# Patient Record
Sex: Female | Born: 1935
Health system: Southern US, Community
[De-identification: ages and names within clinical notes are randomized; demographics above are authoritative.]

## PROBLEM LIST (undated history)

## (undated) DIAGNOSIS — E7800 Pure hypercholesterolemia, unspecified: Secondary | ICD-10-CM

## (undated) DIAGNOSIS — J329 Chronic sinusitis, unspecified: Secondary | ICD-10-CM

## (undated) DIAGNOSIS — M549 Dorsalgia, unspecified: Secondary | ICD-10-CM

## (undated) DIAGNOSIS — Z87898 Personal history of other specified conditions: Secondary | ICD-10-CM

## (undated) DIAGNOSIS — G43909 Migraine, unspecified, not intractable, without status migrainosus: Secondary | ICD-10-CM

## (undated) DIAGNOSIS — M810 Age-related osteoporosis without current pathological fracture: Secondary | ICD-10-CM

## (undated) DIAGNOSIS — T7840XA Allergy, unspecified, initial encounter: Secondary | ICD-10-CM

## (undated) DIAGNOSIS — G8929 Other chronic pain: Secondary | ICD-10-CM

## (undated) DIAGNOSIS — F419 Anxiety disorder, unspecified: Secondary | ICD-10-CM

## (undated) DIAGNOSIS — I10 Essential (primary) hypertension: Secondary | ICD-10-CM

## (undated) DIAGNOSIS — J189 Pneumonia, unspecified organism: Secondary | ICD-10-CM

## (undated) HISTORY — PX: TONSILLECTOMY: SUR1361

## (undated) HISTORY — PX: HEMORRHOID SURGERY: SHX153

## (undated) HISTORY — PX: APPENDECTOMY: SHX54

## (undated) HISTORY — DX: Chronic sinusitis, unspecified: J32.9

## (undated) HISTORY — PX: ABDOMINAL HYSTERECTOMY: SHX81

## (undated) HISTORY — DX: Age-related osteoporosis without current pathological fracture: M81.0

## (undated) HISTORY — DX: Allergy, unspecified, initial encounter: T78.40XA

## (undated) HISTORY — PX: BACK SURGERY: SHX140

## (undated) HISTORY — DX: Migraine, unspecified, not intractable, without status migrainosus: G43.909

---

## 2000-10-11 HISTORY — PX: BREAST BIOPSY: SHX20

## 2004-11-06 ENCOUNTER — Ambulatory Visit: Payer: Self-pay | Admitting: Internal Medicine

## 2005-08-04 ENCOUNTER — Ambulatory Visit: Payer: Self-pay | Admitting: Internal Medicine

## 2005-11-15 ENCOUNTER — Emergency Department: Payer: Self-pay | Admitting: Emergency Medicine

## 2005-11-15 ENCOUNTER — Other Ambulatory Visit: Payer: Self-pay

## 2006-01-06 ENCOUNTER — Ambulatory Visit: Payer: Self-pay | Admitting: Pain Medicine

## 2006-01-25 ENCOUNTER — Emergency Department: Payer: Self-pay | Admitting: Emergency Medicine

## 2006-02-07 ENCOUNTER — Ambulatory Visit: Payer: Self-pay | Admitting: Pain Medicine

## 2006-03-17 ENCOUNTER — Ambulatory Visit: Payer: Self-pay | Admitting: Pain Medicine

## 2006-03-23 ENCOUNTER — Ambulatory Visit: Payer: Self-pay | Admitting: Pain Medicine

## 2006-05-03 ENCOUNTER — Ambulatory Visit: Payer: Self-pay | Admitting: Pain Medicine

## 2006-06-21 ENCOUNTER — Ambulatory Visit: Payer: Self-pay | Admitting: Unknown Physician Specialty

## 2006-10-11 HISTORY — PX: BACK SURGERY: SHX140

## 2006-10-20 ENCOUNTER — Ambulatory Visit: Payer: Self-pay | Admitting: Internal Medicine

## 2006-10-28 ENCOUNTER — Ambulatory Visit: Payer: Self-pay | Admitting: Internal Medicine

## 2006-10-31 ENCOUNTER — Other Ambulatory Visit: Payer: Self-pay

## 2006-12-06 ENCOUNTER — Ambulatory Visit: Payer: Self-pay | Admitting: Unknown Physician Specialty

## 2007-03-06 ENCOUNTER — Emergency Department: Payer: Self-pay | Admitting: Internal Medicine

## 2007-03-11 ENCOUNTER — Emergency Department: Payer: Self-pay | Admitting: Emergency Medicine

## 2007-03-13 ENCOUNTER — Inpatient Hospital Stay: Payer: Self-pay | Admitting: Internal Medicine

## 2007-03-19 ENCOUNTER — Emergency Department: Payer: Self-pay | Admitting: Emergency Medicine

## 2007-03-24 ENCOUNTER — Ambulatory Visit: Payer: Self-pay | Admitting: Neurosurgery

## 2007-03-29 ENCOUNTER — Inpatient Hospital Stay (HOSPITAL_COMMUNITY): Admission: RE | Admit: 2007-03-29 | Discharge: 2007-04-04 | Payer: Self-pay | Admitting: Neurosurgery

## 2007-04-04 ENCOUNTER — Encounter: Payer: Self-pay | Admitting: Internal Medicine

## 2007-04-11 ENCOUNTER — Encounter: Payer: Self-pay | Admitting: Internal Medicine

## 2007-08-02 ENCOUNTER — Ambulatory Visit: Payer: Self-pay | Admitting: Internal Medicine

## 2007-11-21 ENCOUNTER — Ambulatory Visit: Payer: Self-pay | Admitting: Internal Medicine

## 2007-12-07 ENCOUNTER — Ambulatory Visit: Payer: Self-pay | Admitting: Neurosurgery

## 2008-02-29 ENCOUNTER — Emergency Department: Payer: Self-pay | Admitting: Emergency Medicine

## 2008-02-29 ENCOUNTER — Other Ambulatory Visit: Payer: Self-pay

## 2008-03-02 ENCOUNTER — Emergency Department: Payer: Self-pay | Admitting: Emergency Medicine

## 2008-03-02 ENCOUNTER — Other Ambulatory Visit: Payer: Self-pay

## 2008-03-04 ENCOUNTER — Ambulatory Visit: Payer: Self-pay | Admitting: Emergency Medicine

## 2008-03-13 ENCOUNTER — Inpatient Hospital Stay (HOSPITAL_COMMUNITY): Admission: RE | Admit: 2008-03-13 | Discharge: 2008-03-16 | Payer: Self-pay | Admitting: Neurosurgery

## 2008-05-22 ENCOUNTER — Ambulatory Visit: Payer: Self-pay | Admitting: Internal Medicine

## 2008-12-12 ENCOUNTER — Ambulatory Visit: Payer: Self-pay | Admitting: Internal Medicine

## 2009-03-03 ENCOUNTER — Ambulatory Visit: Payer: Self-pay | Admitting: Internal Medicine

## 2009-03-06 ENCOUNTER — Ambulatory Visit: Payer: Self-pay | Admitting: Internal Medicine

## 2009-08-24 ENCOUNTER — Emergency Department: Payer: Self-pay | Admitting: Emergency Medicine

## 2009-09-19 ENCOUNTER — Emergency Department: Payer: Self-pay

## 2009-12-17 ENCOUNTER — Ambulatory Visit: Payer: Self-pay | Admitting: Internal Medicine

## 2010-09-24 ENCOUNTER — Ambulatory Visit: Payer: Self-pay | Admitting: Neurosurgery

## 2010-11-01 ENCOUNTER — Encounter: Payer: Self-pay | Admitting: Neurosurgery

## 2010-12-29 ENCOUNTER — Ambulatory Visit: Payer: Self-pay

## 2011-02-23 NOTE — Op Note (Signed)
NAME:  [name], [name]              ACCOUNT NO.:  [id number]   MEDICAL RECORD NO.:  [id number]          PATIENT TYPE:  INP   LOCATION:  3315                         FACILITY:  [facility]   PHYSICIAN:  Payton Doughty, M.D.      DATE OF BIRTH:  [birth year]   DATE OF PROCEDURE:  03/13/2008  DATE OF DISCHARGE:                               OPERATIVE REPORT   PREOPERATIVE DIAGNOSIS:  Loosened screws at L3, cutout screw at S1.   POSTOPERATIVE DIAGNOSIS:  Loosened screws at L3, cutout screw at S1.   PROCEDURE:  Revision of L3 and S1 fusion.   DICTATING DOCTOR:  Payton Doughty, MD, Neurosurgery.   ANESTHESIA:  General endotracheal.   PREPARATION:  Prepped and draped with alcohol wipe.   COMPLICATIONS:  None.   ASSISTANT:  Evon Slack.   BODY OF TEXT:  This is a 77 year old lady who was fused a year ago, did  well and has had loosening of screws, taken to the operating room,  smoothly anesthetized and intubated, placed prone on the operating  table.  Following shave, prep and draped in the usual sterile fashion,  the old skin incision was reopened and the hardware dissected free.  Caps were removed as were the rods and each screw inspected  individually, both L3 screws were loose.  They were four 75 x 40 screws.  The old screws were removed and the holes were retapped with a larger  screw and also a longer screw and replaced with six 75 x 45 screws.  On  the right-sided S1, the screw had broken into the disk space.  That  screw was removed, new trajectory was planned, and five 75 x 40 screw  was placed there.  They were reconnected to the rods.  The transverse  process of L3-L4 were relocated and first BMP on the extender matrix was  placed bilaterally after decortication.  Final x-ray showed good  placement of screws and rods.  Successive layers of 0 Vicryl, 2-0  Vicryl, and 3-0 nylon were used to close.  Betadine and Telfa dressing  was applied and made occlusive to the OpSite.  The patient  returned to  the recovery room in good condition.           ______________________________  Payton Doughty, M.D.     MWR/MEDQ  D:  03/13/2008  T:  03/14/2008  Job:  [id number]

## 2011-02-23 NOTE — Op Note (Signed)
NAMESEANNE, [name]              ACCOUNT NO.:  [id number]   MEDICAL RECORD NO.:  [id number]          PATIENT TYPE:  INP   LOCATION:  3172                         FACILITY:  [facility]   PHYSICIAN:  Payton Doughty, M.D.      DATE OF BIRTH:  01-02-1925   DATE OF PROCEDURE:  03/29/2007  DATE OF DISCHARGE:                               OPERATIVE REPORT   PREOPERATIVE DIAGNOSIS:  Spondylosis, right L4-5, L4-L5 radiculopathy.   POSTOPERATIVE DIAGNOSIS:  Spondylosis, right L4-5, L4-L5 radiculopathy.   OPERATIVE PROCEDURE:  L3-4, L4-5 laminectomy, facetectomy, L3-S1  segmental pedicle screw fixation and posterolateral arthrodesis.   SURGEON:  Payton Doughty, M.D.   ANESTHESIA:  General endotracheal.   PREP:  Sterile Betadine prep and scrub with alcohol wipe.   COMPLICATIONS:  None.   NURSE ASSISTANT:  [place].   DOCTOR ASSISTANT:  Hilda Lias, M.D.   BODY OF TEXT:  This is a 77 year old lady with severe right L4 and L5  radiculopathies related to spondylosis.  She is taken to operating room,  smoothly anesthetized, intubated, placed prone on the operating table.  Following shave, prep, drape in usual sterile fashion skin was incised  from mid L2-S1 and the lamina and transverse processes of L3, L4, L5 and  the sacral ala were exposed bilaterally in subperiosteal plane.  Intraoperative x-ray confirmed correctness level.  Having confirmed  correctness level, the pars interarticularis, lamina and inferior facet  of L3 and L4 were removed.  Because of the poor quality of the patient's  of bone and desire not to take down something that was not absolutely  necessary for decompression, the lamina of L5 was left largely intact  except on the right side where it was partly taken down in the superior  end to complete the decompression at L4-5 on the right side.  There was  significant nerve root compression at 3-4 and 4-5 much worse on the  right, the most severely affected root was a right L4  root.  Following  complete decompression of the nerve roots, it was not feasible to place  interbody devices because of the patient's anatomy.  Pedicle screws were  placed at L3, L4, L5 and S1.  This was done bilaterally.  Intraoperative  x-ray showed good placement of screws.  The rods contoured to place over  them.  They were contoured and placed on them with a good fit, locked  down with the locking caps.  The final tightener was applied.  The  transverse process of the 3, 4, 5 and the sacral ala were decorticated  with a high-speed drill packed with BMP  on the Actifuse extender matrix.  Final x-ray showed good placement of  screws and rods.  The successive layers of 0-0 Vicryl, 2-0 Vicryl, 3-0  nylon were used to close.  Betadine Telfa dressing was applied and made  occlusive with OpSite.  The patient returned recovery room in good  condition.           ______________________________  Payton Doughty, M.D.     MWR/MEDQ  D:  03/29/2007  T:  03/30/2007  Job:  [id number]

## 2011-02-23 NOTE — H&P (Signed)
[name], [name]              ACCOUNT NO.:  [id number]   MEDICAL RECORD NO.:  [id number]           PATIENT TYPE:   LOCATION:                                 FACILITY:   PHYSICIAN:  Payton Doughty, M.D.      DATE OF BIRTH:  01/12/1933   DATE OF ADMISSION:  03/13/2008  DATE OF DISCHARGE:                              HISTORY & PHYSICAL   CURRENT ADMITTING DIAGNOSIS:  Cut out screw in a fusion.   BODY TEXT:  This is a very nice 77 year old right-handed white lady who  a year ago underwent a fusion from L3-S1, done well, then developed  increasing pain in back and down her right leg.  A CT scanning shows cut  out of a screw at S1 into disk space and some loosening of L3 screws.  She is here for revision.   MEDICAL HISTORY:  Benign.   MEDICATIONS:  She is on:  1. Alprazolam.  2. Prevacid.  3. Evista.  4. Darvocet.  5. Caltrate.   ALLERGIES:  She is allergic to PENICILLIN, SULFA, MORPHINE and DILAUDID  which gives her headache.   SOCIAL HISTORY:  She does not smoke.  She does not drink.  Lives in her  own home.   SURGICAL HISTORY:  1. Hysterectomy in 1966.  2. Hemorrhoids in the remote past.   FAMILY HISTORY:  Not given.   REVIEW OF SYSTEMS:  Remarkable for wearing glasses, hearing aid, nasal  congestion, sinus headache, leg pain, indigestion, gastritis, UTIs,  incontinence, leg weakness, back pain, leg pain, arthritis, neck pain,  anxiety, and depression.   PHYSICAL EXAMINATION:  HEENT: Normal limits.  NECK: She has good range of motion.  CHEST: Clear.  CARDIAC: Regular rate and rhythm with 1/6 murmur.  ABDOMEN: Nontender with no hepatosplenomegaly.  EXTREMITIES: Without clubbing, or cyanosis.  Peripheral pulses are good.  GU: Deferred.  NEUROLOGIC:  She is awake, alert, and oriented.  Cranial nerves are  intact except for hearing loss.  Motor exam shows 5/5 strength  throughout the upper and lower extremities.  Pain described is in the  right S1 distribution.   Reflexes are absent at the knees and ankles.   CT results have been reviewed above.   CLINICAL IMPRESSION:  Displaced screw with the right-sided S1 and in the  left side at L2-L3 there is a pedicle fracture.   PLAN:  For revision and fusion with placement of larger screws at L3  bilaterally and a redirected screw at S1 on the right.  The risks and  benefits have been discussed with her and she wishes to proceed.           ______________________________  Payton Doughty, M.D.     MWR/MEDQ  D:  03/13/2008  T:  03/13/2008  Job:  [id number]

## 2011-02-23 NOTE — H&P (Signed)
NAMEKEYANNI, [name]              ACCOUNT NO.:  [id number]   MEDICAL RECORD NO.:  [id number]          PATIENT TYPE:  INP   LOCATION:  2852                         FACILITY:  [facility]   PHYSICIAN:  Payton Doughty, M.D.      DATE OF BIRTH:  [birth year]   DATE OF ADMISSION:  03/29/2007  DATE OF DISCHARGE:                              HISTORY & PHYSICAL   ADMITTING DIAGNOSIS:  Spondylosis with critical spinal stenosis at L3-4  and L4-5.   This is a very nice 77 year old right-handed white lady who has had back  pain for some time.  Over the past couple months, she had a marked  increase in pain down her right leg.  MR shows severe stenosis at 75/4.  She underwent injection.  It did not help much.  I saw her my office,  and she is now being admitted for laminectomy and diskectomy infusion.   MEDICAL HISTORY:  Benign.   MEDICATIONS:  1. Alprazolam 0.5 mg 2-3 times a day on a p.r.n. basis.  2. Prevacid 30 mg a day.  3. Evista 60 mg a day.  4. Darvocet on a p.r.n. basis.  5. She uses Caltrate and __________ .   ALLERGIES:  1. PENICILLIN.  2. SULFUR.  3. MORPHINE gives her a headache.  4. DILAUDID gives her a headache.   SOCIAL HISTORY:  She does not smoke, does not drink and lives in her own  home.   SURGICAL HISTORY:  1. Hysterectomy in 1966.  2. Hemorrhoids in the remote.   FAMILY HISTORY:  Not given.   REVIEW OF SYSTEMS:  Marked for wearing glasses, hearing aid, nasal  congestion, sinus headache, leg pain, indigestion, gastritis, UTIs,  incontinence, leg weakness, back pain, leg pain, arthritis, neck pain,  anxiety and depression.   PHYSICAL EXAMINATION:  HEENT:  Within normal limits.  NECK:  She has reasonable range of motion.  CHEST:  Clear.  CARDIAC:  Regular rate and rhythm with a 1/6 murmur.  ABDOMEN:  Nontender with no hepatosplenomegaly.  EXTREMITIES:  Without clubbing or cyanosis.  GI:  Deferred.  EXTREMITIES:  Peripheral pulses are good.  NEUROLOGICALLY:  She is  awake, alert and oriented.  Cranial nerves are  intact save for hearing loss.  Motor exam shows 5/5 strength throughout  in the left lower extremity.  In the right lower extremity she has pain-  related weakness in the knee extensors, knee flexors, dorsi and plantar  flexors on the right and straight leg raise very positive on the right.   MR shows spondylolisthesis of 3-4 with severe spinal stenosis; 4-5 is  also very tight; 5-1 is not too back.   CLINICAL IMPRESSION:  Right L4 and right L5 radiculopathy related to  critical spinal stenosis in 3-4.   PLAN:  The plan is for laminectomy, diskectomy, posterior lumbar  __________  fusion and pedicle screws at 3-4, 4-5 and 5-1.  The risks  and benefits have been discussed with her, and she wishes to proceed.   MR shows spondylosis of 304 severe spinal stenosis 04/05 is also retired  05/01 signs of  clinical impression is right L4-L5 radiculopathy related  to critical spinal stenosis 03/04 the plan is for a laminectomy  diskectomy posterior lumbar by fusion with pedicle screws at [id number]  risks and benefits have been discussed with her.  She wishes proceed.           ______________________________  Payton Doughty, M.D.     MWR/MEDQ  D:  03/29/2007  T:  03/29/2007  Job:  [id number]

## 2011-02-23 NOTE — Discharge Summary (Signed)
NAMEESMERELDA, [name]              ACCOUNT NO.:  [id number]   MEDICAL RECORD NO.:  [id number]          PATIENT TYPE:  INP   LOCATION:  3006                         FACILITY:  [facility]   PHYSICIAN:  [name], M.D.      DATE OF BIRTH:  Apr 14, 1925   DATE OF ADMISSION:  03/29/2007  DATE OF DISCHARGE:  04/04/2007                               DISCHARGE SUMMARY   ADMITTING DIAGNOSIS:  Spondylosis L3-4, L4-5.   DISCHARGE DIAGNOSIS:  Spondylosis L3-4, L4-5.   PROCEDURES:  L3-4, L4-5, L5-S1 laminectomy and segmental pedicle screw  fixation and posterolateral arthrodesis.   SURGEON:  Dr. Channing Mutters   COMPLICATIONS:  None.   DISCHARGE STATUS:  Alive and well.   This is a 77 year old right-handed white lady whose history and physical  is recounted in the chart.  She has had over the past couple of months  marked increased pain down her right leg.  MR showed stenosis at 3-4,  injection did not help, and she was admitted for laminectomy and fusion.   MEDICAL HISTORY:  Benign.   MEDICATIONS:  Alprazolam, Prevacid, Evista and Darvocet.   ALLERGIES:  PENICILLIN, SULFA, MORPHINE AND DILAUDID.   SOCIAL HISTORY:  She does not smoke or drink.   SURGICAL HISTORY:  Hysterectomy in 1966, hemorrhoids in the remote past.   FAMILY HISTORY:  Not given.   REVIEW OF SYSTEMS:  Remarkable for back pain and leg pain.   HEENT EXAM:  Within normal limits with reasonable range of motion.  CHEST:  Clear.  CARDIAC EXAM:  Regular rate and rhythm with 1/6 murmur.  ABDOMEN:  Nontender.  No hepatosplenomegaly.  EXTREMITIES:  Without clubbing or cyanosis.  NEUROLOGICALLY:  She is awake, alert and oriented.  Cranial nerves are  intact save for hearing loss.  Motor exam showed 2/5 strength in the  left lower extremity.  In the right lower extremity she had pain-related  weakness.  Knee extensors, knee flexors, dorsal and plantar flexors on  the right and straight leg is very positive on the right.   MR showed  spondylolisthesis 3-4, severe spinal stenosis 4-5 is tight.   CLINICAL IMPRESSION:  Right L4-L5 radiculopathy related to critical  spinal stenosis.   She was admitted after ascertaining normal laboratory values, underwent  a fusion at 3-4, 4-5 and 5-1.  Postoperatively, she has done well with  intact strength.  Says her leg feels better save for a little bit of  right hip pain.  Her incision is dry and well healing.  She has some  trouble getting up and down and getting her brace off and on by herself.  Her Foley is out and she is being discharged to extended-care facility  for further rehab.   On discharge, her medications are:  1. Milk of mag.  2. Colace.  3. Ativan  1 mg t.i.d.  4. Protonix 40 mg a day.  5. Vicodin on a p.r.n. basis.  6. Ciprofloxacin.   Her followup is to be via phone call to my office to arrange for suture  removal next week.  ______________________________  [name], M.D.     MWR/MEDQ  D:  04/03/2007  T:  04/03/2007  Job:  [id number]

## 2011-03-09 ENCOUNTER — Ambulatory Visit: Payer: Self-pay

## 2011-03-11 ENCOUNTER — Ambulatory Visit: Payer: Self-pay

## 2011-04-19 ENCOUNTER — Ambulatory Visit: Payer: Self-pay | Admitting: Surgery

## 2011-04-21 LAB — PATHOLOGY REPORT

## 2011-07-07 LAB — DIFFERENTIAL
Basophils Absolute: 0
Eosinophils Relative: 1
Lymphocytes Relative: 24
Lymphs Abs: 2
Monocytes Absolute: 0.7
Monocytes Relative: 8
Neutro Abs: 5.4

## 2011-07-07 LAB — URINE MICROSCOPIC-ADD ON

## 2011-07-07 LAB — COMPREHENSIVE METABOLIC PANEL
ALT: 22
BUN: 13
CO2: 27
Calcium: 10
Creatinine, Ser: 0.66
GFR calc non Af Amer: >60
Glucose, Bld: 88
Sodium: 133 — ABNORMAL LOW

## 2011-07-07 LAB — CBC
HCT: 33.7 — ABNORMAL LOW
Hemoglobin: 10.9 — ABNORMAL LOW
MCHC: 32.3
MCV: 74.7 — ABNORMAL LOW
RBC: 4.51

## 2011-07-07 LAB — URINALYSIS, ROUTINE W REFLEX MICROSCOPIC
Glucose, UA: NEGATIVE
Ketones, ur: NEGATIVE
Nitrite: NEGATIVE
pH: 7.5

## 2011-07-07 LAB — TYPE AND SCREEN

## 2011-07-07 LAB — APTT: aPTT: 25

## 2011-07-28 LAB — TYPE AND SCREEN
ABO/RH(D): A POS
Antibody Screen: NEGATIVE

## 2011-07-28 LAB — CBC
HCT: 34.1 — ABNORMAL LOW
Hemoglobin: 11 — ABNORMAL LOW
MCV: 72.8 — ABNORMAL LOW
MCV: 74 — ABNORMAL LOW
Platelets: 247
WBC: 11.7 — ABNORMAL HIGH
WBC: 5.8

## 2011-07-28 LAB — URINALYSIS, ROUTINE W REFLEX MICROSCOPIC
Bilirubin Urine: NEGATIVE
Hgb urine dipstick: NEGATIVE
Ketones, ur: NEGATIVE
Protein, ur: NEGATIVE
Urobilinogen, UA: 0.2

## 2011-07-28 LAB — COMPREHENSIVE METABOLIC PANEL
Alkaline Phosphatase: 66
BUN: 9
Chloride: 98
Creatinine, Ser: 0.79
GFR calc non Af Amer: >60
Glucose, Bld: 100 — ABNORMAL HIGH
Potassium: 3.5
Total Bilirubin: 0.5

## 2011-07-28 LAB — ABO/RH: ABO/RH(D): A POS

## 2011-07-28 LAB — APTT: aPTT: 27

## 2011-07-28 LAB — URINE MICROSCOPIC-ADD ON

## 2011-07-28 LAB — PROTIME-INR
INR: 1
Prothrombin Time: 13

## 2011-11-25 DIAGNOSIS — M545 Low back pain: Secondary | ICD-10-CM | POA: Diagnosis not present

## 2011-11-25 DIAGNOSIS — I10 Essential (primary) hypertension: Secondary | ICD-10-CM | POA: Diagnosis not present

## 2011-11-25 DIAGNOSIS — F411 Generalized anxiety disorder: Secondary | ICD-10-CM | POA: Diagnosis not present

## 2011-11-25 DIAGNOSIS — R079 Chest pain, unspecified: Secondary | ICD-10-CM | POA: Diagnosis not present

## 2011-12-09 DIAGNOSIS — M47817 Spondylosis without myelopathy or radiculopathy, lumbosacral region: Secondary | ICD-10-CM | POA: Diagnosis not present

## 2011-12-10 DIAGNOSIS — R079 Chest pain, unspecified: Secondary | ICD-10-CM | POA: Diagnosis not present

## 2011-12-27 DIAGNOSIS — I43 Cardiomyopathy in diseases classified elsewhere: Secondary | ICD-10-CM | POA: Diagnosis not present

## 2011-12-27 DIAGNOSIS — M25579 Pain in unspecified ankle and joints of unspecified foot: Secondary | ICD-10-CM | POA: Diagnosis not present

## 2011-12-27 DIAGNOSIS — R079 Chest pain, unspecified: Secondary | ICD-10-CM | POA: Diagnosis not present

## 2011-12-27 DIAGNOSIS — F411 Generalized anxiety disorder: Secondary | ICD-10-CM | POA: Diagnosis not present

## 2011-12-28 DIAGNOSIS — S52539A Colles' fracture of unspecified radius, initial encounter for closed fracture: Secondary | ICD-10-CM | POA: Diagnosis not present

## 2011-12-28 DIAGNOSIS — L600 Ingrowing nail: Secondary | ICD-10-CM | POA: Diagnosis not present

## 2012-01-18 DIAGNOSIS — I83893 Varicose veins of bilateral lower extremities with other complications: Secondary | ICD-10-CM | POA: Diagnosis not present

## 2012-01-18 DIAGNOSIS — R21 Rash and other nonspecific skin eruption: Secondary | ICD-10-CM | POA: Diagnosis not present

## 2012-01-18 DIAGNOSIS — K219 Gastro-esophageal reflux disease without esophagitis: Secondary | ICD-10-CM | POA: Diagnosis not present

## 2012-01-18 DIAGNOSIS — L03119 Cellulitis of unspecified part of limb: Secondary | ICD-10-CM | POA: Diagnosis not present

## 2012-01-18 DIAGNOSIS — I10 Essential (primary) hypertension: Secondary | ICD-10-CM | POA: Diagnosis not present

## 2012-01-18 DIAGNOSIS — E039 Hypothyroidism, unspecified: Secondary | ICD-10-CM | POA: Diagnosis not present

## 2012-01-18 DIAGNOSIS — F411 Generalized anxiety disorder: Secondary | ICD-10-CM | POA: Diagnosis not present

## 2012-01-18 DIAGNOSIS — L02619 Cutaneous abscess of unspecified foot: Secondary | ICD-10-CM | POA: Diagnosis not present

## 2012-03-16 DIAGNOSIS — M47817 Spondylosis without myelopathy or radiculopathy, lumbosacral region: Secondary | ICD-10-CM | POA: Diagnosis not present

## 2012-03-16 DIAGNOSIS — S22009A Unspecified fracture of unspecified thoracic vertebra, initial encounter for closed fracture: Secondary | ICD-10-CM | POA: Diagnosis not present

## 2012-04-04 DIAGNOSIS — J019 Acute sinusitis, unspecified: Secondary | ICD-10-CM | POA: Diagnosis not present

## 2012-04-04 DIAGNOSIS — R5383 Other fatigue: Secondary | ICD-10-CM | POA: Diagnosis not present

## 2012-04-04 DIAGNOSIS — I10 Essential (primary) hypertension: Secondary | ICD-10-CM | POA: Diagnosis not present

## 2012-04-04 DIAGNOSIS — J309 Allergic rhinitis, unspecified: Secondary | ICD-10-CM | POA: Diagnosis not present

## 2012-04-04 DIAGNOSIS — IMO0001 Reserved for inherently not codable concepts without codable children: Secondary | ICD-10-CM | POA: Diagnosis not present

## 2012-04-04 DIAGNOSIS — N3020 Other chronic cystitis without hematuria: Secondary | ICD-10-CM | POA: Diagnosis not present

## 2012-04-04 DIAGNOSIS — R5381 Other malaise: Secondary | ICD-10-CM | POA: Diagnosis not present

## 2012-04-07 DIAGNOSIS — IMO0001 Reserved for inherently not codable concepts without codable children: Secondary | ICD-10-CM | POA: Diagnosis not present

## 2012-04-07 DIAGNOSIS — R5381 Other malaise: Secondary | ICD-10-CM | POA: Diagnosis not present

## 2012-04-07 DIAGNOSIS — J069 Acute upper respiratory infection, unspecified: Secondary | ICD-10-CM | POA: Diagnosis not present

## 2012-04-07 DIAGNOSIS — R5383 Other fatigue: Secondary | ICD-10-CM | POA: Diagnosis not present

## 2012-04-07 DIAGNOSIS — E039 Hypothyroidism, unspecified: Secondary | ICD-10-CM | POA: Diagnosis not present

## 2012-04-07 DIAGNOSIS — M064 Inflammatory polyarthropathy: Secondary | ICD-10-CM | POA: Diagnosis not present

## 2012-04-07 DIAGNOSIS — D518 Other vitamin B12 deficiency anemias: Secondary | ICD-10-CM | POA: Diagnosis not present

## 2012-04-25 DIAGNOSIS — B009 Herpesviral infection, unspecified: Secondary | ICD-10-CM | POA: Diagnosis not present

## 2012-04-25 DIAGNOSIS — R5381 Other malaise: Secondary | ICD-10-CM | POA: Diagnosis not present

## 2012-04-25 DIAGNOSIS — F411 Generalized anxiety disorder: Secondary | ICD-10-CM | POA: Diagnosis not present

## 2012-04-25 DIAGNOSIS — IMO0001 Reserved for inherently not codable concepts without codable children: Secondary | ICD-10-CM | POA: Diagnosis not present

## 2012-04-25 DIAGNOSIS — I10 Essential (primary) hypertension: Secondary | ICD-10-CM | POA: Diagnosis not present

## 2012-04-27 DIAGNOSIS — R5381 Other malaise: Secondary | ICD-10-CM | POA: Diagnosis not present

## 2012-05-16 ENCOUNTER — Ambulatory Visit: Payer: Self-pay

## 2012-05-16 DIAGNOSIS — Z1231 Encounter for screening mammogram for malignant neoplasm of breast: Secondary | ICD-10-CM | POA: Diagnosis not present

## 2012-05-31 ENCOUNTER — Ambulatory Visit: Payer: Self-pay | Admitting: Internal Medicine

## 2012-05-31 DIAGNOSIS — R609 Edema, unspecified: Secondary | ICD-10-CM | POA: Diagnosis not present

## 2012-05-31 DIAGNOSIS — I739 Peripheral vascular disease, unspecified: Secondary | ICD-10-CM | POA: Diagnosis not present

## 2012-05-31 DIAGNOSIS — L089 Local infection of the skin and subcutaneous tissue, unspecified: Secondary | ICD-10-CM | POA: Diagnosis not present

## 2012-05-31 DIAGNOSIS — M7989 Other specified soft tissue disorders: Secondary | ICD-10-CM | POA: Diagnosis not present

## 2012-05-31 DIAGNOSIS — I83893 Varicose veins of bilateral lower extremities with other complications: Secondary | ICD-10-CM | POA: Diagnosis not present

## 2012-05-31 DIAGNOSIS — M7120 Synovial cyst of popliteal space [Baker], unspecified knee: Secondary | ICD-10-CM | POA: Diagnosis not present

## 2012-05-31 DIAGNOSIS — M79609 Pain in unspecified limb: Secondary | ICD-10-CM | POA: Diagnosis not present

## 2012-06-29 DIAGNOSIS — M47817 Spondylosis without myelopathy or radiculopathy, lumbosacral region: Secondary | ICD-10-CM | POA: Diagnosis not present

## 2012-06-29 DIAGNOSIS — S22009A Unspecified fracture of unspecified thoracic vertebra, initial encounter for closed fracture: Secondary | ICD-10-CM | POA: Diagnosis not present

## 2012-07-14 DIAGNOSIS — H2510 Age-related nuclear cataract, unspecified eye: Secondary | ICD-10-CM | POA: Diagnosis not present

## 2012-08-16 DIAGNOSIS — H2510 Age-related nuclear cataract, unspecified eye: Secondary | ICD-10-CM | POA: Diagnosis not present

## 2012-08-17 DIAGNOSIS — Z23 Encounter for immunization: Secondary | ICD-10-CM | POA: Diagnosis not present

## 2012-08-30 ENCOUNTER — Ambulatory Visit: Payer: Self-pay | Admitting: Ophthalmology

## 2012-08-30 DIAGNOSIS — Z8711 Personal history of peptic ulcer disease: Secondary | ICD-10-CM | POA: Diagnosis not present

## 2012-08-30 DIAGNOSIS — Z87891 Personal history of nicotine dependence: Secondary | ICD-10-CM | POA: Diagnosis not present

## 2012-08-30 DIAGNOSIS — I10 Essential (primary) hypertension: Secondary | ICD-10-CM | POA: Diagnosis not present

## 2012-08-30 DIAGNOSIS — H9190 Unspecified hearing loss, unspecified ear: Secondary | ICD-10-CM | POA: Diagnosis not present

## 2012-08-30 DIAGNOSIS — J00 Acute nasopharyngitis [common cold]: Secondary | ICD-10-CM | POA: Diagnosis not present

## 2012-08-30 DIAGNOSIS — Z885 Allergy status to narcotic agent status: Secondary | ICD-10-CM | POA: Diagnosis not present

## 2012-08-30 DIAGNOSIS — I517 Cardiomegaly: Secondary | ICD-10-CM | POA: Diagnosis not present

## 2012-08-30 DIAGNOSIS — M129 Arthropathy, unspecified: Secondary | ICD-10-CM | POA: Diagnosis not present

## 2012-08-30 DIAGNOSIS — Z8673 Personal history of transient ischemic attack (TIA), and cerebral infarction without residual deficits: Secondary | ICD-10-CM | POA: Diagnosis not present

## 2012-08-30 DIAGNOSIS — D649 Anemia, unspecified: Secondary | ICD-10-CM | POA: Diagnosis not present

## 2012-08-30 DIAGNOSIS — F329 Major depressive disorder, single episode, unspecified: Secondary | ICD-10-CM | POA: Diagnosis not present

## 2012-08-30 DIAGNOSIS — Z79899 Other long term (current) drug therapy: Secondary | ICD-10-CM | POA: Diagnosis not present

## 2012-08-30 DIAGNOSIS — L02419 Cutaneous abscess of limb, unspecified: Secondary | ICD-10-CM | POA: Diagnosis not present

## 2012-08-30 DIAGNOSIS — Z882 Allergy status to sulfonamides status: Secondary | ICD-10-CM | POA: Diagnosis not present

## 2012-08-30 DIAGNOSIS — H2510 Age-related nuclear cataract, unspecified eye: Secondary | ICD-10-CM | POA: Diagnosis not present

## 2012-08-30 DIAGNOSIS — M810 Age-related osteoporosis without current pathological fracture: Secondary | ICD-10-CM | POA: Diagnosis not present

## 2012-08-30 DIAGNOSIS — K219 Gastro-esophageal reflux disease without esophagitis: Secondary | ICD-10-CM | POA: Diagnosis not present

## 2012-08-30 DIAGNOSIS — R011 Cardiac murmur, unspecified: Secondary | ICD-10-CM | POA: Diagnosis not present

## 2012-08-30 DIAGNOSIS — M4800 Spinal stenosis, site unspecified: Secondary | ICD-10-CM | POA: Diagnosis not present

## 2012-10-09 DIAGNOSIS — IMO0001 Reserved for inherently not codable concepts without codable children: Secondary | ICD-10-CM | POA: Diagnosis not present

## 2012-10-09 DIAGNOSIS — R300 Dysuria: Secondary | ICD-10-CM | POA: Diagnosis not present

## 2012-10-09 DIAGNOSIS — E039 Hypothyroidism, unspecified: Secondary | ICD-10-CM | POA: Diagnosis not present

## 2012-10-09 DIAGNOSIS — L03119 Cellulitis of unspecified part of limb: Secondary | ICD-10-CM | POA: Diagnosis not present

## 2012-10-09 DIAGNOSIS — R5381 Other malaise: Secondary | ICD-10-CM | POA: Diagnosis not present

## 2012-10-09 DIAGNOSIS — N390 Urinary tract infection, site not specified: Secondary | ICD-10-CM | POA: Diagnosis not present

## 2012-10-09 DIAGNOSIS — L988 Other specified disorders of the skin and subcutaneous tissue: Secondary | ICD-10-CM | POA: Diagnosis not present

## 2012-10-16 DIAGNOSIS — M47817 Spondylosis without myelopathy or radiculopathy, lumbosacral region: Secondary | ICD-10-CM | POA: Diagnosis not present

## 2012-10-16 DIAGNOSIS — S22009A Unspecified fracture of unspecified thoracic vertebra, initial encounter for closed fracture: Secondary | ICD-10-CM | POA: Diagnosis not present

## 2012-10-23 DIAGNOSIS — I803 Phlebitis and thrombophlebitis of lower extremities, unspecified: Secondary | ICD-10-CM | POA: Diagnosis not present

## 2012-10-23 DIAGNOSIS — IMO0002 Reserved for concepts with insufficient information to code with codable children: Secondary | ICD-10-CM | POA: Diagnosis not present

## 2012-10-23 DIAGNOSIS — N390 Urinary tract infection, site not specified: Secondary | ICD-10-CM | POA: Diagnosis not present

## 2012-10-23 DIAGNOSIS — I83893 Varicose veins of bilateral lower extremities with other complications: Secondary | ICD-10-CM | POA: Diagnosis not present

## 2012-10-23 DIAGNOSIS — R5381 Other malaise: Secondary | ICD-10-CM | POA: Diagnosis not present

## 2012-10-23 DIAGNOSIS — F411 Generalized anxiety disorder: Secondary | ICD-10-CM | POA: Diagnosis not present

## 2012-10-23 DIAGNOSIS — R262 Difficulty in walking, not elsewhere classified: Secondary | ICD-10-CM | POA: Diagnosis not present

## 2012-11-27 DIAGNOSIS — R1084 Generalized abdominal pain: Secondary | ICD-10-CM | POA: Diagnosis not present

## 2012-11-27 DIAGNOSIS — R5383 Other fatigue: Secondary | ICD-10-CM | POA: Diagnosis not present

## 2012-11-27 DIAGNOSIS — R5381 Other malaise: Secondary | ICD-10-CM | POA: Diagnosis not present

## 2012-11-27 DIAGNOSIS — L988 Other specified disorders of the skin and subcutaneous tissue: Secondary | ICD-10-CM | POA: Diagnosis not present

## 2012-11-27 DIAGNOSIS — I803 Phlebitis and thrombophlebitis of lower extremities, unspecified: Secondary | ICD-10-CM | POA: Diagnosis not present

## 2012-11-27 DIAGNOSIS — R300 Dysuria: Secondary | ICD-10-CM | POA: Diagnosis not present

## 2012-11-27 DIAGNOSIS — K5909 Other constipation: Secondary | ICD-10-CM | POA: Diagnosis not present

## 2012-12-01 ENCOUNTER — Ambulatory Visit: Payer: Self-pay | Admitting: Internal Medicine

## 2012-12-01 DIAGNOSIS — R1012 Left upper quadrant pain: Secondary | ICD-10-CM | POA: Diagnosis not present

## 2012-12-01 DIAGNOSIS — R109 Unspecified abdominal pain: Secondary | ICD-10-CM | POA: Diagnosis not present

## 2012-12-08 DIAGNOSIS — I83893 Varicose veins of bilateral lower extremities with other complications: Secondary | ICD-10-CM | POA: Diagnosis not present

## 2012-12-08 DIAGNOSIS — Z006 Encounter for examination for normal comparison and control in clinical research program: Secondary | ICD-10-CM | POA: Diagnosis not present

## 2012-12-08 DIAGNOSIS — E039 Hypothyroidism, unspecified: Secondary | ICD-10-CM | POA: Diagnosis not present

## 2012-12-08 DIAGNOSIS — K5909 Other constipation: Secondary | ICD-10-CM | POA: Diagnosis not present

## 2012-12-08 DIAGNOSIS — I10 Essential (primary) hypertension: Secondary | ICD-10-CM | POA: Diagnosis not present

## 2012-12-08 DIAGNOSIS — R1084 Generalized abdominal pain: Secondary | ICD-10-CM | POA: Diagnosis not present

## 2012-12-08 DIAGNOSIS — I803 Phlebitis and thrombophlebitis of lower extremities, unspecified: Secondary | ICD-10-CM | POA: Diagnosis not present

## 2013-01-03 DIAGNOSIS — M25869 Other specified joint disorders, unspecified knee: Secondary | ICD-10-CM | POA: Diagnosis not present

## 2013-01-03 DIAGNOSIS — S22009A Unspecified fracture of unspecified thoracic vertebra, initial encounter for closed fracture: Secondary | ICD-10-CM | POA: Diagnosis not present

## 2013-01-03 DIAGNOSIS — M47817 Spondylosis without myelopathy or radiculopathy, lumbosacral region: Secondary | ICD-10-CM | POA: Diagnosis not present

## 2013-01-27 ENCOUNTER — Ambulatory Visit: Payer: Self-pay | Admitting: Neurosurgery

## 2013-01-27 DIAGNOSIS — M5137 Other intervertebral disc degeneration, lumbosacral region: Secondary | ICD-10-CM | POA: Diagnosis not present

## 2013-01-27 DIAGNOSIS — S83289A Other tear of lateral meniscus, current injury, unspecified knee, initial encounter: Secondary | ICD-10-CM | POA: Diagnosis not present

## 2013-01-27 DIAGNOSIS — M549 Dorsalgia, unspecified: Secondary | ICD-10-CM | POA: Diagnosis not present

## 2013-01-27 DIAGNOSIS — M79609 Pain in unspecified limb: Secondary | ICD-10-CM | POA: Diagnosis not present

## 2013-01-27 DIAGNOSIS — IMO0002 Reserved for concepts with insufficient information to code with codable children: Secondary | ICD-10-CM | POA: Diagnosis not present

## 2013-01-27 DIAGNOSIS — Z9889 Other specified postprocedural states: Secondary | ICD-10-CM | POA: Diagnosis not present

## 2013-02-07 DIAGNOSIS — M25869 Other specified joint disorders, unspecified knee: Secondary | ICD-10-CM | POA: Diagnosis not present

## 2013-02-07 DIAGNOSIS — M47817 Spondylosis without myelopathy or radiculopathy, lumbosacral region: Secondary | ICD-10-CM | POA: Diagnosis not present

## 2013-02-13 DIAGNOSIS — M1710 Unilateral primary osteoarthritis, unspecified knee: Secondary | ICD-10-CM | POA: Diagnosis not present

## 2013-02-22 DIAGNOSIS — H6690 Otitis media, unspecified, unspecified ear: Secondary | ICD-10-CM | POA: Diagnosis not present

## 2013-02-22 DIAGNOSIS — B370 Candidal stomatitis: Secondary | ICD-10-CM | POA: Diagnosis not present

## 2013-02-22 DIAGNOSIS — J309 Allergic rhinitis, unspecified: Secondary | ICD-10-CM | POA: Diagnosis not present

## 2013-02-22 DIAGNOSIS — I83893 Varicose veins of bilateral lower extremities with other complications: Secondary | ICD-10-CM | POA: Diagnosis not present

## 2013-02-22 DIAGNOSIS — I739 Peripheral vascular disease, unspecified: Secondary | ICD-10-CM | POA: Diagnosis not present

## 2013-03-07 DIAGNOSIS — H8309 Labyrinthitis, unspecified ear: Secondary | ICD-10-CM | POA: Diagnosis not present

## 2013-03-07 DIAGNOSIS — R51 Headache: Secondary | ICD-10-CM | POA: Diagnosis not present

## 2013-03-07 DIAGNOSIS — R42 Dizziness and giddiness: Secondary | ICD-10-CM | POA: Diagnosis not present

## 2013-03-13 DIAGNOSIS — R51 Headache: Secondary | ICD-10-CM | POA: Diagnosis not present

## 2013-03-13 DIAGNOSIS — R42 Dizziness and giddiness: Secondary | ICD-10-CM | POA: Diagnosis not present

## 2013-03-13 DIAGNOSIS — R209 Unspecified disturbances of skin sensation: Secondary | ICD-10-CM | POA: Diagnosis not present

## 2013-03-13 DIAGNOSIS — J342 Deviated nasal septum: Secondary | ICD-10-CM | POA: Diagnosis not present

## 2013-03-26 ENCOUNTER — Ambulatory Visit: Payer: Self-pay | Admitting: Otolaryngology

## 2013-03-26 DIAGNOSIS — R42 Dizziness and giddiness: Secondary | ICD-10-CM | POA: Diagnosis not present

## 2013-03-26 DIAGNOSIS — R51 Headache: Secondary | ICD-10-CM | POA: Diagnosis not present

## 2013-03-26 DIAGNOSIS — R55 Syncope and collapse: Secondary | ICD-10-CM | POA: Diagnosis not present

## 2013-03-26 LAB — CREATININE, SERUM: EGFR (African American): >60

## 2013-04-17 DIAGNOSIS — I83893 Varicose veins of bilateral lower extremities with other complications: Secondary | ICD-10-CM | POA: Diagnosis not present

## 2013-04-17 DIAGNOSIS — L03119 Cellulitis of unspecified part of limb: Secondary | ICD-10-CM | POA: Diagnosis not present

## 2013-04-17 DIAGNOSIS — J069 Acute upper respiratory infection, unspecified: Secondary | ICD-10-CM | POA: Diagnosis not present

## 2013-04-17 DIAGNOSIS — R5383 Other fatigue: Secondary | ICD-10-CM | POA: Diagnosis not present

## 2013-04-17 DIAGNOSIS — R5381 Other malaise: Secondary | ICD-10-CM | POA: Diagnosis not present

## 2013-04-17 DIAGNOSIS — IMO0002 Reserved for concepts with insufficient information to code with codable children: Secondary | ICD-10-CM | POA: Diagnosis not present

## 2013-04-17 DIAGNOSIS — M545 Low back pain: Secondary | ICD-10-CM | POA: Diagnosis not present

## 2013-05-24 DIAGNOSIS — S22009A Unspecified fracture of unspecified thoracic vertebra, initial encounter for closed fracture: Secondary | ICD-10-CM | POA: Diagnosis not present

## 2013-05-24 DIAGNOSIS — M25869 Other specified joint disorders, unspecified knee: Secondary | ICD-10-CM | POA: Diagnosis not present

## 2013-05-24 DIAGNOSIS — M47817 Spondylosis without myelopathy or radiculopathy, lumbosacral region: Secondary | ICD-10-CM | POA: Diagnosis not present

## 2013-06-19 DIAGNOSIS — N390 Urinary tract infection, site not specified: Secondary | ICD-10-CM | POA: Diagnosis not present

## 2013-06-27 ENCOUNTER — Ambulatory Visit: Payer: Self-pay

## 2013-06-27 DIAGNOSIS — Z1231 Encounter for screening mammogram for malignant neoplasm of breast: Secondary | ICD-10-CM | POA: Diagnosis not present

## 2013-07-02 ENCOUNTER — Emergency Department: Payer: Self-pay | Admitting: Emergency Medicine

## 2013-07-02 DIAGNOSIS — R339 Retention of urine, unspecified: Secondary | ICD-10-CM | POA: Diagnosis not present

## 2013-07-02 DIAGNOSIS — Z9889 Other specified postprocedural states: Secondary | ICD-10-CM | POA: Diagnosis not present

## 2013-07-02 DIAGNOSIS — Z9089 Acquired absence of other organs: Secondary | ICD-10-CM | POA: Diagnosis not present

## 2013-07-02 DIAGNOSIS — Z9079 Acquired absence of other genital organ(s): Secondary | ICD-10-CM | POA: Diagnosis not present

## 2013-07-02 LAB — CBC WITH DIFFERENTIAL/PLATELET
Basophil #: 0.1 10*3/uL (ref 0.0–0.1)
Eosinophil %: 2.6 %
HCT: 42.7 % (ref 35.0–47.0)
MCH: 29 pg (ref 26.0–34.0)
MCHC: 33.4 g/dL (ref 32.0–36.0)
Neutrophil #: 4.3 10*3/uL (ref 1.4–6.5)
Platelet: 187 10*3/uL (ref 150–440)
RBC: 4.92 10*6/uL (ref 3.80–5.20)
RDW: 13.9 % (ref 11.5–14.5)
WBC: 7.3 10*3/uL (ref 3.6–11.0)

## 2013-07-02 LAB — URINALYSIS, COMPLETE
Bacteria: NEGATIVE
Glucose,UR: NEGATIVE mg/dL (ref 0–75)
Ketone: NEGATIVE
Nitrite: NEGATIVE
Protein: NEGATIVE
WBC UR: NONE SEEN /HPF (ref 0–5)

## 2013-07-02 LAB — COMPREHENSIVE METABOLIC PANEL
BUN: 12 mg/dL (ref 7–18)
Calcium, Total: 9.9 mg/dL (ref 8.5–10.1)
Chloride: 105 mmol/L (ref 98–107)
Co2: 29 mmol/L (ref 21–32)
Creatinine: 0.79 mg/dL (ref 0.60–1.30)
EGFR (African American): >60
EGFR (Non-African Amer.): >60
Glucose: 92 mg/dL (ref 65–99)
Potassium: 4.2 mmol/L (ref 3.5–5.1)

## 2013-07-09 DIAGNOSIS — R339 Retention of urine, unspecified: Secondary | ICD-10-CM | POA: Diagnosis not present

## 2013-07-16 DIAGNOSIS — R339 Retention of urine, unspecified: Secondary | ICD-10-CM | POA: Diagnosis not present

## 2013-07-17 DIAGNOSIS — I10 Essential (primary) hypertension: Secondary | ICD-10-CM | POA: Diagnosis not present

## 2013-07-17 DIAGNOSIS — D518 Other vitamin B12 deficiency anemias: Secondary | ICD-10-CM | POA: Diagnosis not present

## 2013-07-17 DIAGNOSIS — E782 Mixed hyperlipidemia: Secondary | ICD-10-CM | POA: Diagnosis not present

## 2013-07-17 DIAGNOSIS — E559 Vitamin D deficiency, unspecified: Secondary | ICD-10-CM | POA: Diagnosis not present

## 2013-07-17 DIAGNOSIS — N320 Bladder-neck obstruction: Secondary | ICD-10-CM | POA: Diagnosis not present

## 2013-07-17 DIAGNOSIS — R339 Retention of urine, unspecified: Secondary | ICD-10-CM | POA: Diagnosis not present

## 2013-08-15 DIAGNOSIS — M47817 Spondylosis without myelopathy or radiculopathy, lumbosacral region: Secondary | ICD-10-CM | POA: Diagnosis not present

## 2013-08-15 DIAGNOSIS — M25869 Other specified joint disorders, unspecified knee: Secondary | ICD-10-CM | POA: Diagnosis not present

## 2013-08-15 DIAGNOSIS — S22009A Unspecified fracture of unspecified thoracic vertebra, initial encounter for closed fracture: Secondary | ICD-10-CM | POA: Diagnosis not present

## 2013-08-15 DIAGNOSIS — S93409A Sprain of unspecified ligament of unspecified ankle, initial encounter: Secondary | ICD-10-CM | POA: Diagnosis not present

## 2013-08-21 DIAGNOSIS — Z23 Encounter for immunization: Secondary | ICD-10-CM | POA: Diagnosis not present

## 2013-08-31 DIAGNOSIS — R5381 Other malaise: Secondary | ICD-10-CM | POA: Diagnosis not present

## 2013-08-31 DIAGNOSIS — M25569 Pain in unspecified knee: Secondary | ICD-10-CM | POA: Diagnosis not present

## 2013-08-31 DIAGNOSIS — M25579 Pain in unspecified ankle and joints of unspecified foot: Secondary | ICD-10-CM | POA: Diagnosis not present

## 2013-08-31 DIAGNOSIS — Z79899 Other long term (current) drug therapy: Secondary | ICD-10-CM | POA: Diagnosis not present

## 2013-09-17 DIAGNOSIS — I059 Rheumatic mitral valve disease, unspecified: Secondary | ICD-10-CM | POA: Diagnosis not present

## 2013-09-17 DIAGNOSIS — I739 Peripheral vascular disease, unspecified: Secondary | ICD-10-CM | POA: Diagnosis not present

## 2013-09-17 DIAGNOSIS — N3946 Mixed incontinence: Secondary | ICD-10-CM | POA: Diagnosis not present

## 2013-09-17 DIAGNOSIS — R262 Difficulty in walking, not elsewhere classified: Secondary | ICD-10-CM | POA: Diagnosis not present

## 2013-09-17 DIAGNOSIS — I83893 Varicose veins of bilateral lower extremities with other complications: Secondary | ICD-10-CM | POA: Diagnosis not present

## 2013-09-17 DIAGNOSIS — M159 Polyosteoarthritis, unspecified: Secondary | ICD-10-CM | POA: Diagnosis not present

## 2013-09-17 DIAGNOSIS — M810 Age-related osteoporosis without current pathological fracture: Secondary | ICD-10-CM | POA: Diagnosis not present

## 2013-09-17 DIAGNOSIS — R5381 Other malaise: Secondary | ICD-10-CM | POA: Diagnosis not present

## 2013-09-18 DIAGNOSIS — H3530 Unspecified macular degeneration: Secondary | ICD-10-CM | POA: Diagnosis not present

## 2013-09-18 DIAGNOSIS — H524 Presbyopia: Secondary | ICD-10-CM | POA: Diagnosis not present

## 2013-09-18 DIAGNOSIS — H40009 Preglaucoma, unspecified, unspecified eye: Secondary | ICD-10-CM | POA: Diagnosis not present

## 2013-09-27 DIAGNOSIS — H3530 Unspecified macular degeneration: Secondary | ICD-10-CM | POA: Diagnosis not present

## 2013-09-27 DIAGNOSIS — H40009 Preglaucoma, unspecified, unspecified eye: Secondary | ICD-10-CM | POA: Diagnosis not present

## 2013-10-15 DIAGNOSIS — I359 Nonrheumatic aortic valve disorder, unspecified: Secondary | ICD-10-CM | POA: Diagnosis not present

## 2013-10-15 DIAGNOSIS — R011 Cardiac murmur, unspecified: Secondary | ICD-10-CM | POA: Diagnosis not present

## 2013-10-23 ENCOUNTER — Ambulatory Visit: Payer: Self-pay | Admitting: Orthopaedic Surgery

## 2013-10-23 DIAGNOSIS — M7120 Synovial cyst of popliteal space [Baker], unspecified knee: Secondary | ICD-10-CM | POA: Diagnosis not present

## 2013-10-23 DIAGNOSIS — S99919A Unspecified injury of unspecified ankle, initial encounter: Secondary | ICD-10-CM | POA: Diagnosis not present

## 2013-10-23 DIAGNOSIS — M79609 Pain in unspecified limb: Secondary | ICD-10-CM | POA: Diagnosis not present

## 2013-10-23 DIAGNOSIS — S8990XA Unspecified injury of unspecified lower leg, initial encounter: Secondary | ICD-10-CM | POA: Diagnosis not present

## 2013-10-23 DIAGNOSIS — R609 Edema, unspecified: Secondary | ICD-10-CM | POA: Diagnosis not present

## 2013-10-25 DIAGNOSIS — IMO0001 Reserved for inherently not codable concepts without codable children: Secondary | ICD-10-CM | POA: Diagnosis not present

## 2013-10-25 DIAGNOSIS — M545 Low back pain, unspecified: Secondary | ICD-10-CM | POA: Diagnosis not present

## 2013-10-25 DIAGNOSIS — I10 Essential (primary) hypertension: Secondary | ICD-10-CM | POA: Diagnosis not present

## 2013-10-25 DIAGNOSIS — I803 Phlebitis and thrombophlebitis of lower extremities, unspecified: Secondary | ICD-10-CM | POA: Diagnosis not present

## 2013-10-25 DIAGNOSIS — M79609 Pain in unspecified limb: Secondary | ICD-10-CM | POA: Diagnosis not present

## 2013-10-25 DIAGNOSIS — E039 Hypothyroidism, unspecified: Secondary | ICD-10-CM | POA: Diagnosis not present

## 2013-10-25 DIAGNOSIS — I83893 Varicose veins of bilateral lower extremities with other complications: Secondary | ICD-10-CM | POA: Diagnosis not present

## 2013-10-25 DIAGNOSIS — R5381 Other malaise: Secondary | ICD-10-CM | POA: Diagnosis not present

## 2013-12-05 DIAGNOSIS — R51 Headache: Secondary | ICD-10-CM | POA: Diagnosis not present

## 2013-12-05 DIAGNOSIS — J018 Other acute sinusitis: Secondary | ICD-10-CM | POA: Diagnosis not present

## 2013-12-10 DIAGNOSIS — M25569 Pain in unspecified knee: Secondary | ICD-10-CM | POA: Diagnosis not present

## 2013-12-17 ENCOUNTER — Emergency Department: Payer: Self-pay | Admitting: Emergency Medicine

## 2013-12-17 DIAGNOSIS — Z880 Allergy status to penicillin: Secondary | ICD-10-CM | POA: Diagnosis not present

## 2013-12-17 DIAGNOSIS — T783XXA Angioneurotic edema, initial encounter: Secondary | ICD-10-CM | POA: Diagnosis not present

## 2013-12-17 DIAGNOSIS — K130 Diseases of lips: Secondary | ICD-10-CM | POA: Diagnosis not present

## 2013-12-17 DIAGNOSIS — Z79899 Other long term (current) drug therapy: Secondary | ICD-10-CM | POA: Diagnosis not present

## 2013-12-17 DIAGNOSIS — Z888 Allergy status to other drugs, medicaments and biological substances status: Secondary | ICD-10-CM | POA: Diagnosis not present

## 2013-12-18 DIAGNOSIS — R0602 Shortness of breath: Secondary | ICD-10-CM | POA: Diagnosis not present

## 2013-12-19 ENCOUNTER — Emergency Department: Payer: Self-pay | Admitting: Emergency Medicine

## 2013-12-19 DIAGNOSIS — Z79899 Other long term (current) drug therapy: Secondary | ICD-10-CM | POA: Diagnosis not present

## 2013-12-19 DIAGNOSIS — R220 Localized swelling, mass and lump, head: Secondary | ICD-10-CM | POA: Diagnosis not present

## 2013-12-19 DIAGNOSIS — R0602 Shortness of breath: Secondary | ICD-10-CM | POA: Diagnosis not present

## 2013-12-19 DIAGNOSIS — I10 Essential (primary) hypertension: Secondary | ICD-10-CM | POA: Diagnosis not present

## 2013-12-19 DIAGNOSIS — L509 Urticaria, unspecified: Secondary | ICD-10-CM | POA: Diagnosis not present

## 2013-12-19 DIAGNOSIS — G8929 Other chronic pain: Secondary | ICD-10-CM | POA: Diagnosis not present

## 2013-12-19 DIAGNOSIS — R221 Localized swelling, mass and lump, neck: Secondary | ICD-10-CM | POA: Diagnosis not present

## 2013-12-19 LAB — URINALYSIS, COMPLETE
Bacteria: NONE SEEN
Bilirubin,UR: NEGATIVE
Blood: NEGATIVE
GLUCOSE, UR: NEGATIVE mg/dL (ref 0–75)
KETONE: NEGATIVE
LEUKOCYTE ESTERASE: NEGATIVE
Nitrite: NEGATIVE
PH: 7 (ref 4.5–8.0)
Protein: NEGATIVE
RBC,UR: NONE SEEN /HPF (ref 0–5)
Specific Gravity: 1.003 (ref 1.003–1.030)

## 2013-12-19 LAB — CBC
HCT: 41.3 % (ref 35.0–47.0)
HGB: 13.4 g/dL (ref 12.0–16.0)
MCH: 27.8 pg (ref 26.0–34.0)
MCHC: 32.6 g/dL (ref 32.0–36.0)
MCV: 85 fL (ref 80–100)
PLATELETS: 228 10*3/uL (ref 150–440)
RBC: 4.83 10*6/uL (ref 3.80–5.20)
RDW: 13.5 % (ref 11.5–14.5)
WBC: 16.4 10*3/uL — AB (ref 3.6–11.0)

## 2013-12-19 LAB — BASIC METABOLIC PANEL
ANION GAP: 6 — AB (ref 7–16)
BUN: 15 mg/dL (ref 7–18)
CALCIUM: 9.6 mg/dL (ref 8.5–10.1)
CO2: 27 mmol/L (ref 21–32)
Chloride: 100 mmol/L (ref 98–107)
Creatinine: 0.69 mg/dL (ref 0.60–1.30)
GLUCOSE: 84 mg/dL (ref 65–99)
Osmolality: 266 (ref 275–301)
POTASSIUM: 4.1 mmol/L (ref 3.5–5.1)
SODIUM: 133 mmol/L — AB (ref 136–145)

## 2013-12-19 LAB — TROPONIN I

## 2013-12-25 DIAGNOSIS — F411 Generalized anxiety disorder: Secondary | ICD-10-CM | POA: Diagnosis not present

## 2013-12-25 DIAGNOSIS — R5383 Other fatigue: Secondary | ICD-10-CM | POA: Diagnosis not present

## 2013-12-25 DIAGNOSIS — I10 Essential (primary) hypertension: Secondary | ICD-10-CM | POA: Diagnosis not present

## 2013-12-25 DIAGNOSIS — R079 Chest pain, unspecified: Secondary | ICD-10-CM | POA: Diagnosis not present

## 2013-12-25 DIAGNOSIS — R0602 Shortness of breath: Secondary | ICD-10-CM | POA: Diagnosis not present

## 2013-12-25 DIAGNOSIS — R5381 Other malaise: Secondary | ICD-10-CM | POA: Diagnosis not present

## 2013-12-25 DIAGNOSIS — E039 Hypothyroidism, unspecified: Secondary | ICD-10-CM | POA: Diagnosis not present

## 2013-12-28 DIAGNOSIS — J342 Deviated nasal septum: Secondary | ICD-10-CM | POA: Diagnosis not present

## 2013-12-28 DIAGNOSIS — R490 Dysphonia: Secondary | ICD-10-CM | POA: Diagnosis not present

## 2013-12-28 DIAGNOSIS — J3489 Other specified disorders of nose and nasal sinuses: Secondary | ICD-10-CM | POA: Diagnosis not present

## 2014-01-07 DIAGNOSIS — M47817 Spondylosis without myelopathy or radiculopathy, lumbosacral region: Secondary | ICD-10-CM | POA: Diagnosis not present

## 2014-01-07 DIAGNOSIS — S22009A Unspecified fracture of unspecified thoracic vertebra, initial encounter for closed fracture: Secondary | ICD-10-CM | POA: Diagnosis not present

## 2014-01-07 DIAGNOSIS — M25869 Other specified joint disorders, unspecified knee: Secondary | ICD-10-CM | POA: Diagnosis not present

## 2014-01-08 DIAGNOSIS — L03211 Cellulitis of face: Secondary | ICD-10-CM | POA: Diagnosis not present

## 2014-01-08 DIAGNOSIS — L0201 Cutaneous abscess of face: Secondary | ICD-10-CM | POA: Diagnosis not present

## 2014-01-21 DIAGNOSIS — M25569 Pain in unspecified knee: Secondary | ICD-10-CM | POA: Diagnosis not present

## 2014-01-31 DIAGNOSIS — H3530 Unspecified macular degeneration: Secondary | ICD-10-CM | POA: Diagnosis not present

## 2014-01-31 DIAGNOSIS — H401290 Low-tension glaucoma, unspecified eye, stage unspecified: Secondary | ICD-10-CM | POA: Diagnosis not present

## 2014-02-13 ENCOUNTER — Emergency Department: Payer: Self-pay | Admitting: Emergency Medicine

## 2014-02-13 DIAGNOSIS — Z888 Allergy status to other drugs, medicaments and biological substances status: Secondary | ICD-10-CM | POA: Diagnosis not present

## 2014-02-13 DIAGNOSIS — Z9089 Acquired absence of other organs: Secondary | ICD-10-CM | POA: Diagnosis not present

## 2014-02-13 DIAGNOSIS — Z79899 Other long term (current) drug therapy: Secondary | ICD-10-CM | POA: Diagnosis not present

## 2014-02-13 DIAGNOSIS — R141 Gas pain: Secondary | ICD-10-CM | POA: Diagnosis not present

## 2014-02-13 DIAGNOSIS — K5900 Constipation, unspecified: Secondary | ICD-10-CM | POA: Diagnosis not present

## 2014-02-13 DIAGNOSIS — R110 Nausea: Secondary | ICD-10-CM | POA: Diagnosis not present

## 2014-02-13 DIAGNOSIS — R109 Unspecified abdominal pain: Secondary | ICD-10-CM | POA: Diagnosis not present

## 2014-02-13 DIAGNOSIS — Z9889 Other specified postprocedural states: Secondary | ICD-10-CM | POA: Diagnosis not present

## 2014-02-13 DIAGNOSIS — R197 Diarrhea, unspecified: Secondary | ICD-10-CM | POA: Diagnosis not present

## 2014-02-13 DIAGNOSIS — Z9079 Acquired absence of other genital organ(s): Secondary | ICD-10-CM | POA: Diagnosis not present

## 2014-02-13 LAB — COMPREHENSIVE METABOLIC PANEL
ANION GAP: 6 — AB (ref 7–16)
AST: 20 U/L (ref 15–37)
Albumin: 3.9 g/dL (ref 3.4–5.0)
Alkaline Phosphatase: 66 U/L
BILIRUBIN TOTAL: 0.7 mg/dL (ref 0.2–1.0)
BUN: 9 mg/dL (ref 7–18)
CHLORIDE: 100 mmol/L (ref 98–107)
CO2: 28 mmol/L (ref 21–32)
Calcium, Total: 9.4 mg/dL (ref 8.5–10.1)
Creatinine: 0.56 mg/dL — ABNORMAL LOW (ref 0.60–1.30)
EGFR (African American): >60
EGFR (Non-African Amer.): >60
Glucose: 97 mg/dL (ref 65–99)
OSMOLALITY: 267 (ref 275–301)
POTASSIUM: 4.3 mmol/L (ref 3.5–5.1)
SGPT (ALT): 22 U/L (ref 12–78)
SODIUM: 134 mmol/L — AB (ref 136–145)
Total Protein: 7.4 g/dL (ref 6.4–8.2)

## 2014-02-13 LAB — CBC WITH DIFFERENTIAL/PLATELET
Basophil #: 0.1 10*3/uL (ref 0.0–0.1)
Basophil %: 0.8 %
EOS ABS: 0.1 10*3/uL (ref 0.0–0.7)
EOS PCT: 1.5 %
HCT: 41.6 % (ref 35.0–47.0)
HGB: 13.8 g/dL (ref 12.0–16.0)
LYMPHS ABS: 2.4 10*3/uL (ref 1.0–3.6)
LYMPHS PCT: 27.8 %
MCH: 28.7 pg (ref 26.0–34.0)
MCHC: 33.2 g/dL (ref 32.0–36.0)
MCV: 86 fL (ref 80–100)
Monocyte #: 1 x10 3/mm — ABNORMAL HIGH (ref 0.2–0.9)
Monocyte %: 11.3 %
NEUTROS ABS: 5.1 10*3/uL (ref 1.4–6.5)
Neutrophil %: 58.6 %
PLATELETS: 193 10*3/uL (ref 150–440)
RBC: 4.82 10*6/uL (ref 3.80–5.20)
RDW: 14.3 % (ref 11.5–14.5)
WBC: 8.7 10*3/uL (ref 3.6–11.0)

## 2014-02-13 LAB — URINALYSIS, COMPLETE
BLOOD: NEGATIVE
Bilirubin,UR: NEGATIVE
GLUCOSE, UR: NEGATIVE mg/dL (ref 0–75)
KETONE: NEGATIVE
Leukocyte Esterase: NEGATIVE
Nitrite: NEGATIVE
PROTEIN: NEGATIVE
Ph: 8 (ref 4.5–8.0)
Specific Gravity: 1.003 (ref 1.003–1.030)
WBC UR: NONE SEEN /HPF (ref 0–5)

## 2014-02-13 LAB — LIPASE, BLOOD: LIPASE: 159 U/L (ref 73–393)

## 2014-02-14 DIAGNOSIS — R141 Gas pain: Secondary | ICD-10-CM | POA: Diagnosis not present

## 2014-02-14 DIAGNOSIS — R109 Unspecified abdominal pain: Secondary | ICD-10-CM | POA: Diagnosis not present

## 2014-02-14 LAB — CLOSTRIDIUM DIFFICILE(ARMC)

## 2014-03-12 DIAGNOSIS — L0201 Cutaneous abscess of face: Secondary | ICD-10-CM | POA: Diagnosis not present

## 2014-03-12 DIAGNOSIS — L03211 Cellulitis of face: Secondary | ICD-10-CM | POA: Diagnosis not present

## 2014-03-25 DIAGNOSIS — N360 Urethral fistula: Secondary | ICD-10-CM | POA: Diagnosis not present

## 2014-03-25 DIAGNOSIS — N23 Unspecified renal colic: Secondary | ICD-10-CM | POA: Diagnosis not present

## 2014-04-02 DIAGNOSIS — R5383 Other fatigue: Secondary | ICD-10-CM | POA: Diagnosis not present

## 2014-04-02 DIAGNOSIS — J069 Acute upper respiratory infection, unspecified: Secondary | ICD-10-CM | POA: Diagnosis not present

## 2014-04-02 DIAGNOSIS — R0789 Other chest pain: Secondary | ICD-10-CM | POA: Diagnosis not present

## 2014-04-02 DIAGNOSIS — R5381 Other malaise: Secondary | ICD-10-CM | POA: Diagnosis not present

## 2014-04-05 DIAGNOSIS — T7840XA Allergy, unspecified, initial encounter: Secondary | ICD-10-CM | POA: Diagnosis not present

## 2014-04-23 DIAGNOSIS — N390 Urinary tract infection, site not specified: Secondary | ICD-10-CM | POA: Diagnosis not present

## 2014-04-23 DIAGNOSIS — J309 Allergic rhinitis, unspecified: Secondary | ICD-10-CM | POA: Diagnosis not present

## 2014-04-23 DIAGNOSIS — R300 Dysuria: Secondary | ICD-10-CM | POA: Diagnosis not present

## 2014-04-23 DIAGNOSIS — J019 Acute sinusitis, unspecified: Secondary | ICD-10-CM | POA: Diagnosis not present

## 2014-05-02 DIAGNOSIS — R42 Dizziness and giddiness: Secondary | ICD-10-CM | POA: Diagnosis not present

## 2014-05-02 DIAGNOSIS — R51 Headache: Secondary | ICD-10-CM | POA: Diagnosis not present

## 2014-05-03 ENCOUNTER — Emergency Department: Payer: Self-pay

## 2014-05-03 DIAGNOSIS — R141 Gas pain: Secondary | ICD-10-CM | POA: Diagnosis not present

## 2014-05-03 DIAGNOSIS — R143 Flatulence: Secondary | ICD-10-CM | POA: Diagnosis not present

## 2014-05-03 DIAGNOSIS — R142 Eructation: Secondary | ICD-10-CM | POA: Diagnosis not present

## 2014-05-03 DIAGNOSIS — R42 Dizziness and giddiness: Secondary | ICD-10-CM | POA: Diagnosis not present

## 2014-05-03 DIAGNOSIS — Z79899 Other long term (current) drug therapy: Secondary | ICD-10-CM | POA: Diagnosis not present

## 2014-05-03 LAB — COMPREHENSIVE METABOLIC PANEL
ALBUMIN: 3.7 g/dL (ref 3.4–5.0)
ANION GAP: 7 (ref 7–16)
AST: 17 U/L (ref 15–37)
Alkaline Phosphatase: 68 U/L
BILIRUBIN TOTAL: 0.6 mg/dL (ref 0.2–1.0)
BUN: 9 mg/dL (ref 7–18)
CALCIUM: 9.5 mg/dL (ref 8.5–10.1)
CHLORIDE: 103 mmol/L (ref 98–107)
CREATININE: 0.81 mg/dL (ref 0.60–1.30)
Co2: 27 mmol/L (ref 21–32)
GLUCOSE: 92 mg/dL (ref 65–99)
OSMOLALITY: 272 (ref 275–301)
Potassium: 4 mmol/L (ref 3.5–5.1)
SGPT (ALT): 21 U/L
Sodium: 137 mmol/L (ref 136–145)
TOTAL PROTEIN: 7.2 g/dL (ref 6.4–8.2)

## 2014-05-03 LAB — URINALYSIS, COMPLETE
BLOOD: NEGATIVE
Bacteria: NONE SEEN
Bilirubin,UR: NEGATIVE
GLUCOSE, UR: NEGATIVE mg/dL (ref 0–75)
Ketone: NEGATIVE
LEUKOCYTE ESTERASE: NEGATIVE
NITRITE: NEGATIVE
PROTEIN: NEGATIVE
Ph: 8 (ref 4.5–8.0)
RBC,UR: <1 /HPF (ref 0–5)
Specific Gravity: 1.003 (ref 1.003–1.030)
Squamous Epithelial: <1

## 2014-05-03 LAB — CBC
HCT: 43.7 % (ref 35.0–47.0)
HGB: 14 g/dL (ref 12.0–16.0)
MCH: 28.2 pg (ref 26.0–34.0)
MCHC: 32 g/dL (ref 32.0–36.0)
MCV: 88 fL (ref 80–100)
Platelet: 192 10*3/uL (ref 150–440)
RBC: 4.96 10*6/uL (ref 3.80–5.20)
RDW: 14.2 % (ref 11.5–14.5)
WBC: 7.2 10*3/uL (ref 3.6–11.0)

## 2014-05-03 LAB — TROPONIN I

## 2014-05-15 DIAGNOSIS — R5383 Other fatigue: Secondary | ICD-10-CM | POA: Diagnosis not present

## 2014-05-15 DIAGNOSIS — R5381 Other malaise: Secondary | ICD-10-CM | POA: Diagnosis not present

## 2014-05-15 DIAGNOSIS — K5909 Other constipation: Secondary | ICD-10-CM | POA: Diagnosis not present

## 2014-05-15 DIAGNOSIS — J309 Allergic rhinitis, unspecified: Secondary | ICD-10-CM | POA: Diagnosis not present

## 2014-05-15 DIAGNOSIS — K649 Unspecified hemorrhoids: Secondary | ICD-10-CM | POA: Diagnosis not present

## 2014-05-15 DIAGNOSIS — R42 Dizziness and giddiness: Secondary | ICD-10-CM | POA: Diagnosis not present

## 2014-05-15 DIAGNOSIS — H9319 Tinnitus, unspecified ear: Secondary | ICD-10-CM | POA: Diagnosis not present

## 2014-05-15 DIAGNOSIS — M159 Polyosteoarthritis, unspecified: Secondary | ICD-10-CM | POA: Diagnosis not present

## 2014-05-15 DIAGNOSIS — I10 Essential (primary) hypertension: Secondary | ICD-10-CM | POA: Diagnosis not present

## 2014-06-11 DIAGNOSIS — R1031 Right lower quadrant pain: Secondary | ICD-10-CM | POA: Diagnosis not present

## 2014-06-11 DIAGNOSIS — K5900 Constipation, unspecified: Secondary | ICD-10-CM | POA: Diagnosis not present

## 2014-06-25 DIAGNOSIS — N390 Urinary tract infection, site not specified: Secondary | ICD-10-CM | POA: Diagnosis not present

## 2014-06-30 DIAGNOSIS — L259 Unspecified contact dermatitis, unspecified cause: Secondary | ICD-10-CM | POA: Diagnosis not present

## 2014-07-04 DIAGNOSIS — M1710 Unilateral primary osteoarthritis, unspecified knee: Secondary | ICD-10-CM | POA: Diagnosis not present

## 2014-07-04 DIAGNOSIS — M7120 Synovial cyst of popliteal space [Baker], unspecified knee: Secondary | ICD-10-CM | POA: Diagnosis not present

## 2014-08-01 DIAGNOSIS — I10 Essential (primary) hypertension: Secondary | ICD-10-CM | POA: Diagnosis not present

## 2014-08-01 DIAGNOSIS — I83813 Varicose veins of bilateral lower extremities with pain: Secondary | ICD-10-CM | POA: Diagnosis not present

## 2014-08-01 DIAGNOSIS — K921 Melena: Secondary | ICD-10-CM | POA: Diagnosis not present

## 2014-08-01 DIAGNOSIS — K219 Gastro-esophageal reflux disease without esophagitis: Secondary | ICD-10-CM | POA: Diagnosis not present

## 2014-08-01 DIAGNOSIS — M5441 Lumbago with sciatica, right side: Secondary | ICD-10-CM | POA: Diagnosis not present

## 2014-08-01 DIAGNOSIS — K5900 Constipation, unspecified: Secondary | ICD-10-CM | POA: Diagnosis not present

## 2014-08-01 DIAGNOSIS — M25561 Pain in right knee: Secondary | ICD-10-CM | POA: Diagnosis not present

## 2014-08-01 DIAGNOSIS — K648 Other hemorrhoids: Secondary | ICD-10-CM | POA: Diagnosis not present

## 2014-08-04 ENCOUNTER — Emergency Department: Payer: Self-pay | Admitting: Emergency Medicine

## 2014-08-04 DIAGNOSIS — G8929 Other chronic pain: Secondary | ICD-10-CM | POA: Diagnosis not present

## 2014-08-04 DIAGNOSIS — W19XXXA Unspecified fall, initial encounter: Secondary | ICD-10-CM | POA: Diagnosis not present

## 2014-08-04 DIAGNOSIS — R55 Syncope and collapse: Secondary | ICD-10-CM | POA: Diagnosis not present

## 2014-08-04 DIAGNOSIS — K219 Gastro-esophageal reflux disease without esophagitis: Secondary | ICD-10-CM | POA: Diagnosis not present

## 2014-08-04 DIAGNOSIS — M79604 Pain in right leg: Secondary | ICD-10-CM | POA: Diagnosis not present

## 2014-08-04 DIAGNOSIS — Z23 Encounter for immunization: Secondary | ICD-10-CM | POA: Diagnosis not present

## 2014-08-04 DIAGNOSIS — S31809A Unspecified open wound of unspecified buttock, initial encounter: Secondary | ICD-10-CM | POA: Diagnosis not present

## 2014-08-04 DIAGNOSIS — S3993XA Unspecified injury of pelvis, initial encounter: Secondary | ICD-10-CM | POA: Diagnosis not present

## 2014-08-04 DIAGNOSIS — F411 Generalized anxiety disorder: Secondary | ICD-10-CM | POA: Diagnosis not present

## 2014-08-04 DIAGNOSIS — Z90710 Acquired absence of both cervix and uterus: Secondary | ICD-10-CM | POA: Diagnosis not present

## 2014-08-04 DIAGNOSIS — R262 Difficulty in walking, not elsewhere classified: Secondary | ICD-10-CM | POA: Diagnosis not present

## 2014-08-04 DIAGNOSIS — I10 Essential (primary) hypertension: Secondary | ICD-10-CM | POA: Diagnosis not present

## 2014-08-04 DIAGNOSIS — S92111A Displaced fracture of neck of right talus, initial encounter for closed fracture: Secondary | ICD-10-CM | POA: Diagnosis not present

## 2014-08-04 DIAGNOSIS — Z9049 Acquired absence of other specified parts of digestive tract: Secondary | ICD-10-CM | POA: Diagnosis not present

## 2014-08-04 DIAGNOSIS — M810 Age-related osteoporosis without current pathological fracture: Secondary | ICD-10-CM | POA: Diagnosis not present

## 2014-08-04 DIAGNOSIS — M25571 Pain in right ankle and joints of right foot: Secondary | ICD-10-CM | POA: Diagnosis not present

## 2014-08-04 DIAGNOSIS — S93601A Unspecified sprain of right foot, initial encounter: Secondary | ICD-10-CM | POA: Diagnosis not present

## 2014-08-04 DIAGNOSIS — M7989 Other specified soft tissue disorders: Secondary | ICD-10-CM | POA: Diagnosis not present

## 2014-08-04 DIAGNOSIS — R61 Generalized hyperhidrosis: Secondary | ICD-10-CM | POA: Diagnosis not present

## 2014-08-04 DIAGNOSIS — R4182 Altered mental status, unspecified: Secondary | ICD-10-CM | POA: Diagnosis not present

## 2014-08-04 DIAGNOSIS — S92151D Displaced avulsion fracture (chip fracture) of right talus, subsequent encounter for fracture with routine healing: Secondary | ICD-10-CM | POA: Diagnosis not present

## 2014-08-04 DIAGNOSIS — S99921A Unspecified injury of right foot, initial encounter: Secondary | ICD-10-CM | POA: Diagnosis not present

## 2014-08-04 DIAGNOSIS — M4800 Spinal stenosis, site unspecified: Secondary | ICD-10-CM | POA: Diagnosis not present

## 2014-08-04 DIAGNOSIS — S99911A Unspecified injury of right ankle, initial encounter: Secondary | ICD-10-CM | POA: Diagnosis not present

## 2014-08-04 DIAGNOSIS — I351 Nonrheumatic aortic (valve) insufficiency: Secondary | ICD-10-CM | POA: Diagnosis not present

## 2014-08-04 DIAGNOSIS — H409 Unspecified glaucoma: Secondary | ICD-10-CM | POA: Diagnosis not present

## 2014-08-04 DIAGNOSIS — I440 Atrioventricular block, first degree: Secondary | ICD-10-CM | POA: Diagnosis not present

## 2014-08-04 DIAGNOSIS — R102 Pelvic and perineal pain: Secondary | ICD-10-CM | POA: Diagnosis not present

## 2014-08-04 DIAGNOSIS — I959 Hypotension, unspecified: Secondary | ICD-10-CM | POA: Diagnosis not present

## 2014-08-04 LAB — BASIC METABOLIC PANEL
Anion Gap: 8 (ref 7–16)
BUN: 18 mg/dL (ref 7–18)
CREATININE: 0.98 mg/dL (ref 0.60–1.30)
Calcium, Total: 9.2 mg/dL (ref 8.5–10.1)
Chloride: 104 mmol/L (ref 98–107)
Co2: 28 mmol/L (ref 21–32)
EGFR (African American): >60
EGFR (Non-African Amer.): 57 — ABNORMAL LOW
GLUCOSE: 120 mg/dL — AB (ref 65–99)
Osmolality: 282 (ref 275–301)
Potassium: 3.6 mmol/L (ref 3.5–5.1)
SODIUM: 140 mmol/L (ref 136–145)

## 2014-08-04 LAB — CBC WITH DIFFERENTIAL/PLATELET
BASOS PCT: 0.6 %
Basophil #: 0.1 10*3/uL (ref 0.0–0.1)
EOS ABS: 0.3 10*3/uL (ref 0.0–0.7)
EOS PCT: 2.6 %
HCT: 41 % (ref 35.0–47.0)
HGB: 13.4 g/dL (ref 12.0–16.0)
Lymphocyte #: 2.4 10*3/uL (ref 1.0–3.6)
Lymphocyte %: 18.2 %
MCH: 28.8 pg (ref 26.0–34.0)
MCHC: 32.8 g/dL (ref 32.0–36.0)
MCV: 88 fL (ref 80–100)
Monocyte #: 1.4 x10 3/mm — ABNORMAL HIGH (ref 0.2–0.9)
Monocyte %: 10.6 %
NEUTROS PCT: 68 %
Neutrophil #: 8.9 10*3/uL — ABNORMAL HIGH (ref 1.4–6.5)
Platelet: 197 10*3/uL (ref 150–440)
RBC: 4.66 10*6/uL (ref 3.80–5.20)
RDW: 14.1 % (ref 11.5–14.5)
WBC: 13.1 10*3/uL — AB (ref 3.6–11.0)

## 2014-08-04 LAB — TROPONIN I: Troponin-I: <0.02 ng/mL

## 2014-08-05 ENCOUNTER — Observation Stay: Payer: Self-pay | Admitting: Specialist

## 2014-08-05 DIAGNOSIS — M4800 Spinal stenosis, site unspecified: Secondary | ICD-10-CM | POA: Diagnosis not present

## 2014-08-05 DIAGNOSIS — R55 Syncope and collapse: Secondary | ICD-10-CM | POA: Diagnosis not present

## 2014-08-05 DIAGNOSIS — I10 Essential (primary) hypertension: Secondary | ICD-10-CM | POA: Diagnosis not present

## 2014-08-05 DIAGNOSIS — I6523 Occlusion and stenosis of bilateral carotid arteries: Secondary | ICD-10-CM | POA: Diagnosis not present

## 2014-08-05 DIAGNOSIS — I351 Nonrheumatic aortic (valve) insufficiency: Secondary | ICD-10-CM | POA: Diagnosis not present

## 2014-08-05 DIAGNOSIS — S92901A Unspecified fracture of right foot, initial encounter for closed fracture: Secondary | ICD-10-CM | POA: Diagnosis not present

## 2014-08-05 DIAGNOSIS — S92201A Fracture of unspecified tarsal bone(s) of right foot, initial encounter for closed fracture: Secondary | ICD-10-CM | POA: Diagnosis not present

## 2014-08-05 LAB — URINALYSIS, COMPLETE
BACTERIA: NONE SEEN
BILIRUBIN, UR: NEGATIVE
BLOOD: NEGATIVE
GLUCOSE, UR: NEGATIVE mg/dL (ref 0–75)
Ketone: NEGATIVE
Leukocyte Esterase: NEGATIVE
Nitrite: NEGATIVE
PH: 7 (ref 4.5–8.0)
Protein: NEGATIVE
RBC, UR: NONE SEEN /HPF (ref 0–5)
SPECIFIC GRAVITY: 1.004 (ref 1.003–1.030)
SQUAMOUS EPITHELIAL: NONE SEEN

## 2014-08-05 LAB — CK TOTAL AND CKMB (NOT AT ARMC)
CK, TOTAL: 138 U/L
CK, TOTAL: 126 U/L
CK, TOTAL: 112 U/L
CK-MB: 2.5 ng/mL (ref 0.5–3.6)
CK-MB: 2.2 ng/mL (ref 0.5–3.6)
CK-MB: 1.9 ng/mL (ref 0.5–3.6)

## 2014-08-05 LAB — CBC WITH DIFFERENTIAL/PLATELET
BASOS ABS: 0 10*3/uL (ref 0.0–0.1)
BASOS PCT: 0.4 %
EOS PCT: 1 %
Eosinophil #: 0.1 10*3/uL (ref 0.0–0.7)
HCT: 40 % (ref 35.0–47.0)
HGB: 12.6 g/dL (ref 12.0–16.0)
Lymphocyte #: 1.8 10*3/uL (ref 1.0–3.6)
Lymphocyte %: 15.9 %
MCH: 27.7 pg (ref 26.0–34.0)
MCHC: 31.4 g/dL — ABNORMAL LOW (ref 32.0–36.0)
MCV: 88 fL (ref 80–100)
MONO ABS: 0.9 x10 3/mm (ref 0.2–0.9)
Monocyte %: 7.8 %
NEUTROS PCT: 74.9 %
Neutrophil #: 8.6 10*3/uL — ABNORMAL HIGH (ref 1.4–6.5)
PLATELETS: 174 10*3/uL (ref 150–440)
RBC: 4.54 10*6/uL (ref 3.80–5.20)
RDW: 14 % (ref 11.5–14.5)
WBC: 11.5 10*3/uL — AB (ref 3.6–11.0)

## 2014-08-05 LAB — TROPONIN I: Troponin-I: <0.02 ng/mL

## 2014-08-06 DIAGNOSIS — I10 Essential (primary) hypertension: Secondary | ICD-10-CM | POA: Diagnosis not present

## 2014-08-06 DIAGNOSIS — R55 Syncope and collapse: Secondary | ICD-10-CM | POA: Diagnosis not present

## 2014-08-06 DIAGNOSIS — M4800 Spinal stenosis, site unspecified: Secondary | ICD-10-CM | POA: Diagnosis not present

## 2014-08-06 DIAGNOSIS — S92901A Unspecified fracture of right foot, initial encounter for closed fracture: Secondary | ICD-10-CM | POA: Diagnosis not present

## 2014-08-08 DIAGNOSIS — F419 Anxiety disorder, unspecified: Secondary | ICD-10-CM | POA: Diagnosis not present

## 2014-08-08 DIAGNOSIS — R262 Difficulty in walking, not elsewhere classified: Secondary | ICD-10-CM | POA: Diagnosis not present

## 2014-08-08 DIAGNOSIS — I10 Essential (primary) hypertension: Secondary | ICD-10-CM | POA: Diagnosis not present

## 2014-08-08 DIAGNOSIS — S92151D Displaced avulsion fracture (chip fracture) of right talus, subsequent encounter for fracture with routine healing: Secondary | ICD-10-CM | POA: Diagnosis not present

## 2014-08-08 DIAGNOSIS — M6281 Muscle weakness (generalized): Secondary | ICD-10-CM | POA: Diagnosis not present

## 2014-09-01 ENCOUNTER — Emergency Department: Payer: Self-pay | Admitting: Emergency Medicine

## 2014-09-01 DIAGNOSIS — T7840XA Allergy, unspecified, initial encounter: Secondary | ICD-10-CM | POA: Diagnosis not present

## 2014-09-01 DIAGNOSIS — I10 Essential (primary) hypertension: Secondary | ICD-10-CM | POA: Diagnosis not present

## 2014-09-01 DIAGNOSIS — Z880 Allergy status to penicillin: Secondary | ICD-10-CM | POA: Diagnosis not present

## 2014-09-01 DIAGNOSIS — L500 Allergic urticaria: Secondary | ICD-10-CM | POA: Diagnosis not present

## 2014-09-03 DIAGNOSIS — T7840XA Allergy, unspecified, initial encounter: Secondary | ICD-10-CM | POA: Diagnosis not present

## 2014-09-12 DIAGNOSIS — M79604 Pain in right leg: Secondary | ICD-10-CM | POA: Diagnosis not present

## 2014-09-12 DIAGNOSIS — T7840XA Allergy, unspecified, initial encounter: Secondary | ICD-10-CM | POA: Diagnosis not present

## 2014-09-12 DIAGNOSIS — M25531 Pain in right wrist: Secondary | ICD-10-CM | POA: Diagnosis not present

## 2014-09-12 DIAGNOSIS — M5441 Lumbago with sciatica, right side: Secondary | ICD-10-CM | POA: Diagnosis not present

## 2014-09-12 DIAGNOSIS — E039 Hypothyroidism, unspecified: Secondary | ICD-10-CM | POA: Diagnosis not present

## 2014-09-17 DIAGNOSIS — S93621A Sprain of tarsometatarsal ligament of right foot, initial encounter: Secondary | ICD-10-CM | POA: Diagnosis not present

## 2014-09-19 ENCOUNTER — Ambulatory Visit: Payer: Self-pay

## 2014-09-19 DIAGNOSIS — Z1231 Encounter for screening mammogram for malignant neoplasm of breast: Secondary | ICD-10-CM | POA: Diagnosis not present

## 2014-09-25 DIAGNOSIS — T7840XA Allergy, unspecified, initial encounter: Secondary | ICD-10-CM | POA: Diagnosis not present

## 2014-09-25 DIAGNOSIS — M364 Arthropathy in hypersensitivity reactions classified elsewhere: Secondary | ICD-10-CM | POA: Diagnosis not present

## 2014-10-07 ENCOUNTER — Ambulatory Visit: Payer: Self-pay

## 2014-10-07 DIAGNOSIS — M5441 Lumbago with sciatica, right side: Secondary | ICD-10-CM | POA: Diagnosis not present

## 2014-10-07 DIAGNOSIS — I10 Essential (primary) hypertension: Secondary | ICD-10-CM | POA: Diagnosis not present

## 2014-10-07 DIAGNOSIS — S8991XA Unspecified injury of right lower leg, initial encounter: Secondary | ICD-10-CM | POA: Diagnosis not present

## 2014-10-07 DIAGNOSIS — M25561 Pain in right knee: Secondary | ICD-10-CM | POA: Diagnosis not present

## 2014-10-07 DIAGNOSIS — M1711 Unilateral primary osteoarthritis, right knee: Secondary | ICD-10-CM | POA: Diagnosis not present

## 2014-11-08 DIAGNOSIS — H532 Diplopia: Secondary | ICD-10-CM | POA: Diagnosis not present

## 2014-11-19 DIAGNOSIS — I10 Essential (primary) hypertension: Secondary | ICD-10-CM | POA: Diagnosis not present

## 2014-11-19 DIAGNOSIS — K219 Gastro-esophageal reflux disease without esophagitis: Secondary | ICD-10-CM | POA: Diagnosis not present

## 2014-11-19 DIAGNOSIS — J0111 Acute recurrent frontal sinusitis: Secondary | ICD-10-CM | POA: Diagnosis not present

## 2014-11-19 DIAGNOSIS — M5441 Lumbago with sciatica, right side: Secondary | ICD-10-CM | POA: Diagnosis not present

## 2014-11-23 DIAGNOSIS — J0100 Acute maxillary sinusitis, unspecified: Secondary | ICD-10-CM | POA: Diagnosis not present

## 2014-11-23 DIAGNOSIS — H6502 Acute serous otitis media, left ear: Secondary | ICD-10-CM | POA: Diagnosis not present

## 2014-11-28 DIAGNOSIS — R51 Headache: Secondary | ICD-10-CM | POA: Diagnosis not present

## 2014-11-28 DIAGNOSIS — H903 Sensorineural hearing loss, bilateral: Secondary | ICD-10-CM | POA: Diagnosis not present

## 2014-11-28 DIAGNOSIS — R42 Dizziness and giddiness: Secondary | ICD-10-CM | POA: Diagnosis not present

## 2014-12-02 DIAGNOSIS — K219 Gastro-esophageal reflux disease without esophagitis: Secondary | ICD-10-CM | POA: Diagnosis not present

## 2014-12-02 DIAGNOSIS — F411 Generalized anxiety disorder: Secondary | ICD-10-CM | POA: Diagnosis not present

## 2014-12-02 DIAGNOSIS — I10 Essential (primary) hypertension: Secondary | ICD-10-CM | POA: Diagnosis not present

## 2014-12-02 DIAGNOSIS — M5441 Lumbago with sciatica, right side: Secondary | ICD-10-CM | POA: Diagnosis not present

## 2014-12-02 DIAGNOSIS — N3091 Cystitis, unspecified with hematuria: Secondary | ICD-10-CM | POA: Diagnosis not present

## 2014-12-02 DIAGNOSIS — J0111 Acute recurrent frontal sinusitis: Secondary | ICD-10-CM | POA: Diagnosis not present

## 2014-12-10 ENCOUNTER — Ambulatory Visit: Payer: Self-pay | Admitting: Nurse Practitioner

## 2014-12-10 DIAGNOSIS — J329 Chronic sinusitis, unspecified: Secondary | ICD-10-CM | POA: Diagnosis not present

## 2014-12-10 DIAGNOSIS — J3489 Other specified disorders of nose and nasal sinuses: Secondary | ICD-10-CM | POA: Diagnosis not present

## 2014-12-10 DIAGNOSIS — R51 Headache: Secondary | ICD-10-CM | POA: Diagnosis not present

## 2014-12-10 DIAGNOSIS — R42 Dizziness and giddiness: Secondary | ICD-10-CM | POA: Diagnosis not present

## 2014-12-17 DIAGNOSIS — J329 Chronic sinusitis, unspecified: Secondary | ICD-10-CM | POA: Diagnosis not present

## 2014-12-17 DIAGNOSIS — J340 Abscess, furuncle and carbuncle of nose: Secondary | ICD-10-CM | POA: Diagnosis not present

## 2015-02-01 NOTE — H&P (Signed)
PATIENT NAME:  [name], [name] MR#:  [id number] DATE OF BIRTH:  08/07/1925  DATE OF ADMISSION:  08/05/2014  EMERGENCY ROOM DOCTOR: Elta Guadeloupe R. Jacqualine Code, MD  PRIMARY CARE DOCTOR: Lavera Guise, MD  ADMITTING DOCTOR: Juluis Mire, MD   CHIEF COMPLAINT: Syncopal episode.   HISTORY OF PRESENT ILLNESS: An 81 year old Caucasian female, a pleasant lady, with a past medical history significant for hypertension, spinal stenosis, generalized anxiety disorder, history of leaky valve, presents to the Emergency Room via EMS with an episode of syncopal episode while she was using bathroom. According to the patient and the patient's daughter, who is with her at this time, states that she had a fall earlier today around 4:30 p.m. following going down the stairs. She tripped and fell down and injured her right foot, for which she came to the Emergency Room and was evaluated by the ER physician and diagnosed to have probable small avulsion fracture of the right foot neck of the distal talus. The patient was given supportive footwear and pain medications and was sent home. She did not have any chest pain, shortness of breath, diaphoresis, loss of consciousness, dizziness at that time. The patient went home on pain medications and was having significant pain, and around 7:30 p.m. she was trying to use the bathroom, so while doing that she felt increased right foot pain, following which she passed out. She passed out for a few minutes. EMS was called by patient's daughter, and the patient was found to have low blood pressure, was given IV fluids by EMS, and patient was having some altered sensorium for a few minutes following the episode, but after the IV fluids the patient regained her consciousness full. The patient denies having any chest pain, shortness of breath, dizziness prior to the episode of the syncope. She did have some diaphoresis. No nausea. No vomiting. No diarrhea. No urinary symptoms. The patient states she  was doing absolutely fine before the fall she had earlier today. So, patient was evaluated by the ED physician again because of the syncopal episode, and found to be with stable vitals, and currently she is alert, awake, and oriented x 3. Complains of right foot pain; otherwise denies any other complaints such as chest pain, shortness of breath, dizziness, nausea, vomiting, diaphoresis. In view of her syncopal episode and advanced age, hospitalist service was consulted by the ED physician for further management. The patient is admitted for observation because of the syncopal episode.   PAST MEDICAL HISTORY: 1.  Hypertension.  2.  A leaky valve per patient, but patient not able to elaborate anything on that one.  3.  Spinal stenosis status post back surgery x 2.  4.  History of glaucoma.  5.  Osteoporosis.  6.  Generalized anxiety disorder.  PAST SURGICAL HISTORY: 1.  Status post  appendectomy.  2.  Status post tonsillectomy and adenoidectomy.  3.  Status post hysterectomy.  4.  Status post back surgery for spinal stenosis x 2.  5.  Status post hemorrhoidectomy.  HOME MEDICATIONS: 1.  Amlodipine 5 mg 1 tablet orally daily.  2.  Carvedilol 6.25 mg 1 tablet twice daily.  3.  Lorazepam 0.5 mg 1 tablet 3 times a day as needed for anxiety.  4.  Mirtazapine 30 mg tablet 1 tablet orally at bedtime.  5.  Norco 325/10 mg tablet 1 tablet 3 times a day.  6.  Timolol ophthalmic 0.5% ophthalmic solution, 1 drop to each affected eye 2 times a day.  ALLERGIES: 1.  VALIUM.  2.  CODEINE.  3.  SULFA.  4.  MORPHINE.  5.  PENICILLIN.  6.  LEVAQUIN.  SOCIAL HISTORY: She lives with her daughter. No history of smoking, alcohol, or substance abuse.  FAMILY HISTORY: Nil significant.  REVIEW OF SYSTEMS:  CONSTITUTIONAL: Negative for fever, fatigue, weakness, excessive weight gain or weight loss. EYES: Negative for blurred vision or double vision. No pain. No redness. No inflammation. History of  glaucoma. Uses timolol eye drops.  EARS, NOSE, THROAT: Negative for tinnitus, hearing pain, ear pain. No epistaxis. No nasal discharge. No difficulty swallowing.  RESPIRATORY: Negative for cough, wheezing, dyspnea, shortness of breath, hemoptysis, painful respirations.  CARDIOVASCULAR: Negative for chest pain, orthopnea, paroxysmal nocturnal dyspnea, dyspnea on exertion. No edema, no palpitations. She did pass out secondary due to right foot pain while she going to the restroom. GASTROINTESTINAL: Negative for nausea, vomiting, diarrhea, abdominal pain, hematemesis, melena, GERD symptoms, or rectal bleeding. GENITOURINARY: Negative for dysuria, hematuria, frequency, or urgency.  ENDOCRINE: Negative for polyuria, polydipsia. No heat or cold intolerance. HEMATOLOGIC: Negative for anemia, easy bruising or bleeding.  INTEGUMENTARY: Negative for acne, skin rash, or lesions.  MUSCULOSKELETAL: She does have chronic back pain secondary due to her spinal stenosis, and also she has chronic right leg pain for which she sees orthopedician, Dr. Tamala Julian of [facility], and she uses chronic pain medications.  NEUROLOGICAL: Negative for focal weakness, numbness. No history of CVA, TIA, seizure disorder. PSYCHIATRIC: History of anxiety disorder for which she takes medications and is under control. Denies any depression or insomnia.   PHYSICAL EXAMINATION:  VITAL SIGNS: Temperature 97.5 degrees Fahrenheit; pulse rate 86 per minute; respirations 18; blood pressure on presentation in the Emergency Room 96/70, right now is 110/78; oxygen saturations are 99% to 100% on room air. GENERAL: Elderly female; alert, awake, and oriented x 3; pleasant and cooperative; comfortably resting in the bed in no acute distress.  HEAD: Atraumatic, normocephalic.  EYES: Pupils equal, reactive to light and accommodation. No conjunctival pallor. No scleral icterus. Extraocular movements intact.  NOSE: No nasal lesions. No drainage.   EARS: No drainage. No external lesions.  ORAL CAVITY: No mucosal lesions. No exudates. No masses.  NECK: Supple. No JVD. No thyromegaly. No carotid bruit. Range of motion normal.  RESPIRATORY: Good respiratory effort. Not using accessory muscles of respiration. Bilateral vesicular breath sounds present. No rales or rhonchi bilaterally.  CARDIOVASCULAR: S1, S2 regular. No murmurs or gallops or clicks appreciated. No pedal edema. Pulses equal at carotid, femoral, and pedal pulses.  GASTROINTESTINAL: Abdomen soft, nontender. No hepatosplenomegaly. Bowel sounds present and equal in all 4 quadrants. No tenderness. No guarding. No rigidity.  GENITOURINARY: Deferred.  MUSCULOSKELETAL: Decreased range of motion of right foot and right foot is in a supportive shoe secondary due to her recent injury secondary due to fall. She does have chronic right foot pain. Range of motion of left lower extremity normal. SKIN: Inspection within normal limits.  LYMPHATIC: No cervical lymphadenopathy.  VASCULAR: Good dorsalis pedis and posterior tibial pulses.  NEUROLOGICAL: Alert, awake, and oriented x 3. Cranial nerves II through XII grossly intact. DTRs symmetrical, 2+ bilateral upper and lower extremities. Motor strength is 5/5 bilateral upper and lower extremities.  PSYCHIATRIC: Judgment and insight are adequate. Alert and oriented x 3. Memory and mood within normal limits.   LABORATORY DATA: Serum glucose 120, BUN 18, creatinine 0.98. serum sodium 140, potassium 3.6, chloride 104, bicarbonate 28. Troponin less than 0.02. WBC 13.1, hemoglobin  13.4, hematocrit 41.0, platelet count 197,000.   IMAGING STUDIES: 1.  Right foot x-ray: Possible small avulsion fracture, dorsal neck of the distal talus.  2.  Right ankle x-ray: No acute osseous abnormalities. No fracture or dislocation or bony destruction.  3.  X-ray, pelvis: No evidence of acute pelvic fracture or diastasis. 4.  EKG: Normal sinus rhythm with ventricular  rate of 86 beats per minute. First-degree AV block. Poor R-wave progression. No acute ST-T changes.  ASSESSMENT AND PLAN: An 81 year old Caucasian female with a past medical history significant for hypertension, spinal stenosis, history of glaucoma and osteoporosis, who presents with a syncopal episode secondary due to right foot pain while using bathroom.  1.  Syncopal episode secondary due to right foot pain while using bathroom, likely vasovagal. Rule out arrhythmias. Rule out acute coronary event. Rule out neurological causes. Plan: Admit to telemetry. Cycle cardiac enzymes. Monitor blood pressures, orthostatic changes. Obtain carotid Doppler, echo. Pain control measures.  2.  Fracture, right foot distal talus, secondary due to fall following tripping, which is mechanical. Plan: Continue pain control measures. Foot support. Orthopedic consult.  3.  History of hypertension, well controlled on home medications. Continue home medications. 4.  History of spinal stenosis status post back surgery x 2 with chronic right lower extremity pain on chronic pain medications. Continue pain medications as needed.  5.  History of anxiety disorder, stable on home medications. Continue same.  6.  Deep vein thrombosis prophylaxis, subcutaneous Lovenox.  7.  Gastrointestinal prophylaxis, pantoprazole.  CODE STATUS: Full code.   TIME SPENT: 55 minutes.    ____________________________ Juluis Mire, MD enr:ST D: 08/05/2014 00:16:01 ET T: 08/05/2014 01:07:03 ET JOB#: [id number]  cc: Juluis Mire, MD, <Dictator> Lavera Guise, MD Juluis Mire MD ELECTRONICALLY SIGNED 08/08/2014 19:47

## 2015-02-01 NOTE — Discharge Summary (Signed)
PATIENT NAME:  [name], [name] MR#:  [id number] DATE OF BIRTH:  [birth year]  DATE OF ADMISSION:  08/05/2014 DATE OF DISCHARGE:  08/06/2014  For a detailed note, please look at the history and physical done on admission by Dr. Reece Levy.   DIAGNOSES AT DISCHARGE AS FOLLOWS:  Syncope likely vasovagal in nature, status post recent fall with right foot avulsion talus fracture, chronic pain, hypertension, gastroesophageal reflux disease.   DIET:  The patient is being discharged on a low-sodium diet.   ACTIVITY:  As tolerated. The patient is being discharged with home health physical therapy and nursing services.   FOLLOWUP:  Follow up with Dr. Clayborn Bigness in the next 1 to 2 weeks. Also follow up with Dr. Mack Guise from orthopedics in the next 4 to 6 weeks.   DISCHARGE MEDICATIONS:  Coreg 6.25 mg b.i.d., amlodipine 5 mg daily, Remeron 30 mg at bedtime, Norco 10/325 one tab t.i.d., nitrofurantoin 50 mg daily, lorazepam 0.5 mg t.i.d. as needed, and timolol one drop to affected eye b.i.d.   [place] COURSE:  Dr. Thornton Park from orthopedics.   PERTINENT STUDIES DONE DURING HOSPITAL COURSE ARE AS FOLLOWS:  A 2-dimensional echocardiogram done showing ejection fraction of 55 to 60%, normal global left ventricular systolic function, mildly dilated left atrium, mild aortic regurgitation, mild aortic valve sclerosis without stenosis. An ultrasound of the carotids showing no evidence of any hemodynamically significant carotid artery stenosis.   HOSPITAL COURSE:  This is an 81 year old female who presented to the hospital with a syncopal episode.   1.  Syncope. The most likely cause of the patient's syncope was vasovagal in nature. The patient was observed on telemetry and had no evidence of acute arrhythmias. Her echocardiogram showed normal ejection fraction with no wall motion abnormalities, and her ultrasound of the carotids was also essentially normal. Since the patient has had no  further syncopal episode, she is being discharged home.  2.  Right distal talus avulsion fracture secondary to a recent fall. The patient was placed in supportive boot. She was seen by orthopedics; they think that the patient's fracture will take 4 to 6 weeks to heal. She is being discharged on a supportive boot with activities as tolerated and home health physical therapy and will have followup with orthopedics as an outpatient. She will continue Norco for pain, which seemed to have helped her with her symptoms.   3.  Hypertension. The patient remained hemodynamically stable. She will continue her Coreg and Norvasc.  4.  History of spinal stenosis status post back surgery on chronic pain medications. She will continue on Norco as stated.   CODE STATUS:  The patient is a full code.   DISPOSITION:  She is being discharged with home health nursing and physical therapy services.   TIME SPENT:  40 minutes.   ____________________________ Belia Heman. Verdell Carmine, MD vjs:nb D: 08/06/2014 15:46:46 ET T: 08/07/2014 05:11:45 ET JOB#: [id number]  cc: Belia Heman. Verdell Carmine, MD, <Dictator> Lavera Guise, MD Timoteo Gaul, MD  Henreitta Leber MD ELECTRONICALLY SIGNED 08/19/2014 11:06

## 2015-02-01 NOTE — Consult Note (Signed)
Brief Consult Note: Diagnosis: Right foot minimmaly displaced talar avulsion fracture.   Patient was seen by consultant.   Consult note dictated.   Comments: Patient has a tiny talar avulsion fracture that will not need surgery.  She can be treated in hard soled shoe and weightbear as tolerated.  Fracture may take 4-6 weeks to heal.  She may also have a concomitant ankle sprain.  She has chronic right lower extremity pain after spine surgery for which she is treated in a pain center.  She may follow up in my clinic in 4-6 weeks.  Needs a smaller sized hard soled shoe.  Electronic Signatures: Thornton Park (MD)  (Signed 26-Oct-15 15:33)  Authored: Brief Consult Note   Last Updated: 26-Oct-15 15:33 by Thornton Park (MD)

## 2015-02-03 DIAGNOSIS — R6889 Other general symptoms and signs: Secondary | ICD-10-CM | POA: Diagnosis not present

## 2015-02-04 DIAGNOSIS — L821 Other seborrheic keratosis: Secondary | ICD-10-CM | POA: Diagnosis not present

## 2015-02-04 DIAGNOSIS — L820 Inflamed seborrheic keratosis: Secondary | ICD-10-CM | POA: Diagnosis not present

## 2015-02-04 DIAGNOSIS — Z85828 Personal history of other malignant neoplasm of skin: Secondary | ICD-10-CM | POA: Diagnosis not present

## 2015-02-17 DIAGNOSIS — I10 Essential (primary) hypertension: Secondary | ICD-10-CM | POA: Diagnosis not present

## 2015-02-17 DIAGNOSIS — J323 Chronic sphenoidal sinusitis: Secondary | ICD-10-CM | POA: Diagnosis not present

## 2015-02-17 DIAGNOSIS — N390 Urinary tract infection, site not specified: Secondary | ICD-10-CM | POA: Diagnosis not present

## 2015-02-17 DIAGNOSIS — K219 Gastro-esophageal reflux disease without esophagitis: Secondary | ICD-10-CM | POA: Diagnosis not present

## 2015-02-17 DIAGNOSIS — J309 Allergic rhinitis, unspecified: Secondary | ICD-10-CM | POA: Diagnosis not present

## 2015-02-17 DIAGNOSIS — E039 Hypothyroidism, unspecified: Secondary | ICD-10-CM | POA: Diagnosis not present

## 2015-02-26 DIAGNOSIS — M47816 Spondylosis without myelopathy or radiculopathy, lumbar region: Secondary | ICD-10-CM | POA: Diagnosis not present

## 2015-02-26 DIAGNOSIS — M25861 Other specified joint disorders, right knee: Secondary | ICD-10-CM | POA: Diagnosis not present

## 2015-03-14 DIAGNOSIS — N390 Urinary tract infection, site not specified: Secondary | ICD-10-CM | POA: Diagnosis not present

## 2015-03-25 DIAGNOSIS — H3531 Nonexudative age-related macular degeneration: Secondary | ICD-10-CM | POA: Diagnosis not present

## 2015-03-25 DIAGNOSIS — H4922 Sixth [abducent] nerve palsy, left eye: Secondary | ICD-10-CM | POA: Diagnosis not present

## 2015-03-27 DIAGNOSIS — Z0001 Encounter for general adult medical examination with abnormal findings: Secondary | ICD-10-CM | POA: Diagnosis not present

## 2015-03-27 DIAGNOSIS — I10 Essential (primary) hypertension: Secondary | ICD-10-CM | POA: Diagnosis not present

## 2015-03-27 DIAGNOSIS — E039 Hypothyroidism, unspecified: Secondary | ICD-10-CM | POA: Diagnosis not present

## 2015-03-27 DIAGNOSIS — E559 Vitamin D deficiency, unspecified: Secondary | ICD-10-CM | POA: Diagnosis not present

## 2015-06-04 DIAGNOSIS — M25861 Other specified joint disorders, right knee: Secondary | ICD-10-CM | POA: Diagnosis not present

## 2015-06-04 DIAGNOSIS — S93409A Sprain of unspecified ligament of unspecified ankle, initial encounter: Secondary | ICD-10-CM | POA: Diagnosis not present

## 2015-06-04 DIAGNOSIS — M47816 Spondylosis without myelopathy or radiculopathy, lumbar region: Secondary | ICD-10-CM | POA: Diagnosis not present

## 2015-06-04 DIAGNOSIS — S22000A Wedge compression fracture of unspecified thoracic vertebra, initial encounter for closed fracture: Secondary | ICD-10-CM | POA: Diagnosis not present

## 2015-06-20 DIAGNOSIS — M9983 Other biomechanical lesions of lumbar region: Secondary | ICD-10-CM | POA: Diagnosis not present

## 2015-06-20 DIAGNOSIS — M5136 Other intervertebral disc degeneration, lumbar region: Secondary | ICD-10-CM | POA: Diagnosis not present

## 2015-06-20 DIAGNOSIS — M4726 Other spondylosis with radiculopathy, lumbar region: Secondary | ICD-10-CM | POA: Diagnosis not present

## 2015-06-20 DIAGNOSIS — M25561 Pain in right knee: Secondary | ICD-10-CM | POA: Diagnosis not present

## 2015-07-21 ENCOUNTER — Other Ambulatory Visit: Payer: Self-pay | Admitting: Internal Medicine

## 2015-07-21 DIAGNOSIS — Z1231 Encounter for screening mammogram for malignant neoplasm of breast: Secondary | ICD-10-CM

## 2015-07-21 DIAGNOSIS — N390 Urinary tract infection, site not specified: Secondary | ICD-10-CM | POA: Diagnosis not present

## 2015-07-31 ENCOUNTER — Ambulatory Visit: Payer: Medicare Other | Attending: Internal Medicine

## 2015-07-31 ENCOUNTER — Ambulatory Visit: Payer: Self-pay | Admitting: Obstetrics and Gynecology

## 2015-08-12 ENCOUNTER — Emergency Department
Admission: EM | Admit: 2015-08-12 | Discharge: 2015-08-12 | Disposition: A | Discharge status: Home / Self Care | Payer: Medicare Other | Attending: Emergency Medicine | Admitting: Emergency Medicine

## 2015-08-12 ENCOUNTER — Encounter: Payer: Self-pay | Admitting: Emergency Medicine

## 2015-08-12 DIAGNOSIS — I10 Essential (primary) hypertension: Secondary | ICD-10-CM | POA: Diagnosis not present

## 2015-08-12 DIAGNOSIS — R109 Unspecified abdominal pain: Secondary | ICD-10-CM | POA: Diagnosis not present

## 2015-08-12 DIAGNOSIS — R197 Diarrhea, unspecified: Secondary | ICD-10-CM | POA: Insufficient documentation

## 2015-08-12 DIAGNOSIS — M549 Dorsalgia, unspecified: Secondary | ICD-10-CM | POA: Diagnosis not present

## 2015-08-12 DIAGNOSIS — E785 Hyperlipidemia, unspecified: Secondary | ICD-10-CM | POA: Diagnosis not present

## 2015-08-12 DIAGNOSIS — R51 Headache: Secondary | ICD-10-CM | POA: Insufficient documentation

## 2015-08-12 DIAGNOSIS — R519 Headache, unspecified: Secondary | ICD-10-CM

## 2015-08-12 DIAGNOSIS — F419 Anxiety disorder, unspecified: Secondary | ICD-10-CM | POA: Insufficient documentation

## 2015-08-12 HISTORY — DX: Dorsalgia, unspecified: M54.9

## 2015-08-12 HISTORY — DX: Other chronic pain: G89.29

## 2015-08-12 HISTORY — DX: Essential (primary) hypertension: I10

## 2015-08-12 HISTORY — DX: Pure hypercholesterolemia, unspecified: E78.00

## 2015-08-12 HISTORY — DX: Anxiety disorder, unspecified: F41.9

## 2015-08-12 LAB — URINALYSIS COMPLETE WITH MICROSCOPIC (ARMC ONLY)
BILIRUBIN URINE: NEGATIVE
Bacteria, UA: NONE SEEN
Glucose, UA: NEGATIVE mg/dL
HGB URINE DIPSTICK: NEGATIVE
KETONES UR: NEGATIVE mg/dL
Leukocytes, UA: NEGATIVE
NITRITE: NEGATIVE
PROTEIN: NEGATIVE mg/dL
Specific Gravity, Urine: 1.004 — ABNORMAL LOW (ref 1.005–1.030)
pH: 7 (ref 5.0–8.0)

## 2015-08-12 MED ORDER — ONDANSETRON 4 MG PO TBDP
4.0000 mg | ORAL_TABLET | Freq: Once | ORAL | Status: AC
  Administered 2015-08-12: 4 mg via ORAL
  Filled 2015-08-12: qty 1

## 2015-08-12 MED ORDER — OXYCODONE-ACETAMINOPHEN 5-325 MG PO TABS
ORAL_TABLET | ORAL | Status: AC
  Administered 2015-08-12: 1 via ORAL
  Filled 2015-08-12: qty 1

## 2015-08-12 MED ORDER — OXYCODONE-ACETAMINOPHEN 5-325 MG PO TABS
1.0000 | ORAL_TABLET | Freq: Once | ORAL | Status: AC
  Administered 2015-08-12: 1 via ORAL

## 2015-08-12 MED ORDER — TRAMADOL-ACETAMINOPHEN 37.5-325 MG PO TABS: 1.0000 | ORAL_TABLET | Freq: Three times a day (TID) | ORAL | Status: DC | PRN

## 2015-08-12 MED ORDER — TRAMADOL-ACETAMINOPHEN 37.5-325 MG PO TABS: 1.0000 | ORAL_TABLET | Freq: Once | ORAL | Status: DC

## 2015-08-12 NOTE — ED Provider Notes (Signed)
[facility] Emergency Department Provider Note  ____________________________________________  Time seen: Approximately 6:04 PM  I have reviewed the triage vital signs and the nursing notes.   HISTORY  Chief Complaint Back Pain    HPI [name] is a 81 y.o. female patient states right flank pain onset about 2 hours ago. Patient states she's been having intermittent back pain over the weekend but did not resolve. Patient stated this pain is in a different location from her previous back pain over the weekend. Patient also states she's been having diarrhea for the last 3 days. Patient denies any fever or chills. She denies any nausea or vomiting. Patient is rating her pain as a 7/10. Patient described a pain as crampy and intermittently sharp. Patient states the pain radiates to her right lower abdomen.Patient denies any dysuria. Patient also complained of frontal and sinus headache. Patient states she called her family doctor today and he prescribed Z-Pak. He is taking the first doses today and state that she feels nauseated after taking the medication.   Past Medical History  Diagnosis Date  . High cholesterol   . Hypertension   . Anxiety   . Chronic back pain     There are no active problems to display for this patient.   Past Surgical History  Procedure Laterality Date  . Back surgery      Current Outpatient Rx  Name  Route  Sig  Dispense  Refill  . traMADol-acetaminophen (ULTRACET) 37.5-325 MG tablet   Oral   Take 1 tablet by mouth every 8 (eight) hours as needed for moderate pain.   30 tablet   0     Allergies Cortisone; Morphine and related; Prednisone; and Sulfa antibiotics  History reviewed. No pertinent family history.  Social History Social History  Substance Use Topics  . Smoking status: Never Smoker   . Smokeless tobacco: None  . Alcohol Use: No    Review of Systems Constitutional: No fever/chills Eyes: No visual  changes. ENT: No sore throat. Cardiovascular: Denies chest pain. Respiratory: Denies shortness of breath. Gastrointestinal: No abdominal pain.  No nausea, no vomiting.  No diarrhea.  No constipation. Genitourinary: Negative for dysuria. Musculoskeletal: Negative for back pain. Skin: Negative for rash. Neurological: Negative for headaches, focal weakness or numbness. Psychiatric:Anxiety Endocrine:Hypertension and hyperlipidemia Allergic/Immunilogical:see medication list 10-point ROS otherwise negative.  ____________________________________________   PHYSICAL EXAM:  VITAL SIGNS: ED Triage Vitals  Enc Vitals Group     BP 08/12/15 1735 114/93 mmHg     Pulse Rate 08/12/15 1735 93     Resp 08/12/15 1735 20     Temp 08/12/15 1735 98.1 F (36.7 C)     Temp Source 08/12/15 1735 Oral     SpO2 08/12/15 1735 95 %     Weight 08/12/15 1735 130 lb (58.968 kg)     Height 08/12/15 1735 5' (1.524 m)     Head Cir --      Peak Flow --      Pain Score 08/12/15 1735 7     Pain Loc --      Pain Edu? --      Excl. in [place]? --     Constitutional: Alert and oriented. Well appearing and in no acute distress. Eyes: Conjunctivae are normal. PERRL. EOMI. Head: Atraumatic. Nose: No congestion/rhinnorhea. Mouth/Throat: Mucous membranes are moist.  Oropharynx non-erythematous. Neck: No stridor.  No cervical spine tenderness to palpation. Hematological/Lymphatic/Immunilogical: No cervical lymphadenopathy. Cardiovascular: Normal rate, regular rhythm. Grossly  normal heart sounds.  Good peripheral circulation. Respiratory: Normal respiratory effort.  No retractions. Lungs CTAB. Gastrointestinal: Soft and nontender. No distention. No abdominal bruits. Right CVA tenderness. Genitourinary:  Musculoskeletal: Surgical scars consistent with patient history a fusion with internal fixation. Spinal processes nontender palpation. Neurologic:  Normal speech and language. No gross focal neurologic deficits are  appreciated. No gait instability. Skin:  Skin is warm, dry and intact. No rash noted. Psychiatric: Mood and affect are normal. Speech and behavior are normal.  ____________________________________________   LABS (all labs ordered are listed, but only abnormal results are displayed)  Labs Reviewed  URINALYSIS COMPLETEWITH MICROSCOPIC ([place]) - Abnormal; Notable for the following:    Color, Urine STRAW (*)    APPearance CLEAR (*)    Specific Gravity, Urine 1.004 (*)    Squamous Epithelial / LPF 0-5 (*)    All other components within normal limits   ____________________________________________  EKG   ____________________________________________  RADIOLOGY   ____________________________________________   PROCEDURES  Procedure(s) performed: None  Critical Care performed: No  ____________________________________________   INITIAL IMPRESSION / ASSESSMENT AND PLAN / ED COURSE  Pertinent labs & imaging results that were available during my care of the patient were reviewed by me and considered in my medical decision making (see chart for details).  Right flank pain. Sinus headache. Patient advised to continue taking Zithromax as directed. Patient given a prescription for Ultracet and advised to follow-up family doctor. Return to ER if condition worsens. ____________________________________________   FINAL CLINICAL IMPRESSION(S) / ED DIAGNOSES  Final diagnoses:  Right flank pain  Sinus headache      Sable Feil, PA-C 08/12/15 1844  Carrie Mew, MD 08/12/15 2202

## 2015-08-12 NOTE — Discharge Instructions (Signed)
Continue Zithromax as directed. Take Ultracet as needed for headache and flank pain.

## 2015-08-12 NOTE — ED Notes (Signed)
Pt to ed with c/o back pain that started on Friday intermittently, again on Sunday intermittently and then again about 1 hour pta.  Pt denies pain or difficulty with urination.

## 2015-08-12 NOTE — ED Notes (Signed)
Patient states she takes hydrocodone at home.

## 2015-08-14 DIAGNOSIS — R1084 Generalized abdominal pain: Secondary | ICD-10-CM | POA: Diagnosis not present

## 2015-08-14 DIAGNOSIS — K5900 Constipation, unspecified: Secondary | ICD-10-CM | POA: Diagnosis not present

## 2015-08-14 DIAGNOSIS — I10 Essential (primary) hypertension: Secondary | ICD-10-CM | POA: Diagnosis not present

## 2015-08-14 DIAGNOSIS — M5441 Lumbago with sciatica, right side: Secondary | ICD-10-CM | POA: Diagnosis not present

## 2015-08-14 DIAGNOSIS — K648 Other hemorrhoids: Secondary | ICD-10-CM | POA: Diagnosis not present

## 2015-08-28 DIAGNOSIS — Z23 Encounter for immunization: Secondary | ICD-10-CM | POA: Diagnosis not present

## 2015-09-18 DIAGNOSIS — L508 Other urticaria: Secondary | ICD-10-CM | POA: Diagnosis not present

## 2015-09-24 ENCOUNTER — Other Ambulatory Visit: Payer: Self-pay | Admitting: Neurosurgery

## 2015-09-24 DIAGNOSIS — M47816 Spondylosis without myelopathy or radiculopathy, lumbar region: Secondary | ICD-10-CM

## 2015-09-29 DIAGNOSIS — J0111 Acute recurrent frontal sinusitis: Secondary | ICD-10-CM | POA: Diagnosis not present

## 2015-09-29 DIAGNOSIS — I10 Essential (primary) hypertension: Secondary | ICD-10-CM | POA: Diagnosis not present

## 2015-09-29 DIAGNOSIS — J3481 Nasal mucositis (ulcerative): Secondary | ICD-10-CM | POA: Diagnosis not present

## 2015-09-29 DIAGNOSIS — K5900 Constipation, unspecified: Secondary | ICD-10-CM | POA: Diagnosis not present

## 2015-09-29 DIAGNOSIS — N390 Urinary tract infection, site not specified: Secondary | ICD-10-CM | POA: Diagnosis not present

## 2015-09-30 ENCOUNTER — Ambulatory Visit
Admission: RE | Admit: 2015-09-30 | Discharge: 2015-09-30 | Disposition: A | Discharge status: Home / Self Care | Payer: Medicare Other | Source: Ambulatory Visit | Attending: Neurosurgery | Admitting: Neurosurgery

## 2015-09-30 DIAGNOSIS — M5136 Other intervertebral disc degeneration, lumbar region: Secondary | ICD-10-CM | POA: Diagnosis not present

## 2015-09-30 DIAGNOSIS — M47816 Spondylosis without myelopathy or radiculopathy, lumbar region: Secondary | ICD-10-CM | POA: Insufficient documentation

## 2015-10-21 ENCOUNTER — Ambulatory Visit: Payer: Medicare Other

## 2015-10-28 ENCOUNTER — Other Ambulatory Visit: Payer: Self-pay | Admitting: Internal Medicine

## 2015-10-28 ENCOUNTER — Ambulatory Visit
Admission: RE | Admit: 2015-10-28 | Discharge: 2015-10-28 | Disposition: A | Discharge status: Home / Self Care | Payer: Medicare Other | Source: Ambulatory Visit | Attending: Internal Medicine | Admitting: Internal Medicine

## 2015-10-28 DIAGNOSIS — Z1231 Encounter for screening mammogram for malignant neoplasm of breast: Secondary | ICD-10-CM | POA: Diagnosis present

## 2015-10-30 DIAGNOSIS — R040 Epistaxis: Secondary | ICD-10-CM | POA: Diagnosis not present

## 2015-10-30 DIAGNOSIS — H9202 Otalgia, left ear: Secondary | ICD-10-CM | POA: Diagnosis not present

## 2015-10-30 DIAGNOSIS — M26623 Arthralgia of bilateral temporomandibular joint: Secondary | ICD-10-CM | POA: Diagnosis not present

## 2015-10-30 DIAGNOSIS — H6122 Impacted cerumen, left ear: Secondary | ICD-10-CM | POA: Diagnosis not present

## 2015-10-30 DIAGNOSIS — R0981 Nasal congestion: Secondary | ICD-10-CM | POA: Diagnosis not present

## 2015-11-14 DIAGNOSIS — R399 Unspecified symptoms and signs involving the genitourinary system: Secondary | ICD-10-CM | POA: Diagnosis not present

## 2015-11-25 DIAGNOSIS — E039 Hypothyroidism, unspecified: Secondary | ICD-10-CM | POA: Diagnosis not present

## 2015-11-25 DIAGNOSIS — I10 Essential (primary) hypertension: Secondary | ICD-10-CM | POA: Diagnosis not present

## 2015-11-25 DIAGNOSIS — K5900 Constipation, unspecified: Secondary | ICD-10-CM | POA: Diagnosis not present

## 2015-11-25 DIAGNOSIS — T8069XS Other serum reaction due to other serum, sequela: Secondary | ICD-10-CM | POA: Diagnosis not present

## 2015-11-25 DIAGNOSIS — D509 Iron deficiency anemia, unspecified: Secondary | ICD-10-CM | POA: Diagnosis not present

## 2015-11-25 DIAGNOSIS — J0111 Acute recurrent frontal sinusitis: Secondary | ICD-10-CM | POA: Diagnosis not present

## 2015-11-25 DIAGNOSIS — N390 Urinary tract infection, site not specified: Secondary | ICD-10-CM | POA: Diagnosis not present

## 2015-11-25 DIAGNOSIS — B370 Candidal stomatitis: Secondary | ICD-10-CM | POA: Diagnosis not present

## 2015-11-25 DIAGNOSIS — E538 Deficiency of other specified B group vitamins: Secondary | ICD-10-CM | POA: Diagnosis not present

## 2015-11-25 DIAGNOSIS — R5381 Other malaise: Secondary | ICD-10-CM | POA: Diagnosis not present

## 2015-12-01 ENCOUNTER — Emergency Department
Admission: EM | Admit: 2015-12-01 | Discharge: 2015-12-01 | Disposition: A | Discharge status: Home / Self Care | Payer: Medicare Other | Attending: Emergency Medicine | Admitting: Emergency Medicine

## 2015-12-01 ENCOUNTER — Emergency Department: Payer: Medicare Other

## 2015-12-01 DIAGNOSIS — H509 Unspecified strabismus: Secondary | ICD-10-CM | POA: Insufficient documentation

## 2015-12-01 DIAGNOSIS — R51 Headache: Secondary | ICD-10-CM | POA: Diagnosis not present

## 2015-12-01 DIAGNOSIS — M545 Low back pain: Secondary | ICD-10-CM | POA: Diagnosis not present

## 2015-12-01 DIAGNOSIS — I10 Essential (primary) hypertension: Secondary | ICD-10-CM | POA: Diagnosis not present

## 2015-12-01 DIAGNOSIS — G8929 Other chronic pain: Secondary | ICD-10-CM | POA: Insufficient documentation

## 2015-12-01 DIAGNOSIS — J329 Chronic sinusitis, unspecified: Secondary | ICD-10-CM | POA: Diagnosis not present

## 2015-12-01 DIAGNOSIS — H9203 Otalgia, bilateral: Secondary | ICD-10-CM | POA: Insufficient documentation

## 2015-12-01 DIAGNOSIS — R079 Chest pain, unspecified: Secondary | ICD-10-CM | POA: Diagnosis not present

## 2015-12-01 DIAGNOSIS — R0981 Nasal congestion: Secondary | ICD-10-CM | POA: Diagnosis present

## 2015-12-01 DIAGNOSIS — R0989 Other specified symptoms and signs involving the circulatory and respiratory systems: Secondary | ICD-10-CM | POA: Diagnosis not present

## 2015-12-01 LAB — CBC
HCT: 40.9 % (ref 35.0–47.0)
Hemoglobin: 13.5 g/dL (ref 12.0–16.0)
MCH: 28.6 pg (ref 26.0–34.0)
MCHC: 33.1 g/dL (ref 32.0–36.0)
MCV: 86.2 fL (ref 80.0–100.0)
PLATELETS: 227 10*3/uL (ref 150–440)
RBC: 4.74 MIL/uL (ref 3.80–5.20)
RDW: 14.2 % (ref 11.5–14.5)
WBC: 9.1 10*3/uL (ref 3.6–11.0)

## 2015-12-01 LAB — BASIC METABOLIC PANEL
Anion gap: 6 (ref 5–15)
BUN: 12 mg/dL (ref 6–20)
CALCIUM: 10 mg/dL (ref 8.9–10.3)
CO2: 26 mmol/L (ref 22–32)
CREATININE: 0.54 mg/dL (ref 0.44–1.00)
Chloride: 100 mmol/L — ABNORMAL LOW (ref 101–111)
GFR calc non Af Amer: >60 mL/min (ref >60)
GLUCOSE: 82 mg/dL (ref 65–99)
Potassium: 4.2 mmol/L (ref 3.5–5.1)
Sodium: 132 mmol/L — ABNORMAL LOW (ref 135–145)

## 2015-12-01 LAB — TROPONIN I

## 2015-12-01 NOTE — ED Notes (Addendum)
Pt reports LEFT ear pain w/o drainage; states dried blood from RIGHT ear yesterday. Pt also reports chest pain yesterday that has since subsided, pt (+) chills.

## 2015-12-01 NOTE — ED Notes (Signed)
Pt c/o sinus and chest congestion with BL ear pain/drainage.. Pt also c/o intermittent substernal chest pain for the past week with sweats/chills.[name]

## 2015-12-01 NOTE — Discharge Instructions (Signed)
As we discussed, your lab work today is reassuring as is your physical exam.  Because your symptoms have been going on for 2 months, we feel your best option is to follow-up with the ENT doctor at the next available opportunity to discuss your symptoms and additional management options.  Changing her medications during this one visit in the emergency department without a clear indication to do so may cause more harm than good.  If you develop new or worsening problems that concern you, please return to the emergency department.   Sinus Headache A sinus headache occurs when the paranasal sinuses become clogged or swollen. Paranasal sinuses are air pockets within the bones of the face. Sinus headaches can range from mild to severe. CAUSES A sinus headache can result from various conditions that affect the sinuses, such as:  Colds.  Sinus infections.  Allergies. SYMPTOMS The main symptom of this condition is a headache that may feel like pain or pressure in the face, forehead, ears, or upper teeth. People who have a sinus headache often have other symptoms, such as:  Congested or runny nose.  Fever.  Inability to smell. Weather changes can make symptoms worse. DIAGNOSIS This condition may be diagnosed based on:  A physical exam and medical history.  Imaging tests, such as a CT scan and MRI, to check for problems with the sinuses.  A specialist may look into the sinuses with a tool that has a camera (endoscopy). TREATMENT Treatment for this condition depends on the cause.  Sinus pain that is caused by a sinus infection may be treated with antibiotic medicine.  Sinus pain that is caused by allergies may be helped by allergy medicines (antihistamines) and medicated nasal sprays.  Sinus pain that is caused by congestion may be helped by flushing the nose and sinuses with saline solution. HOME CARE INSTRUCTIONS  Take medicines only as directed by your health care provider.  If you  were prescribed an antibiotic medicine, finish all of it even if you start to feel better.  If you have congestion, use a nasal spray to help reduce pressure.  If directed, apply a warm, moist washcloth to your face to help relieve pain. SEEK MEDICAL CARE IF:  You have headaches more than one time each week.  You have sensitivity to light or sound.  You have a fever.  You feel sick to your stomach (nauseous) or you throw up (vomit).  Your headaches do not get better with treatment. Many people think that they have a sinus headache when they actually have migraines or tension headaches. SEEK IMMEDIATE MEDICAL CARE IF:  You have vision problems.  You have sudden, severe pain in your face or head.  You have a seizure.  You are confused.  You have a stiff neck.   This information is not intended to replace advice given to you by your health care provider. Make sure you discuss any questions you have with your health care provider.   Document Released: 11/04/2004 Document Revised: 02/11/2015 Document Reviewed: 09/23/2014 Elsevier Interactive Patient Education 2016 Elsevier [employer]  Sinusitis, Adult Sinusitis is redness, soreness, and puffiness (inflammation) of the air pockets in the bones of your face (sinuses). The redness, soreness, and puffiness can cause air and mucus to get trapped in your sinuses. This can allow germs to grow and cause an infection.  HOME CARE   Drink enough fluids to keep your pee (urine) clear or pale yellow.  Use a humidifier in your home.  Run a hot shower to create steam in the bathroom. Sit in the bathroom with the door closed. Breathe in the steam 3-4 times a day.  Put a warm, moist washcloth on your face 3-4 times a day, or as told by your doctor.  Use salt water sprays (saline sprays) to wet the thick fluid in your nose. This can help the sinuses drain.  Only take medicine as told by your doctor. GET HELP RIGHT AWAY IF:   Your pain gets  worse.  You have very bad headaches.  You are sick to your stomach (nauseous).  You throw up (vomit).  You are very sleepy (drowsy) all the time.  Your face is puffy (swollen).  Your vision changes.  You have a stiff neck.  You have trouble breathing. MAKE SURE YOU:   Understand these instructions.  Will watch your condition.  Will get help right away if you are not doing well or get worse.   This information is not intended to replace advice given to you by your health care provider. Make sure you discuss any questions you have with your health care provider.   Document Released: 03/15/2008 Document Revised: 10/18/2014 Document Reviewed: 05/02/2012 Elsevier Interactive Patient Education [employer].

## 2015-12-01 NOTE — ED Provider Notes (Signed)
[facility] Emergency Department Provider Note  ____________________________________________  Time seen: Approximately 11:04 PM  I have reviewed the triage vital signs and the nursing notes.   HISTORY  Chief Complaint Nasal Congestion; Otalgia; and Chest Pain    HPI [name] is a 82 y.o. female with history of chronic sinusitis and chronic low back pain managed at the pain management clinic who presents with 2 months of nasal congestion, sinus pressure, and discomfort in her ears left worse than right.  She states she has seen her primary care doctor multiple times and is finishing her second course of cefuroxime.  She has seen Dr. Pryor Ochoa at least once who did a procedure on her nose.  She states that her symptoms are no worse tonight than they have been over the last 2 months, she is simply frustrated with the constant pressure and the headache that goes with it.  She thought she would come in for evaluation to see if we can fix the problems that have been going on for long term.  She states that she had some drainage in her right ear that looked bloody yesterday but that has resolved.  She describes her symptoms as severe and nothing is making them better or worse.  She occasionally has intermittent chest pain but that also has been constant for 2 months.  She denies shortness of breath, abdominal pain, nausea/vomiting, dysuria.  She has been having subjective chills for 2 months as well.Symptoms were gradual in onset and persistent over an extended period of time.   Past Medical History  Diagnosis Date  . High cholesterol   . Hypertension   . Anxiety   . Chronic back pain     There are no active problems to display for this patient.   Past Surgical History  Procedure Laterality Date  . Back surgery    . Breast biopsy Right 2002    neg    Current Outpatient Rx  Name  Route  Sig  Dispense  Refill  . traMADol-acetaminophen (ULTRACET) 37.5-325 MG  tablet   Oral   Take 1 tablet by mouth every 8 (eight) hours as needed for moderate pain.   30 tablet   0     Allergies Cortisone; Morphine and related; Prednisone; and Sulfa antibiotics  Family History  Problem Relation Age of Onset  . Breast cancer Sister 31  . Breast cancer Maternal Grandmother 40  . Breast cancer Sister 39    Social History Social History  Substance Use Topics  . Smoking status: Never Smoker   . Smokeless tobacco: None  . Alcohol Use: No    Review of Systems Constitutional: Subjective fever/chills 2 months Eyes: No visual changes. ENT: No sore throat.  Chronic bilateral ear pain and sensation of fullness.  Chronic pain in sinuses and nose. Cardiovascular: Chronic intermittent brief mild chest pain. Respiratory: Denies shortness of breath. Gastrointestinal: No abdominal pain.  No nausea, no vomiting.  No diarrhea.  No constipation. Genitourinary: Negative for dysuria. Musculoskeletal: Negative for back pain. Skin: Negative for rash. Neurological: Negative for headaches, focal weakness or numbness.  10-point ROS otherwise negative.  ____________________________________________   PHYSICAL EXAM:  VITAL SIGNS: ED Triage Vitals  Enc Vitals Group     BP 12/01/15 1823 126/84 mmHg     Pulse Rate 12/01/15 1823 104     Resp 12/01/15 1823 18     Temp 12/01/15 1823 98 F (36.7 C)     Temp Source 12/01/15 1823  Oral     SpO2 12/01/15 1823 96 %     Weight 12/01/15 1828 134 lb (60.782 kg)     Height 12/01/15 1828 5' (1.524 m)     Head Cir --      Peak Flow --      Pain Score 12/01/15 1828 8     Pain Loc --      Pain Edu? --      Excl. in [place]? --     Constitutional: Alert and oriented. Well appearing for age and in no acute distress.  Eyes: Conjunctivae are normal. PERRL. EOMI.  Mild strabismus at baseline Head: Atraumatic. Nose/Ears: No congestion/rhinnorhea.  No epistaxis, no lesions noted upon visualization with otoscope.  Her ears are clear  bilaterally with well-appearing TMs.  There is no drainage or evidence of blood/hemotympanum. Mouth/Throat: Mucous membranes are moist.  Oropharynx non-erythematous. Neck: No stridor.  No meningeal signs. Cardiovascular: Normal rate, regular rhythm. Grossly normal heart sounds.  Good peripheral circulation. Respiratory: Normal respiratory effort.  No retractions. Lungs CTAB. Gastrointestinal: Soft and nontender. No distention. No abdominal bruits. No CVA tenderness. Musculoskeletal: No lower extremity tenderness nor edema.  No joint effusions. Neurologic:  Normal speech and language. No gross focal neurologic deficits are appreciated.  Skin:  Skin is warm, dry and intact. No rash noted. Psychiatric: Mood and affect are normal. Speech and behavior are normal.  ____________________________________________   LABS (all labs ordered are listed, but only abnormal results are displayed)  Labs Reviewed  BASIC METABOLIC PANEL - Abnormal; Notable for the following:    Sodium 132 (*)    Chloride 100 (*)    All other components within normal limits  CBC  TROPONIN I   ____________________________________________  EKG  ED ECG REPORT I, Rolan Wrightsman, the attending physician, personally viewed and interpreted this ECG.   Date: 12/01/2015  EKG Time: 18:37  Rate: 95  Rhythm: normal sinus rhythm with first-degree AV block  Axis: Normal  Intervals:first-degree A-V block   ST&T Change: No evidence of acute ischemia  ____________________________________________  RADIOLOGY   Dg Chest 2 View  12/01/2015  CLINICAL DATA:  Chest pain for 1 week.  Chest congestion. EXAM: CHEST  2 VIEW COMPARISON:  Most recent chest radiograph 05/03/2014 FINDINGS: Cardiomegaly and tortuous thoracic aorta, unchanged from prior exam. Mild lingular scarring also unchanged. No consolidation, pleural effusion, pulmonary edema or pneumothorax. Minimal anterior wedging of mid thoracic vertebral body, unchanged. IMPRESSION:  1.  No acute pulmonary process. 2. Stable cardiomegaly and tortuous thoracic aorta. Electronically Signed   By: Jeb Levering M.D.   On: 12/01/2015 19:09    ____________________________________________   PROCEDURES  Procedure(s) performed: None  Critical Care performed: No ____________________________________________   INITIAL IMPRESSION / ASSESSMENT AND PLAN / ED COURSE  Pertinent labs & imaging results that were available during my care of the patient were reviewed by me and considered in my medical decision making (see chart for details).  The patient has had chronic symptoms for 2 months with no acute changes.  She is actively being treated by her primary care doctor and has seen ENT in the recent past.  I discussed the fact that there is no emergent medical condition that I can treat at this time with her and her daughters, and expressed my concern that changing her regimen or adding new medications without a clear indication to do so would be detrimental for her, particularly at her age.  She is starting out of pain medication regimen  at the pain management clinic and I am not comfortable changing that for a chronic issue as well.  I explained that given all of her symptoms are in her doses, ears, nose, I recommend close follow-up with Dr. Pryor Ochoa.  The patient is frustrated but her daughters understand and agree with the plan for follow-up.  I gave my usual and customary return precautions.     ____________________________________________  FINAL CLINICAL IMPRESSION(S) / ED DIAGNOSES  Final diagnoses:  Chronic sinusitis, unspecified location  Chronic nonintractable headache, unspecified headache type      NEW MEDICATIONS STARTED DURING THIS VISIT:  New Prescriptions   No medications on file     Hinda Kehr, MD 12/01/15 2330

## 2015-12-15 ENCOUNTER — Emergency Department: Payer: Medicare Other

## 2015-12-15 ENCOUNTER — Inpatient Hospital Stay
Admission: EM | Admit: 2015-12-15 | Discharge: 2015-12-19 | DRG: 644 | Disposition: A | Discharge status: Home / Self Care | Payer: Medicare Other | Attending: Internal Medicine | Admitting: Internal Medicine

## 2015-12-15 DIAGNOSIS — G8929 Other chronic pain: Secondary | ICD-10-CM | POA: Diagnosis present

## 2015-12-15 DIAGNOSIS — F419 Anxiety disorder, unspecified: Secondary | ICD-10-CM | POA: Diagnosis present

## 2015-12-15 DIAGNOSIS — M545 Low back pain: Secondary | ICD-10-CM | POA: Diagnosis present

## 2015-12-15 DIAGNOSIS — E871 Hypo-osmolality and hyponatremia: Secondary | ICD-10-CM | POA: Diagnosis present

## 2015-12-15 DIAGNOSIS — E785 Hyperlipidemia, unspecified: Secondary | ICD-10-CM | POA: Diagnosis not present

## 2015-12-15 DIAGNOSIS — N390 Urinary tract infection, site not specified: Secondary | ICD-10-CM | POA: Diagnosis present

## 2015-12-15 DIAGNOSIS — R0602 Shortness of breath: Secondary | ICD-10-CM | POA: Diagnosis not present

## 2015-12-15 DIAGNOSIS — R531 Weakness: Secondary | ICD-10-CM

## 2015-12-15 DIAGNOSIS — E7800 Pure hypercholesterolemia, unspecified: Secondary | ICD-10-CM | POA: Diagnosis present

## 2015-12-15 DIAGNOSIS — Z882 Allergy status to sulfonamides status: Secondary | ICD-10-CM

## 2015-12-15 DIAGNOSIS — E222 Syndrome of inappropriate secretion of antidiuretic hormone: Secondary | ICD-10-CM | POA: Diagnosis not present

## 2015-12-15 DIAGNOSIS — Z885 Allergy status to narcotic agent status: Secondary | ICD-10-CM

## 2015-12-15 DIAGNOSIS — I10 Essential (primary) hypertension: Secondary | ICD-10-CM | POA: Diagnosis not present

## 2015-12-15 DIAGNOSIS — Z888 Allergy status to other drugs, medicaments and biological substances status: Secondary | ICD-10-CM

## 2015-12-15 DIAGNOSIS — E876 Hypokalemia: Secondary | ICD-10-CM | POA: Diagnosis not present

## 2015-12-15 DIAGNOSIS — R339 Retention of urine, unspecified: Secondary | ICD-10-CM | POA: Diagnosis present

## 2015-12-15 DIAGNOSIS — Z79891 Long term (current) use of opiate analgesic: Secondary | ICD-10-CM

## 2015-12-15 DIAGNOSIS — E860 Dehydration: Secondary | ICD-10-CM | POA: Diagnosis present

## 2015-12-15 HISTORY — DX: Pneumonia, unspecified organism: J18.9

## 2015-12-15 HISTORY — DX: Personal history of other specified conditions: Z87.898

## 2015-12-15 LAB — URINALYSIS COMPLETE WITH MICROSCOPIC (ARMC ONLY)
BACTERIA UA: NONE SEEN
Bilirubin Urine: NEGATIVE
Glucose, UA: NEGATIVE mg/dL
Hgb urine dipstick: NEGATIVE
Ketones, ur: NEGATIVE mg/dL
LEUKOCYTES UA: NEGATIVE
Nitrite: NEGATIVE
PH: 7 (ref 5.0–8.0)
PROTEIN: NEGATIVE mg/dL
SQUAMOUS EPITHELIAL / LPF: NONE SEEN
Specific Gravity, Urine: 1.006 (ref 1.005–1.030)

## 2015-12-15 LAB — COMPREHENSIVE METABOLIC PANEL
ALK PHOS: 63 U/L (ref 38–126)
ALT: 17 U/L (ref 14–54)
ANION GAP: 7 (ref 5–15)
AST: 27 U/L (ref 15–41)
Albumin: 4.4 g/dL (ref 3.5–5.0)
BUN: 12 mg/dL (ref 6–20)
CALCIUM: 9 mg/dL (ref 8.9–10.3)
CHLORIDE: 91 mmol/L — AB (ref 101–111)
CO2: 25 mmol/L (ref 22–32)
CREATININE: 0.54 mg/dL (ref 0.44–1.00)
Glucose, Bld: 103 mg/dL — ABNORMAL HIGH (ref 65–99)
Potassium: 3.6 mmol/L (ref 3.5–5.1)
SODIUM: 123 mmol/L — AB (ref 135–145)
Total Bilirubin: 0.8 mg/dL (ref 0.3–1.2)
Total Protein: 7.1 g/dL (ref 6.5–8.1)

## 2015-12-15 LAB — CBC WITH DIFFERENTIAL/PLATELET
Basophils Absolute: 0 10*3/uL (ref 0–0.1)
Basophils Relative: 1 %
EOS ABS: 0.3 10*3/uL (ref 0–0.7)
EOS PCT: 4 %
HCT: 38.1 % (ref 35.0–47.0)
Hemoglobin: 12.9 g/dL (ref 12.0–16.0)
LYMPHS ABS: 1.4 10*3/uL (ref 1.0–3.6)
LYMPHS PCT: 21 %
MCH: 28.5 pg (ref 26.0–34.0)
MCHC: 33.7 g/dL (ref 32.0–36.0)
MCV: 84.4 fL (ref 80.0–100.0)
MONO ABS: 0.9 10*3/uL (ref 0.2–0.9)
Monocytes Relative: 13 %
Neutro Abs: 4.1 10*3/uL (ref 1.4–6.5)
Neutrophils Relative %: 61 %
PLATELETS: 167 10*3/uL (ref 150–440)
RBC: 4.52 MIL/uL (ref 3.80–5.20)
RDW: 13.8 % (ref 11.5–14.5)
WBC: 6.7 10*3/uL (ref 3.6–11.0)

## 2015-12-15 LAB — OSMOLALITY, URINE: Osmolality, Ur: 289 mOsm/kg — ABNORMAL LOW (ref 300–900)

## 2015-12-15 LAB — BRAIN NATRIURETIC PEPTIDE: B NATRIURETIC PEPTIDE 5: 113 pg/mL — AB (ref 0.0–100.0)

## 2015-12-15 LAB — OSMOLALITY: Osmolality: 257 mOsm/kg — ABNORMAL LOW (ref 275–295)

## 2015-12-15 LAB — TROPONIN I

## 2015-12-15 MED ORDER — TAMSULOSIN HCL 0.4 MG PO CAPS
0.4000 mg | ORAL_CAPSULE | Freq: Every day | ORAL | Status: DC
  Administered 2015-12-15 – 2015-12-19 (×5): 0.4 mg via ORAL
  Filled 2015-12-15 (×5): qty 1

## 2015-12-15 MED ORDER — ONDANSETRON HCL 4 MG/2ML IJ SOLN: 4.0000 mg | Freq: Four times a day (QID) | INTRAMUSCULAR | Status: DC | PRN

## 2015-12-15 MED ORDER — ONDANSETRON HCL 4 MG PO TABS: 4.0000 mg | ORAL_TABLET | Freq: Four times a day (QID) | ORAL | Status: DC | PRN

## 2015-12-15 MED ORDER — HYDROCODONE-ACETAMINOPHEN 10-325 MG PO TABS
1.0000 | ORAL_TABLET | Freq: Four times a day (QID) | ORAL | Status: DC | PRN
  Administered 2015-12-15 – 2015-12-17 (×4): 1 via ORAL
  Filled 2015-12-15 (×4): qty 1

## 2015-12-15 MED ORDER — TIZANIDINE HCL 2 MG PO TABS
1.0000 mg | ORAL_TABLET | Freq: Two times a day (BID) | ORAL | Status: DC | PRN
  Filled 2015-12-15: qty 1

## 2015-12-15 MED ORDER — DOCUSATE SODIUM 100 MG PO CAPS
100.0000 mg | ORAL_CAPSULE | Freq: Two times a day (BID) | ORAL | Status: DC
  Administered 2015-12-15 – 2015-12-19 (×9): 100 mg via ORAL
  Filled 2015-12-15 (×9): qty 1

## 2015-12-15 MED ORDER — CARVEDILOL 6.25 MG PO TABS
6.2500 mg | ORAL_TABLET | Freq: Two times a day (BID) | ORAL | Status: DC
  Administered 2015-12-16 – 2015-12-19 (×6): 6.25 mg via ORAL
  Filled 2015-12-15 (×7): qty 1

## 2015-12-15 MED ORDER — AMLODIPINE BESYLATE 5 MG PO TABS
5.0000 mg | ORAL_TABLET | Freq: Every day | ORAL | Status: DC
  Administered 2015-12-16 – 2015-12-19 (×3): 5 mg via ORAL
  Filled 2015-12-15 (×5): qty 1

## 2015-12-15 MED ORDER — DIAZEPAM 5 MG/ML IJ SOLN
1.0000 mg | Freq: Once | INTRAMUSCULAR | Status: AC
  Administered 2015-12-15: 1 mg via INTRAVENOUS
  Filled 2015-12-15: qty 2

## 2015-12-15 MED ORDER — ACETAMINOPHEN 325 MG PO TABS
650.0000 mg | ORAL_TABLET | Freq: Four times a day (QID) | ORAL | Status: DC | PRN
  Administered 2015-12-15: 650 mg via ORAL
  Filled 2015-12-15: qty 2

## 2015-12-15 MED ORDER — ACETAMINOPHEN 650 MG RE SUPP: 650.0000 mg | Freq: Four times a day (QID) | RECTAL | Status: DC | PRN

## 2015-12-15 MED ORDER — ALBUTEROL SULFATE (2.5 MG/3ML) 0.083% IN NEBU
2.5000 mg | INHALATION_SOLUTION | Freq: Once | RESPIRATORY_TRACT | Status: AC
  Administered 2015-12-15: 2.5 mg via RESPIRATORY_TRACT
  Filled 2015-12-15: qty 3

## 2015-12-15 MED ORDER — SODIUM CHLORIDE 0.9 % IV BOLUS (SEPSIS)
500.0000 mL | Freq: Once | INTRAVENOUS | Status: AC
  Administered 2015-12-15: 500 mL via INTRAVENOUS

## 2015-12-15 MED ORDER — MIRTAZAPINE 15 MG PO TABS
30.0000 mg | ORAL_TABLET | Freq: Every day | ORAL | Status: DC
  Administered 2015-12-15 – 2015-12-18 (×4): 30 mg via ORAL
  Filled 2015-12-15 (×4): qty 2

## 2015-12-15 MED ORDER — BENZONATATE 100 MG PO CAPS
100.0000 mg | ORAL_CAPSULE | Freq: Two times a day (BID) | ORAL | Status: DC | PRN
  Administered 2015-12-15 – 2015-12-18 (×4): 100 mg via ORAL
  Filled 2015-12-15 (×6): qty 1

## 2015-12-15 MED ORDER — LORAZEPAM 0.5 MG PO TABS
0.5000 mg | ORAL_TABLET | Freq: Three times a day (TID) | ORAL | Status: DC | PRN
  Administered 2015-12-15 – 2015-12-17 (×4): 0.5 mg via ORAL
  Filled 2015-12-15 (×4): qty 1

## 2015-12-15 MED ORDER — LORAZEPAM 0.5 MG PO TABS
0.5000 mg | ORAL_TABLET | Freq: Once | ORAL | Status: AC
  Administered 2015-12-15: 0.5 mg via ORAL
  Filled 2015-12-15: qty 1

## 2015-12-15 MED ORDER — KETOROLAC TROMETHAMINE 30 MG/ML IJ SOLN
10.0000 mg | Freq: Once | INTRAMUSCULAR | Status: AC
  Administered 2015-12-15: 9.9 mg via INTRAVENOUS
  Filled 2015-12-15: qty 1

## 2015-12-15 MED ORDER — ENOXAPARIN SODIUM 40 MG/0.4ML ~~LOC~~ SOLN
40.0000 mg | SUBCUTANEOUS | Status: DC
  Administered 2015-12-15 – 2015-12-18 (×4): 40 mg via SUBCUTANEOUS
  Filled 2015-12-15 (×4): qty 0.4

## 2015-12-15 MED ORDER — SODIUM CHLORIDE 0.9 % IV SOLN
INTRAVENOUS | Status: DC
  Administered 2015-12-15 – 2015-12-16 (×3): via INTRAVENOUS

## 2015-12-15 MED ORDER — FLUCONAZOLE 100 MG PO TABS
150.0000 mg | ORAL_TABLET | Freq: Once | ORAL | Status: AC
  Administered 2015-12-15: 150 mg via ORAL
  Filled 2015-12-15: qty 1

## 2015-12-15 MED ORDER — TRAMADOL-ACETAMINOPHEN 37.5-325 MG PO TABS
1.0000 | ORAL_TABLET | Freq: Three times a day (TID) | ORAL | Status: DC | PRN
  Administered 2015-12-16 – 2015-12-19 (×6): 1 via ORAL
  Filled 2015-12-15 (×6): qty 1

## 2015-12-15 NOTE — Progress Notes (Signed)
Pt does get anxious at times.  Ativan does help.

## 2015-12-15 NOTE — ED Provider Notes (Signed)
[facility] Emergency Department Provider Note  ____________________________________________  Time seen: Approximately 3:49 AM  I have reviewed the triage vital signs and the nursing notes.   HISTORY  Chief Complaint Influenza    HPI [name] is a 82 y.o. female who presents to the ED from home with a chief complaint of flulike symptoms. Patient reports flulike symptoms 1 week. Complains of chills, cough productive of yellow sputum, shortness of breath, headache and neck pain. States the headache and neck pain has been ongoing for quite some time as she has finished several rounds of antibiotics for ongoing sinus issues. Recently finished Z-Pak 2 days ago. Denies associated fever, chest pain, abdominal pain, nausea, vomiting, diarrhea. She took one half hydrocodone and Ativan without relief of symptoms prior to arrival. Denies recent travel or trauma.   Past Medical History  Diagnosis Date  . High cholesterol   . Hypertension   . Anxiety   . Chronic back pain     There are no active problems to display for this patient.   Past Surgical History  Procedure Laterality Date  . Back surgery    . Breast biopsy Right 2002    neg    Current Outpatient Rx  Name  Route  Sig  Dispense  Refill  . traMADol-acetaminophen (ULTRACET) 37.5-325 MG tablet   Oral   Take 1 tablet by mouth every 8 (eight) hours as needed for moderate pain.   30 tablet   0     Allergies Cortisone; Morphine and related; Prednisone; and Sulfa antibiotics  Family History  Problem Relation Age of Onset  . Breast cancer Sister 76  . Breast cancer Maternal Grandmother 27  . Breast cancer Sister 70    Social History Social History  Substance Use Topics  . Smoking status: Never Smoker   . Smokeless tobacco: None  . Alcohol Use: No    Review of Systems  Constitutional: Positive for chills. No fever. Eyes: No visual changes. ENT: No sore throat. Cardiovascular:  Denies chest pain. Respiratory: Positive for productive cough and shortness of breath. Gastrointestinal: No abdominal pain.  No nausea, no vomiting.  No diarrhea.  No constipation. Genitourinary: Negative for dysuria. Musculoskeletal: Negative for back pain. Skin: Negative for rash. Neurological: Positive for headaches. Negative for focal weakness or numbness.  10-point ROS otherwise negative.  ____________________________________________   PHYSICAL EXAM:  VITAL SIGNS: ED Triage Vitals  Enc Vitals Group     BP 12/15/15 0241 140/86 mmHg     Pulse Rate 12/15/15 0241 77     Resp 12/15/15 0241 14     Temp 12/15/15 0241 97.8 F (36.6 C)     Temp Source 12/15/15 0241 Oral     SpO2 12/15/15 0239 95 %     Weight 12/15/15 0241 130 lb (58.968 kg)     Height 12/15/15 0241 5' (1.524 m)     Head Cir --      Peak Flow --      Pain Score 12/15/15 0242 6     Pain Loc --      Pain Edu? --      Excl. in [place]? --     Constitutional: Alert and oriented. Well appearing and in no acute distress. Eyes: Conjunctivae are normal. PERRL. EOMI. Head: Atraumatic. Nose: Congestion/rhinnorhea. Mouth/Throat: Mucous membranes are moist.  Oropharynx non-erythematous. Neck: No stridor.  No carotid bruits. Supple neck without meningismus. Hematological/Lymphatic/Immunilogical: No cervical lymphadenopathy. Cardiovascular: Normal rate, regular rhythm. Grossly normal heart  sounds.  Good peripheral circulation. Respiratory: Normal respiratory effort.  No retractions. Lungs with scattered rhonchi. Gastrointestinal: Soft and nontender. No distention. No abdominal bruits. No CVA tenderness. Musculoskeletal: No lower extremity tenderness nor edema.  No joint effusions. Neurologic:  Normal speech and language. No gross focal neurologic deficits are appreciated.  Skin:  Skin is warm, dry and intact. No rash noted. No petechiae. Psychiatric: Mood and affect are normal. Speech and behavior are  normal.  ____________________________________________   LABS (all labs ordered are listed, but only abnormal results are displayed)  Labs Reviewed  COMPREHENSIVE METABOLIC PANEL - Abnormal; Notable for the following:    Sodium 123 (*)    Chloride 91 (*)    Glucose, Bld 103 (*)    All other components within normal limits  URINALYSIS COMPLETEWITH MICROSCOPIC ([facility] ONLY) - Abnormal; Notable for the following:    Color, Urine STRAW (*)    APPearance CLEAR (*)    All other components within normal limits  CBC WITH DIFFERENTIAL/PLATELET  TROPONIN I  BRAIN NATRIURETIC PEPTIDE   ____________________________________________  EKG  ED ECG REPORT I, Teneka Malmberg J, the attending physician, personally viewed and interpreted this ECG.   Date: 12/15/2015  EKG Time: 0239  Rate: 78  Rhythm: normal EKG, normal sinus rhythm  Axis: Normal  Intervals:none  ST&T Change: Nonspecific  ____________________________________________  RADIOLOGY  Chest 2 view (viewed by me, interpreted per Dr. Jeannine Boga): 1. No active cardiopulmonary disease. 2. Stable cardiomegaly with tortuosity the intrathoracic aorta. No pulmonary edema. ____________________________________________   PROCEDURES  Procedure(s) performed: None  Critical Care performed: No  ____________________________________________   INITIAL IMPRESSION / ASSESSMENT AND PLAN / ED COURSE  Pertinent labs & imaging results that were available during my care of the patient were reviewed by me and considered in my medical decision making (see chart for details).  82 year old female who presents with flulike symptoms 1 week with productive cough, headache and shortness of breath. Will obtain screening lab work including troponin, EKG, chest x-ray. Will initiate nebulizer treatment for mild scattered rhonchi heard on exam.  ----------------------------------------- 6:01 AM on  12/15/2015 -----------------------------------------  Updated patient and daughters of laboratory and imaging results. Discussed with hospitalist who will evaluate patient in the emergency department for admission. ____________________________________________   FINAL CLINICAL IMPRESSION(S) / ED DIAGNOSES  Final diagnoses:  Weakness  Hyponatremia      Paulette Blanch, MD 12/15/15 [phone]

## 2015-12-15 NOTE — ED Notes (Signed)
MD at bedside. 

## 2015-12-15 NOTE — ED Notes (Signed)
Pt reports headache and neck hurting for the last week - Pt reports that she had flu like symptoms and "pneumonia" - She reports that she is short of breath but appears in no distress respirations are even and unlabored - Pt reports she took hydrocodone and ativan without relief of symptoms -

## 2015-12-15 NOTE — ED Notes (Signed)
Patient transported to X-ray 

## 2015-12-15 NOTE — Progress Notes (Signed)
[place] at [place] NAME: [name]    MR#:  [id number]  DATE OF BIRTH:  10/11/1925  SUBJECTIVE:   Patient here due to a flulike illness not improved as an outpatient and treated with a Z-Pak recently. Noted to be hyponatremic. Patient denies any nausea, vomiting but does complain of some malaise. No fevers, chills.  REVIEW OF SYSTEMS:    Review of Systems  Constitutional: Positive for malaise/fatigue. Negative for fever and chills.  HENT: Negative for congestion and tinnitus.   Eyes: Negative for blurred vision and double vision.  Respiratory: Negative for cough, shortness of breath and wheezing.   Cardiovascular: Negative for chest pain, orthopnea and PND.  Gastrointestinal: Negative for nausea, vomiting, abdominal pain and diarrhea.  Genitourinary: Negative for dysuria and hematuria.  Neurological: Positive for weakness. Negative for dizziness, sensory change and focal weakness.  All other systems reviewed and are negative.   Nutrition: 2 gram sodium Tolerating Diet: yes Tolerating PT: Ambulatory   DRUG ALLERGIES:   Allergies  Allergen Reactions  . Cortisone Other (See Comments)    Headache   . Morphine And Related Other (See Comments)    headache  . Prednisone Other (See Comments)    Headache   . Sulfa Antibiotics Nausea Only    VITALS:  Blood pressure 144/83, pulse 81, temperature 98 F (36.7 C), temperature source Oral, resp. rate 20, height 5' (1.524 m), weight 58.968 kg (130 lb), SpO2 95 %.  PHYSICAL EXAMINATION:   Physical Exam  GENERAL:  82 y.o.-year-old patient lying in the bed in no acute distress.  EYES: Pupils equal, round, reactive to light and accommodation. No scleral icterus. Extraocular muscles intact.  HEENT: Head atraumatic, normocephalic. Oropharynx and nasopharynx clear.  NECK:  Supple, no jugular venous distention. No thyroid enlargement, no tenderness.  LUNGS: Normal breath  sounds bilaterally, no wheezing, rales, rhonchi. No use of accessory muscles of respiration.  CARDIOVASCULAR: S1, S2 normal. No murmurs, rubs, or gallops.  ABDOMEN: Soft, nontender, nondistended. Bowel sounds present. No organomegaly or mass.  EXTREMITIES: No cyanosis, clubbing or edema b/l.    NEUROLOGIC: Cranial nerves II through XII are intact. No focal Motor or sensory deficits b/l.   PSYCHIATRIC: The patient is alert and oriented x 3. Anxious SKIN: No obvious rash, lesion, or ulcer.    LABORATORY PANEL:   CBC  Recent Labs Lab 12/15/15 0252  WBC 6.7  HGB 12.9  HCT 38.1  PLT 167   ------------------------------------------------------------------------------------------------------------------  Chemistries   Recent Labs Lab 12/15/15 0252  NA 123*  K 3.6  CL 91*  CO2 25  GLUCOSE 103*  BUN 12  CREATININE 0.54  CALCIUM 9.0  AST 27  ALT 17  ALKPHOS 63  BILITOT 0.8   ------------------------------------------------------------------------------------------------------------------  Cardiac Enzymes  Recent Labs Lab 12/15/15 0252  TROPONINI <0.03   ------------------------------------------------------------------------------------------------------------------  RADIOLOGY:  Dg Chest 2 View  12/15/2015  CLINICAL DATA:  Initial evaluation for acute shortness of breath. EXAM: CHEST  2 VIEW COMPARISON:  Prior study from 12/01/2015. FINDINGS: Cardiomegaly is stable. Tortuosity of the intrathoracic aorta again noted, unchanged. Mediastinal silhouette within normal limits. Lungs normally inflated. No focal infiltrate, pulmonary edema, or pleural effusion. No pneumothorax. Mild irregular biapical pleural thickening noted. No acute osseus abnormality. Multilevel degenerative changes within the visualized spine. IMPRESSION: 1. No active cardiopulmonary disease. 2. Stable cardiomegaly with tortuosity the intrathoracic aorta. No pulmonary edema. Electronically Signed   By:  Pincus Badder.D.  On: 12/15/2015 04:27     ASSESSMENT AND PLAN:   82 year old female with past medical history of anxiety, hypertension, hyperlipidemia, chronic back pain, history of urinary retention who presents to the hospital due to generalized weakness and malaise after a flulike illness and noted to be hyponatremic.  #1 hyponatremia - etiology unclear. Euvolemic in nature.  - cont. Gentle IV fluids for now.  Will check Urine, Serum Osm and monitor.  - asymptomatic as mental status stable.  Will follow sodium.   #2 Essential HTN - cont. Coreg, Norvasc.  - BP stable.   #3 Anxiety - cont. Ativan PRN.   #4 hx of Urinary Retention - cont. Flomax.  - pt. Does in & out cath at home.   #5. Chronic Back Pain - cont. Tramadol, Zanaflex.     All the records are reviewed and case discussed with Care Management/Social Workerr. Management plans discussed with the patient, family and they are in agreement.  CODE STATUS: Full   DVT Prophylaxis: Lovenox  TOTAL TIME TAKING CARE OF THIS PATIENT: 25 minutes.   POSSIBLE D/C IN 1-2 DAYS, DEPENDING ON CLINICAL CONDITION.   Henreitta Leber M.D on 12/15/2015 at 11:29 AM  Between 7am to 6pm - Pager - [phone]  After 6pm go to [email or web address] - password [email or web address] [place]  Office  [phone]  CC: Primary care physician; Lavera Guise, MD

## 2015-12-15 NOTE — Progress Notes (Signed)
Pt highly anxious. Takes ativan at home. rn contacted dr Verdell Carmine and ativan .5mg   Ordered. meds reconcilled at [facility]

## 2015-12-15 NOTE — H&P (Addendum)
[place] at [place] NAME: [name]    MR#:  [id number]  DATE OF BIRTH:  14-Dec-1924  DATE OF ADMISSION:  12/15/2015  PRIMARY CARE PHYSICIAN: Lavera Guise, MD   REQUESTING/REFERRING PHYSICIAN:   CHIEF COMPLAINT:   Chief Complaint  Patient presents with  . Influenza    HISTORY OF PRESENT ILLNESS: [name]  is a 82 y.o. female with a known history of hypertension, hyperlipidemia, chronic back pain and anxiety disorder presents to the emergency room with generalized weakness. Patient lives with her daughter at home, she is independent in activities of daily living. Patient complained of flulike symptoms 1 week ago such as myalgias headache and cold and cough .Patient recently completed a course of Z-Pak antibiotic 2 days ago prescribed by her primary care physician. She has generalized weakness and also had an episode of shortness of breath. In the emergency room she was breathing comfortable. No complaints of any chest pain or any orthopnea. No history of fever or chills today. Patient was worked up now is found to have low sodium level. No history of nausea, vomiting, abdominal pain and diarrhea. Hospitalist service was further consulted for further care of the patient. No history of any seizure. History of syncope or loss of consciousness. Patient is hard of hearing and uses hearing aids.  PAST MEDICAL HISTORY:   Past Medical History  Diagnosis Date  . High cholesterol   . Hypertension   . Anxiety   . Chronic back pain     PAST SURGICAL HISTORY: Past Surgical History  Procedure Laterality Date  . Back surgery    . Breast biopsy Right 2002    neg    SOCIAL HISTORY:  Social History  Substance Use Topics  . Smoking status: Never Smoker   . Smokeless tobacco: Not on file  . Alcohol Use: No    FAMILY HISTORY:  Family History  Problem Relation Age of Onset  . Breast cancer Sister 52  . Breast cancer Maternal  Grandmother 82  . Breast cancer Sister 27    DRUG ALLERGIES:  Allergies  Allergen Reactions  . Cortisone Other (See Comments)    Headache   . Morphine And Related Other (See Comments)    headache  . Prednisone Other (See Comments)    Headache   . Sulfa Antibiotics Nausea Only    REVIEW OF SYSTEMS:   CONSTITUTIONAL: No fever, has weakness.  EYES: No blurred or double vision.  EARS, NOSE, AND THROAT: No tinnitus or ear pain.  RESPIRATORY: No cough, an episode of shortness of breath noted,no wheezing or hemoptysis.  CARDIOVASCULAR: No chest pain, orthopnea, edema.  GASTROINTESTINAL: No nausea, vomiting, diarrhea or abdominal pain.  GENITOURINARY: No dysuria, hematuria.  ENDOCRINE: No polyuria, nocturia,  HEMATOLOGY: No anemia, easy bruising or bleeding SKIN: No rash or lesion. MUSCULOSKELETAL: No joint pain or arthritis.   NEUROLOGIC: No tingling, numbness, weakness.  PSYCHIATRY: No anxiety or depression.   MEDICATIONS AT HOME:  Prior to Admission medications   Medication Sig Start Date End Date Taking? Authorizing Provider  traMADol-acetaminophen (ULTRACET) 37.5-325 MG tablet Take 1 tablet by mouth every 8 (eight) hours as needed for moderate pain. 08/12/15   Sable Feil, PA-C      PHYSICAL EXAMINATION:   VITAL SIGNS: Blood pressure 117/78, pulse 75, temperature 97.8 F (36.6 C), temperature source Oral, resp. rate 13, height 5' (1.524 m), weight 58.968 kg (130 lb), SpO2 92 %.  GENERAL:  82 y.o.-year-old patient lying in the bed with no acute distress.  EYES: Pupils equal, round, reactive to light and accommodation. No scleral icterus. Extraocular muscles intact.  HEENT: Head atraumatic, normocephalic. Oropharynx dry and nasopharynx clear.  NECK:  Supple, no jugular venous distention. No thyroid enlargement, no tenderness.  LUNGS: Normal breath sounds bilaterally, no wheezing, rales,rhonchi or crepitation. No use of accessory muscles of respiration.  CARDIOVASCULAR:  S1, S2 normal. No murmurs, rubs, or gallops.  ABDOMEN: Soft, nontender, nondistended. Bowel sounds present. No organomegaly or mass.  EXTREMITIES: No pedal edema, cyanosis, or clubbing.  NEUROLOGIC: Cranial nerves II through XII are intact. Muscle strength 5/5 in all extremities. Sensation intact. Gait normal. Hard of hearing. PSYCHIATRIC: The patient is alert and oriented x 3.  SKIN: No obvious rash, lesion, or ulcer.   LABORATORY PANEL:   CBC  Recent Labs Lab 12/15/15 0252  WBC 6.7  HGB 12.9  HCT 38.1  PLT 167  MCV 84.4  MCH 28.5  MCHC 33.7  RDW 13.8  LYMPHSABS 1.4  MONOABS 0.9  EOSABS 0.3  BASOSABS 0.0   ------------------------------------------------------------------------------------------------------------------  Chemistries   Recent Labs Lab 12/15/15 0252  NA 123*  K 3.6  CL 91*  CO2 25  GLUCOSE 103*  BUN 12  CREATININE 0.54  CALCIUM 9.0  AST 27  ALT 17  ALKPHOS 63  BILITOT 0.8   ------------------------------------------------------------------------------------------------------------------ estimated creatinine clearance is 39.1 mL/min (by C-G formula based on Cr of 0.54). ------------------------------------------------------------------------------------------------------------------ No results for input(s): TSH, T4TOTAL, T3FREE, THYROIDAB in the last 72 hours.  Invalid input(s): FREET3   Coagulation profile No results for input(s): INR, PROTIME in the last 168 hours. ------------------------------------------------------------------------------------------------------------------- No results for input(s): DDIMER in the last 72 hours. -------------------------------------------------------------------------------------------------------------------  Cardiac Enzymes  Recent Labs Lab 12/15/15 0252  TROPONINI <0.03   ------------------------------------------------------------------------------------------------------------------ Invalid  input(s): POCBNP  ---------------------------------------------------------------------------------------------------------------  Urinalysis    Component Value Date/Time   COLORURINE STRAW* 12/15/2015 0252   COLORURINE Yellow 08/05/2014 0115   APPEARANCEUR CLEAR* 12/15/2015 0252   APPEARANCEUR Clear 08/05/2014 0115   LABSPEC 1.006 12/15/2015 0252   LABSPEC 1.004 08/05/2014 0115   PHURINE 7.0 12/15/2015 0252   PHURINE 7.0 08/05/2014 0115   GLUCOSEU NEGATIVE 12/15/2015 0252   GLUCOSEU Negative 08/05/2014 0115   HGBUR NEGATIVE 12/15/2015 0252   HGBUR Negative 08/05/2014 0115   BILIRUBINUR NEGATIVE 12/15/2015 0252   BILIRUBINUR Negative 08/05/2014 0115   KETONESUR NEGATIVE 12/15/2015 0252   KETONESUR Negative 08/05/2014 0115   PROTEINUR NEGATIVE 12/15/2015 0252   PROTEINUR Negative 08/05/2014 0115   UROBILINOGEN 0.2 03/07/2008 1048   NITRITE NEGATIVE 12/15/2015 0252   NITRITE Negative 08/05/2014 0115   LEUKOCYTESUR NEGATIVE 12/15/2015 0252   LEUKOCYTESUR Negative 08/05/2014 0115     RADIOLOGY: Dg Chest 2 View  12/15/2015  CLINICAL DATA:  Initial evaluation for acute shortness of breath. EXAM: CHEST  2 VIEW COMPARISON:  Prior study from 12/01/2015. FINDINGS: Cardiomegaly is stable. Tortuosity of the intrathoracic aorta again noted, unchanged. Mediastinal silhouette within normal limits. Lungs normally inflated. No focal infiltrate, pulmonary edema, or pleural effusion. No pneumothorax. Mild irregular biapical pleural thickening noted. No acute osseus abnormality. Multilevel degenerative changes within the visualized spine. IMPRESSION: 1. No active cardiopulmonary disease. 2. Stable cardiomegaly with tortuosity the intrathoracic aorta. No pulmonary edema. Electronically Signed   By: Jeannine Boga M.D.   On: 12/15/2015 04:27    EKG: Orders placed or performed during the hospital encounter of 12/15/15  . ED EKG  . ED EKG  . ED  EKG  . ED EKG  . EKG 12-Lead  . EKG 12-Lead     IMPRESSION AND PLAN: 82 year old female patient with history of hypertension, hyperlipidemia, anxiety disorder presented to the emergency room with generalized weakness. Admitting diagnosis 1. Hyponatremia 2. Dehydration 3. Hypertension 4. Hyperlipidemia Treatment plan Admit patient to medical floor observation bed IV fluid hydration Follow up sodium level DVT prophylaxis with subcutaneous Lovenox Supportive care. All the records are reviewed and case discussed with ED provider. Management plans discussed with the patient, family and they are in agreement.  CODE STATUS:FULL Code Status History    This patient does not have a recorded code status. Please follow your organizational policy for patients in this situation.       TOTAL TIME TAKING CARE OF THIS PATIENT: 51 minutes.    Saundra Shelling M.D on 12/15/2015 at 6:39 AM  Between 7am to 6pm - Pager - [phone]  After 6pm go to [email or web address] - password [email or web address] [facility]  Office  [phone]  CC: Primary care physician; Lavera Guise, MD

## 2015-12-15 NOTE — ED Notes (Signed)
Per EMS pt states flu like symptoms and "pneumonia" yet lungs clear bilat to auscultation

## 2015-12-15 NOTE — Progress Notes (Addendum)
B/p 98/50 after ativan. Pt is alert and talkative.np cough noted . Dr Verdell Carmine notified of above. md orderes tessalon perrles 100mg  po bid prn .Coreg and norvasc to be held

## 2015-12-16 DIAGNOSIS — E782 Mixed hyperlipidemia: Secondary | ICD-10-CM | POA: Diagnosis not present

## 2015-12-16 DIAGNOSIS — E871 Hypo-osmolality and hyponatremia: Secondary | ICD-10-CM | POA: Diagnosis not present

## 2015-12-16 DIAGNOSIS — I10 Essential (primary) hypertension: Secondary | ICD-10-CM | POA: Diagnosis not present

## 2015-12-16 DIAGNOSIS — E860 Dehydration: Secondary | ICD-10-CM | POA: Diagnosis not present

## 2015-12-16 LAB — URINALYSIS COMPLETE WITH MICROSCOPIC (ARMC ONLY)
Bilirubin Urine: NEGATIVE
GLUCOSE, UA: NEGATIVE mg/dL
Ketones, ur: NEGATIVE mg/dL
Nitrite: NEGATIVE
PROTEIN: NEGATIVE mg/dL
Specific Gravity, Urine: 1.004 — ABNORMAL LOW (ref 1.005–1.030)
Squamous Epithelial / LPF: NONE SEEN
pH: 7 (ref 5.0–8.0)

## 2015-12-16 LAB — CBC
HCT: 36.7 % (ref 35.0–47.0)
Hemoglobin: 12.4 g/dL (ref 12.0–16.0)
MCH: 28.4 pg (ref 26.0–34.0)
MCHC: 33.7 g/dL (ref 32.0–36.0)
MCV: 84.5 fL (ref 80.0–100.0)
PLATELETS: 156 10*3/uL (ref 150–440)
RBC: 4.35 MIL/uL (ref 3.80–5.20)
RDW: 13.6 % (ref 11.5–14.5)
WBC: 5.1 10*3/uL (ref 3.6–11.0)

## 2015-12-16 LAB — BASIC METABOLIC PANEL
Anion gap: 5 (ref 5–15)
BUN: 8 mg/dL (ref 6–20)
CALCIUM: 8.1 mg/dL — AB (ref 8.9–10.3)
CO2: 24 mmol/L (ref 22–32)
CREATININE: 0.35 mg/dL — AB (ref 0.44–1.00)
Chloride: 95 mmol/L — ABNORMAL LOW (ref 101–111)
GFR calc Af Amer: >60 mL/min (ref >60)
GLUCOSE: 101 mg/dL — AB (ref 65–99)
Potassium: 3 mmol/L — ABNORMAL LOW (ref 3.5–5.1)
Sodium: 124 mmol/L — ABNORMAL LOW (ref 135–145)

## 2015-12-16 LAB — MAGNESIUM: Magnesium: 1.6 mg/dL — ABNORMAL LOW (ref 1.7–2.4)

## 2015-12-16 LAB — SODIUM: Sodium: 124 mmol/L — ABNORMAL LOW (ref 135–145)

## 2015-12-16 MED ORDER — POTASSIUM CHLORIDE CRYS ER 20 MEQ PO TBCR
40.0000 meq | EXTENDED_RELEASE_TABLET | Freq: Once | ORAL | Status: AC
  Administered 2015-12-16: 40 meq via ORAL
  Filled 2015-12-16: qty 2

## 2015-12-16 MED ORDER — MAGNESIUM SULFATE 2 GM/50ML IV SOLN
2.0000 g | Freq: Once | INTRAVENOUS | Status: AC
  Administered 2015-12-16: 2 g via INTRAVENOUS
  Filled 2015-12-16: qty 50

## 2015-12-16 MED ORDER — SALINE SPRAY 0.65 % NA SOLN
1.0000 | NASAL | Status: DC | PRN
  Filled 2015-12-16: qty 44

## 2015-12-16 NOTE — Progress Notes (Signed)
Post void residual 224 today

## 2015-12-16 NOTE — Care Management Obs Status (Signed)
[place] NOTIFICATION   Patient Details  Name: [name] MRN: [id number] Date of Birth: 1925/06/01   Medicare Observation Status Notification Given:  Yes    Kendyl Festa A, RN 12/16/2015, 8:04 AM

## 2015-12-16 NOTE — Progress Notes (Signed)
[place] at [place] NAME: Marquelle Amon    MR#:  [id number]  DATE OF BIRTH:  [birth year]  SUBJECTIVE:   Patient here due to a flulike illness not improved as an outpatient and treated with a Z-Pak recently.  Still hyponatremic but clinically feels better. No other acute complaints or events overnight.  REVIEW OF SYSTEMS:    Review of Systems  Constitutional: Positive for malaise/fatigue. Negative for fever and chills.  HENT: Negative for congestion and tinnitus.   Eyes: Negative for blurred vision and double vision.  Respiratory: Negative for cough, shortness of breath and wheezing.   Cardiovascular: Negative for chest pain, orthopnea and PND.  Gastrointestinal: Negative for nausea, vomiting, abdominal pain and diarrhea.  Genitourinary: Negative for dysuria and hematuria.  Neurological: Positive for weakness. Negative for dizziness, sensory change and focal weakness.  All other systems reviewed and are negative.   Nutrition: 2 gram sodium Tolerating Diet: yes Tolerating PT: Ambulatory   DRUG ALLERGIES:   Allergies  Allergen Reactions  . Cortisone Other (See Comments)    Headache   . Morphine And Related Other (See Comments)    headache  . Prednisone Other (See Comments)    Headache   . Sulfa Antibiotics Nausea Only    VITALS:  Blood pressure 138/72, pulse 82, temperature 98.2 F (36.8 C), temperature source Oral, resp. rate 18, height 5' (1.524 m), weight 58.968 kg (130 lb), SpO2 96 %.  PHYSICAL EXAMINATION:   Physical Exam  GENERAL:  82 y.o.-year-old patient lying in the bed in no acute distress.  EYES: Pupils equal, round, reactive to light and accommodation. No scleral icterus. Extraocular muscles intact.  HEENT: Head atraumatic, normocephalic. Oropharynx and nasopharynx clear.  NECK:  Supple, no jugular venous distention. No thyroid enlargement, no tenderness.  LUNGS: Normal breath sounds bilaterally, no  wheezing, rales, rhonchi. No use of accessory muscles of respiration.  CARDIOVASCULAR: S1, S2 normal. No murmurs, rubs, or gallops.  ABDOMEN: Soft, nontender, nondistended. Bowel sounds present. No organomegaly or mass.  EXTREMITIES: No cyanosis, clubbing or edema b/l.    NEUROLOGIC: Cranial nerves II through XII are intact. No focal Motor or sensory deficits b/l.   PSYCHIATRIC: The patient is alert and oriented x 3.  SKIN: No obvious rash, lesion, or ulcer.    LABORATORY PANEL:   CBC  Recent Labs Lab 12/16/15 0528  WBC 5.1  HGB 12.4  HCT 36.7  PLT 156   ------------------------------------------------------------------------------------------------------------------  Chemistries   Recent Labs Lab 12/15/15 0252 12/16/15 0528  NA 123* 124*  K 3.6 3.0*  CL 91* 95*  CO2 25 24  GLUCOSE 103* 101*  BUN 12 8  CREATININE 0.54 0.35*  CALCIUM 9.0 8.1*  MG  --  1.6*  AST 27  --   ALT 17  --   ALKPHOS 63  --   BILITOT 0.8  --    ------------------------------------------------------------------------------------------------------------------  Cardiac Enzymes  Recent Labs Lab 12/15/15 0252  TROPONINI <0.03   ------------------------------------------------------------------------------------------------------------------  RADIOLOGY:  Dg Chest 2 View  12/15/2015  CLINICAL DATA:  Initial evaluation for acute shortness of breath. EXAM: CHEST  2 VIEW COMPARISON:  Prior study from 12/01/2015. FINDINGS: Cardiomegaly is stable. Tortuosity of the intrathoracic aorta again noted, unchanged. Mediastinal silhouette within normal limits. Lungs normally inflated. No focal infiltrate, pulmonary edema, or pleural effusion. No pneumothorax. Mild irregular biapical pleural thickening noted. No acute osseus abnormality. Multilevel degenerative changes within the visualized spine. IMPRESSION: 1. No active cardiopulmonary  disease. 2. Stable cardiomegaly with tortuosity the intrathoracic aorta.  No pulmonary edema. Electronically Signed   By: Jeannine Boga M.D.   On: 12/15/2015 04:27     ASSESSMENT AND PLAN:   82 year old female with past medical history of anxiety, hypertension, hyperlipidemia, chronic back pain, history of urinary retention who presents to the hospital due to generalized weakness and malaise after a flulike illness and noted to be hyponatremic.  #1 hyponatremia - etiology unclear. Euvolemic in nature.  - sodium is still low but pt is asymptomatic.  Likely SIADH given urine & serum Osm.  - will d/c IV fluids and monitor.   #2 Essential HTN - cont. Coreg, Norvasc.  - BP stable.   #3 Anxiety - cont. Ativan PRN.   #4 hx of Urinary Retention - cont. Flomax.  - cont. In and out cath.    #5. Chronic Back Pain - cont. Tramadol, Zanaflex.    #6 Hypokalemia - will place on oral supplements and follow.  - Mg was low and will replace.   Will recheck sodium this afternoon and if improving then possible d/c home later today otherwise will wait till tomorrow once sodium is better.    All the records are reviewed and case discussed with Care Management/Social Workerr. Management plans discussed with the patient, family and they are in agreement.  CODE STATUS: Full   DVT Prophylaxis: Lovenox  TOTAL TIME TAKING CARE OF THIS PATIENT: 25 minutes.   POSSIBLE D/C IN 1-2 DAYS, DEPENDING ON CLINICAL CONDITION.   Henreitta Leber M.D on 12/16/2015 at 1:37 PM  Between 7am to 6pm - Pager - [phone]  After 6pm go to [email or web address] - password [email or web address] [facility]  Office  [phone]  CC: Primary care physician; Lavera Guise, MD

## 2015-12-16 NOTE — Progress Notes (Signed)
Pt with burning with urination and pelvic pain. Bladder scan 24cc. rn spoke with dr Anselm Jungling notified. U/a ordered

## 2015-12-16 NOTE — Progress Notes (Addendum)
Serum odium remains  124 . rn spoke with dr Verdell Carmine who ordered 1041ml fluid restriction. Pt i/s on fluid restriction. Understanding voiced

## 2015-12-16 NOTE — Progress Notes (Addendum)
Pt with nasal congestion.spoke with dr Verdell Carmine macular degeneration ordered 1 sprays   prn

## 2015-12-16 NOTE — Progress Notes (Signed)
Pt experiencing urinary frequency. Bladder scan reveals 724 post void. rn spoke with dr Verdell Carmine who ordered in and out cathx1 and will start flomax. Pt with hx of urinary retention

## 2015-12-17 DIAGNOSIS — E222 Syndrome of inappropriate secretion of antidiuretic hormone: Secondary | ICD-10-CM | POA: Diagnosis present

## 2015-12-17 DIAGNOSIS — E871 Hypo-osmolality and hyponatremia: Secondary | ICD-10-CM | POA: Diagnosis not present

## 2015-12-17 DIAGNOSIS — E860 Dehydration: Secondary | ICD-10-CM | POA: Diagnosis present

## 2015-12-17 DIAGNOSIS — R339 Retention of urine, unspecified: Secondary | ICD-10-CM | POA: Diagnosis present

## 2015-12-17 DIAGNOSIS — M545 Low back pain: Secondary | ICD-10-CM | POA: Diagnosis present

## 2015-12-17 DIAGNOSIS — F419 Anxiety disorder, unspecified: Secondary | ICD-10-CM | POA: Diagnosis present

## 2015-12-17 DIAGNOSIS — Z79891 Long term (current) use of opiate analgesic: Secondary | ICD-10-CM | POA: Diagnosis not present

## 2015-12-17 DIAGNOSIS — E785 Hyperlipidemia, unspecified: Secondary | ICD-10-CM | POA: Diagnosis present

## 2015-12-17 DIAGNOSIS — E7800 Pure hypercholesterolemia, unspecified: Secondary | ICD-10-CM | POA: Diagnosis present

## 2015-12-17 DIAGNOSIS — E782 Mixed hyperlipidemia: Secondary | ICD-10-CM | POA: Diagnosis not present

## 2015-12-17 DIAGNOSIS — Z882 Allergy status to sulfonamides status: Secondary | ICD-10-CM | POA: Diagnosis not present

## 2015-12-17 DIAGNOSIS — Z885 Allergy status to narcotic agent status: Secondary | ICD-10-CM | POA: Diagnosis not present

## 2015-12-17 DIAGNOSIS — G8929 Other chronic pain: Secondary | ICD-10-CM | POA: Diagnosis present

## 2015-12-17 DIAGNOSIS — Z888 Allergy status to other drugs, medicaments and biological substances status: Secondary | ICD-10-CM | POA: Diagnosis not present

## 2015-12-17 DIAGNOSIS — I10 Essential (primary) hypertension: Secondary | ICD-10-CM | POA: Diagnosis present

## 2015-12-17 DIAGNOSIS — E876 Hypokalemia: Secondary | ICD-10-CM | POA: Diagnosis not present

## 2015-12-17 DIAGNOSIS — N390 Urinary tract infection, site not specified: Secondary | ICD-10-CM | POA: Diagnosis present

## 2015-12-17 LAB — BASIC METABOLIC PANEL
Anion gap: 7 (ref 5–15)
BUN: 8 mg/dL (ref 6–20)
CALCIUM: 8.6 mg/dL — AB (ref 8.9–10.3)
CO2: 25 mmol/L (ref 22–32)
CREATININE: 0.42 mg/dL — AB (ref 0.44–1.00)
Chloride: 94 mmol/L — ABNORMAL LOW (ref 101–111)
GFR calc Af Amer: >60 mL/min (ref >60)
GLUCOSE: 142 mg/dL — AB (ref 65–99)
Potassium: 3.4 mmol/L — ABNORMAL LOW (ref 3.5–5.1)
Sodium: 126 mmol/L — ABNORMAL LOW (ref 135–145)

## 2015-12-17 LAB — SODIUM
SODIUM: 126 mmol/L — AB (ref 135–145)
Sodium: 125 mmol/L — ABNORMAL LOW (ref 135–145)

## 2015-12-17 LAB — TSH: TSH: 1.565 u[IU]/mL (ref 0.350–4.500)

## 2015-12-17 MED ORDER — CEFUROXIME AXETIL 500 MG PO TABS
500.0000 mg | ORAL_TABLET | Freq: Two times a day (BID) | ORAL | Status: DC
  Administered 2015-12-17 – 2015-12-19 (×5): 500 mg via ORAL
  Filled 2015-12-17 (×5): qty 1

## 2015-12-17 MED ORDER — POTASSIUM CHLORIDE 20 MEQ PO PACK
20.0000 meq | PACK | Freq: Once | ORAL | Status: AC
  Administered 2015-12-17: 20 meq via ORAL
  Filled 2015-12-17: qty 1

## 2015-12-17 MED ORDER — SODIUM CHLORIDE 1 G PO TABS
1.0000 g | ORAL_TABLET | Freq: Two times a day (BID) | ORAL | Status: DC
  Administered 2015-12-17 – 2015-12-19 (×4): 1 g via ORAL
  Filled 2015-12-17 (×4): qty 1

## 2015-12-17 MED ORDER — POLYETHYLENE GLYCOL 3350 17 G PO PACK: 17.0000 g | PACK | Freq: Every day | ORAL | Status: DC | PRN

## 2015-12-17 NOTE — Progress Notes (Signed)
Pts BP 102/67, MD Mody notified,  Norvasc held, received order for NA lab draw @ 1500. Order placed.

## 2015-12-17 NOTE — Progress Notes (Signed)
[place] at [place] NAME: [name]    MR#:  [id number]  DATE OF BIRTH:  Sep 27, 1925  SUBJECTIVE:   Doing well. No acute issues. Denies weakness or nausea.[name]  REVIEW OF SYSTEMS:    Review of Systems  Constitutional: Negative for fever, chills and malaise/fatigue.  HENT: Negative for congestion and tinnitus.   Eyes: Negative for blurred vision and double vision.  Respiratory: Negative for cough, shortness of breath and wheezing.   Cardiovascular: Negative for chest pain, orthopnea and PND.  Gastrointestinal: Negative for nausea, vomiting, abdominal pain and diarrhea.  Genitourinary: Positive for dysuria and frequency. Negative for urgency and hematuria.  Musculoskeletal: Negative for myalgias.  Neurological: Negative for dizziness, sensory change, focal weakness and weakness.  Psychiatric/Behavioral: Positive for depression. Negative for suicidal ideas and hallucinations. The patient is not nervous/anxious.   All other systems reviewed and are negative.  Tolerating Diet: yes    DRUG ALLERGIES:   Allergies  Allergen Reactions  . Cortisone Other (See Comments)    Headache   . Morphine And Related Other (See Comments)    headache  . Prednisone Other (See Comments)    Headache   . Sulfa Antibiotics Nausea Only    VITALS:  Blood pressure 111/76, pulse 89, temperature 98 F (36.7 C), temperature source Oral, resp. rate 18, height 5' (1.524 m), weight 58.968 kg (130 lb), SpO2 95 %.  PHYSICAL EXAMINATION:   Physical Exam  GENERAL:  82 y.o.-year-old patient lying in the bed in no acute distress.  EYES: Pupils equal, round, reactive to light and accommodation. No scleral icterus. Extraocular muscles intact.  HEENT: Head atraumatic, normocephalic. Oropharynx and nasopharynx clear.  NECK:  Supple, no jugular venous distention. No thyroid enlargement, no tenderness.  LUNGS: Normal breath sounds bilaterally, no wheezing,  rales, rhonchi. No use of accessory muscles of respiration.  CARDIOVASCULAR: S1, S2 normal. No murmurs, rubs, or gallops.  ABDOMEN: Soft, nontender, nondistended. Bowel sounds present. No organomegaly or mass.  EXTREMITIES: No cyanosis, clubbing or edema b/l.    NEUROLOGIC: Cranial nerves II through XII are intact. No focal Motor or sensory deficits b/l.   PSYCHIATRIC: The patient is alert and oriented x 3.  SKIN: No obvious rash, lesion, or ulcer.    LABORATORY PANEL:   CBC  Recent Labs Lab 12/16/15 0528  WBC 5.1  HGB 12.4  HCT 36.7  PLT 156   ------------------------------------------------------------------------------------------------------------------  Chemistries   Recent Labs Lab 12/15/15 0252 12/16/15 0528  12/17/15 0624 12/17/15 0920  NA 123* 124*  < > 126* 125*  K 3.6 3.0*  --  3.4*  --   CL 91* 95*  --  94*  --   CO2 25 24  --  25  --   GLUCOSE 103* 101*  --  142*  --   BUN 12 8  --  8  --   CREATININE 0.54 0.35*  --  0.42*  --   CALCIUM 9.0 8.1*  --  8.6*  --   MG  --  1.6*  --   --   --   AST 27  --   --   --   --   ALT 17  --   --   --   --   ALKPHOS 63  --   --   --   --   BILITOT 0.8  --   --   --   --   < > =  values in this interval not displayed. ------------------------------------------------------------------------------------------------------------------  Cardiac Enzymes  Recent Labs Lab 12/15/15 0252  TROPONINI <0.03   ------------------------------------------------------------------------------------------------------------------  RADIOLOGY:  No results found.   ASSESSMENT AND PLAN:   82 year old female with past medical history of anxiety, hypertension, hyperlipidemia, chronic back pain, history of urinary retention who presents to the hospital due to generalized weakness and malaise after a flulike illness and noted to be hyponatremic.  1 hyponatremia : Patient is asymptomatic  Likely SIADH given urine & serum Osm.  Sodium  level down to 125. Consult nephrology. Follow up on TSH.    2 Essential HTN: Continue Coreg and Norvasc.   BP stable.   3 Anxiety: Continue Ativan PRN.   4 history  of Urinary Retention:  continue Flomax.  - cont. In and out cath.    5. Chronic Back Pain: Continue. Tramadol  and Zanaflex.     6 hypo-magnesium and hypokalemia: This was repleted.   7. Urinary tract infection: Continue Ceftin.   All the records are reviewed and case discussed with Care Management.  Management plans discussed with the patient and she is in agreement.  CODE STATUS: Full   DVT Prophylaxis: Lovenox  TOTAL TIME TAKING CARE OF THIS PATIENT: 25 minutes.   POSSIBLE D/C IN 1-2 DAYS, DEPENDING ON CLINICAL CONDITION.   Tallia Moehring M.D on 12/17/2015 at 11:05 AM  Between 7am to 6pm - Pager - [phone]  After 6pm go to [email or web address] - password [email or web address] [facility]  Office  [phone]  CC: Primary care physician; Lavera Guise, MD

## 2015-12-17 NOTE — Evaluation (Signed)
Physical Therapy Evaluation Patient Details Name: [name] MRN: [id number] DOB: January 11, 1925 Today's Date: 12/17/2015   History of Present Illness  82 yo F presented to ED with flulike symptoms for the past week. She was found to have hypokalemia and generalized weakness.   Clinical Impression  Pt demonstrates generalized weakness and difficulty walking with decreased functional independence. She performed bed mobility with supervision. Transfers and ambulation of up to 200 ft with SPC and min guard. She fatigues quickly and becomes unsteady when she does so and requires therapeutic rest breaks for energy conservation. Pt navigated 4 steps with 1 rail and SPC with min guard with general steadiness until one posterior LOB coming down. She will benefit from HHPT after hospital discharge to improve functional independence and assess home safety. Pt will benefit from skilled PT services to increase functional I and mobility for returning to PLOF.     Follow Up Recommendations Home health PT    Equipment Recommendations  None recommended by PT    Recommendations for Other Services       Precautions / Restrictions Precautions Precautions: Fall Restrictions Weight Bearing Restrictions: No      Mobility  Bed Mobility Overal bed mobility: Needs Assistance Bed Mobility: Supine to Sit;Sit to Supine     Supine to sit: Supervision;HOB elevated Sit to supine: Supervision;HOB elevated   General bed mobility comments: uses rail  Transfers Overall transfer level: Needs assistance Equipment used: Straight cane Transfers: Sit to/from [employer] Transfers Sit to Stand: Min guard Stand pivot transfers: Min guard       General transfer comment: cues for hand placement and safety  Ambulation/Gait Ambulation/Gait assistance: Min guard Ambulation Distance (Feet): 150 Feet Assistive device: Straight cane Gait Pattern/deviations: Decreased stride length;Drifts right/left;Narrow  base of support     General Gait Details: slightly unsteady as she fatigues  Stairs Stairs: Yes Stairs assistance: Min guard Stair Management: One rail Right;With cane Number of Stairs: 4 General stair comments: Pt steady until one posterior LOB coming down the stairs.  Wheelchair Mobility    Modified Rankin (Stroke Patients Only)       Balance Overall balance assessment: Needs assistance Sitting-balance support: No upper extremity supported Sitting balance-Leahy Scale: Good Sitting balance - Comments: maintains independently   Standing balance support: Single extremity supported Standing balance-Leahy Scale: Fair Standing balance comment: dynamic standing balance becomes slightly unsteady as pt fatigues                             Pertinent Vitals/Pain Pain Assessment: No/denies pain    Home Living Family/patient expects to be discharged to:: Private residence Living Arrangements: Children Available Help at Discharge: Family Type of Home: House Home Access: Stairs to enter Entrance Stairs-Rails: Right Entrance Stairs-Number of Steps: 3 Home Layout: One level Home Equipment: Environmental consultant - 2 wheels;Cane - single point Additional Comments: Pt lives with her daughter.    Prior Function Level of Independence: Independent with assistive device(s)         Comments: Pt ambulates household distances with [facility] and is I with basic ADLs.     Hand Dominance        Extremity/Trunk Assessment   Upper Extremity Assessment: Generalized weakness           Lower Extremity Assessment: Generalized weakness         Communication   Communication: HOH  Cognition Arousal/Alertness: Awake/alert Behavior During Therapy: WFL for tasks assessed/performed Overall Cognitive  Status: Within Functional Limits for tasks assessed                      General Comments      Exercises Other Exercises Other Exercises: Pt ambulated 150 ftx1 and 200 ftx2 with  SPC and min guard with seated therapeutic rest breaks between for energy conservation.       Assessment/Plan    PT Assessment Patient needs continued PT services  PT Diagnosis Difficulty walking;Generalized weakness   PT Problem List Decreased strength;Decreased activity tolerance;Decreased balance;Decreased mobility  PT Treatment Interventions Gait training;Stair training;Therapeutic activities;Therapeutic exercise;Balance training;Neuromuscular re-education;Patient/family education   PT Goals (Current goals can be found in the Care Plan section) Acute Rehab PT Goals Patient Stated Goal: To go home PT Goal Formulation: With patient Time For Goal Achievement: 12/31/15 Potential to Achieve Goals: Good    Frequency Min 2X/week   Barriers to discharge Inaccessible home environment steps to enter    Co-evaluation               End of Session Equipment Utilized During Treatment: Gait belt Activity Tolerance: Patient limited by fatigue Patient left: in bed;with call bell/phone within reach;with bed alarm set Nurse Communication: Mobility status         Time: 0320-0344 PT Time Calculation (min) (ACUTE ONLY): 24 min   Charges:   PT Evaluation $PT Eval Low Complexity: 1 Procedure PT Treatments $Gait Training: 8-22 mins   PT G Codes:        Neoma Laming, PT, DPT  12/17/2015, 4:19 PM [phone]

## 2015-12-18 LAB — COMPREHENSIVE METABOLIC PANEL
ALK PHOS: 64 U/L (ref 38–126)
ALT: 24 U/L (ref 14–54)
ANION GAP: 9 (ref 5–15)
AST: 45 U/L — AB (ref 15–41)
Albumin: 3.9 g/dL (ref 3.5–5.0)
BILIRUBIN TOTAL: 0.5 mg/dL (ref 0.3–1.2)
BUN: 12 mg/dL (ref 6–20)
CALCIUM: 8.9 mg/dL (ref 8.9–10.3)
CO2: 22 mmol/L (ref 22–32)
Chloride: 95 mmol/L — ABNORMAL LOW (ref 101–111)
Creatinine, Ser: 0.56 mg/dL (ref 0.44–1.00)
GFR calc Af Amer: >60 mL/min (ref >60)
GFR calc non Af Amer: >60 mL/min (ref >60)
GLUCOSE: 107 mg/dL — AB (ref 65–99)
Potassium: 3.8 mmol/L (ref 3.5–5.1)
SODIUM: 126 mmol/L — AB (ref 135–145)
TOTAL PROTEIN: 6.9 g/dL (ref 6.5–8.1)

## 2015-12-18 LAB — BASIC METABOLIC PANEL
ANION GAP: 6 (ref 5–15)
BUN: 13 mg/dL (ref 6–20)
CALCIUM: 8.8 mg/dL — AB (ref 8.9–10.3)
CO2: 23 mmol/L (ref 22–32)
Chloride: 97 mmol/L — ABNORMAL LOW (ref 101–111)
Creatinine, Ser: 0.56 mg/dL (ref 0.44–1.00)
GFR calc Af Amer: >60 mL/min (ref >60)
Glucose, Bld: 99 mg/dL (ref 65–99)
POTASSIUM: 3.8 mmol/L (ref 3.5–5.1)
SODIUM: 126 mmol/L — AB (ref 135–145)

## 2015-12-18 LAB — SODIUM: Sodium: 130 mmol/L — ABNORMAL LOW (ref 135–145)

## 2015-12-18 LAB — OSMOLALITY: Osmolality: 265 mOsm/kg — ABNORMAL LOW (ref 275–295)

## 2015-12-18 LAB — MAGNESIUM: MAGNESIUM: 2 mg/dL (ref 1.7–2.4)

## 2015-12-18 LAB — OSMOLALITY, URINE: OSMOLALITY UR: 317 mosm/kg (ref 300–900)

## 2015-12-18 LAB — SODIUM, URINE, RANDOM: Sodium, Ur: 42 mmol/L

## 2015-12-18 MED ORDER — ALUM & MAG HYDROXIDE-SIMETH 200-200-20 MG/5ML PO SUSP
30.0000 mL | Freq: Four times a day (QID) | ORAL | Status: DC | PRN
Start: 2015-12-18 — End: 2015-12-19
  Administered 2015-12-18: 30 mL via ORAL
  Filled 2015-12-18: qty 30

## 2015-12-18 NOTE — Care Management (Signed)
Patient from home. Daughter lives with her. Refusing Home health PT currently- wants to go home. Wants her hearing aide.

## 2015-12-18 NOTE — Progress Notes (Signed)
Physical Therapy Treatment Patient Details Name: [name] MRN: [id number] DOB: Nov 03, 1924 Today's Date: 12/18/2015    History of Present Illness 82 yo F presented to ED with flulike symptoms for the past week. She was found to have hypokalemia and generalized weakness.     PT Comments    Pt is making good progress towards goals with good endurance with there-ex. Pt refuses further mobility this date as she is anxious to go home and complains of back pain from ambulation on previous date. Pt reports she has sat in recliner all day. Agreeable to limited there-ex and toilet transfer. Safe technique with mobility. Continue efforts as able. Pt keeps reporting she wants to go home.  Follow Up Recommendations  Home health PT     Equipment Recommendations  None recommended by PT    Recommendations for Other Services       Precautions / Restrictions Precautions Precautions: Fall Restrictions Weight Bearing Restrictions: No    Mobility  Bed Mobility Overal bed mobility: Needs Assistance Bed Mobility: Supine to Sit;Sit to Supine     Supine to sit: Supervision;HOB elevated Sit to supine: Supervision;HOB elevated   General bed mobility comments: uses rail  Transfers Overall transfer level: Needs assistance Equipment used: None Transfers: Sit to/from Stand Sit to Stand: Supervision         General transfer comment: safe technique pushing from seated surface. Once standing, no LOB noted  Ambulation/Gait Ambulation/Gait assistance: Min guard Ambulation Distance (Feet): 3 Feet Assistive device: None Gait Pattern/deviations: Step-to pattern     General Gait Details: ambulated to [facility], refused further ambulation. Safe technique performed. Pt reaching out for handles on [facility]   Stairs            Wheelchair Mobility    Modified Rankin (Stroke Patients Only)       Balance                                    Cognition Arousal/Alertness:  Awake/alert Behavior During Therapy: Anxious Overall Cognitive Status: Within Functional Limits for tasks assessed                      Exercises Other Exercises Other Exercises: supine ther-ex performed including B hip add squeezes, LAQ, SLRs, ankle pumps and B UE shoulder flexion. All ther-ex performed x 12 reps with cues and supervision. Other Exercises: Pt ambulated to [facility]; needs cga for set up and hands on assist for cleaning. Safe technique performed with no LOB noted.    General Comments        Pertinent Vitals/Pain Pain Assessment: Faces Faces Pain Scale: Hurts little more Pain Location: LBP Pain Descriptors / Indicators: Constant Pain Intervention(s): Limited activity within patient's tolerance    Home Living                      Prior Function            PT Goals (current goals can now be found in the care plan section) Acute Rehab PT Goals Patient Stated Goal: To go home PT Goal Formulation: With patient Time For Goal Achievement: 12/31/15 Potential to Achieve Goals: Good Progress towards PT goals: Progressing toward goals    Frequency  Min 2X/week    PT Plan Current plan remains appropriate    Co-evaluation  End of Session   Activity Tolerance: Patient limited by pain Patient left: in bed;with call bell/phone within reach;with bed alarm set     Time: [id number] PT Time Calculation (min) (ACUTE ONLY): 26 min  Charges:  $Therapeutic Exercise: 8-22 mins $Therapeutic Activity: 8-22 mins                    G Codes:      Benett Swoyer 01-11-2016, 5:21 PM  Greggory Stallion, PT, DPT [phone]

## 2015-12-18 NOTE — Progress Notes (Signed)
[place] [facility] [place] NAME: [name]    MR#:  [id number]  DATE OF BIRTH:  [birth year]  SUBJECTIVE:  I want to go home  REVIEW OF SYSTEMS:    Review of Systems  Constitutional: Negative for fever, chills and malaise/fatigue.  HENT: Negative for congestion and tinnitus.   Eyes: Negative for blurred vision and double vision.  Respiratory: Negative for cough, shortness of breath and wheezing.   Cardiovascular: Negative for chest pain, orthopnea and PND.  Gastrointestinal: Negative for nausea, vomiting, abdominal pain and diarrhea.  Genitourinary: Negative for dysuria, urgency, frequency and hematuria.  Musculoskeletal: Negative for myalgias.  Neurological: Negative for dizziness, sensory change, focal weakness and weakness.  Psychiatric/Behavioral: Negative for depression, suicidal ideas and hallucinations. The patient is not nervous/anxious.   All other systems reviewed and are negative.  Tolerating Diet: yes    DRUG ALLERGIES:   Allergies  Allergen Reactions  . Cortisone Other (See Comments)    Headache   . Morphine And Related Other (See Comments)    headache  . Prednisone Other (See Comments)    Headache   . Sulfa Antibiotics Nausea Only    VITALS:  Blood pressure 148/92, pulse 101, temperature 98.3 F (36.8 C), temperature source Oral, resp. rate 18, height 5' (1.524 m), weight 58.968 kg (130 lb), SpO2 96 %.  PHYSICAL EXAMINATION:   Physical Exam  Constitutional: She is oriented to person, place, and time and well-developed, well-nourished, and in no distress. No distress.  HENT:  Head: Normocephalic.  Eyes: No scleral icterus.  Neck: Normal range of motion. Neck supple. No JVD present. No tracheal deviation present.  Cardiovascular: Normal rate, regular rhythm and normal heart sounds.  Exam reveals no gallop and no friction rub.   No murmur heard. Pulmonary/Chest: Effort normal and breath sounds normal.  No respiratory distress. She has no wheezes. She has no rales. She exhibits no tenderness.  Abdominal: Soft. Bowel sounds are normal. She exhibits no distension and no mass. There is no tenderness. There is no rebound and no guarding.  Musculoskeletal: Normal range of motion. She exhibits no edema.  Neurological: She is alert and oriented to person, place, and time.  Skin: Skin is warm. No rash noted. No erythema.  Psychiatric: Affect and judgment normal.    GENERAL:  82 y.o.-year-old patient lying in the bed in no acute distress.  EYES: Pupils equal, round, reactive to light and accommodation. No scleral icterus. Extraocular muscles intact.  HEENT: Head atraumatic, normocephalic. Oropharynx and nasopharynx clear.  NECK:  Supple, no jugular venous distention. No thyroid enlargement, no tenderness.  LUNGS: Normal breath sounds bilaterally, no wheezing, rales, rhonchi. No use of accessory muscles of respiration.  CARDIOVASCULAR: S1, S2 normal. No murmurs, rubs, or gallops.  ABDOMEN: Soft, nontender, nondistended. Bowel sounds present. No organomegaly or mass.  EXTREMITIES: No cyanosis, clubbing or edema b/l.    NEUROLOGIC: Cranial nerves II through XII are intact. No focal Motor or sensory deficits b/l.   PSYCHIATRIC: The patient is alert and oriented x 3.  SKIN: No obvious rash, lesion, or ulcer.    LABORATORY PANEL:   CBC  Recent Labs Lab 12/16/15 0528  WBC 5.1  HGB 12.4  HCT 36.7  PLT 156   ------------------------------------------------------------------------------------------------------------------  Chemistries   Recent Labs Lab 12/18/15 0646 12/18/15 0819  NA 126* 126*  K 3.8 3.8  CL 97* 95*  CO2 23 22  GLUCOSE 99 107*  BUN 13 12  CREATININE 0.56 0.56  CALCIUM 8.8* 8.9  MG 2.0  --   AST  --  45*  ALT  --  24  ALKPHOS  --  64  BILITOT  --  0.5    ------------------------------------------------------------------------------------------------------------------  Cardiac Enzymes  Recent Labs Lab 12/15/15 0252  TROPONINI <0.03   ------------------------------------------------------------------------------------------------------------------  RADIOLOGY:  No results found.   ASSESSMENT AND PLAN:   82 year old female with past medical history of anxiety, hypertension, hyperlipidemia, chronic back pain, history of urinary retention who presents to the hospital due to generalized weakness and malaise after a flulike illness and noted to be hyponatremic.  1 hyponatremia : Patient is asymptomatic  Urine and serum studies are consistent with SIADH  Continue fluid restriction and sodium chloride tablets.    2 Essential HTN: Continue Coreg and Norvasc.   BP stable.   3 Anxiety: Continue Ativan PRN.   4 history  of Urinary Retention:  continue Flomax.  Continue In and out cath.    5. Chronic Back Pain: Continue. Tramadol  and Zanaflex.     6 hypo-magnesium and hypokalemia: This was repleted.   7. Urinary tract infection: Continue Ceftin D2/5.     Management plans discussed with the patient and she is in agreement.  CODE STATUS: Full   DVT Prophylaxis: Lovenox  TOTAL TIME TAKING CARE OF THIS PATIENT: 25 minutes.   POSSIBLE D/C IN 1-2 DAYS, DEPENDING ON CLINICAL CONDITION.   [name] M.D on 12/18/2015 at 11:04 AM  Between 7am to 6pm - Pager - [phone]  After 6pm go to [email or web address] - password [email or web address] [facility]  Office  [phone]  CC: Primary care physician; Lavera Guise, MD

## 2015-12-18 NOTE — Consult Note (Signed)
[facility]  CONSULT NOTE    Date: 12/18/2015                  Patient Name:  [name]  MRN: [id number]  DOB: Feb 14, 1925  Age / Sex: 82 y.o., female         PCP: Lavera Guise, MD                 Service Requesting Consult: Dr. Benjie Karvonen                 Reason for Consult: Hyponatremia            History of Present Illness: [name] is a 82 y.o. white female with hypertension, hyperlipidemia, anxiety, chronic lower back pain and urinary retention, who was admitted to [facility] on 12/15/2015 for Hyponatremia [E87.1] Weakness [R53.1]  Patient is not on a diuretic at home. Patient with sodium level of 132 in 12/01/15. However on admission, sodium of 123. Given NS at 75 from 3/6-3/7 with minimal improvement in sodium level. Now placed on sodium chloride without much improvement. Nephrology called.   Patient states that she has been told to limit her water intake in the past.   Hard of hearing and difficult to get much history Medications: Outpatient medications: Prescriptions prior to admission  Medication Sig Dispense Refill Last Dose  . amLODipine (NORVASC) 5 MG tablet Take 5 mg by mouth daily.   12/14/2015 at Unknown time  . carvedilol (COREG) 6.25 MG tablet Take 6.25 mg by mouth 2 (two) times daily with a meal.   12/14/2015 at Unknown time  . HYDROcodone-acetaminophen (NORCO) 10-325 MG tablet Take 1 tablet by mouth 4 (four) times daily as needed.    12/14/2015 at Unknown time  . LORazepam (ATIVAN) 0.5 MG tablet Take 0.5 mg by mouth 3 (three) times daily as needed.    12/14/2015 at Unknown time  . mirtazapine (REMERON) 30 MG tablet Take 30 mg by mouth at bedtime.   12/14/2015 at Unknown time  . tiZANidine (ZANAFLEX) 2 MG tablet Take 1-2 mg by mouth 2 (two) times daily as needed for muscle spasms.     . traMADol-acetaminophen (ULTRACET) 37.5-325 MG tablet Take 1 tablet by mouth every 8 (eight) hours as needed for moderate pain. 30 tablet 0     Current  medications: Current Facility-Administered Medications  Medication Dose Route Frequency Provider Last Rate Last Dose  . acetaminophen (TYLENOL) tablet 650 mg  650 mg Oral Q6H PRN Saundra Shelling, MD   650 mg at 12/15/15 [id number]   Or  . acetaminophen (TYLENOL) suppository 650 mg  650 mg Rectal Q6H PRN Pavan Pyreddy, MD      . amLODipine (NORVASC) tablet 5 mg  5 mg Oral Daily Henreitta Leber, MD   5 mg at 12/18/15 0751  . benzonatate (TESSALON) capsule 100 mg  100 mg Oral BID PRN Henreitta Leber, MD   100 mg at 12/16/15 1740  . carvedilol (COREG) tablet 6.25 mg  6.25 mg Oral BID WC Henreitta Leber, MD   6.25 mg at 12/18/15 0751  . cefUROXime (CEFTIN) tablet 500 mg  500 mg Oral BID WC Bettey Costa, MD   500 mg at 12/18/15 0751  . docusate sodium (COLACE) capsule 100 mg  100 mg Oral BID Saundra Shelling, MD   100 mg at 12/18/15 0751  . enoxaparin (LOVENOX) injection 40 mg  40 mg Subcutaneous Q24H Saundra Shelling, MD   40  mg at 12/17/15 2032  . HYDROcodone-acetaminophen (NORCO) 10-325 MG per tablet 1 tablet  1 tablet Oral QID PRN Bettey Costa, MD   1 tablet at 12/17/15 0851  . LORazepam (ATIVAN) tablet 0.5 mg  0.5 mg Oral TID PRN Henreitta Leber, MD   0.5 mg at 12/17/15 1009  . mirtazapine (REMERON) tablet 30 mg  30 mg Oral QHS Henreitta Leber, MD   30 mg at 12/17/15 2032  . ondansetron (ZOFRAN) tablet 4 mg  4 mg Oral Q6H PRN Saundra Shelling, MD       Or  . ondansetron (ZOFRAN) injection 4 mg  4 mg Intravenous Q6H PRN Pavan Pyreddy, MD      . polyethylene glycol (MIRALAX / GLYCOLAX) packet 17 g  17 g Oral Daily PRN Sital Mody, MD      . sodium chloride (OCEAN) 0.65 % nasal spray 1 spray  1 spray Each Nare PRN Henreitta Leber, MD      . sodium chloride tablet 1 g  1 g Oral BID WC Lavonia Dana, MD   1 g at 12/18/15 0751  . tamsulosin (FLOMAX) capsule 0.4 mg  0.4 mg Oral Daily Henreitta Leber, MD   0.4 mg at 12/18/15 0750  . tiZANidine (ZANAFLEX) tablet 1-2 mg  1-2 mg Oral BID PRN Henreitta Leber, MD      .  traMADol-acetaminophen (ULTRACET) 37.5-325 MG per tablet 1 tablet  1 tablet Oral Q8H PRN Bettey Costa, MD   1 tablet at 12/18/15 0448      Allergies: Allergies  Allergen Reactions  . Cortisone Other (See Comments)    Headache   . Morphine And Related Other (See Comments)    headache  . Prednisone Other (See Comments)    Headache   . Sulfa Antibiotics Nausea Only      Past Medical History: Past Medical History  Diagnosis Date  . High cholesterol   . Hypertension   . Anxiety   . Chronic back pain   . Pneumonia   . History of urinary retention      Past Surgical History: Past Surgical History  Procedure Laterality Date  . Back surgery    . Breast biopsy Right 2002    neg  . Abdominal hysterectomy       Family History: Family History  Problem Relation Age of Onset  . Breast cancer Sister 4  . Breast cancer Maternal Grandmother 65  . Breast cancer Sister 40     Social History: Social History   Social History  . Marital Status: Widowed    Spouse Name: N/A  . Number of Children: N/A  . Years of Education: N/A   Occupational History  . retired    Social History Main Topics  . Smoking status: Never Smoker   . Smokeless tobacco: Not on file  . Alcohol Use: No  . Drug Use: No  . Sexual Activity: Not on file   Other Topics Concern  . Not on file   Social History Narrative     Review of Systems: Review of Systems  Unable to perform ROS Constitutional: Negative.  Negative for fever, chills, weight loss, malaise/fatigue and diaphoresis.  HENT: Positive for hearing loss. Negative for congestion, ear discharge, ear pain, nosebleeds, sore throat and tinnitus.   Eyes: Negative.  Negative for blurred vision, double vision, photophobia, pain, discharge and redness.  Respiratory: Positive for cough and shortness of breath. Negative for hemoptysis, sputum production, wheezing and stridor.  Cardiovascular: Negative.  Negative for chest pain, palpitations,  orthopnea, claudication, leg swelling and PND.  Gastrointestinal: Negative.  Negative for heartburn, nausea, vomiting, abdominal pain, diarrhea, constipation, blood in stool and melena.  Genitourinary: Negative.  Negative for dysuria, urgency, frequency, hematuria and flank pain.  Musculoskeletal: Negative.  Negative for myalgias, back pain, joint pain, falls and neck pain.  Skin: Negative.  Negative for itching and rash.  Neurological: Negative.  Negative for dizziness, tingling, tremors, sensory change, speech change, focal weakness, seizures, loss of consciousness, weakness and headaches.  Endo/Heme/Allergies: Negative.  Negative for environmental allergies and polydipsia. Does not bruise/bleed easily.  Psychiatric/Behavioral: Negative for depression, suicidal ideas, hallucinations, memory loss and substance abuse. The patient is nervous/anxious. The patient does not have insomnia.     Vital Signs: Blood pressure 148/92, pulse 101, temperature 98.3 F (36.8 C), temperature source Oral, resp. rate 18, height 5' (1.524 m), weight 58.968 kg (130 lb), SpO2 96 %.  Weight trends: Filed Weights   12/15/15 0241  Weight: 58.968 kg (130 lb)    Physical Exam: General: NAD, elderly frail   Head: Normocephalic, atraumatic. Moist oral mucosal membranes  Eyes: Anicteric, PERRL  Neck: Supple, trachea midline  Lungs:  Clear to auscultation  Heart: Regular rate and rhythm  Abdomen:  Soft, nontender,   Extremities: no peripheral edema.  Neurologic: Nonfocal, moving all four extremities  Skin: No lesions        Lab results: Basic Metabolic Panel:  Recent Labs Lab 12/16/15 0528  12/17/15 0624 12/17/15 0920 12/17/15 1635 12/18/15 0646  NA 124*  < > 126* 125* 126* 126*  K 3.0*  --  3.4*  --   --  3.8  CL 95*  --  94*  --   --  97*  CO2 24  --  25  --   --  23  GLUCOSE 101*  --  142*  --   --  99  BUN 8  --  8  --   --  13  CREATININE 0.35*  --  0.42*  --   --  0.56  CALCIUM 8.1*  --   8.6*  --   --  8.8*  MG 1.6*  --   --   --   --  2.0  < > = values in this interval not displayed.  Liver Function Tests:  Recent Labs Lab 12/15/15 0252  AST 27  ALT 17  ALKPHOS 63  BILITOT 0.8  PROT 7.1  ALBUMIN 4.4   No results for input(s): LIPASE, AMYLASE in the last 168 hours. No results for input(s): AMMONIA in the last 168 hours.  CBC:  Recent Labs Lab 12/15/15 0252 12/16/15 0528  WBC 6.7 5.1  NEUTROABS 4.1  --   HGB 12.9 12.4  HCT 38.1 36.7  MCV 84.4 84.5  PLT 167 156    Cardiac Enzymes:  Recent Labs Lab 12/15/15 0252  TROPONINI <0.03    BNP: Invalid input(s): POCBNP  CBG: No results for input(s): GLUCAP in the last 168 hours.  Microbiology: Results for orders placed or performed in visit on 02/13/14  Clostridium Difficile [facility])     Status: None   Collection Time: 02/14/14 12:26 AM  Result Value Ref Range Status   Micro Text Report   Final       C.DIFFICILE ANTIGEN       C.DIFFICILE GDH ANTIGEN : NEGATIVE   C.DIFFICILE TOXIN A/B     C.DIFFICILE TOXINS A AND B : NEGATIVE  INTERPRETATION            Negative for C. difficile.    ANTIBIOTIC                                                        Coagulation Studies: No results for input(s): LABPROT, INR in the last 72 hours.  Urinalysis:  Recent Labs  12/16/15 2058  COLORURINE YELLOW*  LABSPEC 1.004*  PHURINE 7.0  GLUCOSEU NEGATIVE  HGBUR 2+*  BILIRUBINUR NEGATIVE  KETONESUR NEGATIVE  PROTEINUR NEGATIVE  NITRITE NEGATIVE  LEUKOCYTESUR 3+*      Imaging:  No results found.   Assessment & Plan: [name] is a 82 y.o. white female with hypertension, hyperlipidemia, anxiety, chronic lower back pain and urinary retention, who was admitted to [facility] on 12/15/2015.   1. Hyponatremia:  hypo-osmolar euvolemic with urine osm >100. Most consistent with SIADH. However will need further work up - Check urine and serum osm, check urine sodium - Continue sodium chloride  tablets - Fluid restriction 1231mL/day  2. Hypertension: not on a thiazide diuretic. Blood pressure at goal.  - amlodipine, carvedilol, tamsulosin  3. Urinary Tract Infection: ceftin.      LOS: [place], [place] 3/9/20178:57 AM

## 2015-12-19 LAB — BASIC METABOLIC PANEL
Anion gap: 6 (ref 5–15)
BUN: 11 mg/dL (ref 6–20)
CHLORIDE: 100 mmol/L — AB (ref 101–111)
CO2: 25 mmol/L (ref 22–32)
Calcium: 9.2 mg/dL (ref 8.9–10.3)
Creatinine, Ser: 0.6 mg/dL (ref 0.44–1.00)
GFR calc Af Amer: >60 mL/min (ref >60)
GFR calc non Af Amer: >60 mL/min (ref >60)
GLUCOSE: 97 mg/dL (ref 65–99)
POTASSIUM: 4 mmol/L (ref 3.5–5.1)
Sodium: 131 mmol/L — ABNORMAL LOW (ref 135–145)

## 2015-12-19 LAB — MAGNESIUM: Magnesium: 2.2 mg/dL (ref 1.7–2.4)

## 2015-12-19 MED ORDER — SODIUM CHLORIDE 1 G PO TABS: 1.0000 g | ORAL_TABLET | Freq: Two times a day (BID) | ORAL | Status: DC

## 2015-12-19 MED ORDER — CEFUROXIME AXETIL 500 MG PO TABS: 500.0000 mg | ORAL_TABLET | Freq: Two times a day (BID) | ORAL | Status: DC

## 2015-12-19 MED ORDER — TAMSULOSIN HCL 0.4 MG PO CAPS: 0.4000 mg | ORAL_CAPSULE | Freq: Every day | ORAL | Status: DC

## 2015-12-19 NOTE — Discharge Summary (Signed)
[place] [facility] [place] NAME: [name]    MR#:  [id number]  DATE OF BIRTH:  1925/05/27  DATE OF ADMISSION:  12/15/2015 ADMITTING PHYSICIAN: Saundra Shelling, MD  DATE OF DISCHARGE: *12/19/2015  PRIMARY CARE PHYSICIAN: Lavera Guise, MD    ADMISSION DIAGNOSIS:  Hyponatremia [E87.1] Weakness [R53.1]  DISCHARGE DIAGNOSIS:  Principal Problem:   Hyponatremia   SECONDARY DIAGNOSIS:   Past Medical History  Diagnosis Date  . High cholesterol   . Hypertension   . Anxiety   . Chronic back pain   . Pneumonia   . History of urinary retention     HOSPITAL COURSE:    82 year old female with past medical history of anxiety, hypertension, hyperlipidemia, chronic back pain, history of urinary retention who presents to the hospital due to generalized weakness and malaise after a flulike illness and noted to be hyponatremic.  1 Hyponatremia : Patient is asymptomatic  Urine and serum studies are consistent with SIADH  Continue 1200 cc fluid restriction and sodium chloride tablets.  She will follow up with Dr Abigail Butts on Tuesday for repeat NA level.  2 Essential HTN: Continue Coreg and Norvasc.  Blood pressure acceptable.   3 Anxiety: Continue Ativan PRN.   4 History of Urinary Retention: She can continue Flomax.     5. Chronic Back Pain: Continue. Tramadol and Zanaflex.   6 hHpo-magnesium and hypokalemia: This was repleted.   7. Urinary tract infection: Continue Ceftin for 3 more days  DISCHARGE CONDITIONS AND DIET:   Stable for discharge 1200 cc fluid restriction and low sodium diet  CONSULTS OBTAINED:  Treatment Team:  Lavonia Dana, MD  DRUG ALLERGIES:   Allergies  Allergen Reactions  . Cortisone Other (See Comments)    Headache   . Morphine And Related Other (See Comments)    headache  . Prednisone Other (See Comments)    Headache   . Sulfa Antibiotics Nausea Only    DISCHARGE MEDICATIONS:    Current Discharge Medication List    START taking these medications   Details  cefUROXime (CEFTIN) 500 MG tablet Take 1 tablet (500 mg total) by mouth 2 (two) times daily with a meal. Qty: 8 tablet, Refills: 0    sodium chloride 1 g tablet Take 1 tablet (1 g total) by mouth 2 (two) times daily with a meal. Qty: 20 tablet, Refills: 0    tamsulosin (FLOMAX) 0.4 MG CAPS capsule Take 1 capsule (0.4 mg total) by mouth daily. Qty: 30 capsule, Refills: 0      CONTINUE these medications which have NOT CHANGED   Details  amLODipine (NORVASC) 5 MG tablet Take 5 mg by mouth daily.    carvedilol (COREG) 6.25 MG tablet Take 6.25 mg by mouth 2 (two) times daily with a meal.    HYDROcodone-acetaminophen (NORCO) 10-325 MG tablet Take 1 tablet by mouth 4 (four) times daily as needed.     LORazepam (ATIVAN) 0.5 MG tablet Take 0.5 mg by mouth 3 (three) times daily as needed.     mirtazapine (REMERON) 30 MG tablet Take 30 mg by mouth at bedtime.    traMADol-acetaminophen (ULTRACET) 37.5-325 MG tablet Take 1 tablet by mouth every 8 (eight) hours as needed for moderate pain. Qty: 30 tablet, Refills: 0      STOP taking these medications     tiZANidine (ZANAFLEX) 2 MG tablet               Today  CHIEF COMPLAINT:  Patient is doing well this morning. She is ready to be discharged home.   VITAL SIGNS:  Blood pressure 120/94, pulse 103, temperature 98.2 F (36.8 C), temperature source Oral, resp. rate 16, height 5' (1.524 m), weight 58.968 kg (130 lb), SpO2 95 %.   REVIEW OF SYSTEMS:  Review of Systems  Constitutional: Negative for fever, chills and malaise/fatigue.  HENT: Negative for sore throat.   Eyes: Negative for blurred vision.  Respiratory: Negative for cough, hemoptysis, shortness of breath and wheezing.   Cardiovascular: Negative for chest pain, palpitations and leg swelling.  Gastrointestinal: Negative for nausea, vomiting, abdominal pain, diarrhea and blood in  stool.  Genitourinary: Negative for dysuria.  Musculoskeletal: Negative for back pain.  Neurological: Negative for dizziness, tremors and headaches.  Endo/Heme/Allergies: Does not bruise/bleed easily.     PHYSICAL EXAMINATION:  GENERAL:  82 y.o.-year-old patient lying in the bed with no acute distress.  NECK:  Supple, no jugular venous distention. No thyroid enlargement, no tenderness.  LUNGS: Normal breath sounds bilaterally, no wheezing, rales,rhonchi  No use of accessory muscles of respiration.  CARDIOVASCULAR: S1, S2 normal. No murmurs, rubs, or gallops.  ABDOMEN: Soft, non-tender, non-distended. Bowel sounds present. No organomegaly or mass.  EXTREMITIES: No pedal edema, cyanosis, or clubbing.  PSYCHIATRIC: The patient is alert and oriented x 3.  SKIN: No obvious rash, lesion, or ulcer.   DATA REVIEW:   CBC  Recent Labs Lab 12/16/15 0528  WBC 5.1  HGB 12.4  HCT 36.7  PLT 156    Chemistries   Recent Labs Lab 12/18/15 0819  12/19/15 0535  NA 126*  < > 131*  K 3.8  --  4.0  CL 95*  --  100*  CO2 22  --  25  GLUCOSE 107*  --  97  BUN 12  --  11  CREATININE 0.56  --  0.60  CALCIUM 8.9  --  9.2  MG  --   --  2.2  AST 45*  --   --   ALT 24  --   --   ALKPHOS 64  --   --   BILITOT 0.5  --   --   < > = values in this interval not displayed.  Cardiac Enzymes  Recent Labs Lab 12/15/15 0252  TROPONINI <0.03    Microbiology Results  @[email or web address] @  RADIOLOGY:  No results found.    Management plans discussed with the patient and she is in agreement. Stable for discharge home with home health  Patient should follow up with PCP and Dr. Abigail Butts on Tuesday  CODE STATUS:     Code Status Orders        Start     Ordered   12/15/15 0815  Full code   Continuous     12/15/15 0814    Code Status History    Date Active Date Inactive Code Status Order ID Comments User Context   This patient has a current code status but no historical code status.       TOTAL TIME TAKING CARE OF THIS PATIENT: 35 minutes.    Note: This dictation was prepared with Dragon dictation along with smaller phrase technology. Any transcriptional errors that result from this process are unintentional.  Terrian Ridlon M.D on 12/19/2015 at 10:29 AM  Between 7am to 6pm - Pager - [phone] After 6pm go to [email or web address] - password [email or web address] [facility]  [facility]  Office  [phone]  CC: Primary care physician;  Lavera Guise, MD

## 2015-12-19 NOTE — Care Management (Signed)
Patient declined home health services. Case closed.

## 2015-12-19 NOTE — Care Management Important Message (Signed)
Important Message  Patient Details  Name: GWENIVERE BOAK MRN: [id number] Date of Birth: [birth year]   Medicare Important Message Given:  Yes    Juliann Pulse A Jani Ploeger 12/19/2015, 11:17 AM

## 2015-12-19 NOTE — Progress Notes (Signed)
[facility]  ROUNDING NOTE   Subjective:   Laying in bed.  Na 131 (130)  NaCl 1 g PO bid  Objective:  Vital signs in last 24 hours:  Temp:  [98 F (36.7 C)-98.4 F (36.9 C)] 98.2 F (36.8 C) (03/10 0810) Pulse Rate:  [81-103] 103 (03/10 0810) Resp:  [16-18] 16 (03/10 0810) BP: (99-122)/(62-94) 120/94 mmHg (03/10 0810) SpO2:  [93 %-95 %] 95 % (03/10 0810)  Weight change:  Filed Weights   12/15/15 0241  Weight: 58.968 kg (130 lb)    Intake/Output: I/O last 3 completed shifts: In: 1410 [P.O.:1410] Out: 800 [Urine:800]   Intake/Output this shift:  Total I/O In: 360 [P.O.:360] Out: -   Physical Exam: General: NAD, frail  Head: Normocephalic, atraumatic. Moist oral mucosal membranes  Eyes: Anicteric, PERRL  Neck: Supple, trachea midline  Lungs:  Clear to auscultation  Heart: Regular rate and rhythm  Abdomen:  Soft, nontender,   Extremities: no peripheral edema.  Neurologic: Nonfocal, moving all four extremities  Skin: No lesions       Basic Metabolic Panel:  Recent Labs Lab 12/16/15 0528  12/17/15 0624  12/17/15 1635 12/18/15 0646 12/18/15 0819 12/18/15 1748 12/19/15 0535  NA 124*  < > 126*  < > 126* 126* 126* 130* 131*  K 3.0*  --  3.4*  --   --  3.8 3.8  --  4.0  CL 95*  --  94*  --   --  97* 95*  --  100*  CO2 24  --  25  --   --  23 22  --  25  GLUCOSE 101*  --  142*  --   --  99 107*  --  97  BUN 8  --  8  --   --  13 12  --  11  CREATININE 0.35*  --  0.42*  --   --  0.56 0.56  --  0.60  CALCIUM 8.1*  --  8.6*  --   --  8.8* 8.9  --  9.2  MG 1.6*  --   --   --   --  2.0  --   --  2.2  < > = values in this interval not displayed.  Liver Function Tests:  Recent Labs Lab 12/15/15 0252 12/18/15 0819  AST 27 45*  ALT 17 24  ALKPHOS 63 64  BILITOT 0.8 0.5  PROT 7.1 6.9  ALBUMIN 4.4 3.9   No results for input(s): LIPASE, AMYLASE in the last 168 hours. No results for input(s): AMMONIA in the last 168 hours.  CBC:  Recent  Labs Lab 12/15/15 0252 12/16/15 0528  WBC 6.7 5.1  NEUTROABS 4.1  --   HGB 12.9 12.4  HCT 38.1 36.7  MCV 84.4 84.5  PLT 167 156    Cardiac Enzymes:  Recent Labs Lab 12/15/15 0252  TROPONINI <0.03    BNP: Invalid input(s): POCBNP  CBG: No results for input(s): GLUCAP in the last 168 hours.  Microbiology: Results for orders placed or performed in visit on 02/13/14  Clostridium Difficile [facility])     Status: None   Collection Time: 02/14/14 12:26 AM  Result Value Ref Range Status   Micro Text Report   Final       C.DIFFICILE ANTIGEN       C.DIFFICILE GDH ANTIGEN : NEGATIVE   C.DIFFICILE TOXIN A/B     C.DIFFICILE TOXINS A AND B : NEGATIVE   INTERPRETATION  Negative for C. difficile.    ANTIBIOTIC                                                        Coagulation Studies: No results for input(s): LABPROT, INR in the last 72 hours.  Urinalysis:  Recent Labs  12/16/15 2058  COLORURINE YELLOW*  LABSPEC 1.004*  PHURINE 7.0  GLUCOSEU NEGATIVE  HGBUR 2+*  BILIRUBINUR NEGATIVE  KETONESUR NEGATIVE  PROTEINUR NEGATIVE  NITRITE NEGATIVE  LEUKOCYTESUR 3+*      Imaging: No results found.   Medications:     . amLODipine  5 mg Oral Daily  . carvedilol  6.25 mg Oral BID WC  . cefUROXime  500 mg Oral BID WC  . docusate sodium  100 mg Oral BID  . enoxaparin (LOVENOX) injection  40 mg Subcutaneous Q24H  . mirtazapine  30 mg Oral QHS  . sodium chloride  1 g Oral BID WC  . tamsulosin  0.4 mg Oral Daily   acetaminophen **OR** acetaminophen, alum & mag hydroxide-simeth, benzonatate, HYDROcodone-acetaminophen, LORazepam, ondansetron **OR** ondansetron (ZOFRAN) IV, polyethylene glycol, sodium chloride, tiZANidine, traMADol-acetaminophen  Assessment/ Plan:  Ms. [name] is a 82 y.o. white female with hypertension, hyperlipidemia, anxiety, chronic lower back pain and urinary retention, who was admitted to [facility] on 12/15/2015.   1. Hyponatremia:  hypo-osmolar euvolemic. Most consistent with SIADH. However will need further work up  - Continue sodium chloride tablets - Fluid restriction 1267mL/day  2. Hypertension: not on a thiazide diuretic. Blood pressure at goal.  - amlodipine, carvedilol, tamsulosin  3. Urinary Tract Infection: ceftin.    LOS: [place], [place] 3/10/201711:34 AM

## 2016-01-01 DIAGNOSIS — M150 Primary generalized (osteo)arthritis: Secondary | ICD-10-CM | POA: Diagnosis not present

## 2016-01-01 DIAGNOSIS — I10 Essential (primary) hypertension: Secondary | ICD-10-CM | POA: Diagnosis not present

## 2016-01-01 DIAGNOSIS — R1084 Generalized abdominal pain: Secondary | ICD-10-CM | POA: Diagnosis not present

## 2016-01-01 DIAGNOSIS — N390 Urinary tract infection, site not specified: Secondary | ICD-10-CM | POA: Diagnosis not present

## 2016-01-08 DIAGNOSIS — H532 Diplopia: Secondary | ICD-10-CM | POA: Diagnosis not present

## 2016-01-13 DIAGNOSIS — M25861 Other specified joint disorders, right knee: Secondary | ICD-10-CM | POA: Diagnosis not present

## 2016-01-13 DIAGNOSIS — S22000A Wedge compression fracture of unspecified thoracic vertebra, initial encounter for closed fracture: Secondary | ICD-10-CM | POA: Diagnosis not present

## 2016-01-13 DIAGNOSIS — M47816 Spondylosis without myelopathy or radiculopathy, lumbar region: Secondary | ICD-10-CM | POA: Diagnosis not present

## 2016-01-21 DIAGNOSIS — J340 Abscess, furuncle and carbuncle of nose: Secondary | ICD-10-CM | POA: Diagnosis not present

## 2016-01-29 DIAGNOSIS — J329 Chronic sinusitis, unspecified: Secondary | ICD-10-CM | POA: Diagnosis not present

## 2016-01-29 DIAGNOSIS — I10 Essential (primary) hypertension: Secondary | ICD-10-CM | POA: Diagnosis not present

## 2016-01-29 DIAGNOSIS — M5441 Lumbago with sciatica, right side: Secondary | ICD-10-CM | POA: Diagnosis not present

## 2016-01-29 DIAGNOSIS — F411 Generalized anxiety disorder: Secondary | ICD-10-CM | POA: Diagnosis not present

## 2016-02-19 DIAGNOSIS — J0111 Acute recurrent frontal sinusitis: Secondary | ICD-10-CM | POA: Diagnosis not present

## 2016-02-24 ENCOUNTER — Other Ambulatory Visit: Payer: Self-pay | Admitting: Otolaryngology

## 2016-02-24 ENCOUNTER — Ambulatory Visit
Admission: RE | Admit: 2016-02-24 | Discharge: 2016-02-24 | Disposition: A | Discharge status: Home / Self Care | Payer: Medicare Other | Source: Ambulatory Visit | Attending: Otolaryngology | Admitting: Otolaryngology

## 2016-02-24 DIAGNOSIS — Q2546 Tortuous aortic arch: Secondary | ICD-10-CM | POA: Insufficient documentation

## 2016-02-24 DIAGNOSIS — R05 Cough: Secondary | ICD-10-CM | POA: Insufficient documentation

## 2016-02-24 DIAGNOSIS — R059 Cough, unspecified: Secondary | ICD-10-CM

## 2016-02-24 DIAGNOSIS — H9202 Otalgia, left ear: Secondary | ICD-10-CM | POA: Diagnosis not present

## 2016-02-24 DIAGNOSIS — R51 Headache: Secondary | ICD-10-CM | POA: Diagnosis not present

## 2016-03-17 DIAGNOSIS — R0602 Shortness of breath: Secondary | ICD-10-CM | POA: Diagnosis not present

## 2016-03-17 DIAGNOSIS — E871 Hypo-osmolality and hyponatremia: Secondary | ICD-10-CM | POA: Diagnosis not present

## 2016-03-17 DIAGNOSIS — J3481 Nasal mucositis (ulcerative): Secondary | ICD-10-CM | POA: Diagnosis not present

## 2016-03-17 DIAGNOSIS — N329 Bladder disorder, unspecified: Secondary | ICD-10-CM | POA: Diagnosis not present

## 2016-03-17 DIAGNOSIS — J329 Chronic sinusitis, unspecified: Secondary | ICD-10-CM | POA: Diagnosis not present

## 2016-03-17 DIAGNOSIS — I83813 Varicose veins of bilateral lower extremities with pain: Secondary | ICD-10-CM | POA: Diagnosis not present

## 2016-03-18 ENCOUNTER — Emergency Department
Admission: EM | Admit: 2016-03-18 | Discharge: 2016-03-18 | Disposition: A | Discharge status: Home / Self Care | Payer: Medicare Other | Attending: Emergency Medicine | Admitting: Emergency Medicine

## 2016-03-18 ENCOUNTER — Encounter: Payer: Self-pay | Admitting: Emergency Medicine

## 2016-03-18 ENCOUNTER — Emergency Department: Payer: Medicare Other

## 2016-03-18 DIAGNOSIS — M7122 Synovial cyst of popliteal space [Baker], left knee: Secondary | ICD-10-CM

## 2016-03-18 DIAGNOSIS — Z79899 Other long term (current) drug therapy: Secondary | ICD-10-CM | POA: Diagnosis not present

## 2016-03-18 DIAGNOSIS — I10 Essential (primary) hypertension: Secondary | ICD-10-CM | POA: Diagnosis not present

## 2016-03-18 DIAGNOSIS — M79605 Pain in left leg: Secondary | ICD-10-CM

## 2016-03-18 DIAGNOSIS — M7989 Other specified soft tissue disorders: Secondary | ICD-10-CM

## 2016-03-18 DIAGNOSIS — R2242 Localized swelling, mass and lump, left lower limb: Secondary | ICD-10-CM | POA: Diagnosis present

## 2016-03-18 LAB — CBC
HCT: 41 % (ref 35.0–47.0)
Hemoglobin: 13.7 g/dL (ref 12.0–16.0)
MCH: 28.4 pg (ref 26.0–34.0)
MCHC: 33.3 g/dL (ref 32.0–36.0)
MCV: 85.3 fL (ref 80.0–100.0)
PLATELETS: 219 10*3/uL (ref 150–440)
RBC: 4.81 MIL/uL (ref 3.80–5.20)
RDW: 13.9 % (ref 11.5–14.5)
WBC: 7.6 10*3/uL (ref 3.6–11.0)

## 2016-03-18 LAB — BASIC METABOLIC PANEL
ANION GAP: 7 (ref 5–15)
BUN: 15 mg/dL (ref 6–20)
CALCIUM: 10.1 mg/dL (ref 8.9–10.3)
CO2: 24 mmol/L (ref 22–32)
Chloride: 102 mmol/L (ref 101–111)
Creatinine, Ser: 0.59 mg/dL (ref 0.44–1.00)
GLUCOSE: 98 mg/dL (ref 65–99)
POTASSIUM: 4.2 mmol/L (ref 3.5–5.1)
SODIUM: 133 mmol/L — AB (ref 135–145)

## 2016-03-18 NOTE — ED Notes (Signed)
Pt taken to car by via RN. Pt in no acute distress at this time and verbalized understanding of d/c follow up care.

## 2016-03-18 NOTE — Discharge Instructions (Signed)
You were workup was reassuring today and your ultrasound demonstrated that you have a large popliteal (Baker's) stopped behind her left knee.  Please read through the information below and follow the recommendations.  Follow up with your orthopedic surgeon at the next available opportunity.  Return to the emergency Department if you develop any new or worsening symptoms that concern you.

## 2016-03-18 NOTE — ED Notes (Signed)
Pain and swelling behind left knee for a week now. Pt appears in no distress.

## 2016-03-18 NOTE — ED Provider Notes (Signed)
[facility] Emergency Department Provider Note  ____________________________________________  Time seen: Approximately 2:59 PM  I have reviewed the triage vital signs and the nursing notes.   HISTORY  Chief Complaint Leg Swelling    HPI [name] is a 82 y.o. female onset of swelling and mild pain behind her left knee for about one week.  She says that it is worse with ambulation although it is not affecting her ambulation significantly.  She uses a cane when she is out and about.  She has similar symptoms in her right leg and has been diagnosed previously with a popliteal cyst.  She has an orthopedic surgeon who follows her for this.  She is concerned she may have a blood clot.  She saw her primary care doctor yesterday for an outpatient ultrasound 2 weeks from now, but she did not feel she could wait so she came to the ED today.  She denies fever/chills, chest pain, shortness of breath, nausea, vomiting, diarrhea, dysuria. Being upright and ambulatory makes the mild pain in her left leg a little bit worse and lying down makes it better.  She also reports intermittent peripheral edema for several weeks and that it was worse yesterday when she saw her PCP but it is much better today.   Past Medical History  Diagnosis Date  . High cholesterol   . Hypertension   . Anxiety   . Chronic back pain   . Pneumonia   . History of urinary retention     Patient Active Problem List   Diagnosis Date Noted  . Hyponatremia 12/15/2015    Past Surgical History  Procedure Laterality Date  . Back surgery    . Breast biopsy Right 2002    neg  . Abdominal hysterectomy      Current Outpatient Rx  Name  Route  Sig  Dispense  Refill  . amLODipine (NORVASC) 5 MG tablet   Oral   Take 5 mg by mouth daily.         . carvedilol (COREG) 6.25 MG tablet   Oral   Take 6.25 mg by mouth 2 (two) times daily with a meal.         . cefUROXime (CEFTIN) 500 MG  tablet   Oral   Take 1 tablet (500 mg total) by mouth 2 (two) times daily with a meal.   8 tablet   0   . HYDROcodone-acetaminophen (NORCO) 10-325 MG tablet   Oral   Take 1 tablet by mouth 4 (four) times daily as needed.          [name] LORazepam (ATIVAN) 0.5 MG tablet   Oral   Take 0.5 mg by mouth 3 (three) times daily as needed.          . mirtazapine (REMERON) 30 MG tablet   Oral   Take 30 mg by mouth at bedtime.         . sodium chloride 1 g tablet   Oral   Take 1 tablet (1 g total) by mouth 2 (two) times daily with a meal.   20 tablet   0   . tamsulosin (FLOMAX) 0.4 MG CAPS capsule   Oral   Take 1 capsule (0.4 mg total) by mouth daily.   30 capsule   0   . traMADol-acetaminophen (ULTRACET) 37.5-325 MG tablet   Oral   Take 1 tablet by mouth every 8 (eight) hours as needed for moderate pain.   30 tablet  0     Allergies Cortisone; Morphine and related; Prednisone; and Sulfa antibiotics  Family History  Problem Relation Age of Onset  . Breast cancer Sister 43  . Breast cancer Maternal Grandmother 62  . Breast cancer Sister 59    Social History Social History  Substance Use Topics  . Smoking status: Never Smoker   . Smokeless tobacco: None  . Alcohol Use: No    Review of Systems Constitutional: No fever/chills Eyes: No visual changes. ENT: No sore throat. Cardiovascular: Denies chest pain. Respiratory: Denies shortness of breath. Gastrointestinal: No abdominal pain.  No nausea, no vomiting.  No diarrhea.  No constipation. Genitourinary: Negative for dysuria. Musculoskeletal: Pain and swelling behind the left knee. Skin: Negative for rash. Neurological: Negative for headaches, focal weakness or numbness.  10-point ROS otherwise negative.  ____________________________________________   PHYSICAL EXAM:  VITAL SIGNS: ED Triage Vitals  Enc Vitals Group     BP 03/18/16 1231 128/85 mmHg     Pulse Rate 03/18/16 1231 85     Resp 03/18/16 1231  18     Temp 03/18/16 1231 98.4 F (36.9 C)     Temp Source 03/18/16 1231 Oral     SpO2 03/18/16 1231 96 %     Weight 03/18/16 1231 133 lb (60.328 kg)     Height 03/18/16 1231 5' (1.524 m)     Head Cir --      Peak Flow --      Pain Score 03/18/16 1232 8     Pain Loc --      Pain Edu? --      Excl. in [place]? --     Constitutional: Alert and oriented. Well appearing and in no acute distress. Eyes: Conjunctivae are normal. PERRL. EOMI.  Strabismus (chronic) Head: Atraumatic. Nose: No congestion/rhinnorhea. Mouth/Throat: Mucous membranes are moist.  Oropharynx non-erythematous. Neck: No stridor.  No meningeal signs.   Cardiovascular: Normal rate, regular rhythm. Good peripheral circulation. Grossly normal heart sounds.   Respiratory: Normal respiratory effort.  No retractions. Lungs CTAB. Gastrointestinal: Soft and nontender. No distention.  Musculoskeletal: Palpable mass in the left popliteal fossa.  Highly tender to palpation.  No firm or distended veins.  Neurovascularly intact throughout the left lower extremity.  Extremities otherwise unremarkable.  Patient ambulatory with her cane, not requiring assistance. Neurologic:  Normal speech and language. No gross focal neurologic deficits are appreciated.  Skin:  Skin is warm, dry and intact. No rash noted. Psychiatric: Mood and affect are normal. Speech and behavior are normal.  ____________________________________________   LABS (all labs ordered are listed, but only abnormal results are displayed)  Labs Reviewed  BASIC METABOLIC PANEL - Abnormal; Notable for the following:    Sodium 133 (*)    All other components within normal limits  CBC   ____________________________________________  EKG  None ____________________________________________  RADIOLOGY   US Venous Img Lower Unilateral Left  03/18/2016  CLINICAL DATA:  Left leg pain and swelling posterior to the knee for the past week. EXAM: LEFT LOWER EXTREMITY VENOUS  DOPPLER ULTRASOUND TECHNIQUE: Gray-scale sonography with graded compression, as well as color Doppler and duplex ultrasound were performed to evaluate the lower extremity deep venous systems from the level of the common femoral vein and including the common femoral, femoral, profunda femoral, popliteal and calf veins including the posterior tibial, peroneal and gastrocnemius veins when visible. The superficial great saphenous vein was also interrogated. Spectral Doppler was utilized to evaluate flow at rest and with distal augmentation  maneuvers in the common femoral, femoral and popliteal veins. COMPARISON:  None. FINDINGS: Contralateral Common Femoral Vein: Respiratory phasicity is normal and symmetric with the symptomatic side. No evidence of thrombus. Normal compressibility. Common Femoral Vein: No evidence of thrombus. Normal compressibility, respiratory phasicity and response to augmentation. Saphenofemoral Junction: No evidence of thrombus. Normal compressibility and flow on color Doppler imaging. Profunda Femoral Vein: No evidence of thrombus. Normal compressibility and flow on color Doppler imaging. Femoral Vein: No evidence of thrombus. Normal compressibility, respiratory phasicity and response to augmentation. Popliteal Vein: No evidence of thrombus. Normal compressibility, respiratory phasicity and response to augmentation. Calf Veins: No evidence of thrombus. Normal compressibility and flow on color Doppler imaging. Superficial Great Saphenous Vein: No evidence of thrombus. Normal compressibility and flow on color Doppler imaging. Venous Reflux:  None. Other Findings: A large popliteal cyst is demonstrated. This measures 7.0 x 4.0 x 2.2 cm. IMPRESSION: 1. No evidence of deep venous thrombosis. 2. Large popliteal cyst. Electronically Signed   By: Claudie Revering M.D.   On: 03/18/2016 13:38    ____________________________________________   PROCEDURES  Procedure(s) performed: None  Critical Care  performed: No ____________________________________________   INITIAL IMPRESSION / ASSESSMENT AND PLAN / ED COURSE  Pertinent labs & imaging results that were available during my care of the patient were reviewed by me and considered in my medical decision making (see chart for details).  Labs are unremarkable.  Ultrasound notable for a large left popliteal cyst.  I gave the patient the diagnosis and she felt reassured she does not have blood clot.  I encouraged close follow-up with her orthopedic surgeon.  I gave my usual and customary return precautions.      ____________________________________________  FINAL CLINICAL IMPRESSION(S) / ED DIAGNOSES  Final diagnoses:  Popliteal cyst, unruptured, left     MEDICATIONS GIVEN DURING THIS VISIT:  Medications - No data to display   NEW OUTPATIENT MEDICATIONS STARTED DURING THIS VISIT:  New Prescriptions   No medications on file      Note:  This document was prepared using Dragon voice recognition software and may include unintentional dictation errors.   Hinda Kehr, MD 03/18/16 1524

## 2016-04-01 DIAGNOSIS — M545 Low back pain: Secondary | ICD-10-CM | POA: Diagnosis not present

## 2016-04-01 DIAGNOSIS — M47817 Spondylosis without myelopathy or radiculopathy, lumbosacral region: Secondary | ICD-10-CM | POA: Diagnosis not present

## 2016-04-07 ENCOUNTER — Other Ambulatory Visit: Payer: Self-pay | Admitting: Neurosurgery

## 2016-04-07 DIAGNOSIS — M47816 Spondylosis without myelopathy or radiculopathy, lumbar region: Secondary | ICD-10-CM

## 2016-04-23 ENCOUNTER — Ambulatory Visit
Admission: RE | Admit: 2016-04-23 | Discharge: 2016-04-23 | Disposition: A | Discharge status: Home / Self Care | Payer: Medicare Other | Source: Ambulatory Visit | Attending: Neurosurgery | Admitting: Neurosurgery

## 2016-04-23 DIAGNOSIS — M47816 Spondylosis without myelopathy or radiculopathy, lumbar region: Secondary | ICD-10-CM | POA: Diagnosis not present

## 2016-04-23 DIAGNOSIS — M4327 Fusion of spine, lumbosacral region: Secondary | ICD-10-CM | POA: Diagnosis not present

## 2016-04-23 MED ORDER — IOPAMIDOL (ISOVUE-M 200) INJECTION 41%
1.0000 mL | Freq: Once | INTRAMUSCULAR | Status: AC
  Administered 2016-04-23: 1 mL via EPIDURAL

## 2016-04-23 MED ORDER — METHYLPREDNISOLONE ACETATE 40 MG/ML INJ SUSP (RADIOLOG
120.0000 mg | Freq: Once | INTRAMUSCULAR | Status: AC
  Administered 2016-04-23: 120 mg via EPIDURAL

## 2016-04-23 NOTE — Discharge Instructions (Signed)

## 2016-05-04 DIAGNOSIS — M25861 Other specified joint disorders, right knee: Secondary | ICD-10-CM | POA: Diagnosis not present

## 2016-05-04 DIAGNOSIS — S93409A Sprain of unspecified ligament of unspecified ankle, initial encounter: Secondary | ICD-10-CM | POA: Diagnosis not present

## 2016-05-11 DIAGNOSIS — M7120 Synovial cyst of popliteal space [Baker], unspecified knee: Secondary | ICD-10-CM | POA: Diagnosis not present

## 2016-05-11 DIAGNOSIS — M79604 Pain in right leg: Secondary | ICD-10-CM | POA: Diagnosis not present

## 2016-05-11 DIAGNOSIS — F411 Generalized anxiety disorder: Secondary | ICD-10-CM | POA: Diagnosis not present

## 2016-05-11 DIAGNOSIS — I10 Essential (primary) hypertension: Secondary | ICD-10-CM | POA: Diagnosis not present

## 2016-06-17 DIAGNOSIS — N390 Urinary tract infection, site not specified: Secondary | ICD-10-CM | POA: Diagnosis not present

## 2016-06-17 DIAGNOSIS — B372 Candidiasis of skin and nail: Secondary | ICD-10-CM | POA: Diagnosis not present

## 2016-06-17 DIAGNOSIS — K648 Other hemorrhoids: Secondary | ICD-10-CM | POA: Diagnosis not present

## 2016-06-17 DIAGNOSIS — I10 Essential (primary) hypertension: Secondary | ICD-10-CM | POA: Diagnosis not present

## 2016-06-17 DIAGNOSIS — K5900 Constipation, unspecified: Secondary | ICD-10-CM | POA: Diagnosis not present

## 2016-07-20 ENCOUNTER — Other Ambulatory Visit: Payer: Self-pay | Admitting: Otolaryngology

## 2016-07-20 DIAGNOSIS — H9209 Otalgia, unspecified ear: Secondary | ICD-10-CM | POA: Diagnosis not present

## 2016-07-20 DIAGNOSIS — J019 Acute sinusitis, unspecified: Secondary | ICD-10-CM | POA: Diagnosis not present

## 2016-07-20 DIAGNOSIS — R51 Headache: Secondary | ICD-10-CM | POA: Diagnosis not present

## 2016-07-20 DIAGNOSIS — M542 Cervicalgia: Secondary | ICD-10-CM

## 2016-07-30 DIAGNOSIS — J309 Allergic rhinitis, unspecified: Secondary | ICD-10-CM | POA: Diagnosis not present

## 2016-07-30 DIAGNOSIS — I10 Essential (primary) hypertension: Secondary | ICD-10-CM | POA: Diagnosis not present

## 2016-07-30 DIAGNOSIS — J329 Chronic sinusitis, unspecified: Secondary | ICD-10-CM | POA: Diagnosis not present

## 2016-07-30 DIAGNOSIS — B370 Candidal stomatitis: Secondary | ICD-10-CM | POA: Diagnosis not present

## 2016-08-03 ENCOUNTER — Ambulatory Visit: Payer: Medicare Other

## 2016-08-05 DIAGNOSIS — H353134 Nonexudative age-related macular degeneration, bilateral, advanced atrophic with subfoveal involvement: Secondary | ICD-10-CM | POA: Diagnosis not present

## 2016-08-12 DIAGNOSIS — K5900 Constipation, unspecified: Secondary | ICD-10-CM | POA: Diagnosis not present

## 2016-08-12 DIAGNOSIS — I10 Essential (primary) hypertension: Secondary | ICD-10-CM | POA: Diagnosis not present

## 2016-08-12 DIAGNOSIS — J309 Allergic rhinitis, unspecified: Secondary | ICD-10-CM | POA: Diagnosis not present

## 2016-08-12 DIAGNOSIS — M5441 Lumbago with sciatica, right side: Secondary | ICD-10-CM | POA: Diagnosis not present

## 2016-08-12 DIAGNOSIS — F411 Generalized anxiety disorder: Secondary | ICD-10-CM | POA: Diagnosis not present

## 2016-08-12 DIAGNOSIS — I83813 Varicose veins of bilateral lower extremities with pain: Secondary | ICD-10-CM | POA: Diagnosis not present

## 2016-08-12 DIAGNOSIS — Z0001 Encounter for general adult medical examination with abnormal findings: Secondary | ICD-10-CM | POA: Diagnosis not present

## 2016-09-06 ENCOUNTER — Other Ambulatory Visit: Payer: Self-pay | Admitting: Internal Medicine

## 2016-09-06 DIAGNOSIS — Z1231 Encounter for screening mammogram for malignant neoplasm of breast: Secondary | ICD-10-CM

## 2016-09-30 ENCOUNTER — Other Ambulatory Visit: Payer: Self-pay | Admitting: Nurse Practitioner

## 2016-09-30 DIAGNOSIS — R52 Pain, unspecified: Secondary | ICD-10-CM

## 2016-10-01 ENCOUNTER — Other Ambulatory Visit: Payer: Self-pay | Admitting: Nurse Practitioner

## 2016-10-01 ENCOUNTER — Ambulatory Visit
Admission: RE | Admit: 2016-10-01 | Discharge: 2016-10-01 | Disposition: A | Discharge status: Home / Self Care | Payer: Medicare Other | Source: Ambulatory Visit | Attending: Nurse Practitioner | Admitting: Nurse Practitioner

## 2016-10-01 DIAGNOSIS — R52 Pain, unspecified: Secondary | ICD-10-CM

## 2016-10-01 DIAGNOSIS — M1711 Unilateral primary osteoarthritis, right knee: Secondary | ICD-10-CM | POA: Insufficient documentation

## 2016-10-01 DIAGNOSIS — M545 Low back pain: Secondary | ICD-10-CM

## 2016-10-01 DIAGNOSIS — M4316 Spondylolisthesis, lumbar region: Secondary | ICD-10-CM | POA: Insufficient documentation

## 2016-10-01 DIAGNOSIS — M25562 Pain in left knee: Secondary | ICD-10-CM | POA: Insufficient documentation

## 2016-10-01 DIAGNOSIS — S3992XA Unspecified injury of lower back, initial encounter: Secondary | ICD-10-CM | POA: Diagnosis not present

## 2016-10-01 DIAGNOSIS — M7989 Other specified soft tissue disorders: Secondary | ICD-10-CM | POA: Insufficient documentation

## 2016-10-01 DIAGNOSIS — M25561 Pain in right knee: Secondary | ICD-10-CM | POA: Insufficient documentation

## 2016-10-01 DIAGNOSIS — S8991XA Unspecified injury of right lower leg, initial encounter: Secondary | ICD-10-CM | POA: Diagnosis not present

## 2016-10-13 ENCOUNTER — Ambulatory Visit: Payer: Medicare Other

## 2016-10-15 DIAGNOSIS — S22000A Wedge compression fracture of unspecified thoracic vertebra, initial encounter for closed fracture: Secondary | ICD-10-CM | POA: Diagnosis not present

## 2016-10-15 DIAGNOSIS — M47816 Spondylosis without myelopathy or radiculopathy, lumbar region: Secondary | ICD-10-CM | POA: Diagnosis not present

## 2016-10-26 ENCOUNTER — Ambulatory Visit: Payer: Medicare Other | Attending: Internal Medicine

## 2016-12-10 ENCOUNTER — Emergency Department: Payer: Medicare Other

## 2016-12-10 ENCOUNTER — Encounter: Payer: Self-pay | Admitting: Emergency Medicine

## 2016-12-10 ENCOUNTER — Emergency Department
Admission: EM | Admit: 2016-12-10 | Discharge: 2016-12-10 | Disposition: A | Discharge status: Home / Self Care | Payer: Medicare Other | Attending: Emergency Medicine | Admitting: Emergency Medicine

## 2016-12-10 DIAGNOSIS — M62830 Muscle spasm of back: Secondary | ICD-10-CM | POA: Diagnosis not present

## 2016-12-10 DIAGNOSIS — I10 Essential (primary) hypertension: Secondary | ICD-10-CM | POA: Insufficient documentation

## 2016-12-10 DIAGNOSIS — R0789 Other chest pain: Secondary | ICD-10-CM | POA: Insufficient documentation

## 2016-12-10 DIAGNOSIS — Z79899 Other long term (current) drug therapy: Secondary | ICD-10-CM | POA: Diagnosis not present

## 2016-12-10 DIAGNOSIS — R079 Chest pain, unspecified: Secondary | ICD-10-CM | POA: Diagnosis present

## 2016-12-10 LAB — CBC WITH DIFFERENTIAL/PLATELET
BASOS ABS: 0.1 10*3/uL (ref 0–0.1)
BASOS PCT: 2 %
Eosinophils Absolute: 0.3 10*3/uL (ref 0–0.7)
Eosinophils Relative: 4 %
HEMATOCRIT: 36.8 % (ref 35.0–47.0)
HEMOGLOBIN: 12.7 g/dL (ref 12.0–16.0)
Lymphocytes Relative: 34 %
Lymphs Abs: 2.2 10*3/uL (ref 1.0–3.6)
MCH: 29.8 pg (ref 26.0–34.0)
MCHC: 34.5 g/dL (ref 32.0–36.0)
MCV: 86.4 fL (ref 80.0–100.0)
MONOS PCT: 10 %
Monocytes Absolute: 0.7 10*3/uL (ref 0.2–0.9)
NEUTROS ABS: 3.4 10*3/uL (ref 1.4–6.5)
NEUTROS PCT: 50 %
Platelets: 180 10*3/uL (ref 150–440)
RBC: 4.26 MIL/uL (ref 3.80–5.20)
RDW: 13.4 % (ref 11.5–14.5)
WBC: 6.7 10*3/uL (ref 3.6–11.0)

## 2016-12-10 LAB — BASIC METABOLIC PANEL
ANION GAP: 7 (ref 5–15)
BUN: 14 mg/dL (ref 6–20)
CALCIUM: 9.4 mg/dL (ref 8.9–10.3)
CO2: 23 mmol/L (ref 22–32)
Chloride: 102 mmol/L (ref 101–111)
Creatinine, Ser: 0.67 mg/dL (ref 0.44–1.00)
GFR calc Af Amer: >60 mL/min (ref >60)
Glucose, Bld: 109 mg/dL — ABNORMAL HIGH (ref 65–99)
Potassium: 5.1 mmol/L (ref 3.5–5.1)
Sodium: 132 mmol/L — ABNORMAL LOW (ref 135–145)

## 2016-12-10 LAB — TROPONIN I
Troponin I: <0.03 ng/mL (ref <0.03)
Troponin I: <0.03 ng/mL (ref <0.03)

## 2016-12-10 MED ORDER — CYCLOBENZAPRINE HCL 10 MG PO TABS: 10.0000 mg | ORAL_TABLET | Freq: Three times a day (TID) | ORAL | 0 refills | Status: DC | PRN

## 2016-12-10 MED ORDER — FENTANYL CITRATE (PF) 100 MCG/2ML IJ SOLN
75.0000 ug | Freq: Once | INTRAMUSCULAR | Status: AC
  Administered 2016-12-10: 75 ug via INTRAVENOUS

## 2016-12-10 MED ORDER — FENTANYL CITRATE (PF) 100 MCG/2ML IJ SOLN
INTRAMUSCULAR | Status: AC
  Filled 2016-12-10: qty 2

## 2016-12-10 MED ORDER — DILTIAZEM HCL 100 MG IV SOLR
5.0000 mg/h | INTRAVENOUS | Status: DC
  Filled 2016-12-10: qty 100

## 2016-12-10 MED ORDER — ONDANSETRON HCL 4 MG/2ML IJ SOLN
INTRAMUSCULAR | Status: AC
  Filled 2016-12-10: qty 2

## 2016-12-10 MED ORDER — ONDANSETRON HCL 4 MG/2ML IJ SOLN: 4.0000 mg | Freq: Once | INTRAMUSCULAR | Status: DC

## 2016-12-10 MED ORDER — CYCLOBENZAPRINE HCL 10 MG PO TABS
10.0000 mg | ORAL_TABLET | Freq: Once | ORAL | Status: AC
  Administered 2016-12-10: 10 mg via ORAL
  Filled 2016-12-10: qty 1

## 2016-12-10 MED ORDER — IOPAMIDOL (ISOVUE-370) INJECTION 76%
75.0000 mL | Freq: Once | INTRAVENOUS | Status: AC | PRN
  Administered 2016-12-10: 75 mL via INTRAVENOUS

## 2016-12-10 MED ORDER — FENTANYL CITRATE (PF) 100 MCG/2ML IJ SOLN: 75.0000 ug | Freq: Once | INTRAMUSCULAR | Status: DC

## 2016-12-10 MED ORDER — HYDROCODONE-ACETAMINOPHEN 5-325 MG PO TABS
2.0000 | ORAL_TABLET | Freq: Once | ORAL | Status: AC
  Administered 2016-12-10: 2 via ORAL
  Filled 2016-12-10: qty 2

## 2016-12-10 MED ORDER — ONDANSETRON HCL 4 MG/2ML IJ SOLN
4.0000 mg | Freq: Once | INTRAMUSCULAR | Status: AC
  Administered 2016-12-10: 4 mg via INTRAVENOUS

## 2016-12-10 NOTE — ED Triage Notes (Signed)
Pt started acutely this morning with left chest pain radiating to back. Chest pain now resolved but c/o left shoulder blade pain. ASA 324mg  by EMS and 2 NTG spray by EMS without relief.

## 2016-12-10 NOTE — ED Provider Notes (Signed)
[facility] Emergency Department Provider Note ____________________________________________   I have reviewed the triage vital signs and the triage nursing note.  HISTORY  Chief Complaint Chest Pain   Historian Patient  HPI [name] is a 83 y.o. female with history of chronic back pain and anxiety, and no history of coronary artery disease, presents to the emergency department with acute onset left central chest pain going through to the back. Currently she is complaining of 10 out of 10 back pain. Denies any new traumatic injury. States she's never had pain like this before. No recent cough congestion or fevers.   No trouble breathing, palpitations.    Past Medical History:  Diagnosis Date  . Anxiety   . Chronic back pain   . High cholesterol   . History of urinary retention   . Hypertension   . Pneumonia     Patient Active Problem List   Diagnosis Date Noted  . Hyponatremia 12/15/2015    Past Surgical History:  Procedure Laterality Date  . ABDOMINAL HYSTERECTOMY    . BACK SURGERY    . BREAST BIOPSY Right 2002   neg    Prior to Admission medications   Medication Sig Start Date End Date Taking? Authorizing Provider  amLODipine (NORVASC) 5 MG tablet Take 5 mg by mouth daily.   Yes Historical Provider, MD  Calcium 600-200 MG-UNIT tablet Take 1 tablet by mouth daily.   Yes Historical Provider, MD  carvedilol (COREG) 6.25 MG tablet Take 6.25 mg by mouth 2 (two) times daily with a meal.   Yes Historical Provider, MD  HYDROcodone-acetaminophen (NORCO) 10-325 MG tablet Take 1 tablet by mouth 4 (four) times daily as needed.    Yes Historical Provider, MD  LORazepam (ATIVAN) 0.5 MG tablet Take 0.5 mg by mouth 2 (two) times daily.    Yes Historical Provider, MD  meloxicam (MOBIC) 7.5 MG tablet Take 7.5 mg by mouth daily.   Yes Historical Provider, MD  mirtazapine (REMERON) 30 MG tablet Take 30 mg by mouth at bedtime.   Yes Historical Provider,  MD  Multiple Vitamin (MULTIVITAMIN) tablet Take 1 tablet by mouth daily.   Yes Historical Provider, MD  Multiple Vitamins-Minerals (PRESERVISION AREDS 2 PO) Take 1 tablet by mouth 2 (two) times daily.   Yes Historical Provider, MD  nitrofurantoin (MACRODANTIN) 50 MG capsule Take 50 mg by mouth 2 (two) times daily.   Yes Historical Provider, MD  cefUROXime (CEFTIN) 500 MG tablet Take 1 tablet (500 mg total) by mouth 2 (two) times daily with a meal. Patient not taking: Reported on 12/10/2016 12/19/15   Bettey Costa, MD  cyclobenzaprine (FLEXERIL) 10 MG tablet Take 1 tablet (10 mg total) by mouth 3 (three) times daily as needed for muscle spasms. 12/10/16   Lisa Roca, MD  sodium chloride 1 g tablet Take 1 tablet (1 g total) by mouth 2 (two) times daily with a meal. Patient not taking: Reported on 12/10/2016 12/19/15   Bettey Costa, MD  tamsulosin (FLOMAX) 0.4 MG CAPS capsule Take 1 capsule (0.4 mg total) by mouth daily. Patient not taking: Reported on 12/10/2016 12/19/15   Bettey Costa, MD  traMADol-acetaminophen (ULTRACET) 37.5-325 MG tablet Take 1 tablet by mouth every 8 (eight) hours as needed for moderate pain. Patient not taking: Reported on 12/10/2016 08/12/15   Sable Feil, PA-C    Allergies  Allergen Reactions  . Cortisone Other (See Comments)    Headache   . Morphine And Related Other (See  Comments)    headache  . Prednisone Other (See Comments)    Headache   . Sulfa Antibiotics Nausea Only    Family History  Problem Relation Age of Onset  . Breast cancer Sister 76  . Breast cancer Maternal Grandmother 47  . Breast cancer Sister 64    Social History Social History  Substance Use Topics  . Smoking status: Never Smoker  . Smokeless tobacco: Never Used  . Alcohol use No    Review of Systems  Constitutional: Negative for fever. Eyes: Negative for visual changes. ENT: Negative for sore throat. Cardiovascular: Positive for chest pain. Respiratory: Negative for shortness of  breath. Gastrointestinal: Negative for abdominal pain, vomiting and diarrhea. Genitourinary: Negative for dysuria. Musculoskeletal: Positive for mid upper back pain. Skin: Negative for rash. Neurological: Negative for headache. 10 point Review of Systems otherwise negative ____________________________________________   PHYSICAL EXAM:  VITAL SIGNS: ED Triage Vitals  Enc Vitals Group     BP 12/10/16 0914 113/84     Pulse Rate 12/10/16 0914 84     Resp 12/10/16 0914 13     Temp 12/10/16 0914 97.9 F (36.6 C)     Temp Source 12/10/16 0914 Oral     SpO2 12/10/16 0914 95 %     Weight --      Height --      Head Circumference --      Peak Flow --      Pain Score 12/10/16 0912 10     Pain Loc --      Pain Edu? --      Excl. in [place]? --      Constitutional: Alert and oriented. Well appearing and in no distress. HEENT   Head: Normocephalic and atraumatic.      Eyes: Conjunctivae are normal. PERRL. Normal extraocular movements.      Ears:         Nose: No congestion/rhinnorhea.   Mouth/Throat: Mucous membranes are moist.   Neck: No stridor. Cardiovascular/Chest: Normal rate, regular rhythm.  No murmurs, rubs, or gallops. Respiratory: Normal respiratory effort without tachypnea nor retractions. Breath sounds are clear and equal bilaterally. No wheezes/rales/rhonchi. Gastrointestinal: Soft. No distention, no guarding, no rebound. Nontender.    Genitourinary/rectal:Deferred Musculoskeletal: Nontender with normal range of motion in all extremities. No joint effusions.  No lower extremity tenderness.  No edema. Neurologic:  Normal speech and language. No gross or focal neurologic deficits are appreciated. Skin:  Skin is warm, dry and intact. No rash noted. Psychiatric: Mood and affect are normal. Speech and behavior are normal. Patient exhibits appropriate insight and judgment.   ____________________________________________  LABS (pertinent positives/negatives)  Labs  Reviewed  BASIC METABOLIC PANEL - Abnormal; Notable for the following:       Result Value   Sodium 132 (*)    Glucose, Bld 109 (*)    All other components within normal limits  CBC WITH DIFFERENTIAL/PLATELET  TROPONIN I  TROPONIN I    ____________________________________________    EKG I, Lisa Roca, MD, the attending physician have personally viewed and interpreted all ECGs.  88 bpm. Low voltage. Nonspecific intraventricular conduction delay. Left axis deviation. Nonspecific ST and T-wave ____________________________________________  RADIOLOGY All Xrays were viewed by me. Imaging interpreted by Radiologist.  Chest x-ray portable:  No acute abnormality noted.  CT angio chest:  IMPRESSION: No evidence of thoracic aortic aneurysm or dissection. No acute cardiopulmonary abnormality seen. __________________________________________  PROCEDURES  Procedure(s) performed: None  Critical Care performed: None  ____________________________________________  ED COURSE / ASSESSMENT AND PLAN  Pertinent labs & imaging results that were available during my care of the patient were reviewed by me and considered in my medical decision making (see chart for details).    Ms. Dannielle Karvonen is here with severe back pain although it sounds like she is having some mild chest discomfort, although most it was back pain.  She is rating it 9 out of 10. Her initial EKG was reassuring. Her laboratory studies are reassuring. I discussed with her and her daughter obtaining CT chest to rule out dissection.  Chest CT is reassuring for no acute finding to explain patient's pain.  I was able to go ahead and reexamine her and she does have very tender palpable muscle spasm on her left back. She takes 10 mg hydrocodone 3 times daily for chronic back pain. She was due for a dose and I gave her that as well as a dose of Flexeril. We also discussed heat and massage.  Repeat troponin was performed and also  reassuring and negative. Next 5 patient is feeling better after hydrocodone and Flexeril. I will discharge her with Flexeril although we did discuss side effects in terms of sedation, etc.    CONSULTATIONS:   None   Patient / Family / Caregiver informed of clinical course, medical decision-making process, and agree with plan.   I discussed return precautions, follow-up instructions, and discharge instructions with patient and/or family.   ___________________________________________   FINAL CLINICAL IMPRESSION(S) / ED DIAGNOSES   Final diagnoses:  Atypical chest pain  Muscle spasm of back              Note: This dictation was prepared with Dragon dictation. Any transcriptional errors that result from this process are unintentional    Lisa Roca, MD 12/10/16 1334

## 2016-12-10 NOTE — ED Notes (Signed)
Patient transported to CT 

## 2016-12-10 NOTE — ED Notes (Signed)
Assisted to bathroom. NAD. Family at bedside.

## 2016-12-10 NOTE — Discharge Instructions (Signed)
You were evaluated for back pain, and some mild chest discomfort, and your exam and evaluation are reassuring in the emergency department today. As we discussed, I suspect her back pain is coming from muscle spasm. In addition to your chronic pain medication, I am adding muscle relaxer called Flexeril. Be aware that this could cause additional sleepiness or sedation or off balance.  Return to the emergency department immediately for any worsening pain, weakness, numbness, trouble breathing, shortness of breath, chest pain, nausea, sweats, or any other symptoms concerning to you.  I have recommended follow up with your primary care doctor, as well as a cardiologist for the slight chest discomfort although your heart evaluation appears reassuring today in emergency department, I would recommend following up with a cardiologist.

## 2017-01-04 DIAGNOSIS — R0982 Postnasal drip: Secondary | ICD-10-CM | POA: Diagnosis not present

## 2017-01-04 DIAGNOSIS — J3481 Nasal mucositis (ulcerative): Secondary | ICD-10-CM | POA: Diagnosis not present

## 2017-01-04 DIAGNOSIS — J329 Chronic sinusitis, unspecified: Secondary | ICD-10-CM | POA: Diagnosis not present

## 2017-01-04 DIAGNOSIS — I10 Essential (primary) hypertension: Secondary | ICD-10-CM | POA: Diagnosis not present

## 2017-02-17 DIAGNOSIS — M7121 Synovial cyst of popliteal space [Baker], right knee: Secondary | ICD-10-CM | POA: Diagnosis not present

## 2017-02-17 DIAGNOSIS — M25562 Pain in left knee: Secondary | ICD-10-CM | POA: Diagnosis not present

## 2017-02-17 DIAGNOSIS — M7122 Synovial cyst of popliteal space [Baker], left knee: Secondary | ICD-10-CM | POA: Diagnosis not present

## 2017-02-17 DIAGNOSIS — M5416 Radiculopathy, lumbar region: Secondary | ICD-10-CM | POA: Diagnosis not present

## 2017-03-15 DIAGNOSIS — H353134 Nonexudative age-related macular degeneration, bilateral, advanced atrophic with subfoveal involvement: Secondary | ICD-10-CM | POA: Diagnosis not present

## 2017-03-22 DIAGNOSIS — M47816 Spondylosis without myelopathy or radiculopathy, lumbar region: Secondary | ICD-10-CM | POA: Diagnosis not present

## 2017-03-22 DIAGNOSIS — S22000A Wedge compression fracture of unspecified thoracic vertebra, initial encounter for closed fracture: Secondary | ICD-10-CM | POA: Diagnosis not present

## 2017-03-22 DIAGNOSIS — M25861 Other specified joint disorders, right knee: Secondary | ICD-10-CM | POA: Diagnosis not present

## 2017-03-23 ENCOUNTER — Other Ambulatory Visit: Payer: Self-pay | Admitting: Neurosurgery

## 2017-03-23 DIAGNOSIS — S3210XA Unspecified fracture of sacrum, initial encounter for closed fracture: Secondary | ICD-10-CM

## 2017-03-23 DIAGNOSIS — S329XXA Fracture of unspecified parts of lumbosacral spine and pelvis, initial encounter for closed fracture: Secondary | ICD-10-CM

## 2017-03-23 DIAGNOSIS — M47816 Spondylosis without myelopathy or radiculopathy, lumbar region: Secondary | ICD-10-CM

## 2017-03-29 ENCOUNTER — Ambulatory Visit
Admission: RE | Admit: 2017-03-29 | Discharge: 2017-03-29 | Disposition: A | Discharge status: Home / Self Care | Payer: Medicare Other | Source: Ambulatory Visit | Attending: Neurosurgery | Admitting: Neurosurgery

## 2017-03-29 DIAGNOSIS — M2578 Osteophyte, vertebrae: Secondary | ICD-10-CM | POA: Diagnosis not present

## 2017-03-29 DIAGNOSIS — M48061 Spinal stenosis, lumbar region without neurogenic claudication: Secondary | ICD-10-CM | POA: Insufficient documentation

## 2017-03-29 DIAGNOSIS — M47816 Spondylosis without myelopathy or radiculopathy, lumbar region: Secondary | ICD-10-CM | POA: Diagnosis not present

## 2017-03-29 DIAGNOSIS — M4856XA Collapsed vertebra, not elsewhere classified, lumbar region, initial encounter for fracture: Secondary | ICD-10-CM | POA: Insufficient documentation

## 2017-03-29 DIAGNOSIS — Z981 Arthrodesis status: Secondary | ICD-10-CM | POA: Diagnosis not present

## 2017-03-29 DIAGNOSIS — S329XXA Fracture of unspecified parts of lumbosacral spine and pelvis, initial encounter for closed fracture: Secondary | ICD-10-CM

## 2017-03-29 DIAGNOSIS — M4698 Unspecified inflammatory spondylopathy, sacral and sacrococcygeal region: Secondary | ICD-10-CM | POA: Diagnosis not present

## 2017-03-29 DIAGNOSIS — M5126 Other intervertebral disc displacement, lumbar region: Secondary | ICD-10-CM | POA: Diagnosis not present

## 2017-04-06 ENCOUNTER — Ambulatory Visit
Admission: RE | Admit: 2017-04-06 | Discharge: 2017-04-06 | Disposition: A | Discharge status: Home / Self Care | Payer: Medicare Other | Source: Ambulatory Visit | Attending: Nurse Practitioner | Admitting: Nurse Practitioner

## 2017-04-06 ENCOUNTER — Other Ambulatory Visit: Payer: Self-pay | Admitting: Nurse Practitioner

## 2017-04-06 DIAGNOSIS — L03115 Cellulitis of right lower limb: Secondary | ICD-10-CM | POA: Diagnosis not present

## 2017-04-06 DIAGNOSIS — M79661 Pain in right lower leg: Secondary | ICD-10-CM

## 2017-04-06 DIAGNOSIS — M79604 Pain in right leg: Secondary | ICD-10-CM | POA: Insufficient documentation

## 2017-04-06 DIAGNOSIS — M7121 Synovial cyst of popliteal space [Baker], right knee: Secondary | ICD-10-CM | POA: Diagnosis not present

## 2017-04-06 DIAGNOSIS — F411 Generalized anxiety disorder: Secondary | ICD-10-CM | POA: Diagnosis not present

## 2017-04-06 DIAGNOSIS — M7989 Other specified soft tissue disorders: Principal | ICD-10-CM

## 2017-04-06 DIAGNOSIS — I10 Essential (primary) hypertension: Secondary | ICD-10-CM | POA: Diagnosis not present

## 2017-04-06 DIAGNOSIS — I8003 Phlebitis and thrombophlebitis of superficial vessels of lower extremities, bilateral: Secondary | ICD-10-CM | POA: Diagnosis not present

## 2017-04-15 ENCOUNTER — Emergency Department: Payer: Medicare Other

## 2017-04-15 ENCOUNTER — Emergency Department
Admission: EM | Admit: 2017-04-15 | Discharge: 2017-04-15 | Disposition: A | Discharge status: Home / Self Care | Payer: Medicare Other | Attending: Emergency Medicine | Admitting: Emergency Medicine

## 2017-04-15 ENCOUNTER — Encounter: Payer: Self-pay | Admitting: Medical Oncology

## 2017-04-15 DIAGNOSIS — W19XXXA Unspecified fall, initial encounter: Secondary | ICD-10-CM

## 2017-04-15 DIAGNOSIS — S3993XA Unspecified injury of pelvis, initial encounter: Secondary | ICD-10-CM | POA: Diagnosis not present

## 2017-04-15 DIAGNOSIS — K644 Residual hemorrhoidal skin tags: Secondary | ICD-10-CM

## 2017-04-15 DIAGNOSIS — R52 Pain, unspecified: Secondary | ICD-10-CM | POA: Diagnosis present

## 2017-04-15 DIAGNOSIS — K5900 Constipation, unspecified: Secondary | ICD-10-CM | POA: Diagnosis not present

## 2017-04-15 DIAGNOSIS — Z79899 Other long term (current) drug therapy: Secondary | ICD-10-CM | POA: Insufficient documentation

## 2017-04-15 DIAGNOSIS — N390 Urinary tract infection, site not specified: Secondary | ICD-10-CM

## 2017-04-15 DIAGNOSIS — R102 Pelvic and perineal pain: Secondary | ICD-10-CM | POA: Diagnosis not present

## 2017-04-15 LAB — URINALYSIS, COMPLETE (UACMP) WITH MICROSCOPIC
BACTERIA UA: NONE SEEN
Bilirubin Urine: NEGATIVE
Glucose, UA: NEGATIVE mg/dL
Hgb urine dipstick: NEGATIVE
KETONES UR: NEGATIVE mg/dL
Leukocytes, UA: NEGATIVE
Nitrite: POSITIVE — AB
PROTEIN: NEGATIVE mg/dL
Specific Gravity, Urine: 1.004 — ABNORMAL LOW (ref 1.005–1.030)
pH: 7 (ref 5.0–8.0)

## 2017-04-15 LAB — CBC WITH DIFFERENTIAL/PLATELET
Basophils Absolute: 0.1 10*3/uL (ref 0–0.1)
Basophils Relative: 1 %
EOS ABS: 0.1 10*3/uL (ref 0–0.7)
EOS PCT: 1 %
HCT: 40.8 % (ref 35.0–47.0)
HEMOGLOBIN: 13.8 g/dL (ref 12.0–16.0)
LYMPHS ABS: 2 10*3/uL (ref 1.0–3.6)
LYMPHS PCT: 26 %
MCH: 28.9 pg (ref 26.0–34.0)
MCHC: 34 g/dL (ref 32.0–36.0)
MCV: 85.1 fL (ref 80.0–100.0)
MONOS PCT: 9 %
Monocytes Absolute: 0.7 10*3/uL (ref 0.2–0.9)
Neutro Abs: 4.8 10*3/uL (ref 1.4–6.5)
Neutrophils Relative %: 63 %
Platelets: 212 10*3/uL (ref 150–440)
RBC: 4.79 MIL/uL (ref 3.80–5.20)
RDW: 13.4 % (ref 11.5–14.5)
WBC: 7.7 10*3/uL (ref 3.6–11.0)

## 2017-04-15 LAB — COMPREHENSIVE METABOLIC PANEL
ALBUMIN: 4.5 g/dL (ref 3.5–5.0)
ALT: 17 U/L (ref 14–54)
AST: 26 U/L (ref 15–41)
Alkaline Phosphatase: 66 U/L (ref 38–126)
Anion gap: 9 (ref 5–15)
BUN: 12 mg/dL (ref 6–20)
CHLORIDE: 99 mmol/L — AB (ref 101–111)
CO2: 24 mmol/L (ref 22–32)
Calcium: 10.4 mg/dL — ABNORMAL HIGH (ref 8.9–10.3)
Creatinine, Ser: 0.59 mg/dL (ref 0.44–1.00)
GFR calc Af Amer: >60 mL/min (ref >60)
GFR calc non Af Amer: >60 mL/min (ref >60)
Glucose, Bld: 110 mg/dL — ABNORMAL HIGH (ref 65–99)
POTASSIUM: 4.1 mmol/L (ref 3.5–5.1)
SODIUM: 132 mmol/L — AB (ref 135–145)
Total Bilirubin: 1.1 mg/dL (ref 0.3–1.2)
Total Protein: 7.4 g/dL (ref 6.5–8.1)

## 2017-04-15 MED ORDER — SODIUM CHLORIDE 0.9 % IV BOLUS (SEPSIS)
1000.0000 mL | Freq: Once | INTRAVENOUS | Status: AC
  Administered 2017-04-15: 1000 mL via INTRAVENOUS

## 2017-04-15 MED ORDER — CIPROFLOXACIN HCL 500 MG PO TABS: 500.0000 mg | ORAL_TABLET | Freq: Two times a day (BID) | ORAL | 0 refills | Status: DC

## 2017-04-15 NOTE — ED Provider Notes (Signed)
[facility] Emergency Department Provider Note  ____________________________________________  Time seen: Approximately 1:11 PM  I have reviewed the triage vital signs and the nursing notes.   HISTORY  Chief Complaint Fall    HPI [name] is a 83 y.o. female who is brought to the walk-in clinic by her family due to a mechanical fall over a week ago after losing her balance and falling on her buttocks. She is able to get up on her own and was seated in a chair by the time her daughters had arrived to check on her. She's been ambulatory since then. She did not have any acute change in her mental status or function and denies any head injury. She eats poorly at baseline, primarily just ice cream sandwiches and cookies. The last 2 days she's been complaining of rectal pain as well which feels like her hemorrhoids, but she is not sure if it could be related to her fall. Also complains of suprapubic pain and difficulty urinating.   Past Medical History:  Diagnosis Date  . Anxiety   . Chronic back pain   . High cholesterol   . History of urinary retention   . Hypertension   . Pneumonia      Patient Active Problem List   Diagnosis Date Noted  . Hyponatremia 12/15/2015     Past Surgical History:  Procedure Laterality Date  . ABDOMINAL HYSTERECTOMY    . BACK SURGERY    . BREAST BIOPSY Right 2002   neg     Prior to Admission medications   Medication Sig Start Date End Date Taking? Authorizing Provider  amLODipine (NORVASC) 5 MG tablet Take 5 mg by mouth daily.   Yes [provider]  Calcium 600-200 MG-UNIT tablet Take 1 tablet by mouth daily.   Yes [provider]  carvedilol (COREG) 6.25 MG tablet Take 6.25 mg by mouth 2 (two) times daily with a meal.   Yes [provider]  HYDROcodone-acetaminophen (NORCO) 10-325 MG tablet Take 1 tablet by mouth 4 (four) times daily as needed.    Yes [provider]  LORazepam  (ATIVAN) 0.5 MG tablet Take 0.5 mg by mouth 2 (two) times daily.    Yes [provider]  mirtazapine (REMERON) 30 MG tablet Take 30 mg by mouth at bedtime.   Yes [provider]  Multiple Vitamin (MULTIVITAMIN) tablet Take 1 tablet by mouth daily.   Yes [provider]  cefUROXime (CEFTIN) 500 MG tablet Take 1 tablet (500 mg total) by mouth 2 (two) times daily with a meal. Patient not taking: Reported on 12/10/2016 12/19/15   Bettey Costa, MD  ciprofloxacin (CIPRO) 500 MG tablet Take 1 tablet (500 mg total) by mouth 2 (two) times daily. 04/15/17   Carrie Mew, MD  cyclobenzaprine (FLEXERIL) 10 MG tablet Take 1 tablet (10 mg total) by mouth 3 (three) times daily as needed for muscle spasms. Patient not taking: Reported on 04/15/2017 12/10/16   Lisa Roca, MD  meloxicam (MOBIC) 7.5 MG tablet Take 7.5 mg by mouth daily.    [provider]  sodium chloride 1 g tablet Take 1 tablet (1 g total) by mouth 2 (two) times daily with a meal. Patient not taking: Reported on 12/10/2016 12/19/15   Bettey Costa, MD  tamsulosin (FLOMAX) 0.4 MG CAPS capsule Take 1 capsule (0.4 mg total) by mouth daily. Patient not taking: Reported on 12/10/2016 12/19/15   Bettey Costa, MD  traMADol-acetaminophen (ULTRACET) 37.5-325 MG tablet Take  1 tablet by mouth every 8 (eight) hours as needed for moderate pain. Patient not taking: Reported on 12/10/2016 08/12/15   Sable Feil, PA-C     Allergies Cortisone; Morphine and related; Prednisone; and Sulfa antibiotics   Family History  Problem Relation Age of Onset  . Breast cancer Sister 94  . Breast cancer Maternal Grandmother 45  . Breast cancer Sister 38    Social History Social History  Substance Use Topics  . Smoking status: Never Smoker  . Smokeless tobacco: Never Used  . Alcohol use No    Review of Systems  Constitutional:   No fever or chills.  ENT:   No sore throat. No rhinorrhea. Cardiovascular:   No chest pain or  syncope. Respiratory:   No dyspnea or cough. Gastrointestinal:   Positive suprapubic pain without vomiting or diarrhea. Positive rectal pain Musculoskeletal:   Negative for focal pain or swelling All other systems reviewed and are negative except as documented above in ROS and HPI.  ____________________________________________   PHYSICAL EXAM:  VITAL SIGNS: ED Triage Vitals  Enc Vitals Group     BP 04/15/17 1009 117/83     Pulse Rate 04/15/17 1009 96     Resp 04/15/17 1009 20     Temp 04/15/17 1009 98.4 F (36.9 C)     Temp Source 04/15/17 1009 Oral     SpO2 04/15/17 1009 100 %     Weight 04/15/17 1009 132 lb (59.9 kg)     Height 04/15/17 1009 5' (1.524 m)     Head Circumference --      Peak Flow --      Pain Score 04/15/17 1008 8     Pain Loc --      Pain Edu? --      Excl. in [place]? --     Vital signs reviewed, nursing assessments reviewed.   Constitutional:   Alert and oriented. Well appearing and in no distress. Eyes:   No scleral icterus.  EOMI. No nystagmus. No conjunctival pallor. PERRL. ENT   Head:   Normocephalic and atraumatic.   Nose:   No congestion/rhinnorhea.    Mouth/Throat:   MMM, no pharyngeal erythema. No peritonsillar mass.    Neck:   No meningismus. Full ROM Hematological/Lymphatic/Immunilogical:   No cervical lymphadenopathy. Cardiovascular:   RRR. Symmetric bilateral radial and DP pulses.  No murmurs.  Respiratory:   Normal respiratory effort without tachypnea/retractions. Breath sounds are clear and equal bilaterally. No wheezes/rales/rhonchi. Gastrointestinal:   Soft with suprapubic tenderness. Non distended. There is no CVA tenderness.  No rebound, rigidity, or guarding. Rectal exam performed with nurse at bedside, shows external hemorrhoids which are tender to the touch reproducing her symptoms, but not grossly thrombosed inflamed or bleeding. Genitourinary:   deferred Musculoskeletal:   Normal range of motion in all extremities. No  joint effusions.  No lower extremity tenderness.  No edema. No midline spinal tenderness Neurologic:   Normal speech and language.  Motor grossly intact. No gross focal neurologic deficits are appreciated.  Skin:    Skin is warm, dry and intact. No rash noted.  No petechiae, purpura, or bullae.  ____________________________________________    LABS (pertinent positives/negatives) (all labs ordered are listed, but only abnormal results are displayed) Labs Reviewed  URINALYSIS, COMPLETE (UACMP) WITH MICROSCOPIC - Abnormal; Notable for the following:       Result Value   Color, Urine AMBER (*)    APPearance CLEAR (*)    Specific Gravity, Urine 1.004 (*)  Nitrite POSITIVE (*)    Squamous Epithelial / LPF 0-5 (*)    All other components within normal limits  COMPREHENSIVE METABOLIC PANEL - Abnormal; Notable for the following:    Sodium 132 (*)    Chloride 99 (*)    Glucose, Bld 110 (*)    Calcium 10.4 (*)    All other components within normal limits  URINE CULTURE  CBC WITH DIFFERENTIAL/PLATELET   ____________________________________________   EKG    ____________________________________________    RADIOLOGY  Dg Pelvis 1-2 Views  Result Date: 04/15/2017 CLINICAL DATA:  [place] backwards one week ago onto a brick hearth, LEFT hip pain, vaginal pain, constipation, sacral pain post fall EXAM: PELVIS - 1-2 VIEW COMPARISON:  CT pelvis 03/29/2017 FINDINGS: Prior L4-S1 fusion. Diffuse osseous demineralization. Hip and SI joint spaces preserved. Sacral foramina grossly symmetric. No acute fracture, dislocation, or bone destruction. IMPRESSION: No acute osseous abnormalities. Osseous demineralization with prior lumbosacral fusion. Electronically Signed   By: Lavonia Dana M.D.   On: 04/15/2017 11:20   Dg Abdomen Acute W/chest  Result Date: 04/15/2017 CLINICAL DATA:  Constipation and difficulty urinating since Wednesday, fell backwards onto a brick hearth 1 week ago, sacral pain EXAM: DG  ABDOMEN ACUTE W/ 1V CHEST COMPARISON:  Abdominal radiographs 10/01/2016, chest radiograph 12/10/2016 FINDINGS: Enlargement of cardiac silhouette. Tortuous thoracic aorta. Mediastinal contours and pulmonary vascularity otherwise normal. Emphysematous changes with minimal RIGHT basilar atelectasis. No acute infiltrate, pleural effusion or pneumothorax. Nonobstructive bowel gas pattern. No bowel dilatation, bowel wall thickening, or free air. Numerous pelvic phleboliths without definite urinary tract calcification. Osseous demineralization with prior L3-S1 fusion. IMPRESSION: No acute abdominal abnormalities. Enlargement of cardiac silhouette. Emphysematous changes with minimal RIGHT basilar atelectasis. Emphysema (ICD10-J43.9). Electronically Signed   By: Lavonia Dana M.D.   On: 04/15/2017 11:22    ____________________________________________   PROCEDURES Procedures  ____________________________________________   INITIAL IMPRESSION / ASSESSMENT AND PLAN / ED COURSE  Pertinent labs & imaging results that were available during my care of the patient were reviewed by me and considered in my medical decision making (see chart for details).  Patient well appearing no acute distress, presents after a fall, with delayed symptoms afterward. No significant traumatic injury, and x-rays are negative. She is nitrite positive indicative of a UTI. She reports previously not being a tolerate Macrobid and Keflex, and with her sulfa allergy I will start her on Cipro. Urine culture sent. She does have a normal GFR. Labs otherwise reassuring. Patient has been functioning as usual, eating at her baseline, suitable for outpatient follow-up with supervision by her daughters. No evidence of significant injury from the fall.      ____________________________________________   FINAL CLINICAL IMPRESSION(S) / ED DIAGNOSES  Final diagnoses:  Fall, initial encounter  Lower urinary tract infectious disease  External  hemorrhoid      New Prescriptions   CIPROFLOXACIN (CIPRO) 500 MG TABLET    Take 1 tablet (500 mg total) by mouth 2 (two) times daily.     Portions of this note were generated with dragon dictation software. Dictation errors may occur despite best attempts at proofreading.    Carrie Mew, MD 04/15/17 1316

## 2017-04-15 NOTE — ED Triage Notes (Signed)
Pt reports that she fell backwards last Wednesday by losing her balance. Pt states that she has been having pain to rectum but that started 2 days ago. Pt denies other complaints. Pt A/O x 4 did not hit her head, only her sacrum.

## 2017-04-15 NOTE — ED Notes (Signed)
Patient transported to X-ray 

## 2017-04-15 NOTE — ED Notes (Signed)
Pt returned from xray, family at bedside

## 2017-04-15 NOTE — ED Notes (Signed)
Pt had mechanical fall last Wednesday.  Neurologist took fentanyl patch off earlier that day because of right leg swelling.  They did do [place] and r/o DVT and treat possible cellulitis.  No leg complaints at this time.  Has been walking as usual at home.  Pain on exam is at hemorrhoid site.

## 2017-04-15 NOTE — ED Notes (Signed)
Pt assisted to use bathroom, helped back to bed and covered with warm blankets, family at bedside

## 2017-04-16 LAB — URINE CULTURE: Culture: NO GROWTH

## 2017-05-02 ENCOUNTER — Other Ambulatory Visit: Payer: Self-pay

## 2017-05-19 DIAGNOSIS — R300 Dysuria: Secondary | ICD-10-CM | POA: Diagnosis not present

## 2017-05-30 DIAGNOSIS — T8069XS Other serum reaction due to other serum, sequela: Secondary | ICD-10-CM | POA: Diagnosis not present

## 2017-05-30 DIAGNOSIS — F411 Generalized anxiety disorder: Secondary | ICD-10-CM | POA: Diagnosis not present

## 2017-05-30 DIAGNOSIS — I10 Essential (primary) hypertension: Secondary | ICD-10-CM | POA: Diagnosis not present

## 2017-05-30 DIAGNOSIS — D509 Iron deficiency anemia, unspecified: Secondary | ICD-10-CM | POA: Diagnosis not present

## 2017-05-30 DIAGNOSIS — N390 Urinary tract infection, site not specified: Secondary | ICD-10-CM | POA: Diagnosis not present

## 2017-05-30 DIAGNOSIS — E039 Hypothyroidism, unspecified: Secondary | ICD-10-CM | POA: Diagnosis not present

## 2017-05-30 DIAGNOSIS — R5381 Other malaise: Secondary | ICD-10-CM | POA: Diagnosis not present

## 2017-05-30 DIAGNOSIS — E538 Deficiency of other specified B group vitamins: Secondary | ICD-10-CM | POA: Diagnosis not present

## 2017-05-30 DIAGNOSIS — D6489 Other specified anemias: Secondary | ICD-10-CM | POA: Diagnosis not present

## 2017-05-30 DIAGNOSIS — K648 Other hemorrhoids: Secondary | ICD-10-CM | POA: Diagnosis not present

## 2017-05-31 ENCOUNTER — Ambulatory Visit: Payer: Self-pay | Admitting: Urology

## 2017-06-08 DIAGNOSIS — K5900 Constipation, unspecified: Secondary | ICD-10-CM | POA: Diagnosis not present

## 2017-06-08 DIAGNOSIS — K648 Other hemorrhoids: Secondary | ICD-10-CM | POA: Diagnosis not present

## 2017-06-08 DIAGNOSIS — F411 Generalized anxiety disorder: Secondary | ICD-10-CM | POA: Diagnosis not present

## 2017-06-08 DIAGNOSIS — I10 Essential (primary) hypertension: Secondary | ICD-10-CM | POA: Diagnosis not present

## 2017-06-08 DIAGNOSIS — D696 Thrombocytopenia, unspecified: Secondary | ICD-10-CM | POA: Diagnosis not present

## 2017-06-10 DIAGNOSIS — D509 Iron deficiency anemia, unspecified: Secondary | ICD-10-CM | POA: Diagnosis not present

## 2017-06-15 ENCOUNTER — Emergency Department: Payer: Medicare Other

## 2017-06-15 ENCOUNTER — Encounter: Payer: Self-pay | Admitting: *Deleted

## 2017-06-15 ENCOUNTER — Emergency Department
Admission: EM | Admit: 2017-06-15 | Discharge: 2017-06-15 | Disposition: A | Discharge status: Home / Self Care | Payer: Medicare Other | Attending: Emergency Medicine | Admitting: Emergency Medicine

## 2017-06-15 DIAGNOSIS — R339 Retention of urine, unspecified: Secondary | ICD-10-CM | POA: Diagnosis not present

## 2017-06-15 DIAGNOSIS — R109 Unspecified abdominal pain: Secondary | ICD-10-CM | POA: Diagnosis not present

## 2017-06-15 DIAGNOSIS — Z79899 Other long term (current) drug therapy: Secondary | ICD-10-CM | POA: Insufficient documentation

## 2017-06-15 DIAGNOSIS — I10 Essential (primary) hypertension: Secondary | ICD-10-CM | POA: Diagnosis not present

## 2017-06-15 DIAGNOSIS — N3090 Cystitis, unspecified without hematuria: Secondary | ICD-10-CM | POA: Diagnosis not present

## 2017-06-15 DIAGNOSIS — Z79891 Long term (current) use of opiate analgesic: Secondary | ICD-10-CM | POA: Insufficient documentation

## 2017-06-15 DIAGNOSIS — K6289 Other specified diseases of anus and rectum: Secondary | ICD-10-CM | POA: Diagnosis present

## 2017-06-15 DIAGNOSIS — N3000 Acute cystitis without hematuria: Secondary | ICD-10-CM | POA: Diagnosis not present

## 2017-06-15 DIAGNOSIS — N2889 Other specified disorders of kidney and ureter: Secondary | ICD-10-CM | POA: Insufficient documentation

## 2017-06-15 LAB — COMPREHENSIVE METABOLIC PANEL
ALK PHOS: 75 U/L (ref 38–126)
ALT: 14 U/L (ref 14–54)
AST: 23 U/L (ref 15–41)
Albumin: 4.3 g/dL (ref 3.5–5.0)
Anion gap: 8 (ref 5–15)
BILIRUBIN TOTAL: 0.8 mg/dL (ref 0.3–1.2)
BUN: 15 mg/dL (ref 6–20)
CALCIUM: 10 mg/dL (ref 8.9–10.3)
CO2: 25 mmol/L (ref 22–32)
CREATININE: 0.63 mg/dL (ref 0.44–1.00)
Chloride: 97 mmol/L — ABNORMAL LOW (ref 101–111)
Glucose, Bld: 104 mg/dL — ABNORMAL HIGH (ref 65–99)
Potassium: 4.2 mmol/L (ref 3.5–5.1)
Sodium: 130 mmol/L — ABNORMAL LOW (ref 135–145)
TOTAL PROTEIN: 7.3 g/dL (ref 6.5–8.1)

## 2017-06-15 LAB — CBC WITH DIFFERENTIAL/PLATELET
Basophils Absolute: 0.1 10*3/uL (ref 0–0.1)
Basophils Relative: 1 %
Eosinophils Absolute: 0.3 10*3/uL (ref 0–0.7)
Eosinophils Relative: 3 %
HEMATOCRIT: 39.6 % (ref 35.0–47.0)
HEMOGLOBIN: 13.4 g/dL (ref 12.0–16.0)
LYMPHS ABS: 2 10*3/uL (ref 1.0–3.6)
LYMPHS PCT: 25 %
MCH: 28.9 pg (ref 26.0–34.0)
MCHC: 34 g/dL (ref 32.0–36.0)
MCV: 85 fL (ref 80.0–100.0)
Monocytes Absolute: 0.8 10*3/uL (ref 0.2–0.9)
Monocytes Relative: 10 %
NEUTROS PCT: 61 %
Neutro Abs: 4.9 10*3/uL (ref 1.4–6.5)
Platelets: 205 10*3/uL (ref 150–440)
RBC: 4.66 MIL/uL (ref 3.80–5.20)
RDW: 13.7 % (ref 11.5–14.5)
WBC: 8.1 10*3/uL (ref 3.6–11.0)

## 2017-06-15 LAB — URINALYSIS, COMPLETE (UACMP) WITH MICROSCOPIC
BILIRUBIN URINE: NEGATIVE
Glucose, UA: NEGATIVE mg/dL
HGB URINE DIPSTICK: NEGATIVE
Ketones, ur: NEGATIVE mg/dL
Leukocytes, UA: NEGATIVE
NITRITE: NEGATIVE
PROTEIN: NEGATIVE mg/dL
SPECIFIC GRAVITY, URINE: 1.004 — AB (ref 1.005–1.030)
pH: 7 (ref 5.0–8.0)

## 2017-06-15 MED ORDER — CEPHALEXIN 500 MG PO CAPS
500.0000 mg | ORAL_CAPSULE | Freq: Once | ORAL | Status: AC
  Administered 2017-06-15: 500 mg via ORAL
  Filled 2017-06-15: qty 1

## 2017-06-15 MED ORDER — IOPAMIDOL (ISOVUE-300) INJECTION 61%
85.0000 mL | Freq: Once | INTRAVENOUS | Status: AC | PRN
  Administered 2017-06-15: 85 mL via INTRAVENOUS

## 2017-06-15 MED ORDER — CEPHALEXIN 500 MG PO CAPS: 500.0000 mg | ORAL_CAPSULE | Freq: Three times a day (TID) | ORAL | 0 refills | Status: DC

## 2017-06-15 MED ORDER — HYDROCODONE-ACETAMINOPHEN 10-325 MG PO TABS
1.0000 | ORAL_TABLET | Freq: Once | ORAL | Status: AC
  Administered 2017-06-15: 1 via ORAL
  Filled 2017-06-15: qty 1

## 2017-06-15 NOTE — Discharge Instructions (Signed)
please follow up with your urologist for further evaluation of your urinary retention and the mass seen in your left kidney. Take antibiotics as prescribed. Follow the below instructions for the Foley catheter. Return to the emergency room for fever, abdominal pain, flank pain, nausea vomiting, or any new symptoms concerning to you.  Catheter care  Always wash your hands before and after touching your catheter.  Check the area around the urethra for inflammation or signs of infection. Signs of infection include irritated, swollen, red, or tender skin, or pus around the catheter.  Clean the area around the catheter with soap and water two times a day. Dry with a clean towel afterward.  Do not apply powder or lotion to the skin around the catheter.  To empty the urine collection bag  Wash your hands with soap and water.  Without touching the drain spout, remove the spout from its sleeve at the bottom of the collection bag. Open the valve on the spout.  Let the urine flow out of the bag and into the toilet or a container. Do not let the tubing or drain spout touch anything.  After you empty the bag, clean the end of the drain spout with tissue and water. Close the valve and put the drain spout back into its sleeve at the bottom of the collection bag.  Wash your hands with soap and water.

## 2017-06-15 NOTE — ED Notes (Signed)
Pt and daughter give leg bag and both instructed on how to empty the bag

## 2017-06-15 NOTE — ED Provider Notes (Signed)
[facility] Emergency Department Provider Note  ____________________________________________  Time seen: Approximately 8:33 AM  I have reviewed the triage vital signs and the nursing notes.   HISTORY  Chief Complaint Abdominal Pain   HPI [name] is a 83 y.o. female with a history of chronic back pain on chronic opiates, urinary retention, hypertension, hyperlipidemia who presents for evaluation of rectal pain and pressure. Patient reports for the last several weeks she wakes up in the morningwith cold sweat and has rectal pressure like she needs to go have a bowel movement. She'll sit on the toilet and nothing will come out. She denies abdominal pain, nausea or vomiting, dysuria hematuria. Patient does have a history of chronic constipation because of her chronic opiates. She reports that on Monday she took MiraLAX and Colace for constipation and yesterday had several large episodes of diarrhea. This morning she reports that she woke up again with cold sweats and her pain was severe located in the rectal area constant and non radiating. She reports that she had a very thick bowel movement. No blood or melena. no personal or family history of colon cancer. No h/o smoking   Past Medical History:  Diagnosis Date  . Anxiety   . Chronic back pain   . High cholesterol   . History of urinary retention   . Hypertension   . Pneumonia     Patient Active Problem List   Diagnosis Date Noted  . Hyponatremia 12/15/2015    Past Surgical History:  Procedure Laterality Date  . ABDOMINAL HYSTERECTOMY    . BACK SURGERY    . BREAST BIOPSY Right 2002   neg    Prior to Admission medications   Medication Sig Start Date End Date Taking? Authorizing Provider  amLODipine (NORVASC) 5 MG tablet Take 5 mg by mouth daily.   Yes [provider]  Calcium 600-200 MG-UNIT tablet Take 1 tablet by mouth daily.   Yes [provider]  carvedilol (COREG)  6.25 MG tablet Take 6.25 mg by mouth 2 (two) times daily with a meal.   Yes [provider]  escitalopram (LEXAPRO) 5 MG tablet Take 5 mg by mouth daily. 06/08/17  Yes [provider]  HYDROcodone-acetaminophen (NORCO) 10-325 MG tablet Take 1 tablet by mouth 3 (three) times daily.    Yes [provider]  LORazepam (ATIVAN) 0.5 MG tablet Take 0.5 mg by mouth 2 (two) times daily.    Yes [provider]  meloxicam (MOBIC) 7.5 MG tablet Take 7.5 mg by mouth daily as needed for pain.    Yes [provider]  mirtazapine (REMERON) 30 MG tablet Take 30 mg by mouth at bedtime.   Yes [provider]  Multiple Vitamin (MULTIVITAMIN) tablet Take 1 tablet by mouth daily.   Yes [provider]  Multiple Vitamins-Minerals (PRESERVISION AREDS 2) CAPS Take 1 capsule by mouth 2 (two) times daily.   Yes [provider]  cephALEXin (KEFLEX) 500 MG capsule Take 1 capsule (500 mg total) by mouth 3 (three) times daily. 06/15/17 06/22/17  Rudene Re, MD    Allergies Cortisone; Morphine and related; Prednisone; and Sulfa antibiotics  Family History  Problem Relation Age of Onset  . Breast cancer Sister 96  . Breast cancer Maternal Grandmother 74  . Breast cancer Sister 53    Social History Social History  Substance Use Topics  . Smoking status: Never Smoker  . Smokeless tobacco: Never Used  . Alcohol use No  Review of Systems  Constitutional: Negative for fever. Eyes: Negative for visual changes. ENT: Negative for sore throat. Neck: No neck pain  Cardiovascular: Negative for chest pain. Respiratory: Negative for shortness of breath. Gastrointestinal: Negative for abdominal pain, vomiting or diarrhea. Genitourinary: Negative for dysuria. + rectal pressure Musculoskeletal: Negative for back pain. Skin: Negative for rash. Neurological: Negative for headaches, weakness or numbness. Psych: No SI or  HI  ____________________________________________   PHYSICAL EXAM:  VITAL SIGNS: ED Triage Vitals  Enc Vitals Group     BP 06/15/17 0754 140/87     Pulse Rate 06/15/17 0754 83     Resp 06/15/17 0754 16     Temp 06/15/17 0754 97.9 F (36.6 C)     Temp Source 06/15/17 0754 Oral     SpO2 06/15/17 0754 96 %     Weight 06/15/17 0753 124 lb (56.2 kg)     Height 06/15/17 0753 5' (1.524 m)     Head Circumference --      Peak Flow --      Pain Score 06/15/17 0752 10     Pain Loc --      Pain Edu? --      Excl. in [place]? --     Constitutional: Alert and oriented. Well appearing and in no apparent distress. HEENT:      Head: Normocephalic and atraumatic.         Eyes: Conjunctivae are normal. Sclera is non-icteric.       Mouth/Throat: Mucous membranes are moist.       Neck: Supple with no signs of meningismus. Cardiovascular: Regular rate and rhythm. No murmurs, gallops, or rubs. 2+ symmetrical distal pulses are present in all extremities. No JVD. Respiratory: Normal respiratory effort. Lungs are clear to auscultation bilaterally. No wheezes, crackles, or rhonchi.  Gastrointestinal: Soft, non tender, and non distended with positive bowel sounds. No rebound or guarding. Genitourinary: No CVA tenderness. rectal exam showing no masses, no hemorrhoids, no fissure Musculoskeletal: Nontender with normal range of motion in all extremities. No edema, cyanosis, or erythema of extremities. Neurologic: Normal speech and language. Face is symmetric. Moving all extremities. No gross focal neurologic deficits are appreciated. Skin: Skin is warm, dry and intact. No rash noted. Psychiatric: Mood and affect are normal. Speech and behavior are normal.  ____________________________________________   LABS (all labs ordered are listed, but only abnormal results are displayed)  Labs Reviewed  COMPREHENSIVE METABOLIC PANEL - Abnormal; Notable for the following:       Result Value   Sodium 130 (*)     Chloride 97 (*)    Glucose, Bld 104 (*)    All other components within normal limits  URINALYSIS, COMPLETE (UACMP) WITH MICROSCOPIC - Abnormal; Notable for the following:    Color, Urine STRAW (*)    APPearance CLEAR (*)    Specific Gravity, Urine 1.004 (*)    Bacteria, UA RARE (*)    Squamous Epithelial / LPF 0-5 (*)    All other components within normal limits  URINE CULTURE  CBC WITH DIFFERENTIAL/PLATELET   ____________________________________________  EKG  none  ____________________________________________  RADIOLOGY  CT a/p:  1. Distended urinary bladder extending to the tops of the iliac crests. This may explain the rectal pressure that the patient is feeling. 2. Interval 1.1 cm mid left renal mass. This is concerning for a primary renal neoplasm. An elective off of pre and postcontrast magnetic resonance imaging examination of the kidneys is recommended. ____________________________________________   PROCEDURES  Procedure(s) performed: None Procedures Critical Care performed:  None ____________________________________________   INITIAL IMPRESSION / ASSESSMENT AND PLAN / ED COURSE   83 y.o. female with a history of chronic back pain on chronic opiates, urinary retention, hypertension, hyperlipidemia who presents for evaluation of rectal pain and pressure for a few weeks in the morning. rectal exam is normal with no palpable masses, no hemorrhoids, no fissure. Abdomen is soft with no tenderness throughout. I explained to the patient that she needs to be on a daily bowel regimen due to her chronic opiates to prevent these episodes of constipation and diarrhea. I do believe the patient needs a colonoscopy to rule out a mass. I will send patient for CT abdomen and pelvis to evaluate for any abnormalities. Blood work is pending.    _________________________ 12:14 PM on 06/15/2017 -----------------------------------------  CT was concerning for distended bladder. Post  void residual volume of greater than 500 cc's. Foley catheter was placed. I discussed with patient and her daughter the finding of a left renal mass concerning for cancer. Patient has established care with urology. She will follow-up with them for Foley catheter removal, trial of void, and further evaluation of this renal mass. Urinalysis showing bacteria therefore patient was started on Keflex.  Pertinent labs & imaging results that were available during my care of the patient were reviewed by me and considered in my medical decision making (see chart for details).    ____________________________________________   FINAL CLINICAL IMPRESSION(S) / ED DIAGNOSES  Final diagnoses:  Urinary retention  Acute cystitis without hematuria  Renal mass      NEW MEDICATIONS STARTED DURING THIS VISIT:  New Prescriptions   CEPHALEXIN (KEFLEX) 500 MG CAPSULE    Take 1 capsule (500 mg total) by mouth 3 (three) times daily.     Note:  This document was prepared using Dragon voice recognition software and may include unintentional dictation errors.    Rudene Re, MD 06/15/17 [phone]

## 2017-06-15 NOTE — ED Triage Notes (Signed)
States for 2 months she has been waking up in a cold sweat, states rectal pressure and feeling as if she has to have a BM but nothing happens, states lower abd pain, states she was seen at PCP and was told she had low platelets, awake and alert

## 2017-06-15 NOTE — ED Notes (Signed)
Patient assisted to toilet with stand-by assistance. Repositioned in bed and given warm blanket.

## 2017-06-15 NOTE — ED Notes (Signed)
Paper copy of discharge signed by daughter

## 2017-06-16 LAB — URINE CULTURE: Culture: <10000 — AB

## 2017-06-17 ENCOUNTER — Emergency Department
Admission: EM | Admit: 2017-06-17 | Discharge: 2017-06-17 | Disposition: A | Discharge status: Home / Self Care | Payer: Medicare Other | Attending: Emergency Medicine | Admitting: Emergency Medicine

## 2017-06-17 ENCOUNTER — Emergency Department: Payer: Medicare Other

## 2017-06-17 DIAGNOSIS — R339 Retention of urine, unspecified: Secondary | ICD-10-CM | POA: Diagnosis not present

## 2017-06-17 DIAGNOSIS — Z79899 Other long term (current) drug therapy: Secondary | ICD-10-CM | POA: Insufficient documentation

## 2017-06-17 DIAGNOSIS — I10 Essential (primary) hypertension: Secondary | ICD-10-CM | POA: Diagnosis not present

## 2017-06-17 DIAGNOSIS — S3993XA Unspecified injury of pelvis, initial encounter: Secondary | ICD-10-CM | POA: Diagnosis not present

## 2017-06-17 NOTE — ED Triage Notes (Signed)
Pt had catheter placed wed for urinary retention, daughter states "it fell out" wed night. Here to have it placed back in for urinary retention.

## 2017-06-17 NOTE — ED Notes (Signed)

## 2017-06-17 NOTE — ED Provider Notes (Signed)
[facility] Emergency Department Provider Note   First MD Initiated Contact with Patient 06/17/17 [phone]     (approximate)  I have reviewed the triage vital signs and the nursing notes.   HISTORY  Chief Complaint Urinary Retention    HPI [name] is a 83 y.o. female with below list of chronic medical conditions including urinary retention recent catheter placement on 06/15/2017 returns to the emergency department stating that a catheter that was placed on Wednesday fell out. Patient admits to urge to urinate but inability to do so just very small "dribbling". Patient denies any fever no back pain.   Past Medical History:  Diagnosis Date  . Anxiety   . Chronic back pain   . High cholesterol   . History of urinary retention   . Hypertension   . Pneumonia     Patient Active Problem List   Diagnosis Date Noted  . Hyponatremia 12/15/2015    Past Surgical History:  Procedure Laterality Date  . ABDOMINAL HYSTERECTOMY    . BACK SURGERY    . BREAST BIOPSY Right 2002   neg    Prior to Admission medications   Medication Sig Start Date End Date Taking? Authorizing Provider  amLODipine (NORVASC) 5 MG tablet Take 5 mg by mouth daily.   Yes [provider]  Calcium 600-200 MG-UNIT tablet Take 1 tablet by mouth daily.   Yes [provider]  carvedilol (COREG) 6.25 MG tablet Take 6.25 mg by mouth 2 (two) times daily with a meal.   Yes [provider]  cephALEXin (KEFLEX) 500 MG capsule Take 1 capsule (500 mg total) by mouth 3 (three) times daily. 06/15/17 06/22/17 Yes [place], [place], MD  escitalopram (LEXAPRO) 5 MG tablet Take 5 mg by mouth daily. 06/08/17  Yes [provider]  HYDROcodone-acetaminophen (NORCO) 10-325 MG tablet Take 1 tablet by mouth 3 (three) times daily.    Yes [provider]  LORazepam (ATIVAN) 0.5 MG tablet Take 0.5 mg by mouth 2 (two) times daily.    Yes [provider]    meloxicam (MOBIC) 7.5 MG tablet Take 7.5 mg by mouth daily as needed for pain.    Yes [provider]  mirtazapine (REMERON) 30 MG tablet Take 30 mg by mouth at bedtime.   Yes [provider]  Multiple Vitamin (MULTIVITAMIN) tablet Take 1 tablet by mouth daily.   Yes [provider]  Multiple Vitamins-Minerals (PRESERVISION AREDS 2) CAPS Take 1 capsule by mouth 2 (two) times daily.   Yes [provider]    Allergies Cortisone; Morphine and related; Prednisone; and Sulfa antibiotics  Family History  Problem Relation Age of Onset  . Breast cancer Sister 97  . Breast cancer Maternal Grandmother 3  . Breast cancer Sister 59    Social History Social History  Substance Use Topics  . Smoking status: Never Smoker  . Smokeless tobacco: Never Used  . Alcohol use No    Review of Systems Constitutional: No fever/chills Eyes: No visual changes. ENT: No sore throat. Cardiovascular: Denies chest pain. Respiratory: Denies shortness of breath. Gastrointestinal: No abdominal pain.  No nausea, no vomiting.  No diarrhea.  No constipation. Genitourinary: Negative for dysuria.Positive for urinary retention Musculoskeletal: Negative for neck pain.  Negative for back pain. Integumentary: Negative for rash. Neurological: Negative for headaches, focal weakness or numbness.   ____________________________________________   PHYSICAL EXAM:  VITAL SIGNS: ED Triage Vitals  Enc Vitals Group     BP  06/17/17 0532 (!) 121/93     Pulse Rate 06/17/17 0532 93     Resp 06/17/17 0532 20     Temp 06/17/17 0532 98.1 F (36.7 C)     Temp Source 06/17/17 0532 Oral     SpO2 06/17/17 0532 96 %     Weight 06/17/17 0530 58.1 kg (128 lb)     Height --      Head Circumference --      Peak Flow --      Pain Score 06/17/17 0530 5     Pain Loc --      Pain Edu? --      Excl. in [place]? --     Constitutional: Alert and oriented. Well appearing and in no acute  distress. Eyes: Conjunctivae are normal.  Head: Atraumatic. Mouth/Throat: Mucous membranes are moist.  Oropharynx non-erythematous. Neck: No stridor.   Cardiovascular: Normal rate, regular rhythm. Good peripheral circulation. Grossly normal heart sounds. Respiratory: Normal respiratory effort.  No retractions. Lungs CTAB. Gastrointestinal: Soft and nontender. No distention.  Genitourinary: Suprapubic discomfort with palpation. Musculoskeletal: No lower extremity tenderness nor edema. No gross deformities of extremities. Neurologic:  Normal speech and language. No gross focal neurologic deficits are appreciated.  Skin:  Skin is warm, dry and intact. No rash noted.     Procedures   ____________________________________________   INITIAL IMPRESSION / ASSESSMENT AND PLAN / ED COURSE  Pertinent labs & imaging results that were available during my care of the patient were reviewed by me and considered in my medical decision making (see chart for details).  83 year old female presented with above stated history of physical exam of urinary retention. Foley catheter placed without difficulty with 700 mL's of urine noted in full catheter bag at this time. Patient has an upcoming appointment with urology on Tuesday.      ____________________________________________  FINAL CLINICAL IMPRESSION(S) / ED DIAGNOSES  Final diagnoses:  Urinary retention     MEDICATIONS GIVEN DURING THIS VISIT:  Medications - No data to display   NEW OUTPATIENT MEDICATIONS STARTED DURING THIS VISIT:  New Prescriptions   No medications on file    Modified Medications   No medications on file    Discontinued Medications   No medications on file     Note:  This document was prepared using Dragon voice recognition software and may include unintentional dictation errors.    Gregor Hams, MD 06/18/17 2240

## 2017-06-21 ENCOUNTER — Ambulatory Visit (INDEPENDENT_AMBULATORY_CARE_PROVIDER_SITE_OTHER): Payer: Medicare Other | Admitting: Urology

## 2017-06-21 ENCOUNTER — Encounter: Payer: Self-pay | Admitting: Urology

## 2017-06-21 ENCOUNTER — Telehealth: Payer: Self-pay | Admitting: Urology

## 2017-06-21 VITALS — BP 122/83 | HR 81 | Temp 97.6°F | Ht 60.0 in

## 2017-06-21 DIAGNOSIS — N2889 Other specified disorders of kidney and ureter: Secondary | ICD-10-CM

## 2017-06-21 DIAGNOSIS — R339 Retention of urine, unspecified: Secondary | ICD-10-CM

## 2017-06-21 NOTE — Progress Notes (Signed)
06/21/2017 8:42 AM   [name] [birth year] [id number]  Referring provider: Lavera Guise, [place], [place] [address]  Chief Complaint  Patient presents with  . Urinary Retention    HPI: Patient seen in consultation for a couple of issues:  #1 urinary retention-the patient was seen 04/15/2017 and she complained of some difficulty urinating. She was treated for a "UTI with Cipro. She then presented September 7 and a Foley catheter was placed because of a 700 mL post void. She reports an in September 10 the emergency department again and said the Foley catheter had fallen out and it was replaced with a post void of about 500. She also complained of rectal pain and constipation. She is on tamsulosin. I found 2 or 3 urine cultures which were all negative. She's had this issue on and off x 20 years. She was constipated and took miralax and prune juice. She's had diarrhea. NG risk includes back fusion and possible neuropathy. She tried CIC and couldn't do it. The last time she needed a catheter for a prolonged period was about 2 years ago.   #2 left renal mass-patient underwent a CT scan of the abdomen and pelvis with contrast 06/20/2017 because of abdominal pain and pressure. This revealed an endophytic somewhat ill-defined 1.1 cm left mid posterior renal mass that appeared to enhance 215 Hounsfield units and 91 Hounsfield units on the delayed images.  Today, the patient is seen for the above. She's doing well. Urine clear.    PMH: Past Medical History:  Diagnosis Date  . Anxiety   . Chronic back pain   . High cholesterol   . History of urinary retention   . Hypertension   . Pneumonia     Surgical History: Past Surgical History:  Procedure Laterality Date  . ABDOMINAL HYSTERECTOMY    . BACK SURGERY    . BREAST BIOPSY Right 2002   neg    Home Medications:  Allergies as of 06/21/2017      Reactions   Cortisone Other (See Comments)   Headache   Morphine  And Related Other (See Comments)   headache   Prednisone Other (See Comments)   Headache   Sulfa Antibiotics Nausea Only      Medication List       Accurate as of 06/21/17  8:42 AM. Always use your most recent med list.          amLODipine 5 MG tablet Commonly known as:  NORVASC Take 5 mg by mouth daily.   Calcium 600-200 MG-UNIT tablet Take 1 tablet by mouth daily.   carvedilol 6.25 MG tablet Commonly known as:  COREG Take 6.25 mg by mouth 2 (two) times daily with a meal.   cephALEXin 500 MG capsule Commonly known as:  KEFLEX Take 1 capsule (500 mg total) by mouth 3 (three) times daily.   escitalopram 5 MG tablet Commonly known as:  LEXAPRO Take 5 mg by mouth daily.   HYDROcodone-acetaminophen 10-325 MG tablet Commonly known as:  NORCO Take 1 tablet by mouth 3 (three) times daily.   LORazepam 0.5 MG tablet Commonly known as:  ATIVAN Take 0.5 mg by mouth 2 (two) times daily.   meloxicam 7.5 MG tablet Commonly known as:  MOBIC Take 7.5 mg by mouth daily as needed for pain.   mirtazapine 30 MG tablet Commonly known as:  REMERON Take 30 mg by mouth at bedtime.   multivitamin tablet Take 1 tablet by mouth daily.  PRESERVISION AREDS 2 Caps Take 1 capsule by mouth 2 (two) times daily.   tamsulosin 0.4 MG Caps capsule Commonly known as:  FLOMAX Take 0.4 mg by mouth.       Allergies:  Allergies  Allergen Reactions  . Cortisone Other (See Comments)    Headache   . Morphine And Related Other (See Comments)    headache  . Prednisone Other (See Comments)    Headache   . Sulfa Antibiotics Nausea Only    Family History: Family History  Problem Relation Age of Onset  . Breast cancer Sister 34  . Breast cancer Maternal Grandmother 46  . Breast cancer Sister 33  . Bladder Cancer Brother   . Hypertension Neg Hx     Social History:  reports that she has never smoked. She has never used smokeless tobacco. She reports that she does not drink alcohol  or use drugs.  ROS: UROLOGY Frequent Urination?: No Hard to postpone urination?: No Burning/pain with urination?: No Get up at night to urinate?: No Leakage of urine?: No Urine stream starts and stops?: No Trouble starting stream?: Yes Do you have to strain to urinate?: Yes Blood in urine?: No Urinary tract infection?: Yes Sexually transmitted disease?: No Injury to kidneys or bladder?: No Painful intercourse?: No Weak stream?: Yes Currently pregnant?: No Vaginal bleeding?: No Last menstrual period?: n  Gastrointestinal Nausea?: Yes Vomiting?: No Indigestion/heartburn?: No Diarrhea?: Yes Constipation?: Yes  Constitutional Fever: No Night sweats?: Yes Weight loss?: Yes Fatigue?: Yes  Skin Skin rash/lesions?: No Itching?: No  Eyes Blurred vision?: Yes Double vision?: Yes  Ears/Nose/Throat Sore throat?: No Sinus problems?: Yes  Hematologic/Lymphatic Swollen glands?: No Easy bruising?: No  Cardiovascular Leg swelling?: Yes Chest pain?: No  Respiratory Cough?: No Shortness of breath?: No  Endocrine Excessive thirst?: No  Musculoskeletal Back pain?: Yes Joint pain?: Yes  Neurological Headaches?: Yes Dizziness?: No  Psychologic Depression?: Yes Anxiety?: Yes  Physical Exam: BP 122/83 (BP Location: Left Arm, Patient Position: Sitting, Cuff Size: Normal)   Pulse 81   Temp 97.6 F (36.4 C)   Ht 5' (1.524 m)   Constitutional:  Alert and oriented, No acute distress. HEENT: [place] AT, moist mucus membranes.  Trachea midline, no masses. Cardiovascular: No clubbing, cyanosis, or edema. Respiratory: Normal respiratory effort, no increased work of breathing. GI: Abdomen is soft, nontender, nondistended, no abdominal masses GU: No CVA tenderness. Urine clear.  Skin: No rashes, bruises or suspicious lesions. Lymph: No cervical or inguinal adenopathy. Neurologic: Grossly intact, no focal deficits, moving all 4 extremities. Psychiatric: Normal mood and  affect.  Laboratory Data: Lab Results  Component Value Date   WBC 8.1 06/15/2017   HGB 13.4 06/15/2017   HCT 39.6 06/15/2017   MCV 85.0 06/15/2017   PLT 205 06/15/2017    Lab Results  Component Value Date   CREATININE 0.63 06/15/2017    No results found for: PSA  No results found for: TESTOSTERONE  No results found for: HGBA1C  Urinalysis    Component Value Date/Time   COLORURINE STRAW (A) 06/15/2017 0811   APPEARANCEUR CLEAR (A) 06/15/2017 0811   APPEARANCEUR Clear 08/05/2014 0115   LABSPEC 1.004 (L) 06/15/2017 0811   LABSPEC 1.004 08/05/2014 0115   PHURINE 7.0 06/15/2017 0811   GLUCOSEU NEGATIVE 06/15/2017 0811   GLUCOSEU Negative 08/05/2014 0115   HGBUR NEGATIVE 06/15/2017 [place] 06/15/2017 0811   BILIRUBINUR Negative 08/05/2014 0115   KETONESUR NEGATIVE 06/15/2017 0811   PROTEINUR NEGATIVE 06/15/2017 8119  UROBILINOGEN 0.2 03/07/2008 1048   NITRITE NEGATIVE 06/15/2017 0811   LEUKOCYTESUR NEGATIVE 06/15/2017 0811   LEUKOCYTESUR Negative 08/05/2014 0115    Pertinent Imaging: CT A/P images reviewed Previous Dr and ED records reviewed   Assessment & Plan:    1) urinary retention-we discussed several options. We discussed avoiding trial in a few days. We discussed learning CIC. We discussed keeping the Foley long-term or switching to a suprapubic tube. We also discussed referral for urodynamics. All questions answered. They don't wish to pursue UDS or CIC, so we'll keep foley for another week and void trial next week.   2) renal mass-I discussed with the patient and her family the nature of renal masses-benign and malignant. We discussed the nature, risks and benefits of surveillance, biopsy or treatment of this mass. They'll consider and will characterize with an MRI. In the big picture we discussed small renal masses typically grow very slowly and rarely metastasize, however, some patients do go on to need management of the mass if it grows.  We'll plan an MRI in 6 weeks.   There are no diagnoses linked to this encounter.  No Follow-up on file.  Festus Aloe, [facility] [address], [place], [place] [address] [phone]

## 2017-06-21 NOTE — Telephone Encounter (Signed)
Patient stated they will need a refill on the tamsolsin called into the pharmacy at [facility] in [place], [place]

## 2017-06-22 MED ORDER — TAMSULOSIN HCL 0.4 MG PO CAPS: 0.4000 mg | ORAL_CAPSULE | Freq: Every day | ORAL | 1 refills | Status: DC

## 2017-06-22 NOTE — Telephone Encounter (Signed)
Refill sent.

## 2017-06-28 ENCOUNTER — Ambulatory Visit (INDEPENDENT_AMBULATORY_CARE_PROVIDER_SITE_OTHER): Payer: Medicare Other

## 2017-06-28 VITALS — BP 111/87 | HR 97 | Ht 60.0 in | Wt 125.0 lb

## 2017-06-28 DIAGNOSIS — R339 Retention of urine, unspecified: Secondary | ICD-10-CM | POA: Diagnosis not present

## 2017-06-28 NOTE — Progress Notes (Signed)
Catheter Removal  Patient is present today for a catheter removal.  17ml of water was drained from the balloon. A 16FR foley cath was removed from the bladder no complications were noted . Patient tolerated well.  Preformed by: Toniann Fail, LPN   Follow up/ Additional notes: Pt will RTC at 3pm for bladder scan.   Pt called and left a message that she has a headache and does not feel well so she is not coming back for bladder scan. Pt stated that she is urinating ok.  Blood pressure 111/87, pulse 97, height 5' (1.524 m), weight 125 lb (56.7 kg).

## 2017-07-08 ENCOUNTER — Ambulatory Visit: Payer: Medicare Other

## 2017-07-15 ENCOUNTER — Telehealth: Payer: Self-pay

## 2017-07-15 NOTE — Telephone Encounter (Signed)
Pt called c/o fever, chills, uncontrollable shaking, urinary frequency, and back/neck pain. Pt stated that she has taken her nerve pill and has not gotten any relief. Made pt aware should seek tx at ER. Pt stated that she cant go to the ER because her daughters wont take her nor go with her if she calls 911. Pt insisted on making an appt. An appt was made for Monday with [facility]. Reinforced with pt again to seek tx at the ER. Once again pt refused but voiced understanding of whole conversation.

## 2017-07-18 ENCOUNTER — Ambulatory Visit: Payer: Self-pay | Admitting: Urology

## 2017-07-18 NOTE — Progress Notes (Deleted)
07/19/2017 10:05 PM   [name] 08-02-1925 [id number]  Referring provider: Lavera Guise, [place], [place] [address]  No chief complaint on file.   HPI: 83 yo WF with urinary retention and a left renal mass who contacted the office on Friday with the complaints of  fever, chills, uncontrollable shaking, urinary frequency, and back/neck pain.   She was instructed to seek attention in the emergency room,  but she stated she cannot go to the ER because her daughters wont take her nor go with her if she calls 911.   #1 urinary retention-the patient was seen 04/15/2017 and she complained of some difficulty urinating. She was treated for a "UTI with Cipro. She then presented September 7 and a Foley catheter was placed because of a 700 mL post void. She reports an in September 10 the emergency department again and said the Foley catheter had fallen out and it was replaced with a post void of about 500. She also complained of rectal pain and constipation. She is on tamsulosin. I found 2 or 3 urine cultures which were all negative. She's had this issue on and off x 20 years. She was constipated and took miralax and prune juice. She's had diarrhea. NG risk includes back fusion and possible neuropathy. She tried CIC and couldn't do it. The last time she needed a catheter for a prolonged period was about 2 years ago.   #2 left renal mass-patient underwent a CT scan of the abdomen and pelvis with contrast 06/20/2017 because of abdominal pain and pressure. This revealed an endophytic somewhat ill-defined 1.1 cm left mid posterior renal mass that appeared to enhance 215 Hounsfield units and 91 Hounsfield units on the delayed images.  A follow up MRI is recommended and she has an appointment on 08/18/2017 to discuss MRI results.     PMH: Past Medical History:  Diagnosis Date  . Anxiety   . Chronic back pain   . High cholesterol   . History of urinary retention   . Hypertension    . Pneumonia     Surgical History: Past Surgical History:  Procedure Laterality Date  . ABDOMINAL HYSTERECTOMY    . BACK SURGERY    . BREAST BIOPSY Right 2002   neg    Home Medications:  Allergies as of 07/19/2017      Reactions   Cortisone Other (See Comments)   Headache   Morphine And Related Other (See Comments)   headache   Prednisone Other (See Comments)   Headache   Sulfa Antibiotics Nausea Only      Medication List       Accurate as of 07/18/17 10:05 PM. Always use your most recent med list.          amLODipine 5 MG tablet Commonly known as:  NORVASC Take 5 mg by mouth daily.   Calcium 600-200 MG-UNIT tablet Take 1 tablet by mouth daily.   carvedilol 6.25 MG tablet Commonly known as:  COREG Take 6.25 mg by mouth 2 (two) times daily with a meal.   escitalopram 5 MG tablet Commonly known as:  LEXAPRO Take 5 mg by mouth daily.   HYDROcodone-acetaminophen 10-325 MG tablet Commonly known as:  NORCO Take 1 tablet by mouth 3 (three) times daily.   LORazepam 0.5 MG tablet Commonly known as:  ATIVAN Take 0.5 mg by mouth 2 (two) times daily.   meloxicam 7.5 MG tablet Commonly known as:  MOBIC Take 7.5 mg by  mouth daily as needed for pain.   mirtazapine 30 MG tablet Commonly known as:  REMERON Take 30 mg by mouth at bedtime.   multivitamin tablet Take 1 tablet by mouth daily.   PRESERVISION AREDS 2 Caps Take 1 capsule by mouth 2 (two) times daily.   tamsulosin 0.4 MG Caps capsule Commonly known as:  FLOMAX Take 1 capsule (0.4 mg total) by mouth daily.       Allergies:  Allergies  Allergen Reactions  . Cortisone Other (See Comments)    Headache   . Morphine And Related Other (See Comments)    headache  . Prednisone Other (See Comments)    Headache   . Sulfa Antibiotics Nausea Only    Family History: Family History  Problem Relation Age of Onset  . Breast cancer Sister 5  . Breast cancer Maternal Grandmother 53  . Breast cancer  Sister 24  . Bladder Cancer Brother   . Hypertension Neg Hx     Social History:  reports that she has never smoked. She has never used smokeless tobacco. She reports that she does not drink alcohol or use drugs.  ROS:                                        Physical Exam: There were no vitals taken for this visit.  Constitutional:  Alert and oriented, No acute distress. HEENT: [place] AT, moist mucus membranes.  Trachea midline, no masses. Cardiovascular: No clubbing, cyanosis, or edema. Respiratory: Normal respiratory effort, no increased work of breathing. GI: Abdomen is soft, nontender, nondistended, no abdominal masses GU: No CVA tenderness. Urine clear.  Skin: No rashes, bruises or suspicious lesions. Lymph: No cervical or inguinal adenopathy. Neurologic: Grossly intact, no focal deficits, moving all 4 extremities. Psychiatric: Normal mood and affect.  Laboratory Data: Lab Results  Component Value Date   WBC 8.1 06/15/2017   HGB 13.4 06/15/2017   HCT 39.6 06/15/2017   MCV 85.0 06/15/2017   PLT 205 06/15/2017    Lab Results  Component Value Date   CREATININE 0.63 06/15/2017    Urinalysis    Component Value Date/Time   COLORURINE STRAW (A) 06/15/2017 0811   APPEARANCEUR CLEAR (A) 06/15/2017 0811   APPEARANCEUR Clear 08/05/2014 0115   LABSPEC 1.004 (L) 06/15/2017 0811   LABSPEC 1.004 08/05/2014 0115   PHURINE 7.0 06/15/2017 0811   GLUCOSEU NEGATIVE 06/15/2017 0811   GLUCOSEU Negative 08/05/2014 0115   HGBUR NEGATIVE 06/15/2017 0811   BILIRUBINUR NEGATIVE 06/15/2017 0811   BILIRUBINUR Negative 08/05/2014 0115   KETONESUR NEGATIVE 06/15/2017 0811   PROTEINUR NEGATIVE 06/15/2017 0811   UROBILINOGEN 0.2 03/07/2008 1048   NITRITE NEGATIVE 06/15/2017 0811   LEUKOCYTESUR NEGATIVE 06/15/2017 0811   LEUKOCYTESUR Negative 08/05/2014 0115   I have reviewed the labs.  Pertinent Imaging: ***  Assessment & Plan:    1) urinary retention-we  discussed several options. We discussed avoiding trial in a few days. We discussed learning CIC. We discussed keeping the Foley long-term or switching to a suprapubic tube. We also discussed referral for urodynamics. All questions answered. They don't wish to pursue UDS or CIC, so we'll keep foley for another week and void trial next week.   2) renal mass-I discussed with the patient and her family the nature of renal masses-benign and malignant. We discussed the nature, risks and benefits of surveillance, biopsy or treatment of this  mass. They'll consider and will characterize with an MRI. In the big picture we discussed small renal masses typically grow very slowly and rarely metastasize, however, some patients do go on to need management of the mass if it grows. We'll plan an MRI in 6 weeks.     No Follow-up on file.  Zara Council, PA-C  [facility] [address], [place], [place] [address] [phone]

## 2017-07-19 ENCOUNTER — Ambulatory Visit: Payer: Medicare Other | Admitting: Urology

## 2017-07-25 ENCOUNTER — Ambulatory Visit (INDEPENDENT_AMBULATORY_CARE_PROVIDER_SITE_OTHER): Payer: Medicare Other | Admitting: Urology

## 2017-07-25 ENCOUNTER — Emergency Department
Admission: EM | Admit: 2017-07-25 | Discharge: 2017-07-26 | Disposition: A | Discharge status: Home / Self Care | Payer: Medicare Other | Attending: Emergency Medicine | Admitting: Emergency Medicine

## 2017-07-25 ENCOUNTER — Encounter: Payer: Self-pay | Admitting: Urology

## 2017-07-25 VITALS — BP 113/81 | HR 111 | Ht 60.0 in | Wt 125.8 lb

## 2017-07-25 DIAGNOSIS — Z79899 Other long term (current) drug therapy: Secondary | ICD-10-CM | POA: Diagnosis not present

## 2017-07-25 DIAGNOSIS — T839XXA Unspecified complication of genitourinary prosthetic device, implant and graft, initial encounter: Secondary | ICD-10-CM

## 2017-07-25 DIAGNOSIS — Y658 Other specified misadventures during surgical and medical care: Secondary | ICD-10-CM | POA: Diagnosis not present

## 2017-07-25 DIAGNOSIS — Z7982 Long term (current) use of aspirin: Secondary | ICD-10-CM | POA: Insufficient documentation

## 2017-07-25 DIAGNOSIS — R339 Retention of urine, unspecified: Secondary | ICD-10-CM

## 2017-07-25 DIAGNOSIS — N2889 Other specified disorders of kidney and ureter: Secondary | ICD-10-CM

## 2017-07-25 DIAGNOSIS — T8384XA Pain from genitourinary prosthetic devices, implants and grafts, initial encounter: Secondary | ICD-10-CM | POA: Insufficient documentation

## 2017-07-25 DIAGNOSIS — F411 Generalized anxiety disorder: Secondary | ICD-10-CM | POA: Diagnosis not present

## 2017-07-25 DIAGNOSIS — T83091A Other mechanical complication of indwelling urethral catheter, initial encounter: Secondary | ICD-10-CM | POA: Diagnosis not present

## 2017-07-25 DIAGNOSIS — I10 Essential (primary) hypertension: Secondary | ICD-10-CM | POA: Diagnosis not present

## 2017-07-25 LAB — BLADDER SCAN AMB NON-IMAGING: Scan Result: 470

## 2017-07-25 MED ORDER — LIDOCAINE HCL 2 % EX GEL
CUTANEOUS | Status: AC
  Administered 2017-07-25: 1 via TOPICAL
  Filled 2017-07-25: qty 10

## 2017-07-25 MED ORDER — LIDOCAINE HCL 2 % EX GEL
1.0000 "application " | Freq: Once | CUTANEOUS | Status: AC
  Administered 2017-07-25: 1 via TOPICAL

## 2017-07-25 NOTE — ED Notes (Addendum)
Pt reports being seen at [facility] and had 73fr foley placed due to retention where family states had over 445ml urine return then. Family reports since that time pt has reported pain to catheter entrance site. Pt reports "they kept poking down there and it hurt." Pt was asked to point to location of pain and points to catheter entrance site. No bleeding noted or swelling, family reports last time she had a catheter it did not hurt like this. Family states pt has not been able to walk very well since due to the pain. 435mls of urine was drained during this RN assessment. EDP notified.

## 2017-07-25 NOTE — Progress Notes (Signed)
Simple Catheter Placement  Due to urinary retention patient is present today for a foley cath placement.  Patient was cleaned and prepped in a sterile fashion with betadine and lidocaine jelly 2% was instilled into the urethra.  A 16 FR foley catheter was inserted, urine return was noted  47ml, urine was yellow in color.  The balloon was filled with 10cc of sterile water.  A leg bag was attached for drainage. Patient was also given a night bag to take home and was given instruction on how to change from one bag to another.  Patient was given instruction on proper catheter care.  Patient tolerated well, no complications were noted.  Preformed by: Lyndee Hensen CMA  Additional notes/ Follow up: One week

## 2017-07-25 NOTE — ED Notes (Signed)
Bladder Scan showed 45ml after mult attempts EDP notified and is in pt's room

## 2017-07-25 NOTE — Progress Notes (Signed)
07/25/2017 4:33 PM   [name] [birth year] [id number]  Referring provider: Lavera Guise, [place], [place] [address]  Chief Complaint  Patient presents with  . Urinary Retention    PCP sent over because of retention    HPI: 83 yo WF with urinary retention and a left renal mass who contacted the office on Friday with the complaints of  fever, chills, uncontrollable shaking, urinary frequency, and back/neck pain.   She was instructed to seek attention in the emergency room,  but she stated she cannot go to the ER because her daughters wont take her nor go with her if she calls 911.   #1 urinary retention-the patient was seen 04/15/2017 and she complained of some difficulty urinating. She was treated for a "UTI with Cipro. She then presented September 7 and a Foley catheter was placed because of a 700 mL post void. She reports an in September 10 the emergency department again and said the Foley catheter had fallen out and it was replaced with a post void of about 500. She also complained of rectal pain and constipation. She is on tamsulosin. I found 2 or 3 urine cultures which were all negative. She's had this issue on and off x 20 years. She was constipated and took miralax and prune juice. She's had diarrhea. NG risk includes back fusion and possible neuropathy. She tried CIC and couldn't do it. The last time she needed a catheter for a prolonged period was about 2 years ago.  She was seen at her PCP's office today and was instructed to come to our office as their bladder scan noted a PVR of 400 cc's.  She is quite uncomfortable at this time. She is complaining of painful urination, intermittency and straining to urinate. PVR our offices at 470 mL. Patient did attempt to void prior to our scan.  She has not had a recent fever, nausea or vomiting. She does have chills which I believe are due to her suprapubic pain.  She has not had gross hematuria.  #2 left renal  mass-patient underwent a CT scan of the abdomen and pelvis with contrast 06/20/2017 because of abdominal pain and pressure. This revealed an endophytic somewhat ill-defined 1.1 cm left mid posterior renal mass that appeared to enhance 215 Hounsfield units and 91 Hounsfield units on the delayed images.  A follow up MRI is recommended and she has an appointment on 08/18/2017 to discuss MRI results.     PMH: Past Medical History:  Diagnosis Date  . Anxiety   . Chronic back pain   . High cholesterol   . History of urinary retention   . Hypertension   . Pneumonia     Surgical History: Past Surgical History:  Procedure Laterality Date  . ABDOMINAL HYSTERECTOMY    . APPENDECTOMY    . BACK SURGERY    . BREAST BIOPSY Right 2002   neg  . TONSILLECTOMY      Home Medications:  Allergies as of 07/25/2017      Reactions   Cortisone Other (See Comments)   Headache   Fluticasone Swelling   Facial   Levofloxacin Nausea And Vomiting   Meperidine Other (See Comments)   Morphine And Related Other (See Comments)   headache   Morphine Sulfate    Nisoldipine    Penicillin G Potassium In D5w    Prednisone Other (See Comments)   Headache   Sulfa Antibiotics Nausea Only  Medication List       Accurate as of 07/25/17  4:33 PM. Always use your most recent med list.          amLODipine 5 MG tablet Commonly known as:  NORVASC Take 5 mg by mouth daily.   aspirin EC 81 MG tablet Take 81 mg by mouth.   CALCIUM 600 600 MG Tabs tablet Generic drug:  calcium carbonate Take by mouth.   Calcium 600-200 MG-UNIT tablet Take 1 tablet by mouth daily.   CALCIUM CARBONATE-VITAMIN D3 PO Take by mouth.   carvedilol 6.25 MG tablet Commonly known as:  COREG Take 6.25 mg by mouth 2 (two) times daily with a meal.   cefUROXime 250 MG tablet Commonly known as:  CEFTIN Take by mouth.   cephALEXin 500 MG capsule Commonly known as:  KEFLEX Take 500 mg by mouth 4 (four) times daily.     diclofenac sodium 1 % Gel Commonly known as:  VOLTAREN Apply topically.   escitalopram 5 MG tablet Commonly known as:  LEXAPRO Take 5 mg by mouth daily.   fentaNYL 25 MCG/HR patch Commonly known as:  [place] - dosed mcg/hr Place onto the skin.   HYDROcodone-acetaminophen 10-325 MG tablet Commonly known as:  NORCO Take 1 tablet by mouth 3 (three) times daily.   LORazepam 0.5 MG tablet Commonly known as:  ATIVAN Take 0.5 mg by mouth 2 (two) times daily.   meloxicam 7.5 MG tablet Commonly known as:  MOBIC Take 7.5 mg by mouth daily as needed for pain.   mirtazapine 30 MG tablet Commonly known as:  REMERON Take 30 mg by mouth at bedtime.   multivitamin tablet Take 1 tablet by mouth daily.   nitrofurantoin 50 MG capsule Commonly known as:  MACRODANTIN Take by mouth.   PRESERVISION AREDS 2 Caps Take 1 capsule by mouth 2 (two) times daily.   RANITIDINE 75 PO Take by mouth.   tamsulosin 0.4 MG Caps capsule Commonly known as:  FLOMAX Take 1 capsule (0.4 mg total) by mouth daily.   vitamin C 1000 MG tablet Take by mouth.       Allergies:  Allergies  Allergen Reactions  . Cortisone Other (See Comments)    Headache   . Fluticasone Swelling    Facial  . Levofloxacin Nausea And Vomiting  . Meperidine Other (See Comments)  . Morphine And Related Other (See Comments)    headache  . Morphine Sulfate   . Nisoldipine   . Penicillin G Potassium In D5w   . Prednisone Other (See Comments)    Headache   . Sulfa Antibiotics Nausea Only    Family History: Family History  Problem Relation Age of Onset  . Breast cancer Sister 41  . Breast cancer Maternal Grandmother 60  . Breast cancer Sister 65  . Bladder Cancer Brother   . Hypertension Neg Hx   . Kidney cancer Neg Hx     Social History:  reports that she has never smoked. She has never used smokeless tobacco. She reports that she does not drink alcohol or use drugs.  ROS: UROLOGY Frequent Urination?:  No Hard to postpone urination?: No Burning/pain with urination?: Yes Get up at night to urinate?: No Leakage of urine?: No Urine stream starts and stops?: No Trouble starting stream?: Yes Do you have to strain to urinate?: Yes Blood in urine?: No Urinary tract infection?: No Sexually transmitted disease?: No Injury to kidneys or bladder?: No Painful intercourse?: No Weak stream?: No Currently pregnant?:  No Vaginal bleeding?: No Last menstrual period?: n  Gastrointestinal Nausea?: No Vomiting?: No Indigestion/heartburn?: No Diarrhea?: No Constipation?: No  Constitutional Fever: No Night sweats?: No Weight loss?: No Fatigue?: No  Skin Skin rash/lesions?: No Itching?: No  Eyes Blurred vision?: No Double vision?: No  Ears/Nose/Throat Sore throat?: No Sinus problems?: No  Hematologic/Lymphatic Swollen glands?: No Easy bruising?: No  Cardiovascular Leg swelling?: No Chest pain?: No  Respiratory Cough?: No Shortness of breath?: No  Endocrine Excessive thirst?: No  Musculoskeletal Back pain?: No Joint pain?: No  Neurological Headaches?: No Dizziness?: No  Psychologic Depression?: No Anxiety?: No  Physical Exam: BP 113/81   Pulse (!) 111   Ht 5' (1.524 m)   Wt 125 lb 12.8 oz (57.1 kg)   BMI 24.57 kg/m   Constitutional:  Alert and oriented, No acute distress. HEENT: [place] AT, moist mucus membranes.  Trachea midline, no masses. Cardiovascular: No clubbing, cyanosis, or edema. Respiratory: Normal respiratory effort, no increased work of breathing. GI: Abdomen is soft, nontender, nondistended, no abdominal masses GU: No CVA tenderness. Urine clear.  Skin: No rashes, bruises or suspicious lesions. Lymph: No cervical or inguinal adenopathy. Neurologic: Grossly intact, no focal deficits, moving all 4 extremities. Psychiatric: Normal mood and affect.  Laboratory Data: Lab Results  Component Value Date   WBC 8.1 06/15/2017   HGB 13.4 06/15/2017     HCT 39.6 06/15/2017   MCV 85.0 06/15/2017   PLT 205 06/15/2017    Lab Results  Component Value Date   CREATININE 0.63 06/15/2017     I have reviewed the labs.  Pertinent Imaging: Results for ANETRIA, HARWICK (MRN [id number]) as of 07/25/2017 16:23  Ref. Range 07/25/2017 16:12  Scan Result Unknown 470    Assessment & Plan:    1) urinary retention-we reviewed several options. We discussed a voiding trial in a few days. She failed CIC.  We discussed keeping the Foley long-term or switching to a suprapubic tube.  We also discussed referral for urodynamics. All questions answered. They don't wish to pursue UDS or CIC, so we'll keep foley for another week and void trial next week - though I am doubtful it will be successful  2) renal mass- patient is scheduled for an MRI   Return in about 1 week (around 08/01/2017) for TOV on nurse schedule.  Zara Council, PA-C  [facility] [address] [place],  [address] [phone]

## 2017-07-25 NOTE — ED Provider Notes (Signed)
[facility] Emergency Department Provider Note  ____________________________________________   I have reviewed the triage vital signs and the nursing notes.   HISTORY  Chief Complaint Urinary Cath Problems   History limited by: Not Limited   HPI [name] is a 83 y.o. female who presents to the emergency department today because of vulvar pain.   LOCATION:urethral meatus DURATION:started this afternoon TIMING: constant SEVERITY: severe QUALITY: burning CONTEXT: patient had a 16 gauge foley catheter placed at urology office today. Patient states pain started at that time MODIFYING FACTORS: none identified ASSOCIATED SYMPTOMS: some abdominal discomfort  Per medical record review patient has a history of urinary retention, was seen in urology office today and had foley catheter placed.   Past Medical History:  Diagnosis Date  . Anxiety   . Chronic back pain   . High cholesterol   . History of urinary retention   . Hypertension   . Pneumonia     Patient Active Problem List   Diagnosis Date Noted  . Hyponatremia 12/15/2015    Past Surgical History:  Procedure Laterality Date  . ABDOMINAL HYSTERECTOMY    . APPENDECTOMY    . BACK SURGERY    . BREAST BIOPSY Right 2002   neg  . TONSILLECTOMY      Prior to Admission medications   Medication Sig Start Date End Date Taking? Authorizing Provider  amLODipine (NORVASC) 5 MG tablet Take 5 mg by mouth daily.    [provider]  Ascorbic Acid (VITAMIN C) 1000 MG tablet Take by mouth.    [provider]  aspirin EC 81 MG tablet Take 81 mg by mouth.    [provider]  Calcium 600-200 MG-UNIT tablet Take 1 tablet by mouth daily.    [provider]  Calcium Carb-Cholecalciferol (CALCIUM CARBONATE-VITAMIN D3 PO) Take by mouth.    [provider]  calcium carbonate (CALCIUM 600) 600 MG TABS tablet Take by mouth.    [provider]   carvedilol (COREG) 6.25 MG tablet Take 6.25 mg by mouth 2 (two) times daily with a meal.    [provider]  cefUROXime (CEFTIN) 250 MG tablet Take by mouth. 11/14/15   [provider]  cephALEXin (KEFLEX) 500 MG capsule Take 500 mg by mouth 4 (four) times daily.    [provider]  diclofenac sodium (VOLTAREN) 1 % GEL Apply topically.    [provider]  escitalopram (LEXAPRO) 5 MG tablet Take 5 mg by mouth daily. 06/08/17   [provider]  fentaNYL (DURAGESIC - DOSED MCG/HR) 25 MCG/HR patch Place onto the skin.    [provider]  HYDROcodone-acetaminophen (NORCO) 10-325 MG tablet Take 1 tablet by mouth 3 (three) times daily.     [provider]  LORazepam (ATIVAN) 0.5 MG tablet Take 0.5 mg by mouth 2 (two) times daily.     [provider]  meloxicam (MOBIC) 7.5 MG tablet Take 7.5 mg by mouth daily as needed for pain.     [provider]  mirtazapine (REMERON) 30 MG tablet Take 30 mg by mouth at bedtime.    [provider]  Multiple Vitamin (MULTIVITAMIN) tablet Take 1 tablet by mouth daily.    [provider]  Multiple Vitamins-Minerals (PRESERVISION AREDS 2) CAPS Take 1 capsule by mouth 2 (two) times daily.    [provider]  nitrofurantoin (MACRODANTIN) 50 MG capsule Take by mouth. 04/04/12   [provider]  RaNITidine HCl (RANITIDINE 75  PO) Take by mouth.    [provider]  tamsulosin (FLOMAX) 0.4 MG CAPS capsule Take 1 capsule (0.4 mg total) by mouth daily. 06/22/17   Festus Aloe, MD    Allergies Cortisone; Fluticasone; Levofloxacin; Meperidine; Morphine and related; Morphine sulfate; Nisoldipine; Penicillin g potassium in d5w; Prednisone; and Sulfa antibiotics  Family History  Problem Relation Age of Onset  . Breast cancer Sister 61  . Breast cancer Maternal Grandmother 82  . Breast cancer Sister 12  . Bladder Cancer Brother   . Hypertension Neg Hx    . Kidney cancer Neg Hx     Social History Social History  Substance Use Topics  . Smoking status: Never Smoker  . Smokeless tobacco: Never Used  . Alcohol use No    Review of Systems Constitutional: No fever/chills Eyes: No visual changes. ENT: No sore throat. Cardiovascular: Denies chest pain. Respiratory: Denies shortness of breath. Gastrointestinal: No abdominal pain.  No nausea, no vomiting.  No diarrhea.   Genitourinary: Positive for pain at urethral meatus.  Musculoskeletal: Negative for back pain. Skin: Negative for rash. Neurological: Negative for headaches, focal weakness or numbness.  ____________________________________________   PHYSICAL EXAM:  VITAL SIGNS: ED Triage Vitals [07/25/17 2147]  Enc Vitals Group     BP 123/84     Pulse Rate 96     Resp 20     Temp 98.2 F (36.8 C)     Temp Source Oral     SpO2 96 %   Constitutional: Alert and oriented. Well appearing and in no distress. Eyes: Conjunctivae are normal.  ENT   Head: Normocephalic and atraumatic.   Nose: No congestion/rhinnorhea.   Mouth/Throat: Mucous membranes are moist.   Neck: No stridor. Hematological/Lymphatic/Immunilogical: No cervical lymphadenopathy. Cardiovascular: Normal rate, regular rhythm.  No murmurs, rubs, or gallops.  Respiratory: Normal respiratory effort without tachypnea nor retractions. Breath sounds are clear and equal bilaterally. No wheezes/rales/rhonchi. Gastrointestinal: Soft and non tender. No rebound. No guarding.  Genitourinary: Deferred Musculoskeletal: Normal range of motion in all extremities. No lower extremity edema. Neurologic:  Normal speech and language. No gross focal neurologic deficits are appreciated.  Skin:  Skin is warm, dry and intact. No rash noted. Psychiatric: Mood and affect are normal. Speech and behavior are normal. Patient exhibits appropriate insight and judgment.  ____________________________________________    LABS  (pertinent positives/negatives)  None  ____________________________________________   EKG  None  ____________________________________________    RADIOLOGY  None   ____________________________________________   PROCEDURES  Procedures  ____________________________________________   INITIAL IMPRESSION / ASSESSMENT AND PLAN / ED COURSE  Pertinent labs & imaging results that were available during my care of the patient were reviewed by me and considered in my medical decision making (see chart for details).  patient presented to the emergency department today because of concerns for pain around the site of Foley catheter insertion. think this is likely due to a large gauge Foley catheter. Patient was switched to a smaller gauge did feel improvement. We will discharge to follow-up with orthopedics. Discussed plan with patient and family.   ____________________________________________   FINAL CLINICAL IMPRESSION(S) / ED DIAGNOSES  Final diagnoses:  Problem with Foley catheter, initial encounter [facility])     Note: This dictation was prepared with Dragon dictation. Any transcriptional errors that result from this process are unintentional     Nance Pear, MD 07/26/17 [id number]

## 2017-07-25 NOTE — ED Triage Notes (Signed)
Patient had foley cath placed earlier today and now with pain and concern might not be draining properly.

## 2017-07-26 NOTE — ED Notes (Signed)

## 2017-07-26 NOTE — Discharge Instructions (Signed)
Please seek medical attention for any high fevers, chest pain, shortness of breath, change in behavior, persistent vomiting, bloody stool or any other new or concerning symptoms.  

## 2017-07-31 ENCOUNTER — Other Ambulatory Visit: Payer: Self-pay

## 2017-07-31 ENCOUNTER — Emergency Department: Payer: Medicare Other

## 2017-07-31 ENCOUNTER — Emergency Department
Admission: EM | Admit: 2017-07-31 | Discharge: 2017-07-31 | Disposition: A | Discharge status: Home / Self Care | Payer: Medicare Other | Attending: Emergency Medicine | Admitting: Emergency Medicine

## 2017-07-31 DIAGNOSIS — Z7982 Long term (current) use of aspirin: Secondary | ICD-10-CM | POA: Insufficient documentation

## 2017-07-31 DIAGNOSIS — I10 Essential (primary) hypertension: Secondary | ICD-10-CM | POA: Diagnosis not present

## 2017-07-31 DIAGNOSIS — F419 Anxiety disorder, unspecified: Secondary | ICD-10-CM | POA: Diagnosis not present

## 2017-07-31 DIAGNOSIS — Z960 Presence of urogenital implants: Secondary | ICD-10-CM | POA: Diagnosis not present

## 2017-07-31 DIAGNOSIS — Z79899 Other long term (current) drug therapy: Secondary | ICD-10-CM | POA: Insufficient documentation

## 2017-07-31 DIAGNOSIS — R519 Headache, unspecified: Secondary | ICD-10-CM

## 2017-07-31 DIAGNOSIS — M549 Dorsalgia, unspecified: Secondary | ICD-10-CM | POA: Diagnosis not present

## 2017-07-31 DIAGNOSIS — R6883 Chills (without fever): Secondary | ICD-10-CM | POA: Diagnosis not present

## 2017-07-31 DIAGNOSIS — R51 Headache: Secondary | ICD-10-CM | POA: Diagnosis not present

## 2017-07-31 LAB — URINALYSIS, COMPLETE (UACMP) WITH MICROSCOPIC
Bacteria, UA: NONE SEEN
Bilirubin Urine: NEGATIVE
GLUCOSE, UA: NEGATIVE mg/dL
HGB URINE DIPSTICK: NEGATIVE
Ketones, ur: NEGATIVE mg/dL
Leukocytes, UA: NEGATIVE
Nitrite: NEGATIVE
PH: 8 (ref 5.0–8.0)
PROTEIN: NEGATIVE mg/dL
SPECIFIC GRAVITY, URINE: 1.002 — AB (ref 1.005–1.030)
SQUAMOUS EPITHELIAL / LPF: NONE SEEN
WBC UA: NONE SEEN WBC/hpf (ref 0–5)

## 2017-07-31 LAB — CBC WITH DIFFERENTIAL/PLATELET
BASOS ABS: 0.1 10*3/uL (ref 0–0.1)
BASOS PCT: 1 %
EOS ABS: 0.2 10*3/uL (ref 0–0.7)
Eosinophils Relative: 2 %
HCT: 44.3 % (ref 35.0–47.0)
HEMOGLOBIN: 14.7 g/dL (ref 12.0–16.0)
LYMPHS ABS: 2.2 10*3/uL (ref 1.0–3.6)
Lymphocytes Relative: 31 %
MCH: 28.4 pg (ref 26.0–34.0)
MCHC: 33.2 g/dL (ref 32.0–36.0)
MCV: 85.6 fL (ref 80.0–100.0)
Monocytes Absolute: 0.5 10*3/uL (ref 0.2–0.9)
Monocytes Relative: 7 %
NEUTROS PCT: 59 %
Neutro Abs: 4.2 10*3/uL (ref 1.4–6.5)
PLATELETS: 208 10*3/uL (ref 150–440)
RBC: 5.17 MIL/uL (ref 3.80–5.20)
RDW: 14.5 % (ref 11.5–14.5)
WBC: 7.1 10*3/uL (ref 3.6–11.0)

## 2017-07-31 LAB — BASIC METABOLIC PANEL
Anion gap: 12 (ref 5–15)
BUN: 9 mg/dL (ref 6–20)
CHLORIDE: 98 mmol/L — AB (ref 101–111)
CO2: 23 mmol/L (ref 22–32)
CREATININE: 0.6 mg/dL (ref 0.44–1.00)
Calcium: 10.2 mg/dL (ref 8.9–10.3)
GFR calc Af Amer: >60 mL/min (ref >60)
GLUCOSE: 107 mg/dL — AB (ref 65–99)
Potassium: 3.9 mmol/L (ref 3.5–5.1)
SODIUM: 133 mmol/L — AB (ref 135–145)

## 2017-07-31 LAB — TROPONIN I

## 2017-07-31 MED ORDER — LORAZEPAM 1 MG PO TABS
1.0000 mg | ORAL_TABLET | Freq: Once | ORAL | Status: AC
  Administered 2017-07-31: 1 mg via ORAL
  Filled 2017-07-31: qty 1

## 2017-07-31 MED ORDER — HYDROCODONE-ACETAMINOPHEN 5-325 MG PO TABS: 2.0000 | ORAL_TABLET | Freq: Once | ORAL | Status: DC

## 2017-07-31 MED ORDER — HYDROCODONE-ACETAMINOPHEN 5-325 MG PO TABS
1.0000 | ORAL_TABLET | Freq: Once | ORAL | Status: AC
  Administered 2017-07-31: 1 via ORAL
  Filled 2017-07-31: qty 1

## 2017-07-31 MED ORDER — SODIUM CHLORIDE 0.9 % IV BOLUS (SEPSIS)
1000.0000 mL | Freq: Once | INTRAVENOUS | Status: AC
  Administered 2017-07-31: 1000 mL via INTRAVENOUS

## 2017-07-31 NOTE — ED Provider Notes (Signed)
[facility] Emergency Department Provider Note ____________________________________________   I have reviewed the triage vital signs and the triage nursing note.  HISTORY  Chief Complaint Headache   Historian Patient  HPI [name] is a 83 y.o. female who is hard of hearing presents today complaining that she feels "sick ".  She states that she has had a bad headache all night long and does not typically have headaches.  It sounds it was essentially gradual onset and is mostly right-sided and she feels pain into her right side jaw.  She denies chest pain or trouble breathing.  She denies abdominal pain.  She is currently on Keflex and has a indwelling Foley catheter for urinary retention and UTI.  Does not report any fevers.  Patient states that she does not want any pain medicine right now.  She thinks she is dehydrated.   Past Medical History:  Diagnosis Date  . Anxiety   . Chronic back pain   . High cholesterol   . History of urinary retention   . Hypertension   . Pneumonia     Patient Active Problem List   Diagnosis Date Noted  . Hyponatremia 12/15/2015    Past Surgical History:  Procedure Laterality Date  . ABDOMINAL HYSTERECTOMY    . APPENDECTOMY    . BACK SURGERY    . BREAST BIOPSY Right 2002   neg  . TONSILLECTOMY      Prior to Admission medications   Medication Sig Start Date End Date Taking? Authorizing Provider  amLODipine (NORVASC) 5 MG tablet Take 5 mg by mouth daily.    [provider]  Ascorbic Acid (VITAMIN C) 1000 MG tablet Take by mouth.    [provider]  aspirin EC 81 MG tablet Take 81 mg by mouth.    [provider]  Calcium 600-200 MG-UNIT tablet Take 1 tablet by mouth daily.    [provider]  Calcium Carb-Cholecalciferol (CALCIUM CARBONATE-VITAMIN D3 PO) Take by mouth.    [provider]  calcium carbonate (CALCIUM 600) 600 MG TABS tablet Take by mouth.    [provider]  carvedilol (COREG) 6.25 MG tablet Take 6.25 mg by mouth 2 (two) times daily with a meal.    [provider]  cefUROXime (CEFTIN) 250 MG tablet Take by mouth. 11/14/15   [provider]  cephALEXin (KEFLEX) 500 MG capsule Take 500 mg by mouth 4 (four) times daily.    [provider]  diclofenac sodium (VOLTAREN) 1 % GEL Apply topically.    [provider]  escitalopram (LEXAPRO) 5 MG tablet Take 5 mg by mouth daily. 06/08/17   [provider]  fentaNYL (DURAGESIC - DOSED MCG/HR) 25 MCG/HR patch Place onto the skin.    [provider]  HYDROcodone-acetaminophen (NORCO) 10-325 MG tablet Take 1 tablet by mouth 3 (three) times daily.     [provider]  LORazepam (ATIVAN) 0.5 MG tablet Take 0.5 mg by mouth 2 (two) times daily.     [provider]  meloxicam (MOBIC) 7.5 MG tablet Take 7.5 mg by mouth daily as needed for pain.     [provider]  mirtazapine (REMERON) 30 MG tablet Take 30 mg by mouth at bedtime.    [provider]  Multiple Vitamin (MULTIVITAMIN) tablet Take 1 tablet by mouth daily.    [provider]  Multiple Vitamins-Minerals (PRESERVISION AREDS 2) CAPS Take 1 capsule by mouth 2 (two) times daily.  [provider]  nitrofurantoin (MACRODANTIN) 50 MG capsule Take by mouth. 04/04/12   [provider]  RaNITidine HCl (RANITIDINE 75 PO) Take by mouth.    [provider]  tamsulosin (FLOMAX) 0.4 MG CAPS capsule Take 1 capsule (0.4 mg total) by mouth daily. 06/22/17   Festus Aloe, MD    Allergies  Allergen Reactions  . Cortisone Other (See Comments)    Headache   . Fluticasone Swelling    Facial  . Levofloxacin Nausea And Vomiting  . Meperidine Other (See Comments)  . Morphine And Related Other (See Comments)    headache  . Morphine Sulfate   . Nisoldipine   . Penicillin G Potassium In D5w   . Prednisone Other (See Comments)     Headache   . Sulfa Antibiotics Nausea Only    Family History  Problem Relation Age of Onset  . Breast cancer Sister 42  . Breast cancer Maternal Grandmother 44  . Breast cancer Sister 2  . Bladder Cancer Brother   . Hypertension Neg Hx   . Kidney cancer Neg Hx     Social History Social History  Substance Use Topics  . Smoking status: Never Smoker  . Smokeless tobacco: Never Used  . Alcohol use No    Review of Systems  Constitutional: Negative for fever. Eyes: Negative for visual changes. ENT: Negative for sore throat.  Positive some right-sided jaw pain. Cardiovascular: Negative for chest pain. Respiratory: Negative for shortness of breath. Gastrointestinal: Negative for abdominal pain, vomiting and diarrhea. Genitourinary: Negative for dysuria. Musculoskeletal: Negative for back pain. Skin: Negative for rash. Neurological: Positive as per HPI for headache.  ____________________________________________   PHYSICAL EXAM:  VITAL SIGNS: ED Triage Vitals [07/31/17 0914]  Enc Vitals Group     BP      Pulse      Resp      Temp      Temp src      SpO2      Weight 125 lb (56.7 kg)     Height 5' (1.524 m)     Head Circumference      Peak Flow      Pain Score      Pain Loc      Pain Edu?      Excl. in [place]?      Constitutional: Alert and oriented, appearing. Well appearing and in no distress. HEENT   Head: Normocephalic and atraumatic.  No tender temporal artery.      Eyes: Conjunctivae are normal. Pupils equal and round.       Ears:         Nose: No congestion/rhinnorhea.   Mouth/Throat: Mucous membranes are mildly dry.   Neck: No stridor. Cardiovascular/Chest: Tachycardic rate, regular rhythm.  No murmurs, rubs, or gallops. Respiratory: Normal respiratory effort without tachypnea nor retractions. Breath sounds are clear and equal bilaterally. No wheezes/rales/rhonchi. Gastrointestinal: Soft. No distention, no guarding, no rebound. Nontender.     Genitourinary/rectal:Deferred Musculoskeletal: Nontender with normal range of motion in all extremities. No joint effusions.  No lower extremity tenderness.  No edema. Neurologic:  Normal speech and language. No gross or focal neurologic deficits are appreciated. Skin:  Skin is warm, dry and intact. No rash noted. Psychiatric: Mood and affect are normal. Speech and behavior are normal. Patient exhibits appropriate insight and judgment.   ____________________________________________  LABS (pertinent positives/negatives) I, Lisa Roca, MD the attending physician have reviewed the labs noted below.  Labs Reviewed  BASIC METABOLIC  PANEL - Abnormal; Notable for the following:       Result Value   Sodium 133 (*)    Chloride 98 (*)    Glucose, Bld 107 (*)    All other components within normal limits  URINALYSIS, COMPLETE (UACMP) WITH MICROSCOPIC - Abnormal; Notable for the following:    Color, Urine COLORLESS (*)    APPearance CLEAR (*)    Specific Gravity, Urine 1.002 (*)    All other components within normal limits  CBC WITH DIFFERENTIAL/PLATELET  TROPONIN I    ____________________________________________    EKG I, Lisa Roca, MD, the attending physician have personally viewed and interpreted all ECGs.  109 bpm.  Sinus tachycardia.  Low voltage EKG.  Likely normal axis.  Nonspecific ST and T wave ____________________________________________  RADIOLOGY All Xrays were viewed by me.  Imaging interpreted by Radiologist, and I, Lisa Roca, MD the attending physician have reviewed the radiologist interpretation noted below.  CT head without contrast:  IMPRESSION: Normal for age cerebral volume. Extensive hypoattenuation of white matter consistent with small vessel disease.  No acute intracranial findings. No significant change from prior CT from July 2015. __________________________________________  PROCEDURES  Procedure(s) performed: None  Critical Care performed:  None  ____________________________________________  No current facility-administered medications on file prior to encounter.    Current Outpatient Prescriptions on File Prior to Encounter  Medication Sig Dispense Refill  . amLODipine (NORVASC) 5 MG tablet Take 5 mg by mouth daily.    . Ascorbic Acid (VITAMIN C) 1000 MG tablet Take by mouth.    [name] aspirin EC 81 MG tablet Take 81 mg by mouth.    . Calcium 600-200 MG-UNIT tablet Take 1 tablet by mouth daily.    . Calcium Carb-Cholecalciferol (CALCIUM CARBONATE-VITAMIN D3 PO) Take by mouth.    . calcium carbonate (CALCIUM 600) 600 MG TABS tablet Take by mouth.    . carvedilol (COREG) 6.25 MG tablet Take 6.25 mg by mouth 2 (two) times daily with a meal.    . cefUROXime (CEFTIN) 250 MG tablet Take by mouth.    . cephALEXin (KEFLEX) 500 MG capsule Take 500 mg by mouth 4 (four) times daily.    . diclofenac sodium (VOLTAREN) 1 % GEL Apply topically.    [name] escitalopram (LEXAPRO) 5 MG tablet Take 5 mg by mouth daily.    . fentaNYL (DURAGESIC - DOSED MCG/HR) 25 MCG/HR patch Place onto the skin.    [name] HYDROcodone-acetaminophen (NORCO) 10-325 MG tablet Take 1 tablet by mouth 3 (three) times daily.     [name] LORazepam (ATIVAN) 0.5 MG tablet Take 0.5 mg by mouth 2 (two) times daily.     . meloxicam (MOBIC) 7.5 MG tablet Take 7.5 mg by mouth daily as needed for pain.     . mirtazapine (REMERON) 30 MG tablet Take 30 mg by mouth at bedtime.    . Multiple Vitamin (MULTIVITAMIN) tablet Take 1 tablet by mouth daily.    . Multiple Vitamins-Minerals (PRESERVISION AREDS 2) CAPS Take 1 capsule by mouth 2 (two) times daily.    . nitrofurantoin (MACRODANTIN) 50 MG capsule Take by mouth.    . RaNITidine HCl (RANITIDINE 75 PO) Take by mouth.    . tamsulosin (FLOMAX) 0.4 MG CAPS capsule Take 1 capsule (0.4 mg total) by mouth daily. 30 capsule 1    ____________________________________________  ED COURSE / ASSESSMENT AND PLAN  Pertinent labs & imaging results that were  available during my care of the patient were reviewed by me  and considered in my medical decision making (see chart for details).   Ms. Glasco is here and seems very highly anxious and is hard of hearing, but is able to tell me that she just feels bad all over and she has a bit of a mild headache which is different from previous.  Her daughter does show up later and states that she did have a history of migraines in the past.  Patient took 1 of her chronic pain medications and feels somewhat better although now she is complaining about soreness in her belly where she has an indolent Foley catheter from a UTI and urinary retention.  She has an appoint with a urologist on Tuesday to have this rechecked.  We did collect a urine sample, there is no indication of ongoing urinary infection.  Laboratory studies are reassuring.  I am not suspicious of ongoing or missed infection.  I gave her fluids in case of mild dehydration.  Her heart rate was just over 100, and she is very very anxious.  Her daughter states that she feels like the patient's anxiety is really contributing to all of her complaints throughout the day.  I do think this is probably significantly contributing as well.  In case she is okay for discharge from the emergency department, and advised him that she follow-up with primary care doctor.  DIFFERENTIAL DIAGNOSIS: Differential diagnosis includes, but is not limited to, intracranial hemorrhage, meningitis/encephalitis, previous head trauma, cavernous venous thrombosis, tension headache, temporal arteritis, migraine or migraine equivalent, idiopathic intracranial hypertension, and non-specific headache.  CONSULTATIONS:  None   Patient / Family / Caregiver informed of clinical course, medical decision-making process, and agree with plan.   I discussed return precautions, follow-up instructions, and discharge instructions with patient and/or family.  Discharge Instructions : You are  evaluated for an body aches, and although no certain cause was found, your exam and evaluation are overall reassuring in the emergency department today.  We discussed the contribution of your significant anxiety, and I recommend that you follow-up with your primary care doctor and continued to address the underlying anxiety to help with your overall experience.  Return to the emergency room immediately for any new or worsening condition including confusion altered mental status, fever, new or worsening headache, chest pain, abdominal pain, or any other symptoms concerning to you.  ___________________________________________   FINAL CLINICAL IMPRESSION(S) / ED DIAGNOSES   Final diagnoses:  Anxiety  Nonintractable headache, unspecified chronicity pattern, unspecified headache type              Note: This dictation was prepared with Dragon dictation. Any transcriptional errors that result from this process are unintentional    Lisa Roca, MD 07/31/17 1421

## 2017-07-31 NOTE — ED Triage Notes (Signed)
Pt came to ED via EMS from home c/o headache starting today. Seen here earlier this week for fever and chills, diagnosed with UTI, on last day of antibiotics and has catheter placed. Will come out this week per daughters. Draining clear urine, 849ml emptied prior to arrival.

## 2017-07-31 NOTE — Discharge Instructions (Signed)
You are evaluated for an body aches, and although no certain cause was found, your exam and evaluation are overall reassuring in the emergency department today.  We discussed the contribution of your significant anxiety, and I recommend that you follow-up with your primary care doctor and continued to address the underlying anxiety to help with your overall experience.  Return to the emergency room immediately for any new or worsening condition including confusion altered mental status, fever, new or worsening headache, chest pain, abdominal pain, or any other symptoms concerning to you.

## 2017-07-31 NOTE — ED Notes (Signed)
Catheter emptied

## 2017-08-01 ENCOUNTER — Telehealth: Payer: Self-pay

## 2017-08-01 NOTE — Telephone Encounter (Signed)
Pt called stating her foley is not draining properly. Pt stated that she was in the ER yesterday for a migraine and they checked her foley then. Pt stated that she was getting IV fluids and urine was just pouring into her bag. Pt then stated that she has not drank much since she has been home and only about 2 inches of urine has gone into her bag. Reinforced with pt to increase her fluid intake and then her output will increase. Pt voiced understanding.

## 2017-08-02 ENCOUNTER — Telehealth: Payer: Self-pay

## 2017-08-02 ENCOUNTER — Ambulatory Visit: Payer: Medicare Other

## 2017-08-02 NOTE — Telephone Encounter (Signed)
Pt called stating "My catheter didn't drain at all yesterday and today its filling up faster than I can empty it. Im so weak now and im just not able to come in this morning." Pt daughter was in the background yelling at the pt "Momma if you dont go this morning then im not taking you at all! Momma I have my own things to do without you!" Pt at this point became very tearful and upset stating "I am so sorry I cant come this morning. I am trying to get dressed and im just having a hard time. And my daughter cant get off any other day to bring me." Made pt aware if she was not able to come this morning then she would have to come another day as she is supposed to have a voiding trial. Pt inquired about coming tomorrow. Made pt aware that would be ok. Pt daughter again in the back ground was yelling at pt stating she was not going to bring her tomorrow and that she would need to get someone else to bring her. Pt again became very upset and starting crying again. Reinforced with pt its ok that shes not able to come today and that we would be able to work with her but her appt had to be done in the morning time no matter what day she comes in. Pt voiced understanding stating she would take the appt for tomorrow and see if she can get someone to bring her.

## 2017-08-03 ENCOUNTER — Ambulatory Visit (INDEPENDENT_AMBULATORY_CARE_PROVIDER_SITE_OTHER): Payer: Medicare Other

## 2017-08-03 ENCOUNTER — Telehealth: Payer: Self-pay | Admitting: Urology

## 2017-08-03 VITALS — BP 106/73 | HR 88 | Ht 61.0 in | Wt 125.0 lb

## 2017-08-03 DIAGNOSIS — R339 Retention of urine, unspecified: Secondary | ICD-10-CM | POA: Diagnosis not present

## 2017-08-03 NOTE — Progress Notes (Signed)
Fill and Pull Catheter Removal  Patient is present today for a catheter removal.  Patient was cleaned and prepped in a sterile fashion 110ml of sterile water/ saline was instilled into the bladder when the patient felt the urge to urinate. 23ml of water was then drained from the balloon.  A 14FR foley cath was removed from the bladder no complications were noted .  Patient as then given some time to void on their own.  Patient cannot void on their own after some time.  Patient tolerated well.  Preformed by: Toniann Fail, LPN   Follow up/ Additional notes: After pt got up and got dressed pt stated that she needed to pee. A hat was given and pt urinated out 150cc. Reinforced with daughter pt should still RTC today at 3pm due to pt hx of retention. Daughter voiced understanding. Pt also c/o being extremely weak, dry mouth, and seeing spots. Noted pt to have severe anxiety. Daughter stated that a coworker of her recommended pedilyte for pt. Daughter stated that she was going to give pt some today to see if it helps. Made Larene Beach aware of pt c/o and daughters opinion.   Blood pressure 106/73, pulse 88, height 5\' 1"  (1.549 m), weight 125 lb (56.7 kg).

## 2017-08-03 NOTE — Telephone Encounter (Signed)
Pt daughter called office stating that mother was seen by [facility] earlier this a.m. And was supposed to come back @3  if her mother wasn't able to urinate on her own. Daughter states mom is urinating on her own and doing fine and doesn't need to come back at 68, Juluis Rainier - thanks!

## 2017-08-04 ENCOUNTER — Inpatient Hospital Stay
Admission: EM | Admit: 2017-08-04 | Discharge: 2017-08-07 | DRG: 689 | Disposition: A | Discharge status: Home / Self Care | Payer: Medicare Other | Attending: Internal Medicine | Admitting: Internal Medicine

## 2017-08-04 ENCOUNTER — Encounter: Payer: Self-pay | Admitting: Emergency Medicine

## 2017-08-04 ENCOUNTER — Inpatient Hospital Stay: Payer: Medicare Other

## 2017-08-04 DIAGNOSIS — B9620 Unspecified Escherichia coli [E. coli] as the cause of diseases classified elsewhere: Secondary | ICD-10-CM | POA: Diagnosis present

## 2017-08-04 DIAGNOSIS — G9340 Encephalopathy, unspecified: Secondary | ICD-10-CM | POA: Diagnosis present

## 2017-08-04 DIAGNOSIS — E860 Dehydration: Secondary | ICD-10-CM | POA: Diagnosis present

## 2017-08-04 DIAGNOSIS — N3000 Acute cystitis without hematuria: Secondary | ICD-10-CM | POA: Diagnosis not present

## 2017-08-04 DIAGNOSIS — Z881 Allergy status to other antibiotic agents status: Secondary | ICD-10-CM

## 2017-08-04 DIAGNOSIS — Z880 Allergy status to penicillin: Secondary | ICD-10-CM

## 2017-08-04 DIAGNOSIS — E785 Hyperlipidemia, unspecified: Secondary | ICD-10-CM | POA: Diagnosis present

## 2017-08-04 DIAGNOSIS — R410 Disorientation, unspecified: Secondary | ICD-10-CM | POA: Diagnosis not present

## 2017-08-04 DIAGNOSIS — H9190 Unspecified hearing loss, unspecified ear: Secondary | ICD-10-CM | POA: Diagnosis present

## 2017-08-04 DIAGNOSIS — F329 Major depressive disorder, single episode, unspecified: Secondary | ICD-10-CM | POA: Diagnosis present

## 2017-08-04 DIAGNOSIS — H3530 Unspecified macular degeneration: Secondary | ICD-10-CM | POA: Diagnosis present

## 2017-08-04 DIAGNOSIS — M4800 Spinal stenosis, site unspecified: Secondary | ICD-10-CM | POA: Diagnosis present

## 2017-08-04 DIAGNOSIS — R531 Weakness: Secondary | ICD-10-CM | POA: Diagnosis not present

## 2017-08-04 DIAGNOSIS — Z888 Allergy status to other drugs, medicaments and biological substances status: Secondary | ICD-10-CM | POA: Diagnosis not present

## 2017-08-04 DIAGNOSIS — Z882 Allergy status to sulfonamides status: Secondary | ICD-10-CM | POA: Diagnosis not present

## 2017-08-04 DIAGNOSIS — E871 Hypo-osmolality and hyponatremia: Secondary | ICD-10-CM | POA: Diagnosis present

## 2017-08-04 DIAGNOSIS — I10 Essential (primary) hypertension: Secondary | ICD-10-CM | POA: Diagnosis present

## 2017-08-04 DIAGNOSIS — G9341 Metabolic encephalopathy: Secondary | ICD-10-CM | POA: Diagnosis present

## 2017-08-04 DIAGNOSIS — R339 Retention of urine, unspecified: Secondary | ICD-10-CM

## 2017-08-04 DIAGNOSIS — M79604 Pain in right leg: Secondary | ICD-10-CM | POA: Diagnosis present

## 2017-08-04 DIAGNOSIS — Z885 Allergy status to narcotic agent status: Secondary | ICD-10-CM

## 2017-08-04 DIAGNOSIS — N390 Urinary tract infection, site not specified: Secondary | ICD-10-CM | POA: Diagnosis not present

## 2017-08-04 DIAGNOSIS — N2889 Other specified disorders of kidney and ureter: Secondary | ICD-10-CM | POA: Diagnosis not present

## 2017-08-04 DIAGNOSIS — R109 Unspecified abdominal pain: Secondary | ICD-10-CM | POA: Diagnosis present

## 2017-08-04 LAB — URINALYSIS, COMPLETE (UACMP) WITH MICROSCOPIC
BILIRUBIN URINE: NEGATIVE
Glucose, UA: NEGATIVE mg/dL
HGB URINE DIPSTICK: NEGATIVE
Ketones, ur: NEGATIVE mg/dL
Nitrite: NEGATIVE
PROTEIN: NEGATIVE mg/dL
RBC / HPF: NONE SEEN RBC/hpf (ref 0–5)
SQUAMOUS EPITHELIAL / LPF: NONE SEEN
Specific Gravity, Urine: 1.003 — ABNORMAL LOW (ref 1.005–1.030)
pH: 8 (ref 5.0–8.0)

## 2017-08-04 LAB — CBC WITH DIFFERENTIAL/PLATELET
BASOS ABS: 0 10*3/uL (ref 0–0.1)
Basophils Relative: 1 %
EOS ABS: 0.1 10*3/uL (ref 0–0.7)
EOS PCT: 2 %
HCT: 41.1 % (ref 35.0–47.0)
Hemoglobin: 13.6 g/dL (ref 12.0–16.0)
LYMPHS PCT: 27 %
Lymphs Abs: 1.8 10*3/uL (ref 1.0–3.6)
MCH: 28.5 pg (ref 26.0–34.0)
MCHC: 33.1 g/dL (ref 32.0–36.0)
MCV: 86 fL (ref 80.0–100.0)
Monocytes Absolute: 0.7 10*3/uL (ref 0.2–0.9)
Monocytes Relative: 11 %
Neutro Abs: 4 10*3/uL (ref 1.4–6.5)
Neutrophils Relative %: 61 %
PLATELETS: 194 10*3/uL (ref 150–440)
RBC: 4.78 MIL/uL (ref 3.80–5.20)
RDW: 14.9 % — ABNORMAL HIGH (ref 11.5–14.5)
WBC: 6.7 10*3/uL (ref 3.6–11.0)

## 2017-08-04 LAB — COMPREHENSIVE METABOLIC PANEL
ALBUMIN: 4.1 g/dL (ref 3.5–5.0)
ALT: 18 U/L (ref 14–54)
AST: 30 U/L (ref 15–41)
Alkaline Phosphatase: 63 U/L (ref 38–126)
Anion gap: 11 (ref 5–15)
BUN: 13 mg/dL (ref 6–20)
CHLORIDE: 98 mmol/L — AB (ref 101–111)
CO2: 22 mmol/L (ref 22–32)
Calcium: 10 mg/dL (ref 8.9–10.3)
Creatinine, Ser: 0.76 mg/dL (ref 0.44–1.00)
GFR calc Af Amer: >60 mL/min (ref >60)
Glucose, Bld: 104 mg/dL — ABNORMAL HIGH (ref 65–99)
POTASSIUM: 4.4 mmol/L (ref 3.5–5.1)
SODIUM: 131 mmol/L — AB (ref 135–145)
Total Bilirubin: 1.2 mg/dL (ref 0.3–1.2)
Total Protein: 6.9 g/dL (ref 6.5–8.1)

## 2017-08-04 LAB — TROPONIN I

## 2017-08-04 MED ORDER — ACETAMINOPHEN 650 MG RE SUPP: 650.0000 mg | Freq: Four times a day (QID) | RECTAL | Status: DC | PRN

## 2017-08-04 MED ORDER — ACETAMINOPHEN 500 MG PO TABS
1000.0000 mg | ORAL_TABLET | Freq: Once | ORAL | Status: AC
  Administered 2017-08-04: 1000 mg via ORAL
  Filled 2017-08-04: qty 2

## 2017-08-04 MED ORDER — FENTANYL CITRATE (PF) 100 MCG/2ML IJ SOLN
25.0000 ug | Freq: Once | INTRAMUSCULAR | Status: AC
  Administered 2017-08-04: 25 ug via INTRAVENOUS

## 2017-08-04 MED ORDER — CEFTRIAXONE SODIUM IN DEXTROSE 20 MG/ML IV SOLN
1.0000 g | Freq: Once | INTRAVENOUS | Status: AC
  Administered 2017-08-04: 1 g via INTRAVENOUS
  Filled 2017-08-04: qty 50

## 2017-08-04 MED ORDER — ENOXAPARIN SODIUM 40 MG/0.4ML ~~LOC~~ SOLN
40.0000 mg | SUBCUTANEOUS | Status: DC
  Administered 2017-08-04 – 2017-08-06 (×3): 40 mg via SUBCUTANEOUS
  Filled 2017-08-04 (×3): qty 0.4

## 2017-08-04 MED ORDER — AMLODIPINE BESYLATE 5 MG PO TABS
5.0000 mg | ORAL_TABLET | Freq: Every day | ORAL | Status: DC
  Administered 2017-08-04 – 2017-08-06 (×3): 5 mg via ORAL
  Filled 2017-08-04 (×4): qty 1

## 2017-08-04 MED ORDER — SODIUM CHLORIDE 0.9 % IV SOLN
INTRAVENOUS | Status: DC
  Administered 2017-08-04 – 2017-08-05 (×3): via INTRAVENOUS

## 2017-08-04 MED ORDER — DEXTROSE 5 % IV SOLN
1.0000 g | INTRAVENOUS | Status: DC
  Administered 2017-08-05 – 2017-08-07 (×3): 1 g via INTRAVENOUS
  Filled 2017-08-04 (×3): qty 10

## 2017-08-04 MED ORDER — ONDANSETRON HCL 4 MG/2ML IJ SOLN
4.0000 mg | Freq: Four times a day (QID) | INTRAMUSCULAR | Status: DC | PRN
  Administered 2017-08-05: 4 mg via INTRAVENOUS
  Filled 2017-08-04: qty 2

## 2017-08-04 MED ORDER — SODIUM CHLORIDE 0.9 % IV BOLUS (SEPSIS)
1000.0000 mL | Freq: Once | INTRAVENOUS | Status: AC
  Administered 2017-08-04: 1000 mL via INTRAVENOUS

## 2017-08-04 MED ORDER — ACETAMINOPHEN 325 MG PO TABS: 650.0000 mg | ORAL_TABLET | Freq: Four times a day (QID) | ORAL | Status: DC | PRN

## 2017-08-04 MED ORDER — LORAZEPAM 0.5 MG PO TABS
0.5000 mg | ORAL_TABLET | Freq: Two times a day (BID) | ORAL | Status: DC
  Administered 2017-08-04: 0.5 mg via ORAL
  Filled 2017-08-04: qty 1

## 2017-08-04 MED ORDER — FENTANYL CITRATE (PF) 100 MCG/2ML IJ SOLN
25.0000 ug | Freq: Once | INTRAMUSCULAR | Status: AC
  Administered 2017-08-04: 25 ug via INTRAVENOUS
  Filled 2017-08-04: qty 2

## 2017-08-04 MED ORDER — BISACODYL 5 MG PO TBEC
5.0000 mg | DELAYED_RELEASE_TABLET | Freq: Every day | ORAL | Status: DC | PRN
  Administered 2017-08-05: 5 mg via ORAL
  Filled 2017-08-04: qty 1

## 2017-08-04 MED ORDER — MIRTAZAPINE 15 MG PO TABS
30.0000 mg | ORAL_TABLET | Freq: Every day | ORAL | Status: DC
  Administered 2017-08-04 – 2017-08-06 (×3): 30 mg via ORAL
  Filled 2017-08-04 (×3): qty 2

## 2017-08-04 MED ORDER — IOPAMIDOL (ISOVUE-300) INJECTION 61%
100.0000 mL | Freq: Once | INTRAVENOUS | Status: AC | PRN
Start: 2017-08-04 — End: 2017-08-04
  Administered 2017-08-04: 100 mL via INTRAVENOUS

## 2017-08-04 MED ORDER — PHENAZOPYRIDINE HCL 200 MG PO TABS
200.0000 mg | ORAL_TABLET | Freq: Once | ORAL | Status: AC
  Administered 2017-08-04: 200 mg via ORAL
  Filled 2017-08-04: qty 1

## 2017-08-04 MED ORDER — PHENAZOPYRIDINE HCL 100 MG PO TABS
100.0000 mg | ORAL_TABLET | Freq: Three times a day (TID) | ORAL | Status: AC
  Administered 2017-08-05 – 2017-08-06 (×5): 100 mg via ORAL
  Filled 2017-08-04 (×6): qty 1

## 2017-08-04 MED ORDER — HALOPERIDOL LACTATE 5 MG/ML IJ SOLN
0.5000 mg | Freq: Four times a day (QID) | INTRAMUSCULAR | Status: DC | PRN
  Administered 2017-08-07: 0.5 mg via INTRAVENOUS
  Filled 2017-08-04 (×2): qty 0.1

## 2017-08-04 MED ORDER — HYDROCODONE-ACETAMINOPHEN 5-325 MG PO TABS
1.0000 | ORAL_TABLET | ORAL | Status: DC | PRN
  Administered 2017-08-04: 1 via ORAL
  Administered 2017-08-05: 2 via ORAL
  Filled 2017-08-04: qty 2
  Filled 2017-08-04: qty 1

## 2017-08-04 MED ORDER — SENNOSIDES-DOCUSATE SODIUM 8.6-50 MG PO TABS
1.0000 | ORAL_TABLET | Freq: Every evening | ORAL | Status: DC | PRN
  Administered 2017-08-05: 1 via ORAL
  Filled 2017-08-04: qty 1

## 2017-08-04 MED ORDER — TAMSULOSIN HCL 0.4 MG PO CAPS
0.4000 mg | ORAL_CAPSULE | Freq: Every day | ORAL | Status: DC
  Administered 2017-08-04 – 2017-08-06 (×3): 0.4 mg via ORAL
  Filled 2017-08-04 (×4): qty 1

## 2017-08-04 MED ORDER — HYDROCODONE-ACETAMINOPHEN 10-325 MG PO TABS
1.0000 | ORAL_TABLET | Freq: Three times a day (TID) | ORAL | Status: DC
  Administered 2017-08-05: 1 via ORAL
  Filled 2017-08-04: qty 1

## 2017-08-04 MED ORDER — ONDANSETRON HCL 4 MG PO TABS: 4.0000 mg | ORAL_TABLET | Freq: Four times a day (QID) | ORAL | Status: DC | PRN

## 2017-08-04 MED ORDER — CARVEDILOL 6.25 MG PO TABS
6.2500 mg | ORAL_TABLET | Freq: Two times a day (BID) | ORAL | Status: DC
  Administered 2017-08-04 – 2017-08-07 (×5): 6.25 mg via ORAL
  Filled 2017-08-04 (×5): qty 1

## 2017-08-04 NOTE — ED Notes (Signed)
Admission MD at bedside.  

## 2017-08-04 NOTE — ED Notes (Signed)
Pts bladder scanned after using the bathroom and pt still had 914cc of urine in her bladder.

## 2017-08-04 NOTE — Progress Notes (Signed)
Pharmacy Antibiotic Note  [name] is a 83 y.o. female admitted on 08/04/2017 with UTI.  Pharmacy has been consulted for Ceftriaxone dosing.  Plan: Patient received Ceftriaxone x 1 in ER. Will continue with Ceftriaxone 1 gram IV q24h.    Height: 5\' 1"  (154.9 cm) Weight: 125 lb (56.7 kg) IBW/kg (Calculated) : 47.8  Temp (24hrs), Avg:98 F (36.7 C), Min:97.8 F (36.6 C), Max:98.2 F (36.8 C)   Recent Labs Lab 07/31/17 0936 08/04/17 1130  WBC 7.1 6.7  CREATININE 0.60 0.76    Estimated Creatinine Clearance: 36 mL/min (by C-G formula based on SCr of 0.76 mg/dL).    Allergies  Allergen Reactions  . Ceftin [Cefuroxime]   . Cortisone Other (See Comments)    Headache   . Fluticasone Swelling    Facial  . Levofloxacin Nausea And Vomiting  . Meperidine Other (See Comments)  . Morphine And Related Other (See Comments)    headache  . Morphine Sulfate   . Nisoldipine   . Penicillin G Potassium In D5w   . Prednisone Other (See Comments)    Headache   . Sulfa Antibiotics Nausea Only    Antimicrobials this admission: CTX 10/25 >>       >>    Dose adjustments this admission:    Microbiology results:   BCx:   10/25 UCx: pending    Sputum:      MRSA PCR:    Thank you for allowing pharmacy to be a part of this patient's care.  Izan Miron A 08/04/2017 5:05 PM

## 2017-08-04 NOTE — ED Provider Notes (Signed)
[facility] Emergency Department Provider Note  ____________________________________________  Time seen: Approximately 11:33 AM  I have reviewed the triage vital signs and the nursing notes.   HISTORY  Chief Complaint Near Syncope; Altered Mental Status; and Weakness   HPI [name] is a 83 y.o. female with a history of chronic back pain on opiates, hyperlipidemia, urinary retention, hypertension, hyponatremia, macular degeneration who presents for evaluation of confusion and weakness. Patient was found to have a urinary tract infection 10 days ago and had a Foley catheter placed. After patient went home with the Foley 10 days ago she was sitting in the toilet and had a brief syncopal episode with no trauma. She was not seen at that time. Yesterday she went to urology and the Foley was removed. She is no longer on antibiotics. Since she was found to have a UTI patient has been very confused. She is complaining of seeing visual floaters. She has been progressively more weak. She lives with her daughter and today she has been unable to get up from the bed or ambulate. She has been drinking and eating normally at home. According to the daughters patient has had similar presentation in the past in the setting of hyponatremia. Patient denies headache, chest pain, shortness of breath, cough, abdominal pain, dysuria, nausea, vomiting, diarrhea, constipation. She is complaining of left shoulder pain and low back pain which are chronic for her and unchanged from baseline.  Past Medical History:  Diagnosis Date  . Anxiety   . Chronic back pain   . High cholesterol   . History of urinary retention   . Hypertension   . Pneumonia     Patient Active Problem List   Diagnosis Date Noted  . Hyponatremia 12/15/2015    Past Surgical History:  Procedure Laterality Date  . ABDOMINAL HYSTERECTOMY    . APPENDECTOMY    . BACK SURGERY    . BREAST BIOPSY Right 2002   neg  . TONSILLECTOMY      Prior to Admission medications   Medication Sig Start Date End Date Taking? Authorizing Provider  amLODipine (NORVASC) 5 MG tablet Take 5 mg by mouth daily.   Yes [provider]  calcium carbonate (CALCIUM 600) 600 MG TABS tablet Take by mouth.   Yes [provider]  carvedilol (COREG) 6.25 MG tablet Take 6.25 mg by mouth 2 (two) times daily with a meal.   Yes [provider]  HYDROcodone-acetaminophen (NORCO) 10-325 MG tablet Take 1 tablet by mouth 3 (three) times daily.    Yes [provider]  LORazepam (ATIVAN) 0.5 MG tablet Take 0.5 mg by mouth 2 (two) times daily.    Yes [provider]  meloxicam (MOBIC) 7.5 MG tablet Take 7.5 mg by mouth daily as needed for pain.    Yes [provider]  mirtazapine (REMERON) 30 MG tablet Take 30 mg by mouth at bedtime.   Yes [provider]  tamsulosin (FLOMAX) 0.4 MG CAPS capsule Take 1 capsule (0.4 mg total) by mouth daily. 06/22/17  Yes Festus Aloe, MD    Allergies Ceftin [cefuroxime]; Cortisone; Fluticasone; Levofloxacin; Meperidine; Morphine and related; Morphine sulfate; Nisoldipine; Penicillin g potassium in d5w; Prednisone; and Sulfa antibiotics  Family History  Problem Relation Age of Onset  . Breast cancer Sister 20  . Breast cancer Maternal Grandmother 36  . Breast cancer Sister 19  . Bladder Cancer Brother   . Hypertension Neg Hx   . Kidney cancer Neg  Hx     Social History Social History  Substance Use Topics  . Smoking status: Never Smoker  . Smokeless tobacco: Never Used  . Alcohol use No    Review of Systems  Constitutional: Negative for fever. + generalized weakness and confusion Eyes: Negative for visual changes. + visual floaters ENT: Negative for sore throat. Neck: No neck pain  Cardiovascular: Negative for chest pain. Respiratory: Negative for shortness of breath. Gastrointestinal: Negative for abdominal pain, vomiting  or diarrhea. Genitourinary: Negative for dysuria. Musculoskeletal: Negative for back pain. Skin: Negative for rash. Neurological: Negative for headaches, weakness or numbness. Psych: No SI or HI  ____________________________________________   PHYSICAL EXAM:  VITAL SIGNS: ED Triage Vitals  Enc Vitals Group     BP 08/04/17 1040 (!) 135/91     Pulse Rate 08/04/17 1040 (!) 103     Resp 08/04/17 1040 18     Temp 08/04/17 1040 98.2 F (36.8 C)     Temp Source 08/04/17 1040 Oral     SpO2 08/04/17 1040 96 %     Weight 08/04/17 1036 125 lb (56.7 kg)     Height 08/04/17 1036 5\' 1"  (1.549 m)     Head Circumference --      Peak Flow --      Pain Score --      Pain Loc --      Pain Edu? --      Excl. in [place]? --     Constitutional: Alert and oriented x2. Well appearing and in no apparent distress. HEENT:      Head: Normocephalic and atraumatic.         Eyes: Conjunctivae are normal. Sclera is non-icteric. Unable to abduct L eye which is baseline. Unable to see fingers, able to see light (baseline)      Mouth/Throat: Mucous membranes are dry.       Neck: Supple with no signs of meningismus. Cardiovascular: tachycardic with regular rhythm. No murmurs, gallops, or rubs. 2+ symmetrical distal pulses are present in all extremities. No JVD. Respiratory: Normal respiratory effort. Lungs are clear to auscultation bilaterally. No wheezes, crackles, or rhonchi.  Gastrointestinal: Soft, non tender, and non distended with positive bowel sounds. No rebound or guarding. Musculoskeletal: Nontender with normal range of motion in all extremities. No edema, cyanosis, or erythema of extremities. Neurologic: Normal speech and language. Face is symmetric. Moving all extremities. intact strength and sensation in all 4 extremities.No gross focal neurologic deficits are appreciated. Skin: Skin is warm, dry and intact. No rash noted. Psychiatric: Mood and affect are normal. Speech and behavior are  normal.  ____________________________________________   LABS (all labs ordered are listed, but only abnormal results are displayed)  Labs Reviewed  COMPREHENSIVE METABOLIC PANEL - Abnormal; Notable for the following:       Result Value   Sodium 131 (*)    Chloride 98 (*)    Glucose, Bld 104 (*)    All other components within normal limits  URINALYSIS, COMPLETE (UACMP) WITH MICROSCOPIC - Abnormal; Notable for the following:    Color, Urine STRAW (*)    APPearance CLEAR (*)    Specific Gravity, Urine 1.003 (*)    Leukocytes, UA TRACE (*)    Bacteria, UA RARE (*)    All other components within normal limits  CBC WITH DIFFERENTIAL/PLATELET - Abnormal; Notable for the following:    RDW 14.9 (*)    All other components within normal limits  URINE CULTURE  TROPONIN I  CBG MONITORING, ED   ____________________________________________  EKG  ED ECG REPORT I, Rudene Re, the attending physician, personally viewed and interpreted this ECG.  Sinus tachycardia, first-degree AV block, normal QRS and QTc intervals, normal axis, no ST elevations or depressions, Q waves and anterior leads. Unchanged from prior ____________________________________________  RADIOLOGY  none  ____________________________________________   PROCEDURES  Procedure(s) performed: None Procedures Critical Care performed:  None ____________________________________________   INITIAL IMPRESSION / ASSESSMENT AND PLAN / ED COURSE  83 y.o. female with a history of chronic back pain on opiates, hyperlipidemia, urinary retention, hypertension, hyponatremia, macular degeneration who presents for evaluation of progressively worsening confusion and weakness x 10 days. patient's symptoms are probably due to a combination of different etiologies including chronic opiates, recent UTI and Foley catheter. We'll check labs, UA to evaluate for any signs of infection, electrolyte abnormalities, anemia, dehydration,  uremia. We'll give IV fluids. We'll check EKG and troponin for any signs of ischemia.  Clinical Course as of Aug 04 1421  Thu Aug 04, 2017  1236 patient complaining of dysuria, UA with trace leuks, 6-30 wbc's and rare bacteria. Urine culture has been sent however since patient is symptomatic I will start treating her with Rocephin. Labs otherwise are pending.  [CV]    Clinical Course User Index [CV] Alfred Levins [place], MD   _________________________ 2:20 PM on 08/04/2017 -----------------------------------------  Patient retaining urine >999cc. Foley will be placed again. Patient becoming more confused and agitated in the ED. She lives with her elderly daughter who does not feel comfortable caring for patient at home with weakness, inability to walk, and confusion. Patient will be admitted to the Hospitalist service.  As part of my medical decision making, I reviewed the following data within the [place] History obtained from family, Nursing notes reviewed and incorporated, Labs reviewed , EKG interpreted , Old EKG reviewed, Old chart reviewed, Discussed with admitting physician , Notes from prior ED visits and [place] Controlled Substance Database    Pertinent labs & imaging results that were available during my care of the patient were reviewed by me and considered in my medical decision making (see chart for details).    ____________________________________________   FINAL CLINICAL IMPRESSION(S) / ED DIAGNOSES  Final diagnoses:  Acute cystitis without hematuria  Urinary retention  Confusion  Weakness  Encephalopathy acute      NEW MEDICATIONS STARTED DURING THIS VISIT:  New Prescriptions   No medications on file     Note:  This document was prepared using Dragon voice recognition software and may include unintentional dictation errors.    Rudene Re, MD 08/04/17 [phone]

## 2017-08-04 NOTE — ED Triage Notes (Signed)
Triage note completed under NT profile. This Rn completed the triage.

## 2017-08-04 NOTE — ED Triage Notes (Signed)
Pt here with family. Pt family reports pt has a catheter in for about a week, had it taken out Tuesday last week and has been altered since. Pt family reports that she has been weak with some syncopal episode in which they called EMS. Pt c/o pain to her neck. Pt leaning back in chair but answers questions when asked.

## 2017-08-04 NOTE — H&P (Addendum)
[facility] NAME: [name]    MR#:  [id number]  DATE OF BIRTH:  1925/04/22  DATE OF ADMISSION:  08/04/2017  PRIMARY CARE PHYSICIAN: Lavera Guise, MD   REQUESTING/REFERRING PHYSICIAN: dr Alfred Levins  CHIEF COMPLAINT:   confusion HISTORY OF PRESENT ILLNESS:  [name]  is a 83 y.o. female with a known history of macular degeneration, spinal stenosis with ongoing right leg pain and sensitivity and urinary retention who presents due to altered mental status. Family is at bedside who reports that over the past week she has had increasing confusion and hallucinations. She was recently diagnosed with urinary retention and had a Foley catheter placed. This was removed yesterday. She presented today due to weakness and confusion. At her baseline patient has no confusion. Patient is now complaining of abdominal pain and dysuria. She has found to have a urinary tract infection and has been started on Rocephin. She also had greater than 900 cc in her bladder and a Foley catheter has been placed.   PAST MEDICAL HISTORY:   Past Medical History:  Diagnosis Date  . Anxiety   . Chronic back pain   . High cholesterol   . History of urinary retention   . Hypertension   . Pneumonia     PAST SURGICAL HISTORY:   Past Surgical History:  Procedure Laterality Date  . ABDOMINAL HYSTERECTOMY    . APPENDECTOMY    . BACK SURGERY    . BREAST BIOPSY Right 2002   neg  . TONSILLECTOMY      SOCIAL HISTORY:   Social History  Substance Use Topics  . Smoking status: Never Smoker  . Smokeless tobacco: Never Used  . Alcohol use No    FAMILY HISTORY:   Family History  Problem Relation Age of Onset  . Breast cancer Sister 65  . Breast cancer Maternal Grandmother 75  . Breast cancer Sister 28  . Bladder Cancer Brother   . Hypertension Neg Hx   . Kidney cancer Neg Hx     DRUG ALLERGIES:   Allergies  Allergen Reactions  .  Ceftin [Cefuroxime]   . Cortisone Other (See Comments)    Headache   . Fluticasone Swelling    Facial  . Levofloxacin Nausea And Vomiting  . Meperidine Other (See Comments)  . Morphine And Related Other (See Comments)    headache  . Morphine Sulfate   . Nisoldipine   . Penicillin G Potassium In D5w   . Prednisone Other (See Comments)    Headache   . Sulfa Antibiotics Nausea Only    REVIEW OF SYSTEMS:   Review of Systems  Constitutional: Negative.  Negative for chills, fever and malaise/fatigue.  HENT: Negative.  Negative for ear discharge, ear pain, hearing loss, nosebleeds and sore throat.   Eyes: Negative.  Negative for blurred vision and pain.  Respiratory: Negative.  Negative for cough, hemoptysis, shortness of breath and wheezing.   Cardiovascular: Negative.  Negative for chest pain, palpitations and leg swelling.  Gastrointestinal: Positive for abdominal pain. Negative for blood in stool, diarrhea, nausea and vomiting.  Genitourinary: Positive for dysuria, flank pain and frequency.  Musculoskeletal: Negative for back pain.  Skin: Negative.   Neurological: Negative for dizziness, tremors, speech change, focal weakness, seizures and headaches.  Endo/Heme/Allergies: Negative.  Does not bruise/bleed easily.  Psychiatric/Behavioral: Positive for hallucinations. Negative for depression and suicidal ideas.    MEDICATIONS AT HOME:   Prior to Admission medications  Medication Sig Start Date End Date Taking? Authorizing Provider  amLODipine (NORVASC) 5 MG tablet Take 5 mg by mouth daily.   Yes [provider]  calcium carbonate (CALCIUM 600) 600 MG TABS tablet Take by mouth.   Yes [provider]  carvedilol (COREG) 6.25 MG tablet Take 6.25 mg by mouth 2 (two) times daily with a meal.   Yes [provider]  HYDROcodone-acetaminophen (NORCO) 10-325 MG tablet Take 1 tablet by mouth 3 (three) times daily.    Yes [provider]  LORazepam  (ATIVAN) 0.5 MG tablet Take 0.5 mg by mouth 2 (two) times daily.    Yes [provider]  meloxicam (MOBIC) 7.5 MG tablet Take 7.5 mg by mouth daily as needed for pain.    Yes [provider]  mirtazapine (REMERON) 30 MG tablet Take 30 mg by mouth at bedtime.   Yes [provider]  tamsulosin (FLOMAX) 0.4 MG CAPS capsule Take 1 capsule (0.4 mg total) by mouth daily. 06/22/17  Yes Festus Aloe, MD      VITAL SIGNS:  Blood pressure (!) 112/91, pulse 99, temperature 98.2 F (36.8 C), temperature source Oral, resp. rate 18, height 5\' 1"  (1.549 m), weight 56.7 kg (125 lb), SpO2 95 %.  PHYSICAL EXAMINATION:   Physical Exam  Constitutional: She is well-developed, well-nourished, and in no distress. No distress.  HENT:  Head: Normocephalic.  Eyes: No scleral icterus.  Neck: Normal range of motion. Neck supple. No JVD present. No tracheal deviation present.  Cardiovascular: Normal rate, regular rhythm and normal heart sounds.  Exam reveals no gallop and no friction rub.   No murmur heard. Pulmonary/Chest: Effort normal and breath sounds normal. No respiratory distress. She has no wheezes. She has no rales. She exhibits no tenderness.  Abdominal: Soft. Bowel sounds are normal. She exhibits no distension and no mass. There is tenderness. There is no rebound and no guarding.  Musculoskeletal: Normal range of motion. She exhibits no edema.  She has significant tenderness at the right leg which is at her baseline.  Neurological: She is alert.  Oriented to name and place not time  Skin: Skin is warm. No rash noted. No erythema.  Psychiatric:  Confused      LABORATORY PANEL:   CBC  Recent Labs Lab 08/04/17 1130  WBC 6.7  HGB 13.6  HCT 41.1  PLT 194   ------------------------------------------------------------------------------------------------------------------  Chemistries   Recent Labs Lab 08/04/17 1130  NA 131*  K 4.4  CL 98*  CO2 22   GLUCOSE 104*  BUN 13  CREATININE 0.76  CALCIUM 10.0  AST 30  ALT 18  ALKPHOS 63  BILITOT 1.2   ------------------------------------------------------------------------------------------------------------------  Cardiac Enzymes  Recent Labs Lab 08/04/17 1130  TROPONINI <0.03   ------------------------------------------------------------------------------------------------------------------  RADIOLOGY:  No results found.  EKG:   Sinus tachycardia first-degree AV block no ST elevation or depression. There are Q waves in the anterior leads.  IMPRESSION AND PLAN:   83 year old female with spinal stenosis and significant right leg tenderness, urinary retention with removal Foley catheter yesterday and macular degeneration who presents today due to confusion and underlies weakness.   1. Acute metabolic encephalopathy in the setting of urinary tract infection and mild hyponatremia  2. Urinary tract infection: Start Rocephin and follow up on urine culture I am suspecting this is related to her Foley catheter that was just removed yesterday. Start Pyridium to help alleviate symptoms. 3. Urinary retention with greater than 900 cc of urine in  her bladder today: I have spoke with UROLOGY on-call and they are recommending continuing Flomax and Foley catheter. They will see her as an outpatient upon discharge.  Continue Foley catheter care continue Flomax.  4. Hyponatremia: Continue IV fluids and repeat BMP in a.m.  5. Essential hypertension: Continue Norvasc and Coreg  6. Depression: Continue Remeron  7. Abdominal pain: I asked ED MD to order CT ABDOMEN.  All the records are reviewed and case discussed with ED provider. Management plans discussed with the patient and family and they are in agreement  CODE STATUS: FULL  TOTAL TIME TAKING CARE OF THIS PATIENT: 45 minutes.    Avel Ogawa M.D on 08/04/2017 at 3:41 PM  Between 7am to 6pm - Pager - [phone]  After 6pm  go to [email or web address] - password [email or web address] [facility]  Office  [phone]  CC: Primary care physician; Lavera Guise, MD

## 2017-08-04 NOTE — ED Notes (Signed)
Pt calling out with painful urination, EDP notified and new orders received.

## 2017-08-05 ENCOUNTER — Ambulatory Visit: Admission: RE | Admit: 2017-08-05 | Payer: Medicare Other | Source: Ambulatory Visit

## 2017-08-05 LAB — BASIC METABOLIC PANEL
Anion gap: 5 (ref 5–15)
BUN: 8 mg/dL (ref 6–20)
CHLORIDE: 103 mmol/L (ref 101–111)
CO2: 23 mmol/L (ref 22–32)
CREATININE: 0.49 mg/dL (ref 0.44–1.00)
Calcium: 8.9 mg/dL (ref 8.9–10.3)
GFR calc non Af Amer: >60 mL/min (ref >60)
GLUCOSE: 130 mg/dL — AB (ref 65–99)
Potassium: 3.1 mmol/L — ABNORMAL LOW (ref 3.5–5.1)
Sodium: 131 mmol/L — ABNORMAL LOW (ref 135–145)

## 2017-08-05 MED ORDER — ENSURE ENLIVE PO LIQD
237.0000 mL | Freq: Three times a day (TID) | ORAL | Status: DC
  Administered 2017-08-05 – 2017-08-07 (×4): 237 mL via ORAL

## 2017-08-05 MED ORDER — LORAZEPAM 0.5 MG PO TABS
0.5000 mg | ORAL_TABLET | Freq: Three times a day (TID) | ORAL | Status: DC
  Administered 2017-08-05 – 2017-08-07 (×7): 0.5 mg via ORAL
  Filled 2017-08-05 (×7): qty 1

## 2017-08-05 MED ORDER — HYDROCODONE-ACETAMINOPHEN 5-325 MG PO TABS
1.0000 | ORAL_TABLET | Freq: Four times a day (QID) | ORAL | Status: DC | PRN
  Filled 2017-08-05: qty 1

## 2017-08-05 MED ORDER — TROLAMINE SALICYLATE 10 % EX CREA
TOPICAL_CREAM | Freq: Three times a day (TID) | CUTANEOUS | Status: DC | PRN
  Administered 2017-08-05: 12:00:00 via TOPICAL
  Filled 2017-08-05: qty 85

## 2017-08-05 MED ORDER — HYDROCODONE-ACETAMINOPHEN 10-325 MG PO TABS
1.0000 | ORAL_TABLET | Freq: Four times a day (QID) | ORAL | Status: DC | PRN
  Administered 2017-08-05 – 2017-08-06 (×4): 1 via ORAL
  Filled 2017-08-05 (×3): qty 1
  Filled 2017-08-05: qty 2
  Filled 2017-08-05: qty 1

## 2017-08-05 MED ORDER — POTASSIUM CHLORIDE CRYS ER 20 MEQ PO TBCR
30.0000 meq | EXTENDED_RELEASE_TABLET | Freq: Two times a day (BID) | ORAL | Status: DC
  Administered 2017-08-05 – 2017-08-06 (×4): 30 meq via ORAL
  Filled 2017-08-05 (×4): qty 1

## 2017-08-05 NOTE — Progress Notes (Signed)
Physical Therapy Evaluation Patient Details Name: [name] MRN: [id number] DOB: Feb 26, 1925 Today's Date: 08/05/2017   History of Present Illness  Pt admitted for acute encephalopathy, due to UTI. Pt complaints of increased confusion and hallucinations this past week. Imaging on 10/25 shows L renal neoplasm, will need renal MRI. PMH of macular degeneration, spinal stenosis with ongoing R leg pain, urinary retention, HTN, hyponatremia and chronic LBP.  Clinical Impression  Pt is a pleasant 83 year old female admitted for acute encephalopathy. Pt appeared awake and alert this morning, able to answer questions and follow commands without difficulty. Pt performed supine there-ex, bed mobility with mod I., transfers with RW with supervision, and amb with RW with CGA. Pt able to amb 69ft x2, relies on RW for support, able to navigate RW around furniture with no assist. Pt demonstrates deficits with LE strength/endurance/amb. Would benefit from further PT services to address above deficits and promote optimal return to home. Recommend transition to [facility] upon DC from acute hospitalization.     Follow Up Recommendations Home health PT    Equipment Recommendations  None recommended by PT (Pt has RW)    Recommendations for Other Services       Precautions / Restrictions Precautions Precautions: Fall Restrictions Weight Bearing Restrictions: No      Mobility  Bed Mobility Overal bed mobility: Needs Assistance Bed Mobility: Supine to Sit     Supine to sit: Modified independent (Device/Increase time)     General bed mobility comments: Pt utilized bed rails, no assist needed  Transfers Overall transfer level: Needs assistance Equipment used: Rolling walker (2 wheeled) Transfers: Sit to/from Stand Sit to Stand: Supervision         General transfer comment: Cues to scoot to EOB to place feet on floor, able to transfer with proper technique. No cues needed.    Ambulation/Gait Ambulation/Gait assistance: Min guard Ambulation Distance (Feet): 80 Feet Assistive device: Rolling walker (2 wheeled) Gait Pattern/deviations: Step-through pattern     General Gait Details: Pt amb with alternating step-through gait, able to navigate RW around furniture and make turns easily, cues to keep RW closer to body. No assist needed.   Stairs            Wheelchair Mobility    Modified Rankin (Stroke Patients Only)       Balance Overall balance assessment: Needs assistance Sitting-balance support: Feet supported Sitting balance-Leahy Scale: Good Sitting balance - Comments: Pt able to sit at EOB with no assist   Standing balance support: Bilateral upper extremity supported Standing balance-Leahy Scale: Good Standing balance comment: Pt able to stand with RW, no cues, supervision                             Pertinent Vitals/Pain Pain Assessment: 0-10 Pain Score: 8  Pain Location: neck and legs Pain Descriptors / Indicators: Sore;Discomfort Pain Intervention(s): Limited activity within patient's tolerance;Monitored during session;Repositioned;Patient requesting pain meds-RN notified    Home Living Family/patient expects to be discharged to:: Private residence Living Arrangements: Children Available Help at Discharge: Family Type of Home: House Home Access: Level entry     Home Layout: One level Home Equipment: Environmental consultant - 2 wheels Additional Comments: Daughters work during the day, available to check in with patient during lunchtime if needed.     Prior Function Level of Independence: Needs assistance         Comments: Pt states she is able to  perform ADLs without assistance, says she uses a RW for amb but does not like using it. Pt reports being able to amb independently in the home.      Hand Dominance        Extremity/Trunk Assessment   Upper Extremity Assessment Upper Extremity Assessment: Generalized weakness  (UE MMT grossly 3+/5)    Lower Extremity Assessment Lower Extremity Assessment: Generalized weakness (LE MMT grossly 3+/5 )       Communication   Communication: HOH  Cognition Arousal/Alertness: Awake/alert Behavior During Therapy: WFL for tasks assessed/performed Overall Cognitive Status: Within Functional Limits for tasks assessed                                 General Comments: A, Ox4, able to understand instructions and repond to questions with no difficulty      General Comments      Exercises Other Exercises Other Exercises: Supine ther-ex, 10x, ankle pumps, SLR, hip abd/add, CGA and cues for proper technique. Pt has greater difficulty performing exercises with RLE than LLE   Assessment/Plan    PT Assessment Patient needs continued PT services  PT Problem List Decreased strength;Decreased range of motion;Decreased activity tolerance;Decreased mobility;Decreased knowledge of use of DME;Pain       PT Treatment Interventions DME instruction;Gait training;Functional mobility training;Therapeutic activities;Therapeutic exercise;Stair training;Balance training;Patient/family education    PT Goals (Current goals can be found in the Care Plan section)  Acute Rehab PT Goals Patient Stated Goal: to go home PT Goal Formulation: With patient Time For Goal Achievement: 08/19/17 Potential to Achieve Goals: Good    Frequency Min 2X/week   Barriers to discharge        Co-evaluation               AM-PAC PT "6 Clicks" Daily Activity  Outcome Measure Difficulty turning over in bed (including adjusting bedclothes, sheets and blankets)?: None Difficulty moving from lying on back to sitting on the side of the bed? : None Difficulty sitting down on and standing up from a chair with arms (e.g., wheelchair, bedside commode, etc,.)?: None Help needed moving to and from a bed to chair (including a wheelchair)?: A Little Help needed walking in hospital room?: A  Little Help needed climbing 3-5 steps with a railing? : A Lot 6 Click Score: 20    End of Session Equipment Utilized During Treatment: Gait belt Activity Tolerance: Patient tolerated treatment well;Patient limited by pain Patient left: in chair;with call bell/phone within reach;with chair alarm set;with nursing/sitter in room Nurse Communication: Mobility status;Patient requests pain meds (Communicated with RN throughout session about pain meds) PT Visit Diagnosis: Other abnormalities of gait and mobility (R26.89);Muscle weakness (generalized) (M62.81);Pain Pain - part of body:  (Neck and legs)    Time: 0960-4540 PT Time Calculation (min) (ACUTE ONLY): 29 min   Charges:         PT G Codes:   PT G-Codes **NOT FOR INPATIENT CLASS** Functional Assessment Tool Used: AM-PAC 6 Clicks Basic Mobility Functional Limitation: Mobility: Walking and moving around Mobility: Walking and Moving Around Current Status (J8119): At least 20 percent but less than 40 percent impaired, limited or restricted Mobility: Walking and Moving Around Goal Status [phone]): At least 1 percent but less than 20 percent impaired, limited or restricted    Manfred Arch, SPT  Manfred Arch 08/05/2017, 11:48 AM

## 2017-08-05 NOTE — Progress Notes (Signed)
[facility] [place] NAME: [name]    MR#:  [id number]  DATE OF BIRTH:  02-09-1925  SUBJECTIVE: Admitted for confusion, found to have UTI and hyponatremia.  She is alert and awake and very hard of hearing denies any complaints and wants to go home.  She is working with physical therapy now.  CHIEF COMPLAINT:   Chief Complaint  Patient presents with  . Near Syncope  . Altered Mental Status  . Weakness    REVIEW OF SYSTEMS:   ROS CONSTITUTIONAL: No fever, fatigue or weakness.  EYES: No blurred or double vision.  EARS, NOSE, AND THROAT: No tinnitus or ear pain.  RESPIRATORY: No cough, shortness of breath, wheezing or hemoptysis.  CARDIOVASCULAR: No chest pain, orthopnea, edema.  GASTROINTESTINAL: No nausea, vomiting, diarrhea or abdominal pain.  GENITOURINARY: No dysuria, hematuria.  ENDOCRINE: No polyuria, nocturia,  HEMATOLOGY: No anemia, easy bruising or bleeding SKIN: No rash or lesion. MUSCULOSKELETAL: No joint pain or arthritis.   NEUROLOGIC: No tingling, numbness, weakness.  PSYCHIATRY: No anxiety or depression.   DRUG ALLERGIES:   Allergies  Allergen Reactions  . Ceftin [Cefuroxime]   . Cortisone Other (See Comments)    Headache   . Fluticasone Swelling    Facial  . Levofloxacin Nausea And Vomiting  . Meperidine Other (See Comments)  . Morphine And Related Other (See Comments)    headache  . Morphine Sulfate   . Nisoldipine   . Penicillin G Potassium In D5w   . Prednisone Other (See Comments)    Headache   . Sulfa Antibiotics Nausea Only    VITALS:  Blood pressure 126/80, pulse 97, temperature 98.2 F (36.8 C), temperature source Oral, resp. rate 20, height 5\' 1"  (1.549 m), weight 56.7 kg (125 lb), SpO2 95 %.  PHYSICAL EXAMINATION:  GENERAL:  83 y.o.-year-old patient lying in the bed with no acute distress.  EYES: Pupils equal, round, reactive to light and accommodation. No scleral icterus.  Extraocular muscles intact.  HEENT: Head atraumatic, normocephalic. Oropharynx and nasopharynx clear.  NECK:  Supple, no jugular venous distention. No thyroid enlargement, no tenderness.  LUNGS: Normal breath sounds bilaterally, no wheezing, rales,rhonchi or crepitation. No use of accessory muscles of respiration.  CARDIOVASCULAR: S1, S2 normal. No murmurs, rubs, or gallops.  ABDOMEN: Soft, nontender, nondistended. Bowel sounds present. No organomegaly or mass.  EXTREMITIES: No pedal edema, cyanosis, or clubbing.  NEUROLOGIC: Cranial nerves II through XII are intact. Muscle strength 5/5 in all extremities. Sensation intact. Gait not checked.  PSYCHIATRIC: The patient is alert and oriented x 3.  SKIN: No obvious rash, lesion, or ulcer.    LABORATORY PANEL:   CBC  Recent Labs Lab 08/04/17 1130  WBC 6.7  HGB 13.6  HCT 41.1  PLT 194   ------------------------------------------------------------------------------------------------------------------  Chemistries   Recent Labs Lab 08/04/17 1130 08/05/17 0338  NA 131* 131*  K 4.4 3.1*  CL 98* 103  CO2 22 23  GLUCOSE 104* 130*  BUN 13 8  CREATININE 0.76 0.49  CALCIUM 10.0 8.9  AST 30  --   ALT 18  --   ALKPHOS 63  --   BILITOT 1.2  --    ------------------------------------------------------------------------------------------------------------------  Cardiac Enzymes  Recent Labs Lab 08/04/17 1130  TROPONINI <0.03   ------------------------------------------------------------------------------------------------------------------  RADIOLOGY:  Ct Abdomen Pelvis W Contrast  Result Date: 08/04/2017 CLINICAL DATA:  Lower abdominal pain. Urinary retention. Indeterminate left renal lesion. EXAM: CT ABDOMEN AND PELVIS WITH  CONTRAST TECHNIQUE: Multidetector CT imaging of the abdomen and pelvis was performed using the standard protocol following bolus administration of intravenous contrast. CONTRAST:  156mL ISOVUE-300  IOPAMIDOL (ISOVUE-300) INJECTION 61% COMPARISON:  06/15/2017 FINDINGS: Lower Chest: No acute findings. Hepatobiliary: No hepatic masses identified. Gallbladder is unremarkable. Pancreas:  No mass or inflammatory changes. Spleen: Within normal limits in size and appearance. Adrenals/Urinary Tract: Tiny subcapsular cyst in upper pole of left kidney is stable. 11 mm lesion is seen in the posterior midpole left kidney which is suspicious for an enhancing soft tissue mass. This remains stable compared to recent exam approximately 7 weeks ago. Small renal cell carcinoma cannot be excluded. No evidence of hydronephrosis. Urinary bladder remains distended, but Foley catheter is seen in appropriate position. Stomach/Bowel: No evidence of obstruction, inflammatory process or abnormal fluid collections. Vascular/Lymphatic: No pathologically enlarged lymph nodes. No abdominal aortic aneurysm. Reproductive: Prior hysterectomy noted. Adnexal regions are unremarkable in appearance. Other:  None. Musculoskeletal: No suspicious bone lesions identified. PLIF hardware again seen from L3-S1. Old T8 vertebral body compression fracture appears stable compared to previous CT in 2015. IMPRESSION: No acute findings. Persistent distention of the urinary bladder, with Foley catheter in place. Stable 11 mm lesion in the left kidney, suspicious for solid renal neoplasm such as renal cell carcinoma. Renal protocol abdomen MRI without and with contrast recommended for further characterization. Electronically Signed   By: Earle Gell M.D.   On: 08/04/2017 15:57    EKG:   Orders placed or performed during the hospital encounter of 08/04/17  . EKG 12-Lead  . EKG 12-Lead    ASSESSMENT AND PLAN:   #1 metabolic encephalopathy secondary to UTI: Continue Rocephin, wait for urine cultures before I discharge her.  Patient had a Foley before that was taken out day before this admission, patient did have a previous urine retention. 2..  Hyponatremia due to dehydration: Received IV fluids, decrease IV fluids, encourage p.o. intake. 3.  Deconditioning: Physical therapy recommendation for discharge planning. 4 essential hypertension;controlled. 5.  Depression 6.  Urine retention: Patient had about thousand mL of urine in the emergency room, status post Foley catheter, urologist recommended Foley catheter, Flomax, discharging with Flomax, Foley catheter and follow-up with urology as an outpatient.    All the records are reviewed and case discussed with Care Management/Social Workerr. Management plans discussed with the patient, family and they are in agreement.  CODE STATUS: full  TOTAL TIME TAKING CARE OF THIS PATIENT:33 minutes.   POSSIBLE D/C IN 1-2 DAYS, DEPENDING ON CLINICAL CONDITION.   Epifanio Lesches M.D on 08/05/2017 at 10:01 AM  Between 7am to 6pm - Pager - [phone]  After 6pm go to [email or web address] - password [email or web address] [facility]  Office  [phone]  CC: Primary care physician; Lavera Guise, MD   Note: This dictation was prepared with Dragon dictation along with smaller phrase technology. Any transcriptional errors that result from this process are unintentional.

## 2017-08-05 NOTE — Progress Notes (Addendum)
Per patient's daughter Lebron Quam is not ordered as pt takes at home. Per Dr. Vianne Bulls discontinue scheduled and PRN norco and place order for 1-2 percocet 10/325 q 6 hours PRN. Okay per MD to give patient a tablet now as she is in pain at this time.

## 2017-08-05 NOTE — Progress Notes (Signed)
Initial Nutrition Assessment  DOCUMENTATION CODES:   Not applicable  INTERVENTION:   Ensure Enlive po TID, each supplement provides 350 kcal and 20 grams of protein  Magic cup TID with meals, each supplement provides 290 kcal and 9 grams of protein  NUTRITION DIAGNOSIS:   Inadequate oral intake related to acute illness as evidenced by meal completion < 25%  GOAL:   Patient will meet greater than or equal to 90% of their needs  MONITOR:   PO intake, Supplement acceptance, Labs, Weight trends  REASON FOR ASSESSMENT:   Malnutrition Screening Tool    ASSESSMENT:   83 y.o. female with a history of chronic back pain on opiates, hyperlipidemia, urinary retention, hypertension, hyponatremia, macular degeneration who presents for evaluation of confusion and weakness.    Met with pt in room today. Pt confused and is a poor historian. Pt unable to tell me if she has eaten but does report that she drinks chocolate Ensure. Per chart, pt documented to have eaten just bites this morning. Pt's admit weight appears to be a stated weight so unsure if pt has lost weight since last admit. Pt does appear to have lost ~8lbs from August to October; this is not significant given time frame. Pt with moderate muscle wasting likely secondary to advanced age and sarcopenia. RD will order Ensure and Magic Cups. Pt with hypokalemia; monitor and supplement as needed per MD discretion.    Medications reviewed and include: lovenox, hydrocodone, remeron, ativan, pyridium, KCl, NaCl _0 /hr, ceftriaxone, zofran  Labs reviewed: Na 131(L), K 3.1(L)  Nutrition-Focused physical exam completed. Findings are no fat depletion, moderate muscle depletions in temporal regions, clavicles, and shoulders, severe muscle wasting in hands, and no edema.   Diet Order:  Diet regular Room service appropriate? Yes; Fluid consistency: Thin  EDUCATION NEEDS:   Not appropriate for education at this time  Skin:  Reviewed RN  Assessment  Last BM:  10/24  Height:   Ht Readings from Last 1 Encounters:  08/04/17 _1  (1.549 m)    Weight:   Wt Readings from Last 1 Encounters:  08/04/17 125 lb (56.7 kg)    Ideal Body Weight:  47.7 kg  BMI:  Body mass index is 23.62 kg/m.  Estimated Nutritional Needs:   Kcal:  1200-1500kcal/day   Protein:  68-79g/day   Fluid:  >1.4L/day  Koleen Distance MS, RD, LDN Pager #[phone] After Hours Pager: [phone]

## 2017-08-05 NOTE — Progress Notes (Signed)
Called Dr. Estanislado Pandy regarding change in patient's home medication.  Appropriate orders were placed.  Christene Slates  08/05/2017  3:35 AM

## 2017-08-05 NOTE — Progress Notes (Signed)
Pharmacy Antibiotic Note  [name] is a 83 y.o. female admitted on 08/04/2017 with UTI.  Pharmacy has been consulted for Ceftriaxone dosing.  Plan: Continue ceftriaxone 1g q 24hr. Follow up ucx Pt ordered pyridum x 3 days- please consider reducing to 2 days per manufacture reccomendations    Height: 5\' 1"  (154.9 cm) Weight: 125 lb (56.7 kg) IBW/kg (Calculated) : 47.8  Temp (24hrs), Avg:98 F (36.7 C), Min:97.7 F (36.5 C), Max:98.2 F (36.8 C)   Recent Labs Lab 07/31/17 0936 08/04/17 1130 08/05/17 0338  WBC 7.1 6.7  --   CREATININE 0.60 0.76 0.49    Estimated Creatinine Clearance: 36 mL/min (by C-G formula based on SCr of 0.49 mg/dL).    Allergies  Allergen Reactions  . Ceftin [Cefuroxime]   . Cortisone Other (See Comments)    Headache   . Fluticasone Swelling    Facial  . Levofloxacin Nausea And Vomiting  . Meperidine Other (See Comments)  . Morphine And Related Other (See Comments)    headache  . Morphine Sulfate   . Nisoldipine   . Penicillin G Potassium In D5w   . Prednisone Other (See Comments)    Headache   . Sulfa Antibiotics Nausea Only    Antimicrobials this admission: CTX 10/25 >>       >>    Dose adjustments this admission:    Microbiology results:   BCx:   10/25 UCx: pending    Sputum:      MRSA PCR:    Thank you for allowing pharmacy to be a part of this patient's care.  Ramond Dial, Pharm.D, BCPS Clinical Pharmacist  08/05/2017 8:52 AM

## 2017-08-05 NOTE — Progress Notes (Signed)
Per Pharmacy place order for aspercream as it is the equivalent of Ephraim Hamburger which was on the PRN RN may order list.

## 2017-08-06 LAB — URINE CULTURE: Culture: >=100000 — AB

## 2017-08-06 MED ORDER — HYDROCODONE-ACETAMINOPHEN 10-325 MG PO TABS
1.0000 | ORAL_TABLET | Freq: Once | ORAL | Status: AC
  Administered 2017-08-06: 1 via ORAL
  Filled 2017-08-06: qty 1

## 2017-08-06 MED ORDER — HYDROCODONE-ACETAMINOPHEN 10-325 MG PO TABS
1.0000 | ORAL_TABLET | ORAL | Status: DC | PRN
  Administered 2017-08-06 – 2017-08-07 (×4): 1 via ORAL
  Administered 2017-08-07: 2 via ORAL
  Filled 2017-08-06 (×3): qty 1
  Filled 2017-08-06: qty 2
  Filled 2017-08-06: qty 1

## 2017-08-06 NOTE — Progress Notes (Signed)
Notified MD that patient is still experiencing pain more frequently than every 6 hours. Per MD give 1 time dose of norco 1 tab 10/325 and then change PRN order to every 4 hours.

## 2017-08-06 NOTE — Progress Notes (Signed)
Per Dr. Darvin Neighbours bladder scan patient and flush foley to see if more urine will return.

## 2017-08-06 NOTE — Progress Notes (Signed)
[facility] [place] NAME: [name]    MR#:  [id number]  DATE OF BIRTH:  01/25/1925    CHIEF COMPLAINT:   Chief Complaint  Patient presents with  . Near Syncope  . Altered Mental Status  . Weakness   Patient is hard of hearing.  Her main concern is suprapubic pain today.  Dark urine in Foley.  REVIEW OF SYSTEMS:   ROS CONSTITUTIONAL: Fatigue EYES: No blurred or double vision.  EARS, NOSE, AND THROAT: No tinnitus or ear pain.  RESPIRATORY: No cough, shortness of breath, wheezing or hemoptysis.  CARDIOVASCULAR: No chest pain, orthopnea, edema.  GASTROINTESTINAL: Suprapubic abdominal pain GENITOURINARY: No dysuria, hematuria.  ENDOCRINE: No polyuria, nocturia,  HEMATOLOGY: No anemia, easy bruising or bleeding SKIN: No rash or lesion. MUSCULOSKELETAL: No joint pain or arthritis.   NEUROLOGIC: No tingling, numbness, weakness.  PSYCHIATRY: No anxiety or depression.   DRUG ALLERGIES:   Allergies  Allergen Reactions  . Ceftin [Cefuroxime]   . Cortisone Other (See Comments)    Headache   . Fluticasone Swelling    Facial  . Levofloxacin Nausea And Vomiting  . Meperidine Other (See Comments)  . Morphine And Related Other (See Comments)    headache  . Morphine Sulfate   . Nisoldipine   . Penicillin G Potassium In D5w   . Prednisone Other (See Comments)    Headache   . Sulfa Antibiotics Nausea Only    VITALS:  Blood pressure (!) 120/97, pulse (!) 106, temperature 98 F (36.7 C), temperature source Oral, resp. rate 18, height 5\' 1"  (1.549 m), weight 56.7 kg (125 lb), SpO2 93 %.  PHYSICAL EXAMINATION:  GENERAL:  83 y.o.-year-old patient lying in the bed with no acute distress.  EYES: Pupils equal, round, reactive to light and accommodation. No scleral icterus. Extraocular muscles intact.   HEENT: Head atraumatic, normocephalic. Oropharynx and nasopharynx clear. Decreased hearing NECK:  Supple, no jugular venous  distention. No thyroid enlargement, no tenderness.  LUNGS: Normal breath sounds bilaterally, no wheezing, rales,rhonchi or crepitation. No use of accessory muscles of respiration.  CARDIOVASCULAR: S1, S2 normal. No murmurs, rubs, or gallops.  ABDOMEN: Soft, nontender, nondistended. Bowel sounds present. No organomegaly or mass.  EXTREMITIES: No pedal edema, cyanosis, or clubbing.  NEUROLOGIC: Cranial nerves II through XII are intact. Muscle strength 5/5 in all extremities. Sensation intact. Gait not checked.  PSYCHIATRIC: The patient is alert and awake SKIN: No obvious rash, lesion, or ulcer.    LABORATORY PANEL:   CBC  Recent Labs Lab 08/04/17 1130  WBC 6.7  HGB 13.6  HCT 41.1  PLT 194   ------------------------------------------------------------------------------------------------------------------  Chemistries   Recent Labs Lab 08/04/17 1130 08/05/17 0338  NA 131* 131*  K 4.4 3.1*  CL 98* 103  CO2 22 23  GLUCOSE 104* 130*  BUN 13 8  CREATININE 0.76 0.49  CALCIUM 10.0 8.9  AST 30  --   ALT 18  --   ALKPHOS 63  --   BILITOT 1.2  --    ------------------------------------------------------------------------------------------------------------------  Cardiac Enzymes  Recent Labs Lab 08/04/17 1130  TROPONINI <0.03   ------------------------------------------------------------------------------------------------------------------  RADIOLOGY:  Ct Abdomen Pelvis W Contrast  Result Date: 08/04/2017 CLINICAL DATA:  Lower abdominal pain. Urinary retention. Indeterminate left renal lesion. EXAM: CT ABDOMEN AND PELVIS WITH CONTRAST TECHNIQUE: Multidetector CT imaging of the abdomen and pelvis was performed using the standard protocol following bolus administration of intravenous contrast. CONTRAST:  125mL ISOVUE-300 IOPAMIDOL (  ISOVUE-300) INJECTION 61% COMPARISON:  06/15/2017 FINDINGS: Lower Chest: No acute findings. Hepatobiliary: No hepatic masses identified.  Gallbladder is unremarkable. Pancreas:  No mass or inflammatory changes. Spleen: Within normal limits in size and appearance. Adrenals/Urinary Tract: Tiny subcapsular cyst in upper pole of left kidney is stable. 11 mm lesion is seen in the posterior midpole left kidney which is suspicious for an enhancing soft tissue mass. This remains stable compared to recent exam approximately 7 weeks ago. Small renal cell carcinoma cannot be excluded. No evidence of hydronephrosis. Urinary bladder remains distended, but Foley catheter is seen in appropriate position. Stomach/Bowel: No evidence of obstruction, inflammatory process or abnormal fluid collections. Vascular/Lymphatic: No pathologically enlarged lymph nodes. No abdominal aortic aneurysm. Reproductive: Prior hysterectomy noted. Adnexal regions are unremarkable in appearance. Other:  None. Musculoskeletal: No suspicious bone lesions identified. PLIF hardware again seen from L3-S1. Old T8 vertebral body compression fracture appears stable compared to previous CT in 2015. IMPRESSION: No acute findings. Persistent distention of the urinary bladder, with Foley catheter in place. Stable 11 mm lesion in the left kidney, suspicious for solid renal neoplasm such as renal cell carcinoma. Renal protocol abdomen MRI without and with contrast recommended for further characterization. Electronically Signed   By: Earle Gell M.D.   On: 08/04/2017 15:57    EKG:   Orders placed or performed during the hospital encounter of 08/04/17  . EKG 12-Lead  . EKG 12-Lead    ASSESSMENT AND PLAN:   *Acute toxic encephalopathy due to E. coli UTI.  Continue IV ceftriaxone.  *Chronic mild hyponatremia is stable  *  Deconditioning Will need home health physical therapy at discharge.  * essential hypertension;controlled.  *  Depression Continue home medications  *Chronic urinary retention.  Foley catheter replaced.  Will go home with Foley catheter and follow-up with  urology.  All the records are reviewed and case discussed with Care Management/Social Workerr. Management plans discussed with the patient, family and they are in agreement.  CODE STATUS: full  TOTAL TIME TAKING CARE OF THIS PATIENT:33 minutes.   POSSIBLE D/C IN 1-2 DAYS, DEPENDING ON CLINICAL CONDITION.   Hillary Bow R M.D on 08/06/2017 at 1:16 PM  Between 7am to 6pm - Pager - [phone]  After 6pm go to [email or web address] - password [email or web address] [facility]  Office  [phone]  CC: Primary care physician; Lavera Guise, MD   Note: This dictation was prepared with Dragon dictation along with smaller phrase technology. Any transcriptional errors that result from this process are unintentional.

## 2017-08-06 NOTE — Progress Notes (Signed)
Notified Dr. Darvin Neighbours that patient was complaining about face being numb no other neurological deficits. No new orders received will continue to monitor.

## 2017-08-06 NOTE — Progress Notes (Signed)
Per Dr. Darvin Neighbours okay to hold carvedilol for BP of 102/70. Patient had BP lower than this in the afternoon.

## 2017-08-07 LAB — BASIC METABOLIC PANEL
Anion gap: 7 (ref 5–15)
BUN: 12 mg/dL (ref 6–20)
CHLORIDE: 102 mmol/L (ref 101–111)
CO2: 23 mmol/L (ref 22–32)
CREATININE: 0.57 mg/dL (ref 0.44–1.00)
Calcium: 9.5 mg/dL (ref 8.9–10.3)
GFR calc non Af Amer: >60 mL/min (ref >60)
GLUCOSE: 97 mg/dL (ref 65–99)
Potassium: 5 mmol/L (ref 3.5–5.1)
Sodium: 132 mmol/L — ABNORMAL LOW (ref 135–145)

## 2017-08-07 MED ORDER — CEPHALEXIN 500 MG PO CAPS: 500.0000 mg | ORAL_CAPSULE | Freq: Three times a day (TID) | ORAL | 0 refills | Status: AC

## 2017-08-07 MED ORDER — AMLODIPINE BESYLATE 5 MG PO TABS: 2.5000 mg | ORAL_TABLET | Freq: Every day | ORAL | Status: DC

## 2017-08-07 MED ORDER — POTASSIUM CHLORIDE CRYS ER 10 MEQ PO TBCR
10.0000 meq | EXTENDED_RELEASE_TABLET | Freq: Every day | ORAL | Status: DC
  Administered 2017-08-07: 10 meq via ORAL
  Filled 2017-08-07: qty 1

## 2017-08-07 NOTE — Progress Notes (Signed)
[name]  A and O x 4. VSS. Pt tolerating diet well. No complaints of pain or nausea. IV removed intact, prescriptions given. Pt voiced understanding of discharge instructions with no further questions.Pt and family received education related to foley care and emptying. Pt discharged via wheelchair with axillary.  Lynann Bologna MSN, RN-BC  Allergies as of 08/07/2017      Reactions   Ceftin [cefuroxime]    Cortisone Other (See Comments)   Headache   Fluticasone Swelling   Facial   Levofloxacin Nausea And Vomiting   Meperidine Other (See Comments)   Morphine And Related Other (See Comments)   headache   Morphine Sulfate    Nisoldipine    Penicillin G Potassium In D5w    Prednisone Other (See Comments)   Headache   Sulfa Antibiotics Nausea Only      Medication List    TAKE these medications   amLODipine 5 MG tablet Commonly known as:  NORVASC Take 0.5 tablets (2.5 mg total) by mouth daily. What changed:  how much to take   CALCIUM 600 600 MG Tabs tablet Generic drug:  calcium carbonate Take by mouth.   carvedilol 6.25 MG tablet Commonly known as:  COREG Take 6.25 mg by mouth 2 (two) times daily with a meal.   cephALEXin 500 MG capsule Commonly known as:  KEFLEX Take 1 capsule (500 mg total) by mouth 3 (three) times daily.   HYDROcodone-acetaminophen 10-325 MG tablet Commonly known as:  NORCO Take 1 tablet by mouth 3 (three) times daily.   LORazepam 0.5 MG tablet Commonly known as:  ATIVAN Take 0.5 mg by mouth 2 (two) times daily.   meloxicam 7.5 MG tablet Commonly known as:  MOBIC Take 7.5 mg by mouth daily as needed for pain.   mirtazapine 30 MG tablet Commonly known as:  REMERON Take 30 mg by mouth at bedtime.   tamsulosin 0.4 MG Caps capsule Commonly known as:  FLOMAX Take 1 capsule (0.4 mg total) by mouth daily.       Vitals:   08/07/17 1249 08/07/17 1348  BP: 132/90 119/76  Pulse: (!) 111 (!) 114  Resp: 20   Temp: 98.6 F (37 C) 98.1  F (36.7 C)  SpO2: 96% 96%

## 2017-08-07 NOTE — Discharge Instructions (Signed)
Resume diet and activity as before ° ° °

## 2017-08-07 NOTE — Care Management Note (Signed)
Case Management Note  Patient Details  Name: [name] MRN: [id number] Date of Birth: [birth year]  Subjective/Objective:   Discussed discharge planning with [name]. She refused offer of home health services twice to this Probation officer.                  Action/Plan:   Expected Discharge Date:  08/07/17               Expected Discharge Plan:     In-House Referral:     Discharge planning Services     Post Acute Care Choice:    Choice offered to:     DME Arranged:    DME Agency:     HH Arranged:    HH Agency:     Status of Service:     If discussed at [employer], dates discussed:    Additional Comments:  Mikaeel Petrow A, RN 08/07/2017, 11:17 AM

## 2017-08-10 NOTE — Discharge Summary (Signed)
[facility] [place] NAME: [name]    MR#:  [id number]  DATE OF BIRTH:  [birth year]  DATE OF ADMISSION:  08/04/2017 ADMITTING PHYSICIAN: Bettey Costa, MD  DATE OF DISCHARGE: 08/07/2017  3:50 PM  PRIMARY CARE PHYSICIAN: Lavera Guise, MD   ADMISSION DIAGNOSIS:  Urinary retention [R33.9] Confusion [R41.0] Weakness [R53.1] Encephalopathy acute [G93.40] Acute cystitis without hematuria [N30.00]  DISCHARGE DIAGNOSIS:  Active Problems:   Acute encephalopathy   SECONDARY DIAGNOSIS:   Past Medical History:  Diagnosis Date  . Anxiety   . Chronic back pain   . High cholesterol   . History of urinary retention   . Hypertension   . Pneumonia    ADMITTING HISTORY  HISTORY OF PRESENT ILLNESS:  [name]  is a 83 y.o. female with a known history of macular degeneration, spinal stenosis with ongoing right leg pain and sensitivity and urinary retention who presents due to altered mental status. Family is at bedside who reports that over the past week she has had increasing confusion and hallucinations. She was recently diagnosed with urinary retention and had a Foley catheter placed. This was removed yesterday. She presented today due to weakness and confusion. At her baseline patient has no confusion. Patient is now complaining of abdominal pain and dysuria. She has found to have a urinary tract infection and has been started on Rocephin. She also had greater than 900 cc in her bladder and a Foley catheter has been placed.  HOSPITAL COURSE:   *Acute toxic encephalopathy due to E. coli UTI.  on IV ceftriaxone. Change to Keflex at discharge.  *Chronic mild hyponatremia is stable  *  Deconditioning Seen by physical therapy.  Home health physical therapy set up after discharge  * essential hypertension;controlled. Blood pressure continues to run in the low normal range.  Her Norvasc has been decreased from 5 mg daily to 2.5  mg daily.  *  Depression Continued home medications  *Chronic urinary retention.  Foley catheter replaced.  Will go home with Foley catheter and follow-up with urology.  She wants to follow-up with Dr. Junious Silk who she has seen recently.  Stable for discharge home.  CONSULTS OBTAINED:    DRUG ALLERGIES:   Allergies  Allergen Reactions  . Ceftin [Cefuroxime]   . Cortisone Other (See Comments)    Headache   . Fluticasone Swelling    Facial  . Levofloxacin Nausea And Vomiting  . Meperidine Other (See Comments)  . Morphine And Related Other (See Comments)    headache  . Morphine Sulfate   . Nisoldipine   . Penicillin G Potassium In D5w   . Prednisone Other (See Comments)    Headache   . Sulfa Antibiotics Nausea Only    DISCHARGE MEDICATIONS:   Discharge Medication List as of 08/07/2017 12:42 PM    START taking these medications   Details  cephALEXin (KEFLEX) 500 MG capsule Take 1 capsule (500 mg total) by mouth 3 (three) times daily., Starting Sun 08/07/2017, Until Fri 08/12/2017, Normal      CONTINUE these medications which have CHANGED   Details  amLODipine (NORVASC) 5 MG tablet Take 0.5 tablets (2.5 mg total) by mouth daily., Starting Sun 08/07/2017, No Print      CONTINUE these medications which have NOT CHANGED   Details  calcium carbonate (CALCIUM 600) 600 MG TABS tablet Take by mouth., Historical Med    carvedilol (COREG) 6.25 MG tablet Take 6.25 mg by mouth  2 (two) times daily with a meal., Historical Med    HYDROcodone-acetaminophen (NORCO) 10-325 MG tablet Take 1 tablet by mouth 3 (three) times daily. , Historical Med    LORazepam (ATIVAN) 0.5 MG tablet Take 0.5 mg by mouth 2 (two) times daily. , Historical Med    meloxicam (MOBIC) 7.5 MG tablet Take 7.5 mg by mouth daily as needed for pain. , Historical Med    mirtazapine (REMERON) 30 MG tablet Take 30 mg by mouth at bedtime., Historical Med    tamsulosin (FLOMAX) 0.4 MG CAPS capsule Take 1  capsule (0.4 mg total) by mouth daily., Starting Wed 06/22/2017, Normal        Today   VITAL SIGNS:  Blood pressure 119/76, pulse (!) 114, temperature 98.1 F (36.7 C), temperature source Oral, resp. rate 20, height 5\' 1"  (1.549 m), weight 56.7 kg (125 lb), SpO2 96 %.  I/O:  No intake or output data in the 24 hours ending 08/10/17 1200  PHYSICAL EXAMINATION:  Physical Exam  GENERAL:  83 y.o.-year-old patient lying in the bed with no acute distress.  LUNGS: Normal breath sounds bilaterally, no wheezing, rales,rhonchi or crepitation. No use of accessory muscles of respiration.  CARDIOVASCULAR: S1, S2 normal. No murmurs, rubs, or gallops.  ABDOMEN: Soft, non-tender, non-distended. Bowel sounds present. No organomegaly or mass.  NEUROLOGIC: Moves all 4 extremities. PSYCHIATRIC: The patient is alert and oriented x 3.  SKIN: No obvious rash, lesion, or ulcer.  Decreased hearing Foley catheter in place  DATA REVIEW:   CBC  Recent Labs Lab 08/04/17 1130  WBC 6.7  HGB 13.6  HCT 41.1  PLT 194    Chemistries   Recent Labs Lab 08/04/17 1130  08/07/17 0327  NA 131*  < > 132*  K 4.4  < > 5.0  CL 98*  < > 102  CO2 22  < > 23  GLUCOSE 104*  < > 97  BUN 13  < > 12  CREATININE 0.76  < > 0.57  CALCIUM 10.0  < > 9.5  AST 30  --   --   ALT 18  --   --   ALKPHOS 63  --   --   BILITOT 1.2  --   --   < > = values in this interval not displayed.  Cardiac Enzymes  Recent Labs Lab 08/04/17 1130  TROPONINI <0.03    Microbiology Results  Results for orders placed or performed during the hospital encounter of 08/04/17  Urine Culture     Status: Abnormal   Collection Time: 08/04/17 11:30 AM  Result Value Ref Range Status   Specimen Description URINE, RANDOM  Final   Special Requests NONE  Final   Culture >=100,000 COLONIES/mL ESCHERICHIA COLI (A)  Final   Report Status 08/06/2017 FINAL  Final   Organism ID, Bacteria ESCHERICHIA COLI (A)  Final      Susceptibility    Escherichia coli - MIC*    AMPICILLIN >=32 RESISTANT Resistant     CEFAZOLIN <=4 SENSITIVE Sensitive     CEFTRIAXONE <=1 SENSITIVE Sensitive     CIPROFLOXACIN >=4 RESISTANT Resistant     GENTAMICIN <=1 SENSITIVE Sensitive     IMIPENEM <=0.25 SENSITIVE Sensitive     NITROFURANTOIN <=16 SENSITIVE Sensitive     TRIMETH/SULFA >=320 RESISTANT Resistant     AMPICILLIN/SULBACTAM 16 INTERMEDIATE Intermediate     PIP/TAZO <=4 SENSITIVE Sensitive     Extended ESBL NEGATIVE Sensitive     * >=100,000 COLONIES/mL  ESCHERICHIA COLI    RADIOLOGY:  No results found.  Follow up with PCP in 1 week.  Management plans discussed with the patient, family and they are in agreement.  CODE STATUS:  Code Status History    Date Active Date Inactive Code Status Order ID Comments User Context   08/04/2017  4:50 PM 08/07/2017  6:50 PM Full Code [id number]  Bettey Costa, MD Inpatient   08/04/2017  3:47 PM 08/04/2017  4:50 PM Full Code [id number]  Bettey Costa, MD ED   12/15/2015  8:14 AM 12/19/2015  4:32 PM Full Code [id number]  Saundra Shelling, MD Inpatient    Advance Directive Documentation     Most Recent Value  Type of Advance Directive  Healthcare Power of Attorney  Pre-existing out of facility DNR order (yellow form or pink MOST form)  -  "MOST" Form in Place?  -      TOTAL TIME TAKING CARE OF THIS PATIENT ON DAY OF DISCHARGE: more than 30 minutes.   Hillary Bow R M.D on 08/10/2017 at 12:00 PM  Between 7am to 6pm - Pager - [phone]  After 6pm go to [email or web address] - password [email or web address] [facility]  Office  [phone]  CC: Primary care physician; Lavera Guise, MD  Note: This dictation was prepared with Dragon dictation along with smaller phrase technology. Any transcriptional errors that result from this process are unintentional.

## 2017-08-11 ENCOUNTER — Other Ambulatory Visit: Payer: Self-pay

## 2017-08-11 NOTE — Patient Outreach (Signed)
[facility]) Care Management  08/11/2017  AYDA TANCREDI 18-Feb-1925 [id number]   EMMI: General discharge Referral date: 08/11/17 Referral source: EMMI general discharge. Referral reason: Knows who to call about changes in condition; NO Day # 1  Telephone call to patient regarding EMMI general discharge red alert.  Patient did not verify HIPAA. Patient states she felt like her blood pressure was very low and she didn't feel well. Patient states she has attempted to contact her daughter but could not get her on the phone. Patient states she would not be able to talk because she needed to try to contact her daughter. RNCM encouraged patient to attempt contact with daughter again. Advised patient if symptoms worsen to call 911 immediately. Patient verbalized understanding.  PLAN: RNCM will attempt 2nd telephone call to patient within 3 business days.   Quinn Plowman RN,BSN,CCM [facility] Telephonic  [phone]

## 2017-08-12 ENCOUNTER — Ambulatory Visit: Payer: Self-pay

## 2017-08-15 ENCOUNTER — Other Ambulatory Visit: Payer: Self-pay

## 2017-08-15 NOTE — Progress Notes (Deleted)
08/16/2017 1:23 PM   [name] [birth year] [id number]  Referring provider: Lavera Guise, [place], [place] [address]  No chief complaint on file.   HPI: 83 yo WF with urinary retention and a left renal mass who presents today for follow up after being discharged from the hospital for urinary rentention and acute toxic encephalopathy.    #1 urinary retention-the patient was seen 04/15/2017 and she complained of some difficulty urinating. She was treated for a "UTI with Cipro. She then presented September 7 and a Foley catheter was placed because of a 700 mL post void. She reports an in September 10 the emergency department again and said the Foley catheter had fallen out and it was replaced with a post void of about 500. She also complained of rectal pain and constipation. She is on tamsulosin. I found 2 or 3 urine cultures which were all negative. She's had this issue on and off x 20 years. She was constipated and took miralax and prune juice. She's had diarrhea. NG risk includes back fusion and possible neuropathy. She tried CIC and couldn't do it. The last time she needed a catheter for a prolonged period was about 2 years ago. On 07/25/2017,  she was seen at her PCP's office today and was instructed to come to our office as their bladder scan noted a PVR of 400 cc's.  She is quite uncomfortable at this time. She is complaining of painful urination, intermittency and straining to urinate. PVR our offices at 470 mL. Foley was placed.  TOV was successful one week later, but then she went back into retention and was seen in the ED.  She had > 999 mL in her bladder.    #2 left renal mass-patient underwent a CT scan of the abdomen and pelvis with contrast 06/20/2017 because of abdominal pain and pressure. This revealed an endophytic somewhat ill-defined 1.1 cm left mid posterior renal mass that appeared to enhance 215 Hounsfield units and 91 Hounsfield units on the delayed  images.  A follow up MRI is recommended and she has an appointment on 08/18/2017 to discuss MRI results - which is now postponed due to hospitalization.     PMH: Past Medical History:  Diagnosis Date  . Anxiety   . Chronic back pain   . High cholesterol   . History of urinary retention   . Hypertension   . Pneumonia     Surgical History: Past Surgical History:  Procedure Laterality Date  . ABDOMINAL HYSTERECTOMY    . APPENDECTOMY    . BACK SURGERY    . BREAST BIOPSY Right 2002   neg  . TONSILLECTOMY      Home Medications:  Allergies as of 08/16/2017      Reactions   Ceftin [cefuroxime]    Cortisone Other (See Comments)   Headache   Fluticasone Swelling   Facial   Levofloxacin Nausea And Vomiting   Meperidine Other (See Comments)   Morphine And Related Other (See Comments)   headache   Morphine Sulfate    Nisoldipine    Penicillin G Potassium In D5w    Prednisone Other (See Comments)   Headache   Sulfa Antibiotics Nausea Only      Medication List        Accurate as of 08/15/17  1:23 PM. Always use your most recent med list.          amLODipine 5 MG tablet Commonly known as:  NORVASC Take  0.5 tablets (2.5 mg total) by mouth daily.   CALCIUM 600 600 MG Tabs tablet Generic drug:  calcium carbonate Take by mouth.   carvedilol 6.25 MG tablet Commonly known as:  COREG Take 6.25 mg by mouth 2 (two) times daily with a meal.   HYDROcodone-acetaminophen 10-325 MG tablet Commonly known as:  NORCO Take 1 tablet by mouth 3 (three) times daily.   LORazepam 0.5 MG tablet Commonly known as:  ATIVAN Take 0.5 mg by mouth 2 (two) times daily.   meloxicam 7.5 MG tablet Commonly known as:  MOBIC Take 7.5 mg by mouth daily as needed for pain.   mirtazapine 30 MG tablet Commonly known as:  REMERON Take 30 mg by mouth at bedtime.   tamsulosin 0.4 MG Caps capsule Commonly known as:  FLOMAX Take 1 capsule (0.4 mg total) by mouth daily.       Allergies:    Allergies  Allergen Reactions  . Ceftin [Cefuroxime]   . Cortisone Other (See Comments)    Headache   . Fluticasone Swelling    Facial  . Levofloxacin Nausea And Vomiting  . Meperidine Other (See Comments)  . Morphine And Related Other (See Comments)    headache  . Morphine Sulfate   . Nisoldipine   . Penicillin G Potassium In D5w   . Prednisone Other (See Comments)    Headache   . Sulfa Antibiotics Nausea Only    Family History: Family History  Problem Relation Age of Onset  . Breast cancer Sister 36  . Breast cancer Maternal Grandmother 43  . Breast cancer Sister 73  . Bladder Cancer Brother   . Hypertension Neg Hx   . Kidney cancer Neg Hx     Social History:  reports that  has never smoked. she has never used smokeless tobacco. She reports that she does not drink alcohol or use drugs.  ROS:                                        Physical Exam: There were no vitals taken for this visit.  Constitutional:  Alert and oriented, No acute distress. HEENT: [place] AT, moist mucus membranes.  Trachea midline, no masses. Cardiovascular: No clubbing, cyanosis, or edema. Respiratory: Normal respiratory effort, no increased work of breathing. GI: Abdomen is soft, nontender, nondistended, no abdominal masses GU: No CVA tenderness. Urine clear.  Skin: No rashes, bruises or suspicious lesions. Lymph: No cervical or inguinal adenopathy. Neurologic: Grossly intact, no focal deficits, moving all 4 extremities. Psychiatric: Normal mood and affect.  Laboratory Data: Lab Results  Component Value Date   WBC 6.7 08/04/2017   HGB 13.6 08/04/2017   HCT 41.1 08/04/2017   MCV 86.0 08/04/2017   PLT 194 08/04/2017    Lab Results  Component Value Date   CREATININE 0.57 08/07/2017   I have reviewed the labs.   Assessment & Plan:    1) urinary retention-we reviewed several options. We discussed a voiding trial in a few days. She failed CIC.  We discussed  keeping the Foley long-term or switching to a suprapubic tube.  We also discussed referral for urodynamics. All questions answered. They don't wish to pursue UDS or CIC, so we'll keep foley for another week and void trial next week - though I am doubtful it will be successful  2) renal mass- patient is scheduled for an MRI   No  Follow-up on file.  Zara Council, PA-C  [facility] [address] [place], [place] [address] [phone]

## 2017-08-16 ENCOUNTER — Ambulatory Visit: Payer: Medicare Other | Admitting: Urology

## 2017-08-16 DIAGNOSIS — I10 Essential (primary) hypertension: Secondary | ICD-10-CM | POA: Diagnosis not present

## 2017-08-16 DIAGNOSIS — R1084 Generalized abdominal pain: Secondary | ICD-10-CM | POA: Diagnosis not present

## 2017-08-16 DIAGNOSIS — Z880 Allergy status to penicillin: Secondary | ICD-10-CM | POA: Diagnosis not present

## 2017-08-16 DIAGNOSIS — R443 Hallucinations, unspecified: Secondary | ICD-10-CM | POA: Diagnosis not present

## 2017-08-16 DIAGNOSIS — R300 Dysuria: Secondary | ICD-10-CM | POA: Diagnosis not present

## 2017-08-16 DIAGNOSIS — M79604 Pain in right leg: Secondary | ICD-10-CM | POA: Diagnosis not present

## 2017-08-16 DIAGNOSIS — Z885 Allergy status to narcotic agent status: Secondary | ICD-10-CM | POA: Diagnosis not present

## 2017-08-16 DIAGNOSIS — Z7982 Long term (current) use of aspirin: Secondary | ICD-10-CM | POA: Diagnosis not present

## 2017-08-16 DIAGNOSIS — R339 Retention of urine, unspecified: Secondary | ICD-10-CM | POA: Diagnosis not present

## 2017-08-16 DIAGNOSIS — R109 Unspecified abdominal pain: Secondary | ICD-10-CM | POA: Diagnosis not present

## 2017-08-16 DIAGNOSIS — Z79899 Other long term (current) drug therapy: Secondary | ICD-10-CM | POA: Diagnosis not present

## 2017-08-16 DIAGNOSIS — R600 Localized edema: Secondary | ICD-10-CM | POA: Diagnosis not present

## 2017-08-16 DIAGNOSIS — R410 Disorientation, unspecified: Secondary | ICD-10-CM | POA: Diagnosis not present

## 2017-08-16 NOTE — Patient Outreach (Signed)
[facility]) Care Management  08/16/2017  [name] April 06, 1925 [id number]   Late entry for 08/15/17 EMMI: General discharge Referral date: 08/11/17 Referral source: EMMI general discharge. Referral reason: Knows who to call about changes in condition; NO Day # 1  Telephone call to patient regarding EMMI general discharge red alert. HIPAA verified with patient. RNCM discussed EMMI general discharge program with patient. Patient states she has a urinary tract infection and does not feel well. Patient states she has been dealing with this problem since June. Patient states she was in the hospital for this. Patient states she completed her medication on yesterday and now she is having symptoms again today. Patient states she is scheduled to see the urologist in the morning. Patient states she is dealing with a lot of urinary discomfort at this time. Patient denies fever/ chills. Patient states her daughter called the doctor today but they would not call her in a prescription they want her to come in for an appointment. Patient states, "they found out in the hospital it was a bacterial infection." Patient states she does have a catheter. Patient states she has emptied it twice within the last hour. Patient denies having any home health services. Patient states she doesn't want that. RNCM discussed and offered [facility] care management services with patient. Patient refused [facility] services. Patient states her daughter helps her and she will be seen by the doctor tomorrow.  Patient denies having a follow up appointment with her primary MD. [facility] discussed with patient importance of scheduling a post hospital follow up appointment with her primary MD. Patient verbalized understanding.  RNCM advised patient to notify MD of any changes in condition prior to scheduled appointment. RNCM verified patient aware of 911 services for urgent/ emergent needs. Patient requested RNCM mail contact  information for [facility] care management to her.   ASSESSMENT: Per patients hospital discharge summary:  DATE OF ADMISSION:  08/04/2017    DATE OF DISCHARGE: 08/07/2017     ADMISSION DIAGNOSIS:  Urinary retention [R33.9] Confusion [R41.0] Weakness [R53.1] Encephalopathy acute [G93.40] Acute cystitis without hematuria [N30.00]  DISCHARGE DIAGNOSIS:  Active Problems:   Acute encephalopathy  83 y.o.femalewith a known history of macular degeneration, spinal stenosis with ongoing right leg pain and sensitivity and urinary retention   PLAN: RNCM will refer patient to care management assistant to close due to patient refusing services.  RNCM will send patient [facility] care management brochure as requested.   Quinn Plowman RN,BSN,CCM [facility] Telephonic  [phone]

## 2017-08-18 ENCOUNTER — Ambulatory Visit: Payer: Medicare Other

## 2017-08-19 NOTE — Progress Notes (Signed)
08/22/2017 10:31 AM   [name] 17-Feb-1925 [id number]  Referring provider: Lavera Guise, [place],  [address]  Chief Complaint  Patient presents with  . Hospitalization Follow-up    HPI: 83 yo WF with urinary retention and a left renal mass who presents today for follow up after being discharged from the hospital for urinary rentention and acute toxic encephalopathy with her daughters, Rosemarie Ax and [name].   #1 urinary retention-the patient was seen 04/15/2017 and she complained of some difficulty urinating. She was treated for a "UTI with Cipro. She then presented September 7 and a Foley catheter was placed because of a 700 mL post void. She reports an in September 10 the emergency department again and said the Foley catheter had fallen out and it was replaced with a post void of about 500. She also complained of rectal pain and constipation. She is on tamsulosin. I found 2 or 3 urine cultures which were all negative. She's had this issue on and off x 20 years. She was constipated and took miralax and prune juice. She's had diarrhea. NG risk includes back fusion and possible neuropathy. She tried CIC and couldn't do it. The last time she needed a catheter for a prolonged period was about 2 years ago. On 07/25/2017,  she was seen at her PCP's office today and was instructed to come to our office as their bladder scan noted a PVR of 400 cc's.  She is quite uncomfortable at this time. She is complaining of painful urination, intermittency and straining to urinate. PVR our offices at 470 mL. Foley was placed.  TOV was successful one week later, but then she went back into retention and was seen in the ED.  She had > 999 mL in her bladder.   She had the Foley exchanged on 08/16/2017 at [facility] as the 23f was getting clogged with sediment.  A 13f is now in place.    #2 left renal mass-patient underwent a CT scan of the abdomen and pelvis with contrast 06/20/2017 because of  abdominal pain and pressure. This revealed an endophytic somewhat ill-defined 1.1 cm left mid posterior renal mass that appeared to enhance 215 Hounsfield units and 91 Hounsfield units on the delayed images.  A follow up MRI is recommended and she has an appointment on 08/18/2017 to discuss MRI results - which is now postponed due to hospitalization.     PMH: Past Medical History:  Diagnosis Date  . Anxiety   . Chronic back pain   . High cholesterol   . History of urinary retention   . Hypertension   . Pneumonia     Surgical History: Past Surgical History:  Procedure Laterality Date  . ABDOMINAL HYSTERECTOMY    . APPENDECTOMY    . BACK SURGERY    . BREAST BIOPSY Right 2002   neg  . TONSILLECTOMY      Home Medications:  Allergies as of 08/22/2017      Reactions   Ceftin [cefuroxime]    Cortisone Other (See Comments)   Headache   Fluticasone Swelling   Facial   Levofloxacin Nausea And Vomiting   Meperidine Other (See Comments)   Morphine And Related Other (See Comments)   headache   Morphine Sulfate    Nisoldipine    Penicillin G Potassium In D5w    Prednisone Other (See Comments)   Headache   Sulfa Antibiotics Nausea Only      Medication List  Accurate as of 08/22/17 10:31 AM. Always use your most recent med list.          amLODipine 5 MG tablet Commonly known as:  NORVASC Take 0.5 tablets (2.5 mg total) by mouth daily.   CALCIUM 600 600 MG Tabs tablet Generic drug:  calcium carbonate Take by mouth.   carvedilol 6.25 MG tablet Commonly known as:  COREG Take 6.25 mg by mouth 2 (two) times daily with a meal.   HYDROcodone-acetaminophen 10-325 MG tablet Commonly known as:  NORCO Take 1 tablet by mouth 3 (three) times daily.   LORazepam 0.5 MG tablet Commonly known as:  ATIVAN Take 0.5 mg 3 (three) times daily by mouth.   meloxicam 7.5 MG tablet Commonly known as:  MOBIC Take 7.5 mg by mouth daily as needed for pain.   mirtazapine 30 MG  tablet Commonly known as:  REMERON Take 30 mg by mouth at bedtime.   tamsulosin 0.4 MG Caps capsule Commonly known as:  FLOMAX Take 1 capsule (0.4 mg total) by mouth daily.       Allergies:  Allergies  Allergen Reactions  . Ceftin [Cefuroxime]   . Cortisone Other (See Comments)    Headache   . Fluticasone Swelling    Facial  . Levofloxacin Nausea And Vomiting  . Meperidine Other (See Comments)  . Morphine And Related Other (See Comments)    headache  . Morphine Sulfate   . Nisoldipine   . Penicillin G Potassium In D5w   . Prednisone Other (See Comments)    Headache   . Sulfa Antibiotics Nausea Only    Family History: Family History  Problem Relation Age of Onset  . Breast cancer Sister 3  . Breast cancer Maternal Grandmother 29  . Breast cancer Sister 16  . Bladder Cancer Brother   . Hypertension Neg Hx   . Kidney cancer Neg Hx     Social History:  reports that  has never smoked. she has never used smokeless tobacco. She reports that she does not drink alcohol or use drugs.  ROS: UROLOGY Frequent Urination?: No Hard to postpone urination?: No Burning/pain with urination?: No Get up at night to urinate?: No Leakage of urine?: No Urine stream starts and stops?: No Trouble starting stream?: No Do you have to strain to urinate?: No Blood in urine?: No Urinary tract infection?: No Sexually transmitted disease?: No Injury to kidneys or bladder?: No Painful intercourse?: No Weak stream?: Yes Currently pregnant?: No Vaginal bleeding?: No Last menstrual period?: n  Gastrointestinal Nausea?: No Vomiting?: No Indigestion/heartburn?: No Diarrhea?: No Constipation?: Yes  Constitutional Fever: No Night sweats?: No Weight loss?: No Fatigue?: No  Skin Skin rash/lesions?: No Itching?: No  Eyes Blurred vision?: No Double vision?: No  Ears/Nose/Throat Sore throat?: No Sinus problems?: No  Hematologic/Lymphatic Swollen glands?: No Easy  bruising?: No  Cardiovascular Leg swelling?: No Chest pain?: No  Respiratory Cough?: No Shortness of breath?: No  Endocrine Excessive thirst?: No  Musculoskeletal Back pain?: Yes Joint pain?: No  Neurological Headaches?: No Dizziness?: No  Psychologic Depression?: No Anxiety?: No  Physical Exam: BP 128/87   Pulse 83   Ht 5\' 1"  (1.549 m)   Wt 128 lb 8 oz (58.3 kg)   BMI 24.28 kg/m   Constitutional:  Alert and oriented, No acute distress. HEENT: [place] AT, moist mucus membranes.  Trachea midline, no masses. Cardiovascular: No clubbing, cyanosis, or edema. Respiratory: Normal respiratory effort, no increased work of breathing. GI: Abdomen is soft, nontender,  nondistended, no abdominal masses GU: No CVA tenderness. Urine clear.  Skin: No rashes, bruises or suspicious lesions. Lymph: No cervical or inguinal adenopathy. Neurologic: Grossly intact, no focal deficits, moving all 4 extremities. Psychiatric: Normal mood and affect.  Laboratory Data: Lab Results  Component Value Date   WBC 6.7 08/04/2017   HGB 13.6 08/04/2017   HCT 41.1 08/04/2017   MCV 86.0 08/04/2017   PLT 194 08/04/2017    Lab Results  Component Value Date   CREATININE 0.57 08/07/2017   I have reviewed the labs.   Assessment & Plan:    1.  Urinary retention  -Daughters are convinced that if patient takes her tamsulosin on a daily basis she will be able to void on her own, I did express my reservations but agreed to another TOV with the agreement that patient will RTC this afternoon for a bladder scan  - I did discuss the placement of a SPT as a possibility if she should fail this TOV  2.  Renal mass  - patient needs to have her MRI rescheduled   Return for RTC this afternoon for bladder scan.  Zara Council, PA-C  [facility] [address] [place], [place] [address] [phone]

## 2017-08-22 ENCOUNTER — Ambulatory Visit (INDEPENDENT_AMBULATORY_CARE_PROVIDER_SITE_OTHER): Payer: Medicare Other | Admitting: Urology

## 2017-08-22 ENCOUNTER — Encounter: Payer: Self-pay | Admitting: Urology

## 2017-08-22 VITALS — BP 128/87 | HR 83 | Ht 61.0 in | Wt 128.5 lb

## 2017-08-22 DIAGNOSIS — N2889 Other specified disorders of kidney and ureter: Secondary | ICD-10-CM

## 2017-08-22 DIAGNOSIS — R339 Retention of urine, unspecified: Secondary | ICD-10-CM

## 2017-08-22 LAB — BLADDER SCAN AMB NON-IMAGING

## 2017-08-22 NOTE — Progress Notes (Signed)
Catheter Removal  Patient is present today for a catheter removal.  36ml of water was drained from the balloon. A 16FR foley cath was removed from the bladder no complications were noted . Patient tolerated well.  Preformed by: C. Corinna Capra, CMA

## 2017-08-25 ENCOUNTER — Telehealth: Payer: Self-pay
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
## 2017-09-05 ENCOUNTER — Other Ambulatory Visit: Payer: Self-pay

## 2017-09-05 MED ORDER — TAMSULOSIN HCL 0.4 MG PO CAPS: 0.4000 mg | ORAL_CAPSULE | Freq: Every day | ORAL | 1 refills | Status: DC

## 2017-09-13 ENCOUNTER — Telehealth: Payer: Self-pay | Admitting: Urology
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
## 2017-09-15 DIAGNOSIS — M47816 Spondylosis without myelopathy or radiculopathy, lumbar region: Secondary | ICD-10-CM | POA: Diagnosis not present

## 2017-09-29 ENCOUNTER — Telehealth: Payer: Self-pay

## 2017-09-29 NOTE — Telephone Encounter (Signed)
Pt called stating the flomax is giving her nose sores. Reinforced with pt per Dr. Erlene Quan her nose sores are from another source. Reinforced with pt can use a humidifier to help with soreness or to call PCP. Pt voiced understanding.

## 2017-09-30 ENCOUNTER — Ambulatory Visit: Payer: Self-pay | Admitting: Nurse Practitioner

## 2017-09-30 DIAGNOSIS — L03031 Cellulitis of right toe: Secondary | ICD-10-CM | POA: Diagnosis not present

## 2017-09-30 DIAGNOSIS — N390 Urinary tract infection, site not specified: Secondary | ICD-10-CM | POA: Diagnosis not present

## 2017-09-30 DIAGNOSIS — A499 Bacterial infection, unspecified: Secondary | ICD-10-CM | POA: Diagnosis not present

## 2017-10-11 HISTORY — PX: MOUTH SURGERY: SHX715

## 2017-10-12 DIAGNOSIS — K6289 Other specified diseases of anus and rectum: Secondary | ICD-10-CM | POA: Diagnosis not present

## 2017-10-12 DIAGNOSIS — I10 Essential (primary) hypertension: Secondary | ICD-10-CM | POA: Diagnosis not present

## 2017-10-12 DIAGNOSIS — K649 Unspecified hemorrhoids: Secondary | ICD-10-CM | POA: Diagnosis not present

## 2017-10-12 DIAGNOSIS — Z885 Allergy status to narcotic agent status: Secondary | ICD-10-CM | POA: Diagnosis not present

## 2017-10-12 DIAGNOSIS — Z79899 Other long term (current) drug therapy: Secondary | ICD-10-CM | POA: Diagnosis not present

## 2017-10-12 DIAGNOSIS — F329 Major depressive disorder, single episode, unspecified: Secondary | ICD-10-CM | POA: Diagnosis not present

## 2017-10-12 DIAGNOSIS — Z7982 Long term (current) use of aspirin: Secondary | ICD-10-CM | POA: Diagnosis not present

## 2017-10-12 DIAGNOSIS — K5900 Constipation, unspecified: Secondary | ICD-10-CM | POA: Diagnosis not present

## 2017-10-12 DIAGNOSIS — R109 Unspecified abdominal pain: Secondary | ICD-10-CM | POA: Diagnosis not present

## 2017-10-12 DIAGNOSIS — K644 Residual hemorrhoidal skin tags: Secondary | ICD-10-CM | POA: Diagnosis not present

## 2017-10-12 DIAGNOSIS — Z880 Allergy status to penicillin: Secondary | ICD-10-CM | POA: Diagnosis not present

## 2017-10-12 DIAGNOSIS — H9190 Unspecified hearing loss, unspecified ear: Secondary | ICD-10-CM | POA: Diagnosis not present

## 2017-10-12 DIAGNOSIS — H3530 Unspecified macular degeneration: Secondary | ICD-10-CM | POA: Diagnosis not present

## 2017-10-12 DIAGNOSIS — Z888 Allergy status to other drugs, medicaments and biological substances status: Secondary | ICD-10-CM | POA: Diagnosis not present

## 2017-10-24 ENCOUNTER — Encounter: Payer: Self-pay | Admitting: Nurse Practitioner

## 2017-10-24 ENCOUNTER — Ambulatory Visit (INDEPENDENT_AMBULATORY_CARE_PROVIDER_SITE_OTHER): Payer: Medicare Other | Admitting: Nurse Practitioner

## 2017-10-24 VITALS — BP 122/79 | HR 93 | Ht 60.0 in | Wt 124.2 lb

## 2017-10-24 DIAGNOSIS — I10 Essential (primary) hypertension: Secondary | ICD-10-CM

## 2017-10-24 DIAGNOSIS — F411 Generalized anxiety disorder: Secondary | ICD-10-CM

## 2017-10-24 DIAGNOSIS — R5383 Other fatigue: Secondary | ICD-10-CM

## 2017-10-24 DIAGNOSIS — D696 Thrombocytopenia, unspecified: Secondary | ICD-10-CM | POA: Diagnosis not present

## 2017-10-24 DIAGNOSIS — K5900 Constipation, unspecified: Secondary | ICD-10-CM

## 2017-10-24 DIAGNOSIS — R0600 Dyspnea, unspecified: Secondary | ICD-10-CM | POA: Diagnosis not present

## 2017-10-24 DIAGNOSIS — E559 Vitamin D deficiency, unspecified: Secondary | ICD-10-CM

## 2017-10-24 DIAGNOSIS — K649 Unspecified hemorrhoids: Secondary | ICD-10-CM

## 2017-10-24 DIAGNOSIS — K5909 Other constipation: Secondary | ICD-10-CM | POA: Insufficient documentation

## 2017-10-24 DIAGNOSIS — Z0001 Encounter for general adult medical examination with abnormal findings: Secondary | ICD-10-CM | POA: Diagnosis not present

## 2017-10-24 DIAGNOSIS — R29818 Other symptoms and signs involving the nervous system: Secondary | ICD-10-CM | POA: Diagnosis not present

## 2017-10-24 DIAGNOSIS — K648 Other hemorrhoids: Secondary | ICD-10-CM | POA: Insufficient documentation

## 2017-10-24 MED ORDER — AMLODIPINE BESYLATE 2.5 MG PO TABS: 2.5000 mg | ORAL_TABLET | Freq: Every day | ORAL | 3 refills | Status: DC

## 2017-10-24 MED ORDER — LORAZEPAM 0.5 MG PO TABS: 0.5000 mg | ORAL_TABLET | Freq: Three times a day (TID) | ORAL | 1 refills | Status: DC | PRN

## 2017-10-24 NOTE — Progress Notes (Signed)
[facility], [place] [address]  Internal MEDICINE  Office Visit Note  Patient Name: [name]  [id number]  [id number]  Date of Service: 11/06/2017  Chief Complaint  Patient presents with  . Hemorrhoids  . Generalized Body Aches    wakes up in the morning and bowels move along with wanting to urinate; she also feels dizzy (one morning her BP was 152/101)   . Toe Pain    infected toe     Other  This is a chronic problem. The current episode started more than 1 year ago. The problem occurs constantly. The problem has been gradually worsening. Associated symptoms include fatigue, headaches and weakness. Pertinent negatives include no abdominal pain, arthralgias, chest pain, chills, congestion, coughing, joint swelling, nausea, neck pain, numbness, rash, sore throat or vomiting. Associated symptoms comments: Constipation. Has been unable to use hydrocortisone suppositories. They make her dizzy and shaky all over. . Exacerbated by: straining to use the bathroom. Treatments tried: suppositories, stool softeners, mild laxatives.    Pt is here for routine health maintenance examination  Current Medication: Outpatient Encounter Medications as of 10/24/2017  Medication Sig Note  . amLODipine (NORVASC) 2.5 MG tablet Take 1 tablet (2.5 mg total) by mouth daily.   . calcium carbonate (CALCIUM 600) 600 MG TABS tablet Take by mouth.   . carvedilol (COREG) 6.25 MG tablet Take 6.25 mg by mouth 2 (two) times daily with a meal.   . HYDROcodone-acetaminophen (NORCO) 10-325 MG tablet Take 1 tablet by mouth 3 (three) times daily.    [name] LORazepam (ATIVAN) 0.5 MG tablet Take 1 tablet (0.5 mg total) by mouth 3 (three) times daily as needed.   . mirtazapine (REMERON) 30 MG tablet Take 30 mg by mouth at bedtime.   . tamsulosin (FLOMAX) 0.4 MG CAPS capsule Take 1 capsule (0.4 mg total) by mouth daily.   . [DISCONTINUED] amLODipine (NORVASC) 5 MG tablet Take 0.5 tablets  (2.5 mg total) by mouth daily. (Patient taking differently: Take 5 mg by mouth daily. )   . [DISCONTINUED] LORazepam (ATIVAN) 0.5 MG tablet Take 0.5 mg by mouth 3 (three) times daily as needed.    . meloxicam (MOBIC) 7.5 MG tablet Take 7.5 mg by mouth daily as needed for pain.  07/25/2017: PRN   No facility-administered encounter medications on file as of 10/24/2017.     Surgical History: Past Surgical History:  Procedure Laterality Date  . ABDOMINAL HYSTERECTOMY    . APPENDECTOMY    . BACK SURGERY    . BREAST BIOPSY Right 2002   neg  . TONSILLECTOMY      Medical History: Past Medical History:  Diagnosis Date  . Allergy    environmental  . Anxiety   . Chronic back pain   . High cholesterol   . History of urinary retention   . Hypertension   . Migraines   . Osteoporosis   . Pneumonia   . Sinusitis     Family History: Family History  Problem Relation Age of Onset  . Breast cancer Sister 26  . Breast cancer Maternal Grandmother 49  . Breast cancer Sister 1  . Bladder Cancer Brother   . Hypertension Neg Hx   . Kidney cancer Neg Hx       Review of Systems  Constitutional: Positive for fatigue. Negative for chills and unexpected weight change.  HENT: Negative for congestion, postnasal drip, rhinorrhea, sneezing and sore throat.   Eyes: Negative for redness.  Respiratory: Negative for cough, chest tightness, shortness of breath and wheezing.   Cardiovascular: Positive for palpitations. Negative for chest pain.  Gastrointestinal: Positive for constipation. Negative for abdominal pain, diarrhea, nausea and vomiting.       Inflamed hemorrhoids  Endocrine: Negative for cold intolerance, heat intolerance, polydipsia, polyphagia and polyuria.  Genitourinary: Positive for dysuria and frequency.  Musculoskeletal: Negative for arthralgias, back pain, joint swelling and neck pain.  Skin: Negative for rash.  Allergic/Immunologic: Positive for environmental allergies. Negative  for food allergies and immunocompromised state.  Neurological: Positive for dizziness, weakness and headaches. Negative for tremors and numbness.  Hematological: Negative for adenopathy. Does not bruise/bleed easily.  Psychiatric/Behavioral: Negative for behavioral problems (Depression), sleep disturbance and suicidal ideas. The patient is nervous/anxious.      Today's Vitals   10/24/17 1611  BP: 122/79  Pulse: 93  SpO2: 93%  Weight: 124 lb 3.2 oz (56.3 kg)  Height: 5' (1.524 m)    Physical Exam  Constitutional: She is oriented to person, place, and time. She appears well-developed and well-nourished. No distress.  HENT:  Head: Normocephalic and atraumatic.  Right Ear: External ear normal.  Left Ear: External ear normal.  Mouth/Throat: Oropharynx is clear and moist. No oropharyngeal exudate.  Eyes: EOM are normal. Pupils are equal, round, and reactive to light.  Neck: Normal range of motion. Neck supple. No JVD present. Carotid bruit is not present. No tracheal deviation present. No thyromegaly present.  Cardiovascular: Normal rate, regular rhythm, normal heart sounds and normal pulses. Exam reveals no gallop and no friction rub.  No murmur heard. Pulmonary/Chest: Effort normal and breath sounds normal. No respiratory distress. She has no wheezes. She has no rales. She exhibits no tenderness.  Abdominal: Soft. Bowel sounds are normal. There is no hepatosplenomegaly. There is generalized tenderness. There is no CVA tenderness.  Musculoskeletal: Normal range of motion.  Lymphadenopathy:    She has no cervical adenopathy.  Neurological: She is alert and oriented to person, place, and time. She has normal reflexes. No cranial nerve deficit.  Skin: Skin is warm and dry. She is not diaphoretic.  Psychiatric: Her speech is normal and behavior is normal. Judgment and thought content normal. Her mood appears anxious. Cognition and memory are normal.     LABS: Recent Results (from the  past 2160 hour(s))  BLADDER SCAN AMB NON-IMAGING     Status: None   Collection Time: 08/22/17  3:52 PM  Result Value Ref Range   Scan Result 28ml   Urinalysis, Routine w reflex microscopic     Status: None   Collection Time: 10/24/17  5:11 PM  Result Value Ref Range   Specific Gravity, UA 1.007 1.005 - 1.030   pH, UA 7.5 5.0 - 7.5   Color, UA Yellow Yellow   Appearance Ur Clear Clear   Leukocytes, UA Negative Negative   Protein, UA Negative Negative/Trace   Glucose, UA Negative Negative   Ketones, UA Negative Negative   RBC, UA Negative Negative   Bilirubin, UA Negative Negative   Urobilinogen, Ur 0.2 0.2 - 1.0 mg/dL   Nitrite, UA Negative Negative   Microscopic Examination Comment     Comment: Microscopic not indicated and not performed.     Assessment/Plan:  1. Encounter for health maintenance examination with abnormal findings Annual wellness visit today. - Urinalysis, Routine w reflex microscopic - CBC With Differential - Comprehensive metabolic panel - TSH + free T4 - Iron, TIBC and Ferritin Panel - Vitamin B12 - Vitamin  D 1,25 dihydroxy - Hemoglobin A1c  2. Essential (primary) hypertension Continue norvasc every day. Refill provided.  - Comprehensive metabolic panel - TSH + free T4  3. Generalized anxiety disorder - LORazepam (ATIVAN) 0.5 MG tablet; Take 1 tablet (0.5 mg total) by mouth 3 (three) times daily as needed.  Dispense: 90 tablet; Refill: 1  4. Constipation, unspecified constipation type Continue stool softener every day. Ensure proper fiber intake.  - Ambulatory referral to General Surgery  5. Hemorrhoids, unspecified hemorrhoid type - Ambulatory referral to General Surgery  6. Thrombocytopenia (HCC) - CBC With Differential - Vitamin B12  7. Other symptoms and signs involving the nervous system - Comprehensive metabolic panel - TSH + free T4 - Hemoglobin A1c - MR Brain Wo Contrast  8. Dyspnea, unspecified type - DG Chest 2 View  9.  Other fatigue - Iron, TIBC and Ferritin Panel - Vitamin B12 - Hemoglobin A1c  10. Vitamin D deficiency - Vitamin D 1,25 dihydroxy  General Counseling: Tracina verbalizes understanding of the findings of todays visit and agrees with plan of treatment. I have discussed any further diagnostic evaluation that may be needed or ordered today. We also reviewed her medications today. she has been encouraged to call the office with any questions or concerns that should arise related to todays visit.   Orders Placed This Encounter  Procedures  . DG Chest 2 View  . MR Brain Wo Contrast  . Urinalysis, Routine w reflex microscopic  . CBC With Differential  . Comprehensive metabolic panel  . TSH + free T4  . Iron, TIBC and Ferritin Panel  . Vitamin B12  . Vitamin D 1,25 dihydroxy  . Hemoglobin A1c  . Ambulatory referral to General Surgery    Meds ordered this encounter  Medications  . LORazepam (ATIVAN) 0.5 MG tablet    Sig: Take 1 tablet (0.5 mg total) by mouth 3 (three) times daily as needed.    Dispense:  90 tablet    Refill:  1    Patient needs to fill early as she dropped several pills down her sink. This is ok for this time only.    Order Specific Question:   Supervising Provider    Answer:   Lavera Guise [[id number]]  . amLODipine (NORVASC) 2.5 MG tablet    Sig: Take 1 tablet (2.5 mg total) by mouth daily.    Dispense:  30 tablet    Refill:  3    Order Specific Question:   Supervising Provider    Answer:   Lavera Guise [[id number]]   This patient was seen by Leretha Pol, FNP- C in Collaboration with Dr Lavera Guise as a part of collaborative care agreement  Time spent: [place], MD  Internal Medicine

## 2017-10-25 LAB — URINALYSIS, ROUTINE W REFLEX MICROSCOPIC
BILIRUBIN UA: NEGATIVE
Glucose, UA: NEGATIVE
KETONES UA: NEGATIVE
LEUKOCYTES UA: NEGATIVE
Nitrite, UA: NEGATIVE
PH UA: 7.5 (ref 5.0–7.5)
PROTEIN UA: NEGATIVE
RBC UA: NEGATIVE
SPEC GRAV UA: 1.007 (ref 1.005–1.030)
UUROB: 0.2 mg/dL (ref 0.2–1.0)

## 2017-11-02 ENCOUNTER — Ambulatory Visit: Payer: Medicare Other

## 2017-11-10 ENCOUNTER — Ambulatory Visit (INDEPENDENT_AMBULATORY_CARE_PROVIDER_SITE_OTHER): Payer: Medicare Other | Admitting: General Surgery

## 2017-11-10 ENCOUNTER — Encounter: Payer: Self-pay | Admitting: General Surgery

## 2017-11-10 VITALS — BP 122/68 | HR 107 | Resp 16 | Ht 60.0 in | Wt 122.0 lb

## 2017-11-10 DIAGNOSIS — K648 Other hemorrhoids: Secondary | ICD-10-CM | POA: Diagnosis not present

## 2017-11-10 DIAGNOSIS — Z87898 Personal history of other specified conditions: Secondary | ICD-10-CM | POA: Diagnosis not present

## 2017-11-10 NOTE — Patient Instructions (Addendum)
The patient is aware to call back for any questions or new concerns. Recommend miralax on a daily basis may adjust as needed.

## 2017-11-10 NOTE — Progress Notes (Signed)
Patient ID: [name], female   DOB: May 31, 1925, 84 y.o.   MRN: [id number]  Chief Complaint  Patient presents with  . Other    HPI KESSLER KOPINSKI is a 84 y.o. female here today for a evaluation of a hemorrhoids referred by Leretha Pol. Her bowels move every 2-3 days with stool softeners. She is having a dull rectal pain for 2-3 months. Denies any bleeding. She is hard of hearing and has trouble seeing. She has been using preparation H, she can not tolerate suppositories with cortisone. She is in a wheelchair and has a history of back surgeriy. She is here with her daughter, Avon Gully. CT scan 08-04-17.  HPI  Past Medical History:  Diagnosis Date  . Allergy    environmental  . Anxiety   . Chronic back pain   . High cholesterol   . History of urinary retention   . Hypertension   . Migraines   . Osteoporosis   . Pneumonia   . Sinusitis     Past Surgical History:  Procedure Laterality Date  . ABDOMINAL HYSTERECTOMY    . APPENDECTOMY    . BACK SURGERY    . BREAST BIOPSY Right 2002   neg  . HEMORRHOID SURGERY    . TONSILLECTOMY      Family History  Problem Relation Age of Onset  . Breast cancer Sister 11  . Breast cancer Maternal Grandmother 66  . Breast cancer Sister 27  . Bladder Cancer Brother   . Hypertension Neg Hx   . Kidney cancer Neg Hx     Social History Social History   Tobacco Use  . Smoking status: Never Smoker  . Smokeless tobacco: Never Used  Substance Use Topics  . Alcohol use: No  . Drug use: No    Allergies  Allergen Reactions  . Ceftin [Cefuroxime]   . Cortisone Other (See Comments)    Headache   . Fluticasone Swelling    Facial  . Levofloxacin Nausea And Vomiting  . Meperidine Other (See Comments)  . Morphine And Related Other (See Comments)    headache  . Morphine Sulfate   . Nisoldipine   . Penicillin G Potassium In D5w   . Prednisone Other (See Comments)    Headache   . Sulfa Antibiotics Nausea Only     Current Outpatient Medications  Medication Sig Dispense Refill  . amLODipine (NORVASC) 2.5 MG tablet Take 1 tablet (2.5 mg total) by mouth daily. 30 tablet 3  . calcium carbonate (CALCIUM 600) 600 MG TABS tablet Take by mouth.    . carvedilol (COREG) 6.25 MG tablet Take 6.25 mg by mouth 2 (two) times daily with a meal.    . HYDROcodone-acetaminophen (NORCO) 10-325 MG tablet Take 1 tablet by mouth 3 (three) times daily.     [name] LORazepam (ATIVAN) 0.5 MG tablet Take 1 tablet (0.5 mg total) by mouth 3 (three) times daily as needed. 90 tablet 1  . meloxicam (MOBIC) 7.5 MG tablet Take 7.5 mg by mouth daily as needed for pain.     . mirtazapine (REMERON) 30 MG tablet Take 30 mg by mouth at bedtime.    . tamsulosin (FLOMAX) 0.4 MG CAPS capsule Take 1 capsule (0.4 mg total) by mouth daily. 30 capsule 1   No current facility-administered medications for this visit.     Review of Systems Review of Systems  Constitutional: Positive for chills.  Respiratory: Negative.   Cardiovascular: Negative.   Gastrointestinal: Positive for constipation  and rectal pain. Negative for anal bleeding and diarrhea.  Neurological: Positive for headaches.    Blood pressure 122/68, pulse (!) 107, resp. rate 16, height 5' (1.524 m), weight 122 lb (55.3 kg).  Physical Exam Physical Exam  Constitutional: She is oriented to person, place, and time. She appears well-developed and well-nourished.  Genitourinary: Rectal exam shows internal hemorrhoid. Rectal exam shows no mass, no tenderness and anal tone normal.     Genitourinary Comments: Palpation of the coccyx mildly tender.  No spasm of the levator muscles.  Neurological: She is alert and oriented to person, place, and time.  Skin: Skin is warm and dry.  Psychiatric: Her behavior is normal.  Digital rectal exam was undertaken having made use of 5 cc of 2% Xylocaine jelly.  Normal sphincter tone.  No evidence of fissure.  No palpable masses within reach of the  examining finger.  4 quadrant anoscopy notable only for small, anterior nonbleeding internal hemorrhoids.  Data Reviewed CT scan of the abdomen and pelvis dated June 15, 2017 and August 04, 2017 reviewed.  Evidence of urinary retention with the bladder to the level of the iliac crest.  11 mm left mid body lesion in the left kidney.  Special attention review of the pelvic musculature shows no asymmetry or mass-effect.  No rectal masses noted.  Assessment    No source for rectal pain.    Plan The patient is a long history of obstipation.  She has been make use of stool softeners.  Bowel movements every 2-3 days.  She may benefit from making use of a capful of MiraLAX daily and a full glass of water.  As she has been found to be in urinary retention with postvoid residuals up to 700 cc, this may have recurred and be causing some of her pelvic discomfort.  No primary pathology of the anorectal area is noted on today's exam. Recommend Urology to reevaluate. Recommend miralax on a daily basis may adjust as needed. The patient is aware to call back for any questions or new concerns. Follow up as needed.   HPI, Physical Exam, Assessment and Plan have been scribed under the direction and in the presence of Robert Bellow, MD. Karie Fetch, RN  I have completed the exam and reviewed the above documentation for accuracy and completeness.  I agree with the above.  Haematologist has been used and any errors in dictation or transcription are unintentional.  Hervey Ard, M.D., F.A.C.S.   Forest Gleason Ivery Nanney 11/10/2017, 3:26 PM

## 2017-11-14 ENCOUNTER — Other Ambulatory Visit: Payer: Self-pay | Admitting: Internal Medicine

## 2017-11-17 ENCOUNTER — Telehealth: Payer: Self-pay | Admitting: Urology

## 2017-11-17 MED ORDER — TAMSULOSIN HCL 0.4 MG PO CAPS: 0.4000 mg | ORAL_CAPSULE | Freq: Every day | ORAL | 3 refills | Status: DC

## 2017-11-17 NOTE — Telephone Encounter (Signed)
Refills sent to pharmacy. 

## 2017-11-17 NOTE — Telephone Encounter (Signed)
Pt called office only has 2 tamsulosin left, pt states she really has to have this medication. Asking to please refill asap. [employer] [facility] in [place]. Please let pt know when this has been sent in. Please advise. Thanks.

## 2017-11-18 ENCOUNTER — Other Ambulatory Visit: Payer: Self-pay | Admitting: Nurse Practitioner

## 2017-11-18 DIAGNOSIS — R609 Edema, unspecified: Secondary | ICD-10-CM

## 2017-11-18 MED ORDER — HYDROCHLOROTHIAZIDE 12.5 MG PO TABS: 12.5000 mg | ORAL_TABLET | Freq: Every day | ORAL | 2 refills | Status: DC

## 2017-11-18 NOTE — Progress Notes (Signed)
Added hctz 12.5mg  tablets daily to help with swelling. Prescription sent to [facility].

## 2017-11-24 DIAGNOSIS — J340 Abscess, furuncle and carbuncle of nose: Secondary | ICD-10-CM | POA: Diagnosis not present

## 2017-11-29 ENCOUNTER — Encounter: Payer: Self-pay | Admitting: Nurse Practitioner

## 2017-11-29 ENCOUNTER — Ambulatory Visit (INDEPENDENT_AMBULATORY_CARE_PROVIDER_SITE_OTHER): Payer: Medicare Other | Admitting: Nurse Practitioner

## 2017-11-29 VITALS — BP 116/75 | HR 93 | Resp 16 | Ht 60.0 in | Wt 127.0 lb

## 2017-11-29 DIAGNOSIS — K5900 Constipation, unspecified: Secondary | ICD-10-CM | POA: Diagnosis not present

## 2017-11-29 DIAGNOSIS — K648 Other hemorrhoids: Secondary | ICD-10-CM

## 2017-11-29 DIAGNOSIS — F411 Generalized anxiety disorder: Secondary | ICD-10-CM

## 2017-11-29 DIAGNOSIS — I10 Essential (primary) hypertension: Secondary | ICD-10-CM | POA: Diagnosis not present

## 2017-11-29 DIAGNOSIS — R601 Generalized edema: Secondary | ICD-10-CM

## 2017-11-29 MED ORDER — LORAZEPAM 0.5 MG PO TABS: 0.5000 mg | ORAL_TABLET | Freq: Three times a day (TID) | ORAL | 2 refills | Status: DC | PRN

## 2017-11-29 MED ORDER — MIRTAZAPINE 30 MG PO TABS: 30.0000 mg | ORAL_TABLET | Freq: Every day | ORAL | 2 refills | Status: DC

## 2017-11-29 MED ORDER — AMLODIPINE BESYLATE 2.5 MG PO TABS: 2.5000 mg | ORAL_TABLET | Freq: Every day | ORAL | 3 refills | Status: DC

## 2017-11-29 MED ORDER — FUROSEMIDE 20 MG PO TABS: ORAL_TABLET | ORAL | 2 refills | Status: DC

## 2017-11-29 NOTE — Progress Notes (Signed)
[facility] [place], [place] [address]  Internal MEDICINE  Office Visit Note  Patient Name: ALIEAH BRINTON  [id number]  [id number]  Date of Service: 12/10/2017  No chief complaint on file.   Other  This is a chronic problem. The current episode started more than 1 year ago. The problem occurs constantly. The problem has been gradually worsening. Associated symptoms include abdominal pain, fatigue, headaches and weakness. Pertinent negatives include no arthralgias, chest pain, chills, congestion, coughing, joint swelling, nausea, neck pain, numbness, rash, sore throat or vomiting. Associated symptoms comments: Constipation. Has been unable to use hydrocortisone suppositories. They make her dizzy and shaky all over. . The symptoms are aggravated by standing, walking and stress (straining to use the bathroom.). She has tried rest, oral narcotics and lying down (suppositories, stool softeners, mild laxatives. ) for the symptoms. The treatment provided mild relief.    Pt is here for routine follow up.    Current Medication: Outpatient Encounter Medications as of 11/29/2017  Medication Sig Note  . amLODipine (NORVASC) 2.5 MG tablet Take 1 tablet (2.5 mg total) by mouth daily.   . calcium carbonate (CALCIUM 600) 600 MG TABS tablet Take by mouth.   . carvedilol (COREG) 6.25 MG tablet Take 6.25 mg by mouth 2 (two) times daily with a meal.   . furosemide (LASIX) 20 MG tablet Take 1 tablet po QD prn   . HYDROcodone-acetaminophen (NORCO) 10-325 MG tablet Take 1 tablet by mouth 3 (three) times daily.    [name] LORazepam (ATIVAN) 0.5 MG tablet Take 1 tablet (0.5 mg total) by mouth 3 (three) times daily as needed for anxiety.   . meloxicam (MOBIC) 7.5 MG tablet Take 7.5 mg by mouth daily as needed for pain.  07/25/2017: PRN  . mirtazapine (REMERON) 30 MG tablet Take 1 tablet (30 mg total) by mouth at bedtime.   . tamsulosin (FLOMAX) 0.4 MG CAPS capsule Take 1 capsule (0.4 mg  total) by mouth daily.   . [DISCONTINUED] amLODipine (NORVASC) 2.5 MG tablet Take 1 tablet (2.5 mg total) by mouth daily. 11/29/2017: change from 5mg  to 2.5mg  due to leg swelling.   . [DISCONTINUED] amLODipine (NORVASC) 5 MG tablet TAKE 1 TABLET DAILY. 11/29/2017: change from 5mg  to 2.5mg  due to leg swelling.   . [DISCONTINUED] hydrochlorothiazide (HYDRODIURIL) 12.5 MG tablet Take 1 tablet (12.5 mg total) by mouth daily. 11/29/2017: caused nausea  . [DISCONTINUED] LORazepam (ATIVAN) 0.5 MG tablet Take 1 tablet (0.5 mg total) by mouth 3 (three) times daily as needed.   . [DISCONTINUED] mirtazapine (REMERON) 30 MG tablet Take 30 mg by mouth at bedtime.    No facility-administered encounter medications on file as of 11/29/2017.     Surgical History: Past Surgical History:  Procedure Laterality Date  . ABDOMINAL HYSTERECTOMY    . APPENDECTOMY    . BACK SURGERY    . BREAST BIOPSY Right 2002   neg  . HEMORRHOID SURGERY    . TONSILLECTOMY      Medical History: Past Medical History:  Diagnosis Date  . Allergy    environmental  . Anxiety   . Chronic back pain   . High cholesterol   . History of urinary retention   . Hypertension   . Migraines   . Osteoporosis   . Pneumonia   . Sinusitis     Family History: Family History  Problem Relation Age of Onset  . Breast cancer Sister 85  . Breast cancer Maternal Grandmother 10  .  Breast cancer Sister 73  . Bladder Cancer Brother   . Hypertension Neg Hx   . Kidney cancer Neg Hx     Social History   Socioeconomic History  . Marital status: Widowed    Spouse name: Not on file  . Number of children: Not on file  . Years of education: Not on file  . Highest education level: Not on file  Social Needs  . Financial resource strain: Not on file  . Food insecurity - worry: Not on file  . Food insecurity - inability: Not on file  . Transportation needs - medical: Not on file  . Transportation needs - non-medical: Not on file   Occupational History  . Occupation: retired  Tobacco Use  . Smoking status: Never Smoker  . Smokeless tobacco: Never Used  Substance and Sexual Activity  . Alcohol use: No  . Drug use: No  . Sexual activity: Not on file  Other Topics Concern  . Not on file  Social History Narrative  . Not on file      Review of Systems  Constitutional: Positive for fatigue. Negative for activity change, chills and unexpected weight change.  HENT: Positive for postnasal drip and rhinorrhea. Negative for congestion, sneezing and sore throat.   Eyes: Negative.  Negative for redness.  Respiratory: Negative for cough, chest tightness, shortness of breath and wheezing.   Cardiovascular: Positive for palpitations. Negative for chest pain.  Gastrointestinal: Positive for abdominal pain and constipation. Negative for diarrhea, nausea and vomiting.       Inflamed hemorrhoids  Endocrine: Negative for cold intolerance, heat intolerance, polydipsia, polyphagia and polyuria.  Genitourinary: Positive for dysuria and frequency.  Musculoskeletal: Positive for back pain. Negative for arthralgias, joint swelling and neck pain.  Skin: Negative for rash.  Allergic/Immunologic: Positive for environmental allergies. Negative for food allergies and immunocompromised state.  Neurological: Positive for dizziness, weakness and headaches. Negative for tremors and numbness.  Hematological: Negative for adenopathy. Does not bruise/bleed easily.  Psychiatric/Behavioral: Positive for agitation. Negative for behavioral problems (Depression), sleep disturbance and suicidal ideas. The patient is nervous/anxious.     Today's Vitals   11/29/17 1556  BP: 116/75  Pulse: 93  Resp: 16  SpO2: 95%  Weight: 127 lb (57.6 kg)  Height: 5' (1.524 m)    Physical Exam  Constitutional: She is oriented to person, place, and time. She appears well-developed and well-nourished. No distress.  HENT:  Head: Normocephalic and atraumatic.   Right Ear: External ear normal.  Left Ear: External ear normal.  Mouth/Throat: Oropharynx is clear and moist. No oropharyngeal exudate.  Eyes: EOM are normal. Pupils are equal, round, and reactive to light.  Neck: Normal range of motion. Neck supple. No JVD present. Carotid bruit is not present. No tracheal deviation present. No thyromegaly present.  Cardiovascular: Normal rate, regular rhythm, normal heart sounds and normal pulses. Exam reveals no gallop and no friction rub.  No murmur heard. Severe varicose veins, bilaterally. Tenderness with palpation of both legs.  Mild to moderate swelling of bboth lower legs.   Pulmonary/Chest: Effort normal and breath sounds normal. No respiratory distress. She has no wheezes. She has no rales. She exhibits no tenderness.  Abdominal: Soft. Bowel sounds are normal. There is no hepatosplenomegaly. There is generalized tenderness. There is no CVA tenderness.  Musculoskeletal: Normal range of motion.  Lymphadenopathy:    She has no cervical adenopathy.  Neurological: She is alert and oriented to person, place, and time. She has normal reflexes.  No cranial nerve deficit.  Skin: Skin is warm and dry. She is not diaphoretic.  Psychiatric: Her speech is normal and behavior is normal. Judgment and thought content normal. Her mood appears anxious. Cognition and memory are normal.  Nursing note and vitals reviewed.   Assessment/Plan:  1. Essential hypertension - amLODipine (NORVASC) 2.5 MG tablet; Take 1 tablet (2.5 mg total) by mouth daily.  Dispense: 30 tablet; Refill: 3  2. Generalized anxiety disorder May continue to take lorazepam as needed and as prescribed. Continue mirtazapine every eveninnng.  - LORazepam (ATIVAN) 0.5 MG tablet; Take 1 tablet (0.5 mg total) by mouth 3 (three) times daily as needed for anxiety.  Dispense: 90 tablet; Refill: 2 - mirtazapine (REMERON) 30 MG tablet; Take 1 tablet (30 mg total) by mouth at bedtime.  Dispense: 30 tablet;  Refill: 2  3. Generalized edema Will try furosemide 20mg  every morning as needed. Patient still to have labs drawn for further evaluation.  - furosemide (LASIX) 20 MG tablet; Take 1 tablet po QD prn  Dispense: 30 tablet; Refill: 2  4. Constipation, unspecified constipation type advissed patient and her daughter to contineu bowel regimen as recommended per surgery.   5. Internal hemorrhoids Reviewed progress notes from surgery. Will continue to monitor.   General Counseling: elliotte marsalis understanding of the findings of todays visit and agrees with plan of treatment. I have discussed any further diagnostic evaluation that may be needed or ordered today. We also reviewed her medications today. she has been encouraged to call the office with any questions or concerns that should arise related to todays visit.   This patient was seen by Leretha Pol, FNP- C in Collaboration with Dr Lavera Guise as a part of collaborative care agreement    Meds ordered this encounter  Medications  . amLODipine (NORVASC) 2.5 MG tablet    Sig: Take 1 tablet (2.5 mg total) by mouth daily.    Dispense:  30 tablet    Refill:  3    Order Specific Question:   Supervising Provider    Answer:   Lavera Guise [[id number]]  . LORazepam (ATIVAN) 0.5 MG tablet    Sig: Take 1 tablet (0.5 mg total) by mouth 3 (three) times daily as needed for anxiety.    Dispense:  90 tablet    Refill:  2    Order Specific Question:   Supervising Provider    Answer:   Lavera Guise [[id number]]  . mirtazapine (REMERON) 30 MG tablet    Sig: Take 1 tablet (30 mg total) by mouth at bedtime.    Dispense:  30 tablet    Refill:  2    Order Specific Question:   Supervising Provider    Answer:   Lavera Guise [[id number]]  . furosemide (LASIX) 20 MG tablet    Sig: Take 1 tablet po QD prn    Dispense:  30 tablet    Refill:  2    Order Specific Question:   Supervising Provider    Answer:   Lavera Guise [[id number]]    Time spent: 73  Minutes          Dr Lavera Guise Internal medicine

## 2017-12-20 ENCOUNTER — Other Ambulatory Visit: Payer: Self-pay

## 2017-12-20 ENCOUNTER — Telehealth: Payer: Self-pay

## 2017-12-20 DIAGNOSIS — Z6824 Body mass index (BMI) 24.0-24.9, adult: Secondary | ICD-10-CM | POA: Diagnosis not present

## 2017-12-20 DIAGNOSIS — M47816 Spondylosis without myelopathy or radiculopathy, lumbar region: Secondary | ICD-10-CM | POA: Diagnosis not present

## 2017-12-20 MED ORDER — CEPHALEXIN 500 MG PO CAPS: ORAL_CAPSULE | ORAL | 0 refills | Status: DC

## 2017-12-20 NOTE — Telephone Encounter (Signed)
Pt called she had uti as per heather send cephalexin /np

## 2017-12-20 NOTE — Telephone Encounter (Signed)
Patient has been informed her ABX has been called into pharmacy/ BR

## 2017-12-26 ENCOUNTER — Other Ambulatory Visit: Payer: Self-pay

## 2017-12-26 DIAGNOSIS — I10 Essential (primary) hypertension: Secondary | ICD-10-CM

## 2017-12-26 MED ORDER — AMLODIPINE BESYLATE 2.5 MG PO TABS: 2.5000 mg | ORAL_TABLET | Freq: Every day | ORAL | 3 refills | Status: DC

## 2018-01-03 ENCOUNTER — Encounter: Payer: Self-pay | Admitting: Internal Medicine

## 2018-01-03 ENCOUNTER — Emergency Department
Admission: EM | Admit: 2018-01-03 | Discharge: 2018-01-03 | Disposition: A | Discharge status: Home / Self Care | Payer: Medicare Other | Attending: Emergency Medicine | Admitting: Emergency Medicine

## 2018-01-03 ENCOUNTER — Emergency Department: Payer: Medicare Other

## 2018-01-03 ENCOUNTER — Other Ambulatory Visit: Payer: Self-pay

## 2018-01-03 ENCOUNTER — Ambulatory Visit (INDEPENDENT_AMBULATORY_CARE_PROVIDER_SITE_OTHER): Payer: Medicare Other | Admitting: Internal Medicine

## 2018-01-03 VITALS — BP 105/76 | HR 116 | Temp 97.3°F | Resp 16 | Ht 60.0 in | Wt 124.0 lb

## 2018-01-03 DIAGNOSIS — M1990 Unspecified osteoarthritis, unspecified site: Secondary | ICD-10-CM | POA: Insufficient documentation

## 2018-01-03 DIAGNOSIS — R609 Edema, unspecified: Secondary | ICD-10-CM | POA: Diagnosis not present

## 2018-01-03 DIAGNOSIS — J439 Emphysema, unspecified: Secondary | ICD-10-CM | POA: Insufficient documentation

## 2018-01-03 DIAGNOSIS — Z79899 Other long term (current) drug therapy: Secondary | ICD-10-CM | POA: Diagnosis not present

## 2018-01-03 DIAGNOSIS — R509 Fever, unspecified: Secondary | ICD-10-CM

## 2018-01-03 DIAGNOSIS — M810 Age-related osteoporosis without current pathological fracture: Secondary | ICD-10-CM | POA: Insufficient documentation

## 2018-01-03 DIAGNOSIS — S22000A Wedge compression fracture of unspecified thoracic vertebra, initial encounter for closed fracture: Secondary | ICD-10-CM | POA: Insufficient documentation

## 2018-01-03 DIAGNOSIS — R2243 Localized swelling, mass and lump, lower limb, bilateral: Secondary | ICD-10-CM | POA: Insufficient documentation

## 2018-01-03 DIAGNOSIS — R000 Tachycardia, unspecified: Secondary | ICD-10-CM | POA: Diagnosis not present

## 2018-01-03 DIAGNOSIS — I10 Essential (primary) hypertension: Secondary | ICD-10-CM | POA: Insufficient documentation

## 2018-01-03 DIAGNOSIS — M179 Osteoarthritis of knee, unspecified: Secondary | ICD-10-CM | POA: Insufficient documentation

## 2018-01-03 DIAGNOSIS — M1710 Unilateral primary osteoarthritis, unspecified knee: Secondary | ICD-10-CM | POA: Insufficient documentation

## 2018-01-03 DIAGNOSIS — R600 Localized edema: Secondary | ICD-10-CM | POA: Diagnosis not present

## 2018-01-03 DIAGNOSIS — M79662 Pain in left lower leg: Secondary | ICD-10-CM | POA: Diagnosis not present

## 2018-01-03 DIAGNOSIS — N926 Irregular menstruation, unspecified: Secondary | ICD-10-CM | POA: Insufficient documentation

## 2018-01-03 DIAGNOSIS — F419 Anxiety disorder, unspecified: Secondary | ICD-10-CM | POA: Diagnosis not present

## 2018-01-03 DIAGNOSIS — K922 Gastrointestinal hemorrhage, unspecified: Secondary | ICD-10-CM | POA: Insufficient documentation

## 2018-01-03 DIAGNOSIS — S93409A Sprain of unspecified ligament of unspecified ankle, initial encounter: Secondary | ICD-10-CM | POA: Insufficient documentation

## 2018-01-03 DIAGNOSIS — M47816 Spondylosis without myelopathy or radiculopathy, lumbar region: Secondary | ICD-10-CM | POA: Insufficient documentation

## 2018-01-03 DIAGNOSIS — M25561 Pain in right knee: Secondary | ICD-10-CM | POA: Insufficient documentation

## 2018-01-03 DIAGNOSIS — K219 Gastro-esophageal reflux disease without esophagitis: Secondary | ICD-10-CM | POA: Insufficient documentation

## 2018-01-03 DIAGNOSIS — M79661 Pain in right lower leg: Secondary | ICD-10-CM | POA: Diagnosis not present

## 2018-01-03 DIAGNOSIS — M7120 Synovial cyst of popliteal space [Baker], unspecified knee: Secondary | ICD-10-CM | POA: Insufficient documentation

## 2018-01-03 DIAGNOSIS — R300 Dysuria: Secondary | ICD-10-CM

## 2018-01-03 DIAGNOSIS — S42302A Unspecified fracture of shaft of humerus, left arm, initial encounter for closed fracture: Secondary | ICD-10-CM | POA: Insufficient documentation

## 2018-01-03 DIAGNOSIS — M7989 Other specified soft tissue disorders: Secondary | ICD-10-CM | POA: Diagnosis not present

## 2018-01-03 LAB — POCT URINALYSIS DIPSTICK
Bilirubin, UA: NEGATIVE
GLUCOSE UA: NEGATIVE
KETONES UA: NEGATIVE
Leukocytes, UA: NEGATIVE
Nitrite, UA: NEGATIVE
Protein, UA: NEGATIVE
RBC UA: NEGATIVE
SPEC GRAV UA: 1.01 (ref 1.010–1.025)
Urobilinogen, UA: 0.2 E.U./dL
pH, UA: 7 (ref 5.0–8.0)

## 2018-01-03 LAB — CBC
HCT: 42.7 % (ref 35.0–47.0)
Hemoglobin: 13.9 g/dL (ref 12.0–16.0)
MCH: 29.4 pg (ref 26.0–34.0)
MCHC: 32.6 g/dL (ref 32.0–36.0)
MCV: 90.3 fL (ref 80.0–100.0)
PLATELETS: 192 10*3/uL (ref 150–440)
RBC: 4.74 MIL/uL (ref 3.80–5.20)
RDW: 14.1 % (ref 11.5–14.5)
WBC: 7.1 10*3/uL (ref 3.6–11.0)

## 2018-01-03 LAB — COMPREHENSIVE METABOLIC PANEL
ALT: 16 U/L (ref 14–54)
AST: 28 U/L (ref 15–41)
Albumin: 4.5 g/dL (ref 3.5–5.0)
Alkaline Phosphatase: 67 U/L (ref 38–126)
Anion gap: 9 (ref 5–15)
BILIRUBIN TOTAL: 0.9 mg/dL (ref 0.3–1.2)
BUN: 16 mg/dL (ref 6–20)
CO2: 25 mmol/L (ref 22–32)
CREATININE: 0.62 mg/dL (ref 0.44–1.00)
Calcium: 10.1 mg/dL (ref 8.9–10.3)
Chloride: 99 mmol/L — ABNORMAL LOW (ref 101–111)
GFR calc Af Amer: >60 mL/min (ref >60)
Glucose, Bld: 106 mg/dL — ABNORMAL HIGH (ref 65–99)
Potassium: 3.9 mmol/L (ref 3.5–5.1)
Sodium: 133 mmol/L — ABNORMAL LOW (ref 135–145)
TOTAL PROTEIN: 7.5 g/dL (ref 6.5–8.1)

## 2018-01-03 LAB — URINE DRUG SCREEN, QUALITATIVE (ARMC ONLY)
Amphetamines, Ur Screen: NOT DETECTED
BARBITURATES, UR SCREEN: NOT DETECTED
Benzodiazepine, Ur Scrn: POSITIVE — AB
COCAINE METABOLITE, UR ~~LOC~~: NOT DETECTED
Cannabinoid 50 Ng, Ur ~~LOC~~: NOT DETECTED
MDMA (ECSTASY) UR SCREEN: NOT DETECTED
METHADONE SCREEN, URINE: NOT DETECTED
Opiate, Ur Screen: POSITIVE — AB
Phencyclidine (PCP) Ur S: NOT DETECTED
TRICYCLIC, UR SCREEN: NOT DETECTED

## 2018-01-03 LAB — LIPASE, BLOOD: Lipase: 32 U/L (ref 11–51)

## 2018-01-03 LAB — URINALYSIS, COMPLETE (UACMP) WITH MICROSCOPIC
Bacteria, UA: NONE SEEN
Bilirubin Urine: NEGATIVE
Glucose, UA: NEGATIVE mg/dL
Ketones, ur: NEGATIVE mg/dL
LEUKOCYTES UA: NEGATIVE
NITRITE: NEGATIVE
PH: 7 (ref 5.0–8.0)
Protein, ur: NEGATIVE mg/dL
SPECIFIC GRAVITY, URINE: 1.008 (ref 1.005–1.030)
Squamous Epithelial / LPF: NONE SEEN
WBC, UA: NONE SEEN WBC/hpf (ref 0–5)

## 2018-01-03 LAB — BRAIN NATRIURETIC PEPTIDE: B NATRIURETIC PEPTIDE 5: 111 pg/mL — AB (ref 0.0–100.0)

## 2018-01-03 LAB — LACTIC ACID, PLASMA: LACTIC ACID, VENOUS: 1.1 mmol/L (ref 0.5–1.9)

## 2018-01-03 LAB — INFLUENZA PANEL BY PCR (TYPE A & B)
INFLAPCR: NEGATIVE
Influenza B By PCR: NEGATIVE

## 2018-01-03 NOTE — Progress Notes (Signed)
[facility] [place], [place] [address]  Internal MEDICINE  Office Visit Note  Patient Name: [name]  [id number]  [id number]  Date of Service: 01/03/2018  Chief Complaint  Patient presents with  . Edema    both legs, painful  . Cough    congestion pale yellow mucus, chest hurting  . Urinary Tract Infection    hurts with urination    HPI  Pt is here for routine follow up. Pt looks very uncomfortable and disheveled. Daughter in the room with her.Pt started having these symptoms this am    Current Medication: Outpatient Encounter Medications as of 01/03/2018  Medication Sig Note  . amLODipine (NORVASC) 2.5 MG tablet Take 1 tablet (2.5 mg total) by mouth daily.   . calcium carbonate (CALCIUM 600) 600 MG TABS tablet Take by mouth.   . carvedilol (COREG) 6.25 MG tablet Take 6.25 mg by mouth 2 (two) times daily with a meal.   . cephALEXin (KEFLEX) 500 MG capsule take 1 cap po tid for 7 days   . furosemide (LASIX) 20 MG tablet Take 1 tablet po QD prn   . HYDROcodone-acetaminophen (NORCO) 10-325 MG tablet Take 1 tablet by mouth 3 (three) times daily.    [name] LORazepam (ATIVAN) 0.5 MG tablet Take 1 tablet (0.5 mg total) by mouth 3 (three) times daily as needed for anxiety.   . meloxicam (MOBIC) 7.5 MG tablet Take 7.5 mg by mouth daily as needed for pain.  07/25/2017: PRN  . mirtazapine (REMERON) 30 MG tablet Take 1 tablet (30 mg total) by mouth at bedtime.   . tamsulosin (FLOMAX) 0.4 MG CAPS capsule Take 1 capsule (0.4 mg total) by mouth daily.    No facility-administered encounter medications on file as of 01/03/2018.     Surgical History: Past Surgical History:  Procedure Laterality Date  . ABDOMINAL HYSTERECTOMY    . APPENDECTOMY    . BACK SURGERY    . BREAST BIOPSY Right 2002   neg  . HEMORRHOID SURGERY    . TONSILLECTOMY      Medical History: Past Medical History:  Diagnosis Date  . Allergy    environmental  . Anxiety   . Chronic back  pain   . High cholesterol   . History of urinary retention   . Hypertension   . Migraines   . Osteoporosis   . Pneumonia   . Sinusitis     Family History: Family History  Problem Relation Age of Onset  . Breast cancer Sister 52  . Breast cancer Maternal Grandmother 19  . Breast cancer Sister 40  . Bladder Cancer Brother   . Hypertension Neg Hx   . Kidney cancer Neg Hx     Social History   Socioeconomic History  . Marital status: Widowed    Spouse name: Not on file  . Number of children: Not on file  . Years of education: Not on file  . Highest education level: Not on file  Occupational History  . Occupation: retired  Scientific laboratory technician  . Financial resource strain: Not on file  . Food insecurity:    Worry: Not on file    Inability: Not on file  . Transportation needs:    Medical: Not on file    Non-medical: Not on file  Tobacco Use  . Smoking status: Never Smoker  . Smokeless tobacco: Never Used  Substance and Sexual Activity  . Alcohol use: No  . Drug use: No  .  Sexual activity: Not on file  Lifestyle  . Physical activity:    Days per week: Not on file    Minutes per session: Not on file  . Stress: Not on file  Relationships  . Social connections:    Talks on phone: Not on file    Gets together: Not on file    Attends religious service: Not on file    Active member of club or organization: Not on file    Attends meetings of clubs or organizations: Not on file    Relationship status: Not on file  . Intimate partner violence:    Fear of current or ex partner: Not on file    Emotionally abused: Not on file    Physically abused: Not on file    Forced sexual activity: Not on file  Other Topics Concern  . Not on file  Social History Narrative  . Not on file   Review of Systems  Constitutional: Positive for activity change, appetite change, chills and fatigue.  Cardiovascular: Positive for leg swelling.  Genitourinary: Positive for dysuria and pelvic pain.   Musculoskeletal: Positive for arthralgias.    Vital Signs: BP 105/76 (BP Location: Left Arm, Patient Position: Sitting)   Pulse (!) 116   Temp (!) 97.3 F (36.3 C) (Oral)   Resp 16   Ht 5' (1.524 m)   Wt 124 lb (56.2 kg)   SpO2 95%   BMI 24.22 kg/m    Physical Exam  Constitutional: She appears distressed.  HENT:  Head: Normocephalic.  Neck: Normal range of motion.  Cardiovascular: Normal rate.  Musculoskeletal: She exhibits edema.  Skin: She is diaphoretic.    Assessment/Plan: 1. Dysuria - POCT Urinalysis Dipstick  2. Fever and chills - POCT Urinalysis Dipstick  General Counseling: [name] verbalizes understanding of the findings of todays visit and agrees with plan of treatment. I have discussed any further diagnostic evaluation that may be needed or ordered today. We also reviewed her medications today. she has been encouraged to call the office with any questions or concerns that should arise related to todays visit. Pt is sent to ED for further evaluation.   Orders Placed This Encounter  Procedures  . POCT Urinalysis Dipstick    Time spent:15 Minutes  Dr Lavera Guise Internal medicine

## 2018-01-03 NOTE — ED Notes (Signed)

## 2018-01-03 NOTE — Discharge Instructions (Addendum)
You were seen today in the Emergency Department (ED) for swelling in your legs.  As we discussed, your workup today was reassuring.  This condition is known as "peripheral edema", and it is not dangerous although it can be uncomfortable.  Fortunately at this time it appears that you have no emergent medical condition at this time and that you are safe to go home and follow up as recommended in this paperwork.  Lab work, ultrasounds, infectious disease testing, urinalysis, and chest x-ray all failed to identify an acute or emergent medical condition.  Blood cultures are pending, but you have no sign of cellulitis at this time.  We recommend that you keep your legs elevated as much as possible, particularly when you are sitting down or even lying down at night (use a pillow or two under your lower legs).  Consider buying compression stockings at the pharmacy or using Ace (elastic) wraps on your legs they can provide firm but not tight pressure to help reduce the swelling.  Please continue taking your regular medications unless told otherwise in these documents.  Please take any new medications that may have been prescribed during this visit.  Follow up with the providers listed above.  Please return immediately to the Emergency Department if you develop any new or worsening symptoms that concern you.

## 2018-01-03 NOTE — ED Notes (Signed)
Pts PCP called and requested UDS to be done.

## 2018-01-03 NOTE — ED Notes (Signed)
Pt in ultrasound

## 2018-01-03 NOTE — ED Triage Notes (Signed)
Pt is here with daughter from PCP with c/o increased BL LE edema, pt c/o bodyaches with abd cramping that started today. They were concerned about a "blood infection".[name]

## 2018-01-03 NOTE — ED Provider Notes (Signed)
[facility] Emergency Department Provider Note  ____________________________________________   First MD Initiated Contact with Patient 01/03/18 1627     (approximate)  I have reviewed the triage vital signs and the nursing notes.   HISTORY  Chief Complaint Generalized Body Aches and Leg Swelling  History is limited from the patient herself due to her age, hard of hearing, and probable age-related dementia.  History is provided primarily by her daughter who is at bedside and her legal guardian.  HPI [name] is a 84 y.o. female with extensive chronic medical history who presents with her daughter for evaluation of swelling in her lower extremities.  The patient saw her primary care provider, Dr. Clayborn Bigness, earlier this afternoon for evaluation of body aches and lower extremity edema with abdominal cramping that started today and is severe.  According to the patient's daughter, the primary care provider looked at her legs and told her she should go to the emergency department because she may have "a blood infection".  The patient reportedly has been on Keflex relatively recently for a urinary tract infection but it is unclear she is currently taking Keflex.  The patient is very hard of hearing but denies chest pain, shortness of breath, and abdominal pain at least to me.  She is reporting pain across the back of her shoulders (in the setting of extensive chronic back issues and prior surgeries) and she reports that both of her lower legs hurt and have been swollen.  Her daughter reports that she has chronic lower extremity edema although it does seem like it has been worse recently.  The legs are a little bit red and they are tender to the touch.  All of the symptoms are severe and nothing in particular is making them better or worse.  Past Medical History:  Diagnosis Date  . Allergy    environmental  . Anxiety   . Chronic back pain   . High cholesterol     . History of urinary retention   . Hypertension   . Migraines   . Osteoporosis   . Pneumonia   . Sinusitis     Patient Active Problem List   Diagnosis Date Noted  . Arm fracture, left 01/03/2018  . Arthritis 01/03/2018  . Compression fracture of thoracic vertebra ([place]) 01/03/2018  . Emphysema lung ([place]) 01/03/2018  . GERD (gastroesophageal reflux disease) 01/03/2018  . GI bleeding 01/03/2018  . Lumbar spondylosis 01/03/2018  . Menstrual problem 01/03/2018  . Osteoarthritis of knee 01/03/2018  . Osteoporosis 01/03/2018  . Other specified joint disorders, right knee 01/03/2018  . Ankle sprain 01/03/2018  . Synovial cyst of popliteal space 01/03/2018  . History of urinary retention 11/10/2017  . Generalized anxiety disorder 10/24/2017  . Constipation 10/24/2017  . Internal hemorrhoids 10/24/2017  . Essential (primary) hypertension 10/24/2017  . Thrombocytopenia ([place]) 10/24/2017  . Acute encephalopathy 08/04/2017  . Hyponatremia 12/15/2015    Past Surgical History:  Procedure Laterality Date  . ABDOMINAL HYSTERECTOMY    . APPENDECTOMY    . BACK SURGERY    . BREAST BIOPSY Right 2002   neg  . HEMORRHOID SURGERY    . TONSILLECTOMY      Prior to Admission medications   Medication Sig Start Date End Date Taking? Authorizing Provider  amLODipine (NORVASC) 2.5 MG tablet Take 1 tablet (2.5 mg total) by mouth daily. 12/26/17   Ronnell Freshwater, NP  calcium carbonate (CALCIUM 600) 600 MG TABS tablet Take by  mouth.    [provider]  carvedilol (COREG) 6.25 MG tablet Take 6.25 mg by mouth 2 (two) times daily with a meal.    [provider]  cephALEXin (KEFLEX) 500 MG capsule take 1 cap po tid for 7 days 12/20/17   Ronnell Freshwater, NP  furosemide (LASIX) 20 MG tablet Take 1 tablet po QD prn 11/29/17   Ronnell Freshwater, NP  HYDROcodone-acetaminophen (NORCO) 10-325 MG tablet Take 1 tablet by mouth 3 (three) times daily.     [provider]  LORazepam  (ATIVAN) 0.5 MG tablet Take 1 tablet (0.5 mg total) by mouth 3 (three) times daily as needed for anxiety. 11/29/17   Ronnell Freshwater, NP  meloxicam (MOBIC) 7.5 MG tablet Take 7.5 mg by mouth daily as needed for pain.     [provider]  mirtazapine (REMERON) 30 MG tablet Take 1 tablet (30 mg total) by mouth at bedtime. 11/29/17   Ronnell Freshwater, NP  tamsulosin (FLOMAX) 0.4 MG CAPS capsule Take 1 capsule (0.4 mg total) by mouth daily. 11/17/17   Zara Council A, PA-C    Allergies Ceftin [cefuroxime]; Cortisone; Fluticasone; Levofloxacin; Meperidine; Morphine and related; Morphine sulfate; Nisoldipine; Penicillin g potassium in d5w; Prednisone; and Sulfa antibiotics  Family History  Problem Relation Age of Onset  . Breast cancer Sister 27  . Breast cancer Maternal Grandmother 85  . Breast cancer Sister 80  . Bladder Cancer Brother   . Hypertension Neg Hx   . Kidney cancer Neg Hx     Social History Social History   Tobacco Use  . Smoking status: Never Smoker  . Smokeless tobacco: Never Used  Substance Use Topics  . Alcohol use: No  . Drug use: No    Review of Systems History is limited from the patient herself due to her age, hard of hearing, and probable age-related dementia.  History is provided primarily by her daughter who is at bedside and her legal guardian.  Constitutional: No fever/chills.  Generalized body aches and general malaise. Eyes: No visual changes. ENT: No sore throat.  recent nasal congestion  Cardiovascular: Denies chest pain. Respiratory: Denies shortness of breath.  Recent "rattling" cough Gastrointestinal: No abdominal pain.  No nausea, no vomiting.  No diarrhea.  No constipation. Genitourinary: Negative for dysuria. Musculoskeletal: Pain across the back of her shoulders and in bilateral lower extremities with pitting edema, acute on chronic. Integumentary: Negative for rash. Neurological: Negative for headaches, focal weakness or  numbness.   ____________________________________________   PHYSICAL EXAM:  VITAL SIGNS: ED Triage Vitals  Enc Vitals Group     BP 01/03/18 1315 (!) 118/91     Pulse Rate 01/03/18 1315 (!) 114     Resp 01/03/18 1315 20     Temp 01/03/18 1315 97.8 F (36.6 C)     Temp src --      SpO2 01/03/18 1315 95 %     Weight 01/03/18 1315 56.2 kg (124 lb)     Height 01/03/18 1315 1.524 m (5')     Head Circumference --      Peak Flow --      Pain Score 01/03/18 1341 10     Pain Loc --      Pain Edu? --      Excl. in [place]? --     Constitutional: Alert and oriented to person and place.  Severely hard of hearing. No acute distress currently. Eyes: Conjunctivae are normal.  Head: Atraumatic. Nose:  Very mild congestion/rhinnorhea. Mouth/Throat: Mucous membranes are somewhat dry. Neck: No stridor.  No meningeal signs.   Cardiovascular: Mild tachycardia, regular rhythm. Good peripheral circulation. Grossly normal heart sounds. Respiratory: Normal respiratory effort.  No retractions. Lungs CTAB. Gastrointestinal: Soft and nontender. No distention.  Musculoskeletal: Bilateral lower extremity pitting edema with some very mild erythema but no evidence of cellulitis.  No open or purulent wounds.  Extremities are warm to the touch.  No concern for arterial deficit. Neurologic:  Normal speech and language. No gross focal neurologic deficits are appreciated.  Skin:  Skin is warm, dry and intact. No rash noted except for the mild erythema as described in her bilateral lower extremities Psychiatric: Mood and affect are normal. Speech and behavior are normal.  ____________________________________________   LABS (all labs ordered are listed, but only abnormal results are displayed)  Labs Reviewed  COMPREHENSIVE METABOLIC PANEL - Abnormal; Notable for the following components:      Result Value   Sodium 133 (*)    Chloride 99 (*)    Glucose, Bld 106 (*)    All other components within normal limits   URINALYSIS, COMPLETE (UACMP) WITH MICROSCOPIC - Abnormal; Notable for the following components:   Color, Urine YELLOW (*)    APPearance HAZY (*)    Hgb urine dipstick SMALL (*)    All other components within normal limits  URINE DRUG SCREEN, QUALITATIVE ([facility] ONLY) - Abnormal; Notable for the following components:   Opiate, Ur Screen POSITIVE (*)    Benzodiazepine, Ur Scrn POSITIVE (*)    All other components within normal limits  BRAIN NATRIURETIC PEPTIDE - Abnormal; Notable for the following components:   B Natriuretic Peptide 111.0 (*)    All other components within normal limits  CULTURE, BLOOD (ROUTINE X 2)  CULTURE, BLOOD (ROUTINE X 2)  LIPASE, BLOOD  CBC  LACTIC ACID, PLASMA  INFLUENZA PANEL BY PCR (TYPE A & B)   ____________________________________________  EKG  None - EKG not ordered by ED physician ____________________________________________  RADIOLOGY Ursula Alert, personally viewed and evaluated these images (plain radiographs) as part of my medical decision making, as well as reviewing the written report by the radiologist.  ED MD interpretation: No acute infiltrates which would suggest pneumonia.  No DVT on ultrasounds   Official radiology report(s): US Venous Img Lower Bilateral  Result Date: 01/03/2018 CLINICAL DATA:  Pain and swelling.  Rule out DVT. EXAM: BILATERAL LOWER EXTREMITY VENOUS DOPPLER ULTRASOUND TECHNIQUE: Gray-scale sonography with graded compression, as well as color Doppler and duplex ultrasound were performed to evaluate the lower extremity deep venous systems from the level of the common femoral vein and including the common femoral, femoral, profunda femoral, popliteal and calf veins including the posterior tibial, peroneal and gastrocnemius veins when visible. The superficial great saphenous vein was also interrogated. Spectral Doppler was utilized to evaluate flow at rest and with distal augmentation maneuvers in the common femoral,  femoral and popliteal veins. COMPARISON:  Left lower extremity venous ultrasound 03/18/2016 FINDINGS: RIGHT LOWER EXTREMITY Common Femoral Vein: No evidence of thrombus. Normal compressibility, respiratory phasicity and response to augmentation. Saphenofemoral Junction: No evidence of thrombus. Normal compressibility and flow on color Doppler imaging. Profunda Femoral Vein: No evidence of thrombus. Normal compressibility and flow on color Doppler imaging. Femoral Vein: No evidence of thrombus. Normal compressibility, respiratory phasicity and response to augmentation. Popliteal Vein: No evidence of thrombus. Normal compressibility, respiratory phasicity and response to augmentation. Calf Veins: No evidence of thrombus. Normal compressibility  and flow on color Doppler imaging. Superficial Great Saphenous Vein: No evidence of thrombus. Normal compressibility. Venous Reflux:  None. Other Findings: Large Baker's cyst on the right with complex fluid, measuring 8.5 x 2.0 x 3.3 cm. LEFT LOWER EXTREMITY Common Femoral Vein: No evidence of thrombus. Normal compressibility, respiratory phasicity and response to augmentation. Saphenofemoral Junction: No evidence of thrombus. Normal compressibility and flow on color Doppler imaging. Profunda Femoral Vein: No evidence of thrombus. Normal compressibility and flow on color Doppler imaging. Femoral Vein: No evidence of thrombus. Normal compressibility, respiratory phasicity and response to augmentation. Popliteal Vein: No evidence of thrombus. Normal compressibility, respiratory phasicity and response to augmentation. Calf Veins: No evidence of thrombus. Normal compressibility and flow on color Doppler imaging. Superficial Great Saphenous Vein: No evidence of thrombus. Normal compressibility. Venous Reflux:  None. Other Findings: Large Baker's cyst on the left with complex fluid, measuring 6.2 x 2.2 x 5.3 cm. IMPRESSION: No evidence of deep venous thrombosis. Bilateral Baker's  cysts. Electronically Signed   By: Franchot Gallo M.D.   On: 01/03/2018 18:11   Dg Chest Portable 1 View  Result Date: 01/03/2018 CLINICAL DATA:  Tachycardia.  Pain and weakness EXAM: PORTABLE CHEST 1 VIEW COMPARISON:  04/15/2017 FINDINGS: Cardiac enlargement without heart failure. Tortuous aorta. Negative for infiltrate effusion or mass. Mild scarring in the left lung base unchanged. IMPRESSION: No active disease. Electronically Signed   By: Franchot Gallo M.D.   On: 01/03/2018 16:56    ____________________________________________   PROCEDURES  Critical Care performed: No   Procedure(s) performed:   Procedures   ____________________________________________   INITIAL IMPRESSION / ASSESSMENT AND PLAN / ED COURSE  As part of my medical decision making, I reviewed the following data within the [place] History obtained from family, Nursing notes reviewed and incorporated, Labs reviewed , Old chart reviewed, Radiograph reviewed , Notes from prior ED visits and [place] Controlled Substance Database    Differential diagnosis includes, but is not limited to, chronic venous stasis with acute on chronic peripheral edema, DVT, cellulitis of lower extremities, bacteremia, pneumonia, viral illness.  The patient is mildly tachycardic but otherwise her vital signs are stable.  Lab work obtained in triage indicates no indication of infection in the urine and a normal conference of metabolic panel.  She also has a normal CBC with no leukocytosis.  I have added on a BNP and a lactic acid.  Given the concern expressed by her primary care provider I have added on blood cultures but understand that those results will not come back today.  Her chest radiograph also does not show any sign of pneumonia.  Given her worsening lower extremity edema I have also ordered bilateral lower extremity ultrasounds, but explained to the daughter that at this point I am not seeing any acute or emergent medical  condition that would require admission to the hospital.  The daughter understands and agrees with this plan and does not want her admitted if there is not an acute issue to treat.    Her urine drug screen was notable for being positive for both opiates and benzodiazepines, but she has active prescriptions for both of those medications according to the [place] controlled substance database and these appear to be chronic prescriptions that she has had for an extended period of time.   Clinical Course as of Jan 04 1915  Tue Jan 03, 2018  1717 reassuring BNP  B Natriuretic Peptide(!): 111.0 [CF]  1911 Patient's workup has been  reassuring and nonacute.  Influenza is negative, lactic acid is normal.  I spoke with both of the patient's caregiver daughters and they are both comfortable with plan for discharge and outpatient follow-up.  No indication for antibiotics at this time.   [CF]    Clinical Course User Index [CF] Hinda Kehr, MD    ____________________________________________  FINAL CLINICAL IMPRESSION(S) / ED DIAGNOSES  Final diagnoses:  Bilateral lower extremity edema     MEDICATIONS GIVEN DURING THIS VISIT:  Medications - No data to display   ED Discharge Orders    None       Note:  This document was prepared using Dragon voice recognition software and may include unintentional dictation errors.    Hinda Kehr, MD 01/03/18 [id number]

## 2018-01-06 ENCOUNTER — Other Ambulatory Visit: Payer: Self-pay | Admitting: Internal Medicine

## 2018-01-06 MED ORDER — CARVEDILOL 6.25 MG PO TABS: 6.2500 mg | ORAL_TABLET | Freq: Two times a day (BID) | ORAL | 2 refills | Status: DC

## 2018-01-08 LAB — CULTURE, BLOOD (ROUTINE X 2)
CULTURE: NO GROWTH
Culture: NO GROWTH
Special Requests: ADEQUATE
Special Requests: ADEQUATE

## 2018-01-13 ENCOUNTER — Other Ambulatory Visit: Payer: Self-pay

## 2018-01-13 MED ORDER — CEPHALEXIN 500 MG PO CAPS: ORAL_CAPSULE | ORAL | 0 refills | Status: DC

## 2018-01-13 NOTE — Telephone Encounter (Signed)
Pt called that had uti as per heather send cephalexin for 7 days

## 2018-01-24 ENCOUNTER — Other Ambulatory Visit: Payer: Self-pay | Admitting: Nurse Practitioner

## 2018-01-24 ENCOUNTER — Telehealth: Payer: Self-pay

## 2018-01-24 DIAGNOSIS — N390 Urinary tract infection, site not specified: Secondary | ICD-10-CM

## 2018-01-24 MED ORDER — CEPHALEXIN 500 MG PO CAPS: ORAL_CAPSULE | ORAL | 0 refills | Status: DC

## 2018-01-24 NOTE — Telephone Encounter (Signed)
Pt advised we send antibiotic for uri and uti and if she keep having uti for urologist

## 2018-01-24 NOTE — Telephone Encounter (Signed)
Patient called c/o uri and uti. New rx for cephalexin sent to [facility]. She should take this 3 times daily for 7 days.

## 2018-01-24 NOTE — Progress Notes (Signed)
Patient called c/o uri and uti. New rx for cephalexin sent to [facility]. She should take this 3 times daily for 7 days.

## 2018-01-30 ENCOUNTER — Telehealth: Payer: Self-pay

## 2018-01-30 DIAGNOSIS — F411 Generalized anxiety disorder: Secondary | ICD-10-CM

## 2018-01-30 MED ORDER — LORAZEPAM 0.5 MG PO TABS: 0.5000 mg | ORAL_TABLET | Freq: Three times a day (TID) | ORAL | 2 refills | Status: DC | PRN

## 2018-01-30 NOTE — Telephone Encounter (Signed)
Sent in new prescription for lorazepam 0.5mg , TID as needed. #90 with 2 refills to [facility].

## 2018-02-02 ENCOUNTER — Emergency Department
Admission: EM | Admit: 2018-02-02 | Discharge: 2018-02-02 | Disposition: A | Discharge status: Home / Self Care | Payer: Medicare Other | Attending: Emergency Medicine | Admitting: Emergency Medicine

## 2018-02-02 ENCOUNTER — Ambulatory Visit: Payer: Medicare Other | Admitting: Urology

## 2018-02-02 ENCOUNTER — Encounter: Payer: Self-pay | Admitting: Emergency Medicine

## 2018-02-02 ENCOUNTER — Other Ambulatory Visit: Payer: Self-pay

## 2018-02-02 DIAGNOSIS — R339 Retention of urine, unspecified: Secondary | ICD-10-CM | POA: Diagnosis not present

## 2018-02-02 DIAGNOSIS — I10 Essential (primary) hypertension: Secondary | ICD-10-CM | POA: Diagnosis not present

## 2018-02-02 DIAGNOSIS — Z79899 Other long term (current) drug therapy: Secondary | ICD-10-CM | POA: Diagnosis not present

## 2018-02-02 DIAGNOSIS — R300 Dysuria: Secondary | ICD-10-CM | POA: Diagnosis not present

## 2018-02-02 DIAGNOSIS — M6281 Muscle weakness (generalized): Secondary | ICD-10-CM | POA: Diagnosis not present

## 2018-02-02 LAB — URINALYSIS, COMPLETE (UACMP) WITH MICROSCOPIC
BACTERIA UA: NONE SEEN
Bilirubin Urine: NEGATIVE
Glucose, UA: NEGATIVE mg/dL
KETONES UR: NEGATIVE mg/dL
Leukocytes, UA: NEGATIVE
Nitrite: NEGATIVE
PH: 8 (ref 5.0–8.0)
Protein, ur: NEGATIVE mg/dL
SPECIFIC GRAVITY, URINE: 1.003 — AB (ref 1.005–1.030)

## 2018-02-02 LAB — BASIC METABOLIC PANEL
ANION GAP: 9 (ref 5–15)
BUN: 11 mg/dL (ref 6–20)
CHLORIDE: 91 mmol/L — AB (ref 101–111)
CO2: 26 mmol/L (ref 22–32)
Calcium: 9.6 mg/dL (ref 8.9–10.3)
Creatinine, Ser: 0.49 mg/dL (ref 0.44–1.00)
GFR calc non Af Amer: >60 mL/min (ref >60)
Glucose, Bld: 114 mg/dL — ABNORMAL HIGH (ref 65–99)
Potassium: 3.8 mmol/L (ref 3.5–5.1)
Sodium: 126 mmol/L — ABNORMAL LOW (ref 135–145)

## 2018-02-02 LAB — CBC WITH DIFFERENTIAL/PLATELET
Basophils Absolute: 0 10*3/uL (ref 0–0.1)
Basophils Relative: 1 %
Eosinophils Absolute: 0.1 10*3/uL (ref 0–0.7)
Eosinophils Relative: 1 %
HEMATOCRIT: 38.5 % (ref 35.0–47.0)
HEMOGLOBIN: 13.2 g/dL (ref 12.0–16.0)
LYMPHS ABS: 1.9 10*3/uL (ref 1.0–3.6)
LYMPHS PCT: 29 %
MCH: 30.4 pg (ref 26.0–34.0)
MCHC: 34.4 g/dL (ref 32.0–36.0)
MCV: 88.4 fL (ref 80.0–100.0)
MONOS PCT: 9 %
Monocytes Absolute: 0.6 10*3/uL (ref 0.2–0.9)
NEUTROS ABS: 4 10*3/uL (ref 1.4–6.5)
NEUTROS PCT: 60 %
Platelets: 222 10*3/uL (ref 150–440)
RBC: 4.35 MIL/uL (ref 3.80–5.20)
RDW: 12.8 % (ref 11.5–14.5)
WBC: 6.5 10*3/uL (ref 3.6–11.0)

## 2018-02-02 MED ORDER — HYDROCODONE-ACETAMINOPHEN 5-325 MG PO TABS
2.0000 | ORAL_TABLET | Freq: Once | ORAL | Status: AC
  Administered 2018-02-02: 2 via ORAL
  Filled 2018-02-02: qty 2

## 2018-02-02 MED ORDER — SODIUM CHLORIDE 0.9 % IV BOLUS
1000.0000 mL | Freq: Once | INTRAVENOUS | Status: AC
  Administered 2018-02-02: 1000 mL via INTRAVENOUS

## 2018-02-02 NOTE — ED Notes (Signed)
Applied leg bag per pt and family request and spoke with them about how it is emptied.  Helped pt get dressed

## 2018-02-02 NOTE — Discharge Instructions (Addendum)
Please seek medical attention for any high fevers, chest pain, shortness of breath, change in behavior, persistent vomiting, bloody stool or any other new or concerning symptoms.  

## 2018-02-02 NOTE — ED Triage Notes (Signed)
Pt in via [facility] from home with complaints of increasing generalized weakness, being treated for UTI x 2 weeks, on cephalexin.  Pt reports appointment with urology today but she felt too bad to get there.  Pt reports dysuria continues.  EDP to bedside.

## 2018-02-02 NOTE — ED Provider Notes (Signed)
[facility] Emergency Department Provider Note  ____________________________________________   I have reviewed the triage vital signs and the nursing notes.   HISTORY  Chief Complaint Weakness and Recurrent UTI   History limited by: Not Limited   HPI [name] is a 84 y.o. female who presents to the emergency department today because of concerns for urinary tract infection. The patient states she has been having symptoms for roughly 2 weeks and has been taking oral antibiotics. She describes pain with urination and lower abdominal pain. She denies any vomiting or fevers. Has had generalized weakness. The patient had an appointment with urology scheduled for this morning but cancelled it. She states that she has had recurrent UTIs in the past.    Per medical record review patient has a history of urinary retention.  Past Medical History:  Diagnosis Date  . Allergy    environmental  . Anxiety   . Chronic back pain   . High cholesterol   . History of urinary retention   . Hypertension   . Migraines   . Osteoporosis   . Pneumonia   . Sinusitis     Patient Active Problem List   Diagnosis Date Noted  . Arm fracture, left 01/03/2018  . Arthritis 01/03/2018  . Compression fracture of thoracic vertebra ([place]) 01/03/2018  . Emphysema lung ([place]) 01/03/2018  . GERD (gastroesophageal reflux disease) 01/03/2018  . GI bleeding 01/03/2018  . Lumbar spondylosis 01/03/2018  . Menstrual problem 01/03/2018  . Osteoarthritis of knee 01/03/2018  . Osteoporosis 01/03/2018  . Other specified joint disorders, right knee 01/03/2018  . Ankle sprain 01/03/2018  . Synovial cyst of popliteal space 01/03/2018  . History of urinary retention 11/10/2017  . Generalized anxiety disorder 10/24/2017  . Constipation 10/24/2017  . Internal hemorrhoids 10/24/2017  . Essential (primary) hypertension 10/24/2017  . Thrombocytopenia ([place]) 10/24/2017  . Acute encephalopathy  08/04/2017  . Hyponatremia 12/15/2015    Past Surgical History:  Procedure Laterality Date  . ABDOMINAL HYSTERECTOMY    . APPENDECTOMY    . BACK SURGERY    . BREAST BIOPSY Right 2002   neg  . HEMORRHOID SURGERY    . TONSILLECTOMY      Prior to Admission medications   Medication Sig Start Date End Date Taking? Authorizing Provider  amLODipine (NORVASC) 2.5 MG tablet Take 1 tablet (2.5 mg total) by mouth daily. 12/26/17   Ronnell Freshwater, NP  calcium carbonate (CALCIUM 600) 600 MG TABS tablet Take by mouth.    [provider]  carvedilol (COREG) 6.25 MG tablet Take 1 tablet (6.25 mg total) by mouth 2 (two) times daily with a meal. 01/06/18   Lavera Guise, MD  cephALEXin (KEFLEX) 500 MG capsule take 1 cap po tid for 7 days 01/24/18   Ronnell Freshwater, NP  furosemide (LASIX) 20 MG tablet Take 1 tablet po QD prn 11/29/17   Ronnell Freshwater, NP  HYDROcodone-acetaminophen (NORCO) 10-325 MG tablet Take 1 tablet by mouth 3 (three) times daily.     [provider]  LORazepam (ATIVAN) 0.5 MG tablet Take 1 tablet (0.5 mg total) by mouth 3 (three) times daily as needed for anxiety. 01/30/18   Ronnell Freshwater, NP  meloxicam (MOBIC) 7.5 MG tablet Take 7.5 mg by mouth daily as needed for pain.     [provider]  mirtazapine (REMERON) 30 MG tablet Take 1 tablet (30 mg total) by mouth at bedtime. 11/29/17   Ronnell Freshwater,  NP  tamsulosin (FLOMAX) 0.4 MG CAPS capsule Take 1 capsule (0.4 mg total) by mouth daily. 11/17/17   Zara Council A, PA-C    Allergies Ceftin [cefuroxime]; Cortisone; Fluticasone; Levofloxacin; Meperidine; Morphine and related; Morphine sulfate; Nisoldipine; Penicillin g potassium in d5w; Prednisone; and Sulfa antibiotics  Family History  Problem Relation Age of Onset  . Breast cancer Sister 47  . Breast cancer Maternal Grandmother 26  . Breast cancer Sister 61  . Bladder Cancer Brother   . Hypertension Neg Hx   . Kidney cancer Neg Hx      Social History Social History   Tobacco Use  . Smoking status: Never Smoker  . Smokeless tobacco: Never Used  Substance Use Topics  . Alcohol use: No  . Drug use: No    Review of Systems Constitutional: No fever/chills Eyes: No visual changes. ENT: No sore throat. Cardiovascular: Denies chest pain. Respiratory: Denies shortness of breath. Gastrointestinal: Positive for lower abdominal pain. Genitourinary: Positive for dysuria Musculoskeletal: Negative for back pain. Skin: Negative for rash. Neurological: Negative for headaches, focal weakness or numbness.  ____________________________________________   PHYSICAL EXAM:  VITAL SIGNS: ED Triage Vitals [02/02/18 1110]  Enc Vitals Group     BP 149/104     Pulse 88     Resp 20     Temp 98.2     Temp src      SpO2 95     Weight 123 lb (55.8 kg)     Height 5' (1.524 m)   Constitutional: Alert and oriented. Well appearing and in no distress. Eyes: Conjunctivae are normal.  ENT   Head: Normocephalic and atraumatic.   Nose: No congestion/rhinnorhea.   Mouth/Throat: Mucous membranes are moist.   Neck: No stridor. Hematological/Lymphatic/Immunilogical: No cervical lymphadenopathy. Cardiovascular: Normal rate, regular rhythm.  No murmurs, rubs, or gallops.  Respiratory: Normal respiratory effort without tachypnea nor retractions. Breath sounds are clear and equal bilaterally. No wheezes/rales/rhonchi. Gastrointestinal: Soft and minimally tender in the lower abdomen. No rebound. No guarding.  Genitourinary: Deferred Musculoskeletal: Normal range of motion in all extremities. No lower extremity edema. Neurologic:  Normal speech and language. No gross focal neurologic deficits are appreciated.  Skin:  Skin is warm, dry and intact. No rash noted. Psychiatric: Mood and affect are normal. Speech and behavior are normal. Patient exhibits appropriate insight and  judgment.  ____________________________________________    LABS (pertinent positives/negatives)  CBC wnl BMP na 126, k 3.8, glu 114, cr 0.49 UA wnl  ____________________________________________   EKG  None  ____________________________________________    RADIOLOGY  None  ____________________________________________   PROCEDURES  Procedures  ____________________________________________   INITIAL IMPRESSION / ASSESSMENT AND PLAN / ED COURSE  Pertinent labs & imaging results that were available during my care of the patient were reviewed by me and considered in my medical decision making (see chart for details).  Presented to the emergency department today because of concerns for dysuria and lower abdominal pain.  On exam patient did have minimal tenderness lower abdomen.  Differential includes urinary tract infection, urinary retention, diverticulitis, colitis amongst other etiologies.  Patient was found to have over a liter on bladder scan.  Foley catheter was placed with large amount of urine output.  Patient did feel better afterwards.  And without any obvious signs of infection.  Will send for urine culture.  Will discharge patient to follow-up with urology.  Discussed findings plan with patient.  ____________________________________________   FINAL CLINICAL IMPRESSION(S) / ED DIAGNOSES  Final diagnoses:  Urinary retention     Note: This dictation was prepared with Dragon dictation. Any transcriptional errors that result from this process are unintentional     Nance Pear, MD 02/02/18 [phone]

## 2018-02-03 LAB — URINE CULTURE: Culture: NO GROWTH

## 2018-02-09 ENCOUNTER — Ambulatory Visit (INDEPENDENT_AMBULATORY_CARE_PROVIDER_SITE_OTHER): Payer: Medicare Other

## 2018-02-09 VITALS — BP 120/80 | HR 94 | Resp 16 | Ht 60.0 in | Wt 124.0 lb

## 2018-02-09 DIAGNOSIS — R339 Retention of urine, unspecified: Secondary | ICD-10-CM

## 2018-02-09 LAB — URINALYSIS, COMPLETE
Bilirubin, UA: NEGATIVE
Glucose, UA: NEGATIVE
Ketones, UA: NEGATIVE
Nitrite, UA: NEGATIVE
PROTEIN UA: NEGATIVE
Specific Gravity, UA: 1.01 (ref 1.005–1.030)
Urobilinogen, Ur: 0.2 mg/dL (ref 0.2–1.0)
pH, UA: 7 (ref 5.0–7.5)

## 2018-02-09 LAB — MICROSCOPIC EXAMINATION: RBC, UA: NONE SEEN /hpf (ref 0–2)

## 2018-02-09 NOTE — Progress Notes (Signed)
Pt present this afternoon for PVR. Pt states she has been unable to urinate since foley cath was removed in AM. Pt provided 15cc of urine, pvr measured 329ml of urine. Pt c/o pain in her abdomin/vagina, as well as in her back. Analysis was ran showing no signs of infection.

## 2018-02-09 NOTE — Progress Notes (Signed)
Catheter Removal  Patient is present today for a catheter removal.  12ml of water was drained from the balloon. A 14FR foley cath was removed from the bladder no complications were noted . Patient tolerated well.  Preformed by: Fonnie Jarvis, CMA and Orma Flaming, CMA  Follow up/ Additional notes: return for PM PVR    Simple Catheter Placement  Due to urinary retention patient is present today for a foley cath placement.  Patient was cleaned and prepped in a sterile fashion with betadine and lidocaine jelly 2% was instilled into the urethra.  A 16 FR foley catheter was inserted, urine return was noted  353ml, urine was clear yellow in color.  The balloon was filled with 10cc of sterile water.  A leg bag was attached for drainage. Patient was also given a night bag to take home and was given instruction on how to change from one bag to another.  Patient was given instruction on proper catheter care.  Patient tolerated well, no complications were noted   Preformed by: Orma Flaming, CMA and Fonnie Jarvis, CMA  Additional notes/ Follow up: 1 month to see a provider to discuss management

## 2018-02-14 ENCOUNTER — Ambulatory Visit (INDEPENDENT_AMBULATORY_CARE_PROVIDER_SITE_OTHER): Payer: Medicare Other | Admitting: Nurse Practitioner

## 2018-02-14 ENCOUNTER — Encounter: Payer: Self-pay | Admitting: Emergency Medicine

## 2018-02-14 ENCOUNTER — Emergency Department
Admission: EM | Admit: 2018-02-14 | Discharge: 2018-02-14 | Disposition: A | Discharge status: Home / Self Care | Payer: Medicare Other | Attending: Student in an Organized Health Care Education/Training Program | Admitting: Student in an Organized Health Care Education/Training Program

## 2018-02-14 ENCOUNTER — Other Ambulatory Visit: Payer: Self-pay

## 2018-02-14 ENCOUNTER — Encounter: Payer: Self-pay | Admitting: Nurse Practitioner

## 2018-02-14 ENCOUNTER — Emergency Department: Payer: Medicare Other

## 2018-02-14 VITALS — BP 128/87 | HR 105 | Resp 16 | Ht 60.0 in | Wt 120.6 lb

## 2018-02-14 DIAGNOSIS — I10 Essential (primary) hypertension: Secondary | ICD-10-CM

## 2018-02-14 DIAGNOSIS — N939 Abnormal uterine and vaginal bleeding, unspecified: Secondary | ICD-10-CM | POA: Diagnosis not present

## 2018-02-14 DIAGNOSIS — Z79899 Other long term (current) drug therapy: Secondary | ICD-10-CM | POA: Diagnosis not present

## 2018-02-14 DIAGNOSIS — R0602 Shortness of breath: Secondary | ICD-10-CM | POA: Diagnosis not present

## 2018-02-14 DIAGNOSIS — R531 Weakness: Secondary | ICD-10-CM

## 2018-02-14 DIAGNOSIS — R339 Retention of urine, unspecified: Secondary | ICD-10-CM | POA: Insufficient documentation

## 2018-02-14 DIAGNOSIS — R338 Other retention of urine: Secondary | ICD-10-CM

## 2018-02-14 DIAGNOSIS — R102 Pelvic and perineal pain: Secondary | ICD-10-CM | POA: Diagnosis not present

## 2018-02-14 DIAGNOSIS — R079 Chest pain, unspecified: Secondary | ICD-10-CM | POA: Diagnosis not present

## 2018-02-14 LAB — CBC
HCT: 40.2 % (ref 35.0–47.0)
Hemoglobin: 13.4 g/dL (ref 12.0–16.0)
MCH: 30 pg (ref 26.0–34.0)
MCHC: 33.4 g/dL (ref 32.0–36.0)
MCV: 90 fL (ref 80.0–100.0)
PLATELETS: 190 10*3/uL (ref 150–440)
RBC: 4.47 MIL/uL (ref 3.80–5.20)
RDW: 13.4 % (ref 11.5–14.5)
WBC: 7.1 10*3/uL (ref 3.6–11.0)

## 2018-02-14 LAB — BASIC METABOLIC PANEL
Anion gap: 9 (ref 5–15)
BUN: 14 mg/dL (ref 6–20)
CALCIUM: 10 mg/dL (ref 8.9–10.3)
CO2: 24 mmol/L (ref 22–32)
CREATININE: 0.56 mg/dL (ref 0.44–1.00)
Chloride: 99 mmol/L — ABNORMAL LOW (ref 101–111)
GFR calc non Af Amer: >60 mL/min (ref >60)
GLUCOSE: 96 mg/dL (ref 65–99)
Potassium: 3.8 mmol/L (ref 3.5–5.1)
Sodium: 132 mmol/L — ABNORMAL LOW (ref 135–145)

## 2018-02-14 LAB — URINALYSIS, COMPLETE (UACMP) WITH MICROSCOPIC
BILIRUBIN URINE: NEGATIVE
Glucose, UA: NEGATIVE mg/dL
KETONES UR: NEGATIVE mg/dL
NITRITE: NEGATIVE
PH: 8 (ref 5.0–8.0)
Protein, ur: NEGATIVE mg/dL
Specific Gravity, Urine: 1.004 — ABNORMAL LOW (ref 1.005–1.030)
Squamous Epithelial / LPF: NONE SEEN (ref 0–5)

## 2018-02-14 LAB — TROPONIN I

## 2018-02-14 NOTE — ED Provider Notes (Signed)
[facility] Emergency Department Provider Note    First MD Initiated Contact with Patient 02/14/18 1757     (approximate)  I have reviewed the triage vital signs and the nursing notes.   HISTORY  Chief Complaint Chest Pain    HPI [name] is a 84 y.o. female resents to the ER from her cardiology clinic with chief complaint of suprapubic pain and feeling that her Foley catheter is not draining.  Per family she has been having significant output from the Foley catheter.  Initially was at Dr. Sharol Roussel office today for chest pains.  Denies any fevers at home.  No nausea or vomiting.  Has not had any chest pain since this morning.  Denies any pain shooting or tearing through to her back.  States the pain in her suprapubic area is moderate to severe.  Past Medical History:  Diagnosis Date  . Allergy    environmental  . Anxiety   . Chronic back pain   . High cholesterol   . History of urinary retention   . Hypertension   . Migraines   . Osteoporosis   . Pneumonia   . Sinusitis    Family History  Problem Relation Age of Onset  . Breast cancer Sister 15  . Breast cancer Maternal Grandmother 72  . Breast cancer Sister 64  . Bladder Cancer Brother   . Hypertension Neg Hx   . Kidney cancer Neg Hx    Past Surgical History:  Procedure Laterality Date  . ABDOMINAL HYSTERECTOMY    . APPENDECTOMY    . BACK SURGERY    . BREAST BIOPSY Right 2002   neg  . HEMORRHOID SURGERY    . TONSILLECTOMY     Patient Active Problem List   Diagnosis Date Noted  . Arm fracture, left 01/03/2018  . Arthritis 01/03/2018  . Compression fracture of thoracic vertebra ([place]) 01/03/2018  . Emphysema lung ([place]) 01/03/2018  . GERD (gastroesophageal reflux disease) 01/03/2018  . GI bleeding 01/03/2018  . Lumbar spondylosis 01/03/2018  . Menstrual problem 01/03/2018  . Osteoarthritis of knee 01/03/2018  . Osteoporosis 01/03/2018  . Other specified joint disorders,  right knee 01/03/2018  . Ankle sprain 01/03/2018  . Synovial cyst of popliteal space 01/03/2018  . History of urinary retention 11/10/2017  . Generalized anxiety disorder 10/24/2017  . Constipation 10/24/2017  . Internal hemorrhoids 10/24/2017  . Essential (primary) hypertension 10/24/2017  . Thrombocytopenia ([place]) 10/24/2017  . Acute encephalopathy 08/04/2017  . Hyponatremia 12/15/2015      Prior to Admission medications   Medication Sig Start Date End Date Taking? Authorizing Provider  amLODipine (NORVASC) 2.5 MG tablet Take 1 tablet (2.5 mg total) by mouth daily. 12/26/17   Ronnell Freshwater, NP  calcium carbonate (CALCIUM 600) 600 MG TABS tablet Take by mouth.    [provider]  carvedilol (COREG) 6.25 MG tablet Take 1 tablet (6.25 mg total) by mouth 2 (two) times daily with a meal. 01/06/18   Lavera Guise, MD  cephALEXin (KEFLEX) 500 MG capsule take 1 cap po tid for 7 days 01/24/18   Ronnell Freshwater, NP  furosemide (LASIX) 20 MG tablet Take 1 tablet po QD prn 11/29/17   Ronnell Freshwater, NP  HYDROcodone-acetaminophen (NORCO) 10-325 MG tablet Take 1 tablet by mouth 3 (three) times daily.     [provider]  LORazepam (ATIVAN) 0.5 MG tablet Take 1 tablet (0.5 mg total) by mouth 3 (three) times daily as  needed for anxiety. 01/30/18   Ronnell Freshwater, NP  meloxicam (MOBIC) 7.5 MG tablet Take 7.5 mg by mouth daily as needed for pain.     [provider]  mirtazapine (REMERON) 30 MG tablet Take 1 tablet (30 mg total) by mouth at bedtime. 11/29/17   Ronnell Freshwater, NP  tamsulosin (FLOMAX) 0.4 MG CAPS capsule Take 1 capsule (0.4 mg total) by mouth daily. 11/17/17   Zara Council A, PA-C    Allergies Ceftin [cefuroxime]; Cortisone; Fluticasone; Levofloxacin; Meperidine; Morphine and related; Morphine sulfate; Nisoldipine; Penicillin g potassium in d5w; Prednisone; and Sulfa antibiotics    Social History Social History   Tobacco Use  . Smoking status:  Never Smoker  . Smokeless tobacco: Never Used  Substance Use Topics  . Alcohol use: No  . Drug use: No    Review of Systems Patient denies headaches, rhinorrhea, blurry vision, numbness, shortness of breath, chest pain, edema, cough, abdominal pain, nausea, vomiting, diarrhea, dysuria, fevers, rashes or hallucinations unless otherwise stated above in HPI. ____________________________________________   PHYSICAL EXAM:  VITAL SIGNS: Vitals:   02/14/18 2030 02/14/18 2100  BP: 117/83 130/83  Pulse: 96 98  Resp: 13 13  Temp:    SpO2: 93% 94%    Constitutional: Alert and oriented. Very uncomfortable appearing Eyes: Conjunctivae are normal.  Head: Atraumatic. Nose: No congestion/rhinnorhea. Mouth/Throat: Mucous membranes are moist.   Neck: No stridor. Painless ROM.  Cardiovascular: Normal rate, regular rhythm. Grossly normal heart sounds.  Good peripheral circulation. Respiratory: Normal respiratory effort.  No retractions. Lungs CTAB. Gastrointestinal: Soft and nontender. No distention. No abdominal bruits. No CVA tenderness. Genitourinary: normal external genitalia,  Musculoskeletal: No lower extremity tenderness nor edema.  No joint effusions. Neurologic:  Normal speech and language. No gross focal neurologic deficits are appreciated. No facial droop Skin:  Skin is warm, dry and intact. No rash noted. Psychiatric: Mood and affect are normal. Speech and behavior are normal.  ____________________________________________   LABS (all labs ordered are listed, but only abnormal results are displayed)  Results for orders placed or performed during the hospital encounter of 02/14/18 (from the past 24 hour(s))  Basic metabolic panel     Status: Abnormal   Collection Time: 02/14/18  5:55 PM  Result Value Ref Range   Sodium 132 (L) 135 - 145 mmol/L   Potassium 3.8 3.5 - 5.1 mmol/L   Chloride 99 (L) 101 - 111 mmol/L   CO2 24 22 - 32 mmol/L   Glucose, Bld 96 65 - 99 mg/dL   BUN 14  6 - 20 mg/dL   Creatinine, Ser 0.56 0.44 - 1.00 mg/dL   Calcium 10.0 8.9 - 10.3 mg/dL   GFR calc non Af Amer >60 >60 mL/min   GFR calc Af Amer >60 >60 mL/min   Anion gap 9 5 - 15  CBC     Status: None   Collection Time: 02/14/18  5:55 PM  Result Value Ref Range   WBC 7.1 3.6 - 11.0 K/uL   RBC 4.47 3.80 - 5.20 MIL/uL   Hemoglobin 13.4 12.0 - 16.0 g/dL   HCT 40.2 35.0 - 47.0 %   MCV 90.0 80.0 - 100.0 fL   MCH 30.0 26.0 - 34.0 pg   MCHC 33.4 32.0 - 36.0 g/dL   RDW 13.4 11.5 - 14.5 %   Platelets 190 150 - 440 K/uL  Troponin I     Status: None   Collection Time: 02/14/18  5:55 PM  Result Value  Ref Range   Troponin I <0.03 <0.03 ng/mL  Urinalysis, Complete w Microscopic     Status: Abnormal   Collection Time: 02/14/18  6:39 PM  Result Value Ref Range   Color, Urine STRAW (A) YELLOW   APPearance CLEAR (A) CLEAR   Specific Gravity, Urine 1.004 (L) 1.005 - 1.030   pH 8.0 5.0 - 8.0   Glucose, UA NEGATIVE NEGATIVE mg/dL   Hgb urine dipstick MODERATE (A) NEGATIVE   Bilirubin Urine NEGATIVE NEGATIVE   Ketones, ur NEGATIVE NEGATIVE mg/dL   Protein, ur NEGATIVE NEGATIVE mg/dL   Nitrite NEGATIVE NEGATIVE   Leukocytes, UA SMALL (A) NEGATIVE   RBC / HPF 0-5 0 - 5 RBC/hpf   WBC, UA 0-5 0 - 5 WBC/hpf   Bacteria, UA RARE (A) NONE SEEN   Squamous Epithelial / LPF NONE SEEN 0 - 5   ____________________________________________  EKG My review and personal interpretation at Time: 17:53   Indication: chest pain  Rate: 95  Rhythm: sinus Axis: normal Other: prolonged pr, no stemi, nonspecific st abn ____________________________________________  RADIOLOGY  I personally reviewed all radiographic images ordered to evaluate for the above acute complaints and reviewed radiology reports and findings.  These findings were personally discussed with the patient.  Please see medical record for radiology report.  ____________________________________________   PROCEDURES  Procedure(s) performed:    Procedures    Critical Care performed: no ____________________________________________   INITIAL IMPRESSION / ASSESSMENT AND PLAN / ED COURSE  Pertinent labs & imaging results that were available during my care of the patient were reviewed by me and considered in my medical decision making (see chart for details).  DDX: Bladder outlet obstruction, UTI, stone, hematuria, ACS  [name] is a 84 y.o. who presents to the ED with symptoms as described above.  Does not seem to endorse any chest pain at this time.  Does appear significantly uncomfortable and bedside ultrasound shows distended bladder with roughly 700 mL's despite having Foley catheter with no drainage.  Will exchange Foley catheter.  Based on her also report of chest pain this morning will check blood work as well as troponin.  Clinical Course as of Feb 15 2320  Tue Feb 14, 2018  6440 Patient without significant improvement in symptoms after placement of Foley catheter.   [PR]  2030 Urinalysis shows rare bacteria and is actually improved as compared to previous.  No significant dehydration at this time.  Urine culture from 5 days ago did not grow any bacteria.  Leukocytes likely inflammatory given presence of Foley catheter.  At this point she is otherwise significantly improved symptomatically I do believe she stable and appropriate for outpatient follow-up.   [PR]    Clinical Course User Index [PR] Merlyn Lot, MD     As part of my medical decision making, I reviewed the following data within the [facility] notes reviewed and incorporated, Labs reviewed, notes from prior ED visits and [place] Controlled Substance Database   ____________________________________________   FINAL CLINICAL IMPRESSION(S) / ED DIAGNOSES  Final diagnoses:  Acute urinary retention      NEW MEDICATIONS STARTED DURING THIS VISIT:  Discharge Medication List as of 02/14/2018  9:08 PM       Note:  This  document was prepared using Dragon voice recognition software and may include unintentional dictation errors.    Merlyn Lot, MD 02/14/18 [phone]

## 2018-02-14 NOTE — ED Notes (Signed)
Family member states that pt foley has been draining a lot. States that pt was not at cardiologist office today but was at PCP. States that PCP told her to back off on flomax d/t so much urine output. Pt c/o of bladder pain. Pt is not c/o of chest pain at this time.

## 2018-02-14 NOTE — ED Triage Notes (Signed)
Pt in via [facility] from cardiologist office, sent over due to chest pain.  Pt denies any chest pain at this time.  Pt complaints of pain to bladder, pt with urinary catheter in placed upon arrival, pt states, "I dont think its draining, its killing me."  NAD noted at this time.

## 2018-02-14 NOTE — ED Notes (Signed)
Pt taken to xray 

## 2018-02-16 ENCOUNTER — Other Ambulatory Visit: Payer: Self-pay

## 2018-02-16 DIAGNOSIS — R5383 Other fatigue: Secondary | ICD-10-CM | POA: Insufficient documentation

## 2018-02-16 DIAGNOSIS — R079 Chest pain, unspecified: Secondary | ICD-10-CM | POA: Insufficient documentation

## 2018-02-16 DIAGNOSIS — R0602 Shortness of breath: Secondary | ICD-10-CM | POA: Insufficient documentation

## 2018-02-16 DIAGNOSIS — I10 Essential (primary) hypertension: Secondary | ICD-10-CM

## 2018-02-16 LAB — URINE CULTURE: CULTURE: NO GROWTH

## 2018-02-16 MED ORDER — AMLODIPINE BESYLATE 2.5 MG PO TABS: 2.5000 mg | ORAL_TABLET | Freq: Every day | ORAL | 3 refills | Status: DC

## 2018-02-16 NOTE — Progress Notes (Signed)
[facility] [place], [place] [address]  Internal MEDICINE  Office Visit Note  Patient Name: [name]  [id number]  [id number]  Date of Service: 02/16/2018   Pt is here for routine follow up.    Chief Complaint  Patient presents with  . Chest Pain    fatigue ,weak, dehydrated     The patient is in the office with her daughter. Daughter states that chest pain started sometime yesterday. She was resting when symptoms started. The daughter also states that urology started patient on tamsulosisn to help with urinary flow, after patient was unable to urinate following a catheter placement and removal. Patient had catheter replaced on last Thursday. She remains on tamsulosin and 0n diuretic. Her daughter states that last night, the catheter bag started filling up so much she had to empty it close to every hour.   Chest Pain   This is a new problem. The current episode started yesterday. The onset quality is undetermined. The problem occurs constantly. The problem has been unchanged. The pain is present in the lateral region. The pain is at a severity of 5/10. The pain is moderate. The quality of the pain is described as heavy and tightness. The pain radiates to the left arm and left shoulder. Associated symptoms include abdominal pain, back pain, dizziness, headaches, nausea, palpitations, shortness of breath and weakness. Pertinent negatives include no cough, numbness or vomiting. The pain is aggravated by movement. She has tried nothing for the symptoms. Risk factors include being elderly.  Her past medical history is significant for PVD. Prior diagnostic workup includes echocardiogram and stress thallium.      Current Medication: Outpatient Encounter Medications as of 02/14/2018  Medication Sig Note  . amLODipine (NORVASC) 2.5 MG tablet Take 1 tablet (2.5 mg total) by mouth daily.   . calcium carbonate (CALCIUM 600) 600 MG TABS tablet Take by mouth.   .  carvedilol (COREG) 6.25 MG tablet Take 1 tablet (6.25 mg total) by mouth 2 (two) times daily with a meal.   . cephALEXin (KEFLEX) 500 MG capsule take 1 cap po tid for 7 days   . furosemide (LASIX) 20 MG tablet Take 1 tablet po QD prn   . HYDROcodone-acetaminophen (NORCO) 10-325 MG tablet Take 1 tablet by mouth 3 (three) times daily.    [name] LORazepam (ATIVAN) 0.5 MG tablet Take 1 tablet (0.5 mg total) by mouth 3 (three) times daily as needed for anxiety.   . meloxicam (MOBIC) 7.5 MG tablet Take 7.5 mg by mouth daily as needed for pain.  07/25/2017: PRN  . mirtazapine (REMERON) 30 MG tablet Take 1 tablet (30 mg total) by mouth at bedtime.   . tamsulosin (FLOMAX) 0.4 MG CAPS capsule Take 1 capsule (0.4 mg total) by mouth daily.    No facility-administered encounter medications on file as of 02/14/2018.     Surgical History: Past Surgical History:  Procedure Laterality Date  . ABDOMINAL HYSTERECTOMY    . APPENDECTOMY    . BACK SURGERY    . BREAST BIOPSY Right 2002   neg  . HEMORRHOID SURGERY    . TONSILLECTOMY      Medical History: Past Medical History:  Diagnosis Date  . Allergy    environmental  . Anxiety   . Chronic back pain   . High cholesterol   . History of urinary retention   . Hypertension   . Migraines   . Osteoporosis   . Pneumonia   .  Sinusitis     Family History: Family History  Problem Relation Age of Onset  . Breast cancer Sister 29  . Breast cancer Maternal Grandmother 45  . Breast cancer Sister 22  . Bladder Cancer Brother   . Hypertension Neg Hx   . Kidney cancer Neg Hx     Social History   Socioeconomic History  . Marital status: Widowed    Spouse name: Not on file  . Number of children: Not on file  . Years of education: Not on file  . Highest education level: Not on file  Occupational History  . Occupation: retired  Scientific laboratory technician  . Financial resource strain: Not on file  . Food insecurity:    Worry: Not on file    Inability: Not on file   . Transportation needs:    Medical: Not on file    Non-medical: Not on file  Tobacco Use  . Smoking status: Never Smoker  . Smokeless tobacco: Never Used  Substance and Sexual Activity  . Alcohol use: No  . Drug use: No  . Sexual activity: Not on file  Lifestyle  . Physical activity:    Days per week: Not on file    Minutes per session: Not on file  . Stress: Not on file  Relationships  . Social connections:    Talks on phone: Not on file    Gets together: Not on file    Attends religious service: Not on file    Active member of club or organization: Not on file    Attends meetings of clubs or organizations: Not on file    Relationship status: Not on file  . Intimate partner violence:    Fear of current or ex partner: Not on file    Emotionally abused: Not on file    Physically abused: Not on file    Forced sexual activity: Not on file  Other Topics Concern  . Not on file  Social History Narrative  . Not on file      Review of Systems  Constitutional: Positive for fatigue. Negative for activity change, chills and unexpected weight change.  HENT: Positive for postnasal drip and rhinorrhea. Negative for congestion, sneezing and sore throat.   Eyes: Negative.  Negative for redness.  Respiratory: Positive for chest tightness and shortness of breath. Negative for cough and wheezing.   Cardiovascular: Positive for chest pain, palpitations and leg swelling.       Severe varicose veins  Gastrointestinal: Positive for abdominal pain, constipation and nausea. Negative for diarrhea and vomiting.  Endocrine: Negative for cold intolerance, heat intolerance, polydipsia, polyphagia and polyuria.  Genitourinary: Negative for dysuria and frequency.  Musculoskeletal: Positive for back pain. Negative for arthralgias, joint swelling and neck pain.  Skin: Negative for rash.  Allergic/Immunologic: Positive for environmental allergies. Negative for food allergies and immunocompromised  state.  Neurological: Positive for dizziness, weakness and headaches. Negative for tremors and numbness.  Hematological: Negative for adenopathy. Does not bruise/bleed easily.  Psychiatric/Behavioral: Positive for agitation. Negative for behavioral problems (Depression), sleep disturbance and suicidal ideas. The patient is nervous/anxious.     Vital Signs: BP 128/87   Pulse (!) 105   Resp 16   Ht 5' (1.524 m)   Wt 120 lb 9.6 oz (54.7 kg)   SpO2 93%   BMI 23.55 kg/m    Physical Exam  Constitutional: She is oriented to person, place, and time. She appears well-developed and well-nourished. She appears distressed.  HENT:  Head:  Normocephalic and atraumatic.  Right Ear: External ear normal.  Left Ear: External ear normal.  Mouth/Throat: Oropharynx is clear and moist. No oropharyngeal exudate.  Eyes: Pupils are equal, round, and reactive to light. EOM are normal.  Neck: Normal range of motion. Neck supple. No JVD present. Carotid bruit is not present. No tracheal deviation present. No thyromegaly present.  Cardiovascular: Regular rhythm, intact distal pulses and normal pulses. Tachycardia present. Exam reveals no gallop and no friction rub.  Murmur heard.  Systolic murmur is present with a grade of 2/6. Severe varicose veins, bilaterally. Tenderness with palpation of both legs.  Mild to moderate swelling of bboth lower legs.   Pulmonary/Chest: Effort normal and breath sounds normal. No accessory muscle usage or stridor. No tachypnea. No respiratory distress. She has no decreased breath sounds. She has no rhonchi. She exhibits no tenderness.  Abdominal: Soft. Bowel sounds are normal. There is no hepatosplenomegaly. There is generalized tenderness. There is no CVA tenderness.  Musculoskeletal: Normal range of motion.  Lymphadenopathy:    She has no cervical adenopathy.  Neurological: She is alert and oriented to person, place, and time. She has normal reflexes. No cranial nerve deficit.   She is at her neurological baseline.   Skin: Skin is warm and dry. Capillary refill takes 2 to 3 seconds. She is not diaphoretic.  Psychiatric: Her speech is normal and behavior is normal. Judgment and thought content normal. Her mood appears anxious. Cognition and memory are normal.  Nursing note and vitals reviewed.  Assessment/Plan: 1. Chest pain, unspecified type Attempted to obtain ECG while patient in the office. Mechanical issues prevented this from being successful. Due to chest pain with other symptoms, she was advised to seek care in ER. She agreed and asked that we call paramedics to take her to ER.   2. Shortness of breath unsur if shortness of breath is from chest pain or increased anxiety. o2 saturations are normal. Patient sent to ER due to symptoms.   3. Weakness Patient asked to have paramedics called to take her to ER for further evaluation of weakness, along with chest pain and shortness of breath.   4. Essential (primary) hypertension No changes made to blood pressure medications  General Counseling: Leonor verbalizes understanding of the findings of todays visit and agrees with plan of treatment. I have discussed any further diagnostic evaluation that may be needed or ordered today. We also reviewed her medications today. she has been encouraged to call the office with any questions or concerns that should arise related to todays visit.    This patient was seen by Leretha Pol, FNP- C in Collaboration with Dr Lavera Guise as a part of collaborative care agreement  Time spent: 25 Minutes      Dr Lavera Guise Internal medicine

## 2018-02-21 ENCOUNTER — Ambulatory Visit: Payer: Medicare Other

## 2018-02-22 ENCOUNTER — Telehealth: Payer: Self-pay | Admitting: Nurse Practitioner

## 2018-02-22 ENCOUNTER — Other Ambulatory Visit: Payer: Self-pay | Admitting: Nurse Practitioner

## 2018-02-22 DIAGNOSIS — F411 Generalized anxiety disorder: Secondary | ICD-10-CM

## 2018-02-22 MED ORDER — LORAZEPAM 0.5 MG PO TABS: 0.5000 mg | ORAL_TABLET | Freq: Three times a day (TID) | ORAL | 2 refills | Status: DC | PRN

## 2018-02-22 NOTE — Progress Notes (Signed)
Refilled patient's lorazepam 0.5mg  tID prn and sent it to [facility].

## 2018-02-22 NOTE — Telephone Encounter (Signed)
Refilled patient's lorazepam 0.5mg  tID prn and sent it to [facility].

## 2018-02-23 ENCOUNTER — Ambulatory Visit: Payer: Self-pay | Admitting: Internal Medicine

## 2018-03-02 ENCOUNTER — Ambulatory Visit (INDEPENDENT_AMBULATORY_CARE_PROVIDER_SITE_OTHER): Payer: Medicare Other

## 2018-03-02 ENCOUNTER — Telehealth: Payer: Self-pay | Admitting: Urology

## 2018-03-02 DIAGNOSIS — R339 Retention of urine, unspecified: Secondary | ICD-10-CM | POA: Diagnosis not present

## 2018-03-02 NOTE — Progress Notes (Signed)
Patient present today with daughter complaining of leakage around catheter and bag. Cath was examined and no leakage noted, it was explained that this was most likely bladder spasms. A new bag was attached and it was explained that if urine did not flow into the bag to call for irrigation. Urine today was flowing fine into the bag and patients spasms had stopped.

## 2018-03-02 NOTE — Telephone Encounter (Signed)
[name] called and stated that her mom's catheter is leaking and wanted to bring her in to have it checked. I added her to the nurse schedule.   Sharyn Lull

## 2018-03-09 ENCOUNTER — Ambulatory Visit: Payer: Self-pay | Admitting: Nurse Practitioner

## 2018-03-13 ENCOUNTER — Ambulatory Visit (INDEPENDENT_AMBULATORY_CARE_PROVIDER_SITE_OTHER): Payer: Medicare Other | Admitting: Urology

## 2018-03-13 ENCOUNTER — Encounter: Payer: Self-pay | Admitting: Urology

## 2018-03-13 VITALS — BP 116/80 | HR 97

## 2018-03-13 DIAGNOSIS — R339 Retention of urine, unspecified: Secondary | ICD-10-CM

## 2018-03-13 LAB — BLADDER SCAN AMB NON-IMAGING

## 2018-03-13 NOTE — Progress Notes (Addendum)
03/13/2018 12:41 PM   [name] [birth year] [id number]  Referring provider: Lavera Guise, [place], [place] [address]  Chief Complaint  Patient presents with  . Urinary Retention    HPI: 84 year old female with a history of recurrent urinary retention.  The patient and family requested a voiding trial today.  She is back on tamsulosin.   PMH: Past Medical History:  Diagnosis Date  . Allergy    environmental  . Anxiety   . Chronic back pain   . High cholesterol   . History of urinary retention   . Hypertension   . Migraines   . Osteoporosis   . Pneumonia   . Sinusitis     Surgical History: Past Surgical History:  Procedure Laterality Date  . ABDOMINAL HYSTERECTOMY    . APPENDECTOMY    . BACK SURGERY    . BREAST BIOPSY Right 2002   neg  . HEMORRHOID SURGERY    . TONSILLECTOMY      Home Medications:  Allergies as of 03/13/2018      Reactions   Ceftin [cefuroxime]    Cortisone Other (See Comments)   Headache   Fluticasone Swelling   Facial   Levofloxacin Nausea And Vomiting   Meperidine Other (See Comments)   Morphine And Related Other (See Comments)   headache   Morphine Sulfate    Nisoldipine    Penicillin G Potassium In D5w    Prednisone Other (See Comments)   Headache   Sulfa Antibiotics Nausea Only      Medication List        Accurate as of 03/13/18 12:41 PM. Always use your most recent med list.          amLODipine 2.5 MG tablet Commonly known as:  NORVASC Take 1 tablet (2.5 mg total) by mouth daily.   CALCIUM 600 600 MG Tabs tablet Generic drug:  calcium carbonate Take by mouth.   carvedilol 6.25 MG tablet Commonly known as:  COREG Take 1 tablet (6.25 mg total) by mouth 2 (two) times daily with a meal.   cephALEXin 500 MG capsule Commonly known as:  KEFLEX take 1 cap po tid for 7 days   furosemide 20 MG tablet Commonly known as:  LASIX Take 1 tablet po QD prn   HYDROcodone-acetaminophen 10-325 MG  tablet Commonly known as:  NORCO Take 1 tablet by mouth 3 (three) times daily.   LORazepam 0.5 MG tablet Commonly known as:  ATIVAN Take 1 tablet (0.5 mg total) by mouth 3 (three) times daily as needed for anxiety.   meloxicam 7.5 MG tablet Commonly known as:  MOBIC Take 7.5 mg by mouth daily as needed for pain.   mirtazapine 30 MG tablet Commonly known as:  REMERON Take 1 tablet (30 mg total) by mouth at bedtime.   tamsulosin 0.4 MG Caps capsule Commonly known as:  FLOMAX Take 1 capsule (0.4 mg total) by mouth daily.       Allergies:  Allergies  Allergen Reactions  . Ceftin [Cefuroxime]   . Cortisone Other (See Comments)    Headache   . Fluticasone Swelling    Facial  . Levofloxacin Nausea And Vomiting  . Meperidine Other (See Comments)  . Morphine And Related Other (See Comments)    headache  . Morphine Sulfate   . Nisoldipine   . Penicillin G Potassium In D5w   . Prednisone Other (See Comments)    Headache   . Sulfa Antibiotics Nausea Only  Family History: Family History  Problem Relation Age of Onset  . Breast cancer Sister 55  . Breast cancer Maternal Grandmother 45  . Breast cancer Sister 58  . Bladder Cancer Brother   . Hypertension Neg Hx   . Kidney cancer Neg Hx     Social History:  reports that she has never smoked. She has never used smokeless tobacco. She reports that she does not drink alcohol or use drugs.  ROS: UROLOGY Frequent Urination?: No Hard to postpone urination?: No Burning/pain with urination?: No Get up at night to urinate?: No Leakage of urine?: No Urine stream starts and stops?: No Trouble starting stream?: Yes Do you have to strain to urinate?: No Blood in urine?: No Urinary tract infection?: No Sexually transmitted disease?: No Injury to kidneys or bladder?: No Painful intercourse?: No Weak stream?: No Currently pregnant?: No Vaginal bleeding?: No Last menstrual period?: N/A  Gastrointestinal Nausea?:  No Vomiting?: No Indigestion/heartburn?: No Diarrhea?: No Constipation?: Yes  Constitutional Fever: No Night sweats?: No Weight loss?: No Fatigue?: No  Skin Skin rash/lesions?: No Itching?: No  Eyes Blurred vision?: No Double vision?: No  Ears/Nose/Throat Sore throat?: No Sinus problems?: No  Hematologic/Lymphatic Swollen glands?: No Easy bruising?: No  Cardiovascular Leg swelling?: Yes Chest pain?: No  Respiratory Cough?: No Shortness of breath?: No  Endocrine Excessive thirst?: No  Musculoskeletal Back pain?: Yes Joint pain?: No  Neurological Headaches?: No Dizziness?: No  Psychologic Depression?: No Anxiety?: No  Physical Exam: BP 116/80 (BP Location: Left Arm, Patient Position: Sitting, Cuff Size: Normal)   Pulse 97   Constitutional:  Alert and oriented, No acute distress. HEENT: [place] AT, moist mucus membranes.  Trachea midline, no masses. Cardiovascular: No clubbing, cyanosis, or edema. Respiratory: Normal respiratory effort, no increased work of breathing. GI: Abdomen is soft, nontender, nondistended, no abdominal masses GU: No CVA tenderness Lymph: No cervical or inguinal lymphadenopathy. Skin: No rashes, bruises or suspicious lesions. Neurologic: Grossly intact, no focal deficits, moving all 4 extremities. Psychiatric: Normal mood and affect.    Assessment & Plan:   Repeat voiding trial was performed.  240 mL of sterile saline was placed in the bladder prior to catheter removal.  She was able to void 100 mL.  They did not desire replacement of her catheter immediately and will return later today for repeat bladder scan.  She returned later this afternoon stating she had been voiding.  PVR by bladder scan was 0 mL.  Will have her follow-up in 3 months with [facility].  She was to have a follow-up MRI for a small renal mass which does not appear to have been scheduled.  03/15/2018: When contacted regarding MRI scheduling the family and patient  has decided they do not want to have the small renal mass monitored and declined scheduling the MRI.   Abbie Sons, [place] [address], [place], [place] [address] [phone]

## 2018-03-13 NOTE — Progress Notes (Signed)
Pt returns to clinic for PVR. Pt states that she has been able to urinate on her own at home. Denies any complaints at this time. Scan shows 64mL. Pt advised to f/u as scheduled or sooner if symptoms arise.

## 2018-03-14 ENCOUNTER — Encounter: Payer: Self-pay | Admitting: Urology

## 2018-03-15 ENCOUNTER — Other Ambulatory Visit: Payer: Self-pay | Admitting: Nurse Practitioner

## 2018-03-15 ENCOUNTER — Telehealth: Payer: Self-pay | Admitting: Nurse Practitioner

## 2018-03-15 DIAGNOSIS — J0141 Acute recurrent pansinusitis: Secondary | ICD-10-CM

## 2018-03-15 DIAGNOSIS — J3481 Nasal mucositis (ulcerative): Secondary | ICD-10-CM

## 2018-03-15 MED ORDER — MUPIROCIN 2 % EX OINT: 1.0000 "application " | TOPICAL_OINTMENT | Freq: Two times a day (BID) | CUTANEOUS | 1 refills | Status: DC

## 2018-03-15 MED ORDER — AZITHROMYCIN 250 MG PO TABS: ORAL_TABLET | ORAL | 0 refills | Status: DC

## 2018-03-15 NOTE — Telephone Encounter (Signed)
Patient called office c/o sinusitis, congestion with blood tinged mucus. Sent in z-pack as antibiotic. Take as directed for 5 days. Also added mupirocin ointment. Apply to nasal mucosa twice daily. Rest and increase fluids.

## 2018-03-15 NOTE — Progress Notes (Signed)
Patient called office c/o sinusitis, congestion with blood tinged mucus. Sent in z-pack as antibiotic. Take as directed for 5 days. Also added mupirocin ointment. Apply to nasal mucosa twice daily. Rest and increase fluids.

## 2018-03-17 ENCOUNTER — Other Ambulatory Visit: Payer: Self-pay | Admitting: Nurse Practitioner

## 2018-03-17 ENCOUNTER — Telehealth: Payer: Self-pay | Admitting: Nurse Practitioner

## 2018-03-17 DIAGNOSIS — F411 Generalized anxiety disorder: Secondary | ICD-10-CM

## 2018-03-17 DIAGNOSIS — J0141 Acute recurrent pansinusitis: Secondary | ICD-10-CM

## 2018-03-17 MED ORDER — MIRTAZAPINE 30 MG PO TABS: 30.0000 mg | ORAL_TABLET | Freq: Every day | ORAL | 2 refills | Status: DC

## 2018-03-17 MED ORDER — LORAZEPAM 0.5 MG PO TABS: 0.5000 mg | ORAL_TABLET | Freq: Three times a day (TID) | ORAL | 2 refills | Status: DC | PRN

## 2018-03-17 MED ORDER — DOXYCYCLINE HYCLATE 50 MG PO TABS: 50.0000 mg | ORAL_TABLET | Freq: Two times a day (BID) | ORAL | 0 refills | Status: DC

## 2018-03-17 NOTE — Telephone Encounter (Signed)
Informed pt that prescriptions are at drug store.

## 2018-03-17 NOTE — Telephone Encounter (Signed)
D/c z-pack due to diarrhea. Changed abx to doxycycline 50mg  twice daily for 10 days. Renewed lorazepam and mirtazapine and sent all to [facility].

## 2018-03-17 NOTE — Progress Notes (Signed)
D/c z-pack due to diarrhea. Changed abx to doxycycline 50mg  twice daily for 10 days. Renewed lorazepam and mirtazapine and sent all to [facility].

## 2018-03-20 ENCOUNTER — Telehealth: Payer: Self-pay | Admitting: Nurse Practitioner

## 2018-03-20 ENCOUNTER — Other Ambulatory Visit: Payer: Self-pay | Admitting: Nurse Practitioner

## 2018-03-20 DIAGNOSIS — J0141 Acute recurrent pansinusitis: Secondary | ICD-10-CM

## 2018-03-20 MED ORDER — CEPHALEXIN 500 MG PO CAPS: 500.0000 mg | ORAL_CAPSULE | Freq: Three times a day (TID) | ORAL | 0 refills | Status: DC

## 2018-03-20 MED ORDER — CEPHALEXIN 500 MG PO CAPS: 500.0000 mg | ORAL_CAPSULE | Freq: Three times a day (TID) | ORAL | 0 refills | Status: AC

## 2018-03-20 NOTE — Progress Notes (Signed)
D/c doxycycline due to diarrhea. Changed to cephalexin 500mg  tid for 7 days per patient request. Sent to [facility]. Per patient, this is only antibiotic she is able to tolerate.

## 2018-03-20 NOTE — Telephone Encounter (Signed)
D/c doxycycline due to diarrhea. Changed to cephalexin 500mg  tid for 7 days per patient request. Sent to [facility]. Per patient, this is only antibiotic she is able to tolerate.

## 2018-03-20 NOTE — Telephone Encounter (Signed)
Informed pt that new prescription was sent to pharmacy

## 2018-03-22 ENCOUNTER — Ambulatory Visit: Payer: Self-pay | Admitting: Internal Medicine

## 2018-03-22 ENCOUNTER — Emergency Department
Admission: EM | Admit: 2018-03-22 | Discharge: 2018-03-22 | Disposition: A | Discharge status: Home / Self Care | Payer: Medicare Other | Attending: Emergency Medicine | Admitting: Emergency Medicine

## 2018-03-22 ENCOUNTER — Emergency Department: Payer: Medicare Other

## 2018-03-22 ENCOUNTER — Encounter: Payer: Self-pay | Admitting: Emergency Medicine

## 2018-03-22 ENCOUNTER — Ambulatory Visit: Payer: Medicare Other

## 2018-03-22 DIAGNOSIS — I10 Essential (primary) hypertension: Secondary | ICD-10-CM | POA: Insufficient documentation

## 2018-03-22 DIAGNOSIS — R05 Cough: Secondary | ICD-10-CM | POA: Diagnosis not present

## 2018-03-22 DIAGNOSIS — R51 Headache: Secondary | ICD-10-CM | POA: Diagnosis not present

## 2018-03-22 DIAGNOSIS — R0789 Other chest pain: Secondary | ICD-10-CM | POA: Insufficient documentation

## 2018-03-22 DIAGNOSIS — H5712 Ocular pain, left eye: Secondary | ICD-10-CM | POA: Diagnosis not present

## 2018-03-22 DIAGNOSIS — S0990XA Unspecified injury of head, initial encounter: Secondary | ICD-10-CM | POA: Diagnosis not present

## 2018-03-22 DIAGNOSIS — J3489 Other specified disorders of nose and nasal sinuses: Secondary | ICD-10-CM

## 2018-03-22 DIAGNOSIS — Z79899 Other long term (current) drug therapy: Secondary | ICD-10-CM | POA: Insufficient documentation

## 2018-03-22 DIAGNOSIS — R079 Chest pain, unspecified: Secondary | ICD-10-CM

## 2018-03-22 DIAGNOSIS — S0993XA Unspecified injury of face, initial encounter: Secondary | ICD-10-CM | POA: Diagnosis not present

## 2018-03-22 LAB — BASIC METABOLIC PANEL
Anion gap: 8 (ref 5–15)
BUN: 11 mg/dL (ref 6–20)
CHLORIDE: 95 mmol/L — AB (ref 101–111)
CO2: 26 mmol/L (ref 22–32)
Calcium: 10 mg/dL (ref 8.9–10.3)
Creatinine, Ser: 0.73 mg/dL (ref 0.44–1.00)
GFR calc Af Amer: >60 mL/min (ref >60)
GFR calc non Af Amer: >60 mL/min (ref >60)
Glucose, Bld: 113 mg/dL — ABNORMAL HIGH (ref 65–99)
POTASSIUM: 4.2 mmol/L (ref 3.5–5.1)
Sodium: 129 mmol/L — ABNORMAL LOW (ref 135–145)

## 2018-03-22 LAB — URINALYSIS, COMPLETE (UACMP) WITH MICROSCOPIC
BILIRUBIN URINE: NEGATIVE
Bacteria, UA: NONE SEEN
Glucose, UA: NEGATIVE mg/dL
HGB URINE DIPSTICK: NEGATIVE
Ketones, ur: NEGATIVE mg/dL
LEUKOCYTES UA: NEGATIVE
NITRITE: NEGATIVE
PROTEIN: NEGATIVE mg/dL
Specific Gravity, Urine: 1.003 — ABNORMAL LOW (ref 1.005–1.030)
pH: 7 (ref 5.0–8.0)

## 2018-03-22 LAB — CBC
HEMATOCRIT: 41.8 % (ref 35.0–47.0)
Hemoglobin: 14.2 g/dL (ref 12.0–16.0)
MCH: 29.7 pg (ref 26.0–34.0)
MCHC: 33.9 g/dL (ref 32.0–36.0)
MCV: 87.4 fL (ref 80.0–100.0)
Platelets: 170 10*3/uL (ref 150–440)
RBC: 4.79 MIL/uL (ref 3.80–5.20)
RDW: 14.4 % (ref 11.5–14.5)
WBC: 4.6 10*3/uL (ref 3.6–11.0)

## 2018-03-22 LAB — TROPONIN I: Troponin I: <0.03 ng/mL (ref <0.03)

## 2018-03-22 MED ORDER — PROCHLORPERAZINE EDISYLATE 10 MG/2ML IJ SOLN
10.0000 mg | Freq: Once | INTRAMUSCULAR | Status: AC
  Administered 2018-03-22: 10 mg via INTRAVENOUS
  Filled 2018-03-22: qty 2

## 2018-03-22 MED ORDER — SODIUM CHLORIDE 0.9 % IV BOLUS
500.0000 mL | Freq: Once | INTRAVENOUS | Status: AC
  Administered 2018-03-22: 500 mL via INTRAVENOUS

## 2018-03-22 MED ORDER — OXYCODONE HCL 5 MG PO TABS: 5.0000 mg | ORAL_TABLET | Freq: Once | ORAL | Status: DC

## 2018-03-22 MED ORDER — DIPHENHYDRAMINE HCL 50 MG/ML IJ SOLN
25.0000 mg | Freq: Once | INTRAMUSCULAR | Status: AC
  Administered 2018-03-22: 25 mg via INTRAVENOUS
  Filled 2018-03-22: qty 1

## 2018-03-22 NOTE — ED Notes (Signed)
Pt assisted to bathroom by this tech and Melanie,NT. Pt stated "I feel like I have a bladder infection." Shelly Rubenstein made aware and will order a UA.

## 2018-03-22 NOTE — ED Triage Notes (Addendum)
Pt arrived with family with complaints of chest tightness, cough, and headache that started last Friday. Pt was seen at walk in clinic who sent pt into ED for further evaluation of dizziness and chest tightness. Pt was prescribed cephalexin, doxycycline, and a zpac last week. Pt states she called the doctor for a "sinus infection," and he called antibiotics in. Pt was unable to tolerate antibiotics due to them causing diarrhea and only took "a few of them."

## 2018-03-22 NOTE — ED Notes (Signed)
Spoke with MD Paduchowski no new orders at this time.

## 2018-03-22 NOTE — ED Notes (Signed)
E-signature pad unavailable at this time.  Patient and family verbalized understanding of discharge instructions and follow up information.

## 2018-03-22 NOTE — ED Provider Notes (Signed)
[facility] Emergency Department Provider Note  ____________________________________________   First MD Initiated Contact with Patient 03/22/18 1402     (approximate)  I have reviewed the triage vital signs and the nursing notes.  HISTORY  Chief Complaint Chest Pain; Headache; and Cough  HPI [name] is a 84 y.o. female with a history of migraine headaches, anxiety and chronic pain on hydrocodone who is presenting to the emergency department with medial and upper left eye pain as well as chest tightness, left ear pain x1 week.  She says that she feels this is similar to when she has had sinus issues before and a "ulcer" in her nose which was cauterized by Dr. Marella Bile.  She is here with her daughter who says that she has been taking hydrocodone without relief.  Patient does not report any vision changes.  Denies any chest pain at this time.  Has been tried on 3 antibiotics over the past week with resultant diarrhea and therefore discontinuation of the antibiotics.  Patient continues to report chest tightness across the front of the chest but no reports of shortness of breath.  No vomiting.  [name] who is at the bedside says that the patient has been complaining of pain that is ongoing approximately over the past week.  Past Medical History:  Diagnosis Date  . Allergy    environmental  . Anxiety   . Chronic back pain   . High cholesterol   . History of urinary retention   . Hypertension   . Migraines   . Osteoporosis   . Pneumonia   . Sinusitis     Patient Active Problem List   Diagnosis Date Noted  . Chest pain 02/16/2018  . Shortness of breath 02/16/2018  . Weakness 02/16/2018  . Arm fracture, left 01/03/2018  . Arthritis 01/03/2018  . Compression fracture of thoracic vertebra ([place]) 01/03/2018  . Emphysema lung ([place]) 01/03/2018  . GERD (gastroesophageal reflux disease) 01/03/2018  . GI bleeding 01/03/2018  . Lumbar spondylosis  01/03/2018  . Menstrual problem 01/03/2018  . Osteoarthritis of knee 01/03/2018  . Osteoporosis 01/03/2018  . Other specified joint disorders, right knee 01/03/2018  . Ankle sprain 01/03/2018  . Synovial cyst of popliteal space 01/03/2018  . History of urinary retention 11/10/2017  . Generalized anxiety disorder 10/24/2017  . Constipation 10/24/2017  . Internal hemorrhoids 10/24/2017  . Essential (primary) hypertension 10/24/2017  . Thrombocytopenia ([place]) 10/24/2017  . Acute encephalopathy 08/04/2017  . Hyponatremia 12/15/2015    Past Surgical History:  Procedure Laterality Date  . ABDOMINAL HYSTERECTOMY    . APPENDECTOMY    . BACK SURGERY    . BREAST BIOPSY Right 2002   neg  . HEMORRHOID SURGERY    . TONSILLECTOMY      Prior to Admission medications   Medication Sig Start Date End Date Taking? Authorizing Provider  amLODipine (NORVASC) 2.5 MG tablet Take 1 tablet (2.5 mg total) by mouth daily. 02/16/18   Ronnell Freshwater, NP  calcium carbonate (CALCIUM 600) 600 MG TABS tablet Take by mouth.    [provider]  carvedilol (COREG) 6.25 MG tablet Take 1 tablet (6.25 mg total) by mouth 2 (two) times daily with a meal. 01/06/18   Lavera Guise, MD  cephALEXin (KEFLEX) 500 MG capsule Take 1 capsule (500 mg total) by mouth 3 (three) times daily for 7 days. 03/20/18 03/27/18  Ronnell Freshwater, NP  furosemide (LASIX) 20 MG tablet Take 1 tablet po  QD prn 11/29/17   Ronnell Freshwater, NP  HYDROcodone-acetaminophen (NORCO) 10-325 MG tablet Take 1 tablet by mouth 3 (three) times daily.     [provider]  LORazepam (ATIVAN) 0.5 MG tablet Take 1 tablet (0.5 mg total) by mouth 3 (three) times daily as needed for anxiety. 03/17/18   Ronnell Freshwater, NP  meloxicam (MOBIC) 7.5 MG tablet Take 7.5 mg by mouth daily as needed for pain.     [provider]  mirtazapine (REMERON) 30 MG tablet Take 1 tablet (30 mg total) by mouth at bedtime. 03/17/18   Ronnell Freshwater, NP    mupirocin ointment (BACTROBAN) 2 % Place 1 application into the nose 2 (two) times daily. 03/15/18   Ronnell Freshwater, NP  tamsulosin (FLOMAX) 0.4 MG CAPS capsule Take 1 capsule (0.4 mg total) by mouth daily. 11/17/17   Zara Council A, PA-C    Allergies Ceftin [cefuroxime]; Cortisone; Fluticasone; Levofloxacin; Meperidine; Morphine and related; Morphine sulfate; Nisoldipine; Penicillin g potassium in d5w; Prednisone; and Sulfa antibiotics  Family History  Problem Relation Age of Onset  . Breast cancer Sister 22  . Breast cancer Maternal Grandmother 78  . Breast cancer Sister 37  . Bladder Cancer Brother   . Hypertension Neg Hx   . Kidney cancer Neg Hx     Social History Social History   Tobacco Use  . Smoking status: Never Smoker  . Smokeless tobacco: Never Used  Substance Use Topics  . Alcohol use: No  . Drug use: No    Review of Systems  Constitutional: No fever/chills Eyes: No visual changes. ENT: No sore throat. Cardiovascular: As above Respiratory: Denies shortness of breath. Gastrointestinal: No abdominal pain.  No nausea, no vomiting.  No diarrhea.  No constipation. Genitourinary: Negative for dysuria. Musculoskeletal: Chronic lower back pain which is unchanged. Skin: Negative for rash. Neurological: Negative for focal weakness or numbness.   ____________________________________________   PHYSICAL EXAM:  VITAL SIGNS: ED Triage Vitals  Enc Vitals Group     BP 03/22/18 1055 112/78     Pulse --      Resp 03/22/18 1055 18     Temp 03/22/18 1055 97.9 F (36.6 C)     Temp Source 03/22/18 1055 Oral     SpO2 03/22/18 1055 99 %     Weight 03/22/18 1051 118 lb (53.5 kg)     Height 03/22/18 1051 5' (1.524 m)     Head Circumference --      Peak Flow --      Pain Score 03/22/18 1050 10     Pain Loc --      Pain Edu? --      Excl. in [place]? --     Constitutional: Alert and oriented. Well appearing and in no acute distress. Eyes: Conjunctivae are normal.  PERRL. EOMI. Head: Atraumatic.  No tenderness to palpation along the distribution of the temporal arteries.  Left-sided TM is normal with a normal left-sided canal.  Stark Klein is also normal without any swelling, erythema or tenderness. Nose: No congestion/rhinnorhea.  I do not see any ulceration.  Minimal rhinorrhea to the right nare.  No mucosal irritation to the bilateral nares. Mouth/Throat: Mucous membranes are moist.  Oropharynx non-erythematous. Neck: No stridor.   Cardiovascular: Normal rate, regular rhythm. Grossly normal heart sounds.   Respiratory: Normal respiratory effort.  No retractions. Lungs CTAB. Gastrointestinal: Soft and nontender. No distention. No abdominal bruits.  Musculoskeletal: No lower extremity tenderness nor edema.  No  joint effusions. Neurologic:  Normal speech and language. No gross focal neurologic deficits are appreciated. No gait instability. Skin:  Skin is warm, dry and intact. No rash noted. Psychiatric: Mood and affect are normal. Speech and behavior are normal.  ____________________________________________   LABS (all labs ordered are listed, but only abnormal results are displayed)  Labs Reviewed  BASIC METABOLIC PANEL - Abnormal; Notable for the following components:      Result Value   Sodium 129 (*)    Chloride 95 (*)    Glucose, Bld 113 (*)    All other components within normal limits  URINALYSIS, COMPLETE (UACMP) WITH MICROSCOPIC - Abnormal; Notable for the following components:   Color, Urine YELLOW (*)    APPearance CLEAR (*)    Specific Gravity, Urine 1.003 (*)    All other components within normal limits  CBC  TROPONIN I   ____________________________________________  EKG  ED ECG REPORT I, Doran Stabler, the attending physician, personally viewed and interpreted this ECG.   Date: 03/22/2018  EKG Time: 1052  Rate: 96  Rhythm: normal sinus rhythm  Axis: Right superior axis deviation.  Intervals:none  ST&T Change: No ST  segment elevation or depression.  T wave inversions in V2 as well as V3. No significant change from EKGs on the record. ____________________________________________  RADIOLOGY  ED MD interpretation: Agree with radiology interpretation.  No acute finding.  Official radiology report(s): Dg Chest 2 View  Result Date: 03/22/2018 CLINICAL DATA:  Cough and chest tightness; dizziness EXAM: CHEST - 2 VIEW COMPARISON:  Feb 14, 2018 chest radiograph and chest CT December 10, 2016 FINDINGS: There is no edema or consolidation. Heart is mildly enlarged with pulmonary vascularity normal. No adenopathy. Aorta is tortuous with aortic atherosclerosis. There is stable anterior wedging of a midthoracic vertebral body. There is postoperative change in the lumbar region. Bones are osteoporotic. There is mild rightward deviation of the upper thoracic trachea, stable. IMPRESSION: No edema or consolidation. Stable cardiac prominence. Tortuous aorta with aortic atherosclerosis. Upper thoracic tracheal deviation toward the right. Question thyroid enlargement in this area. Aortic Atherosclerosis (ICD10-I70.0). Electronically Signed   By: Lowella Grip III M.D.   On: 03/22/2018 11:23   CT head without any acute finding.  Maxillofacial scan also without any acute finding.  No acute sinusitis identified. ____________________________________________   PROCEDURES  Procedure(s) performed:   Procedures  Critical Care performed:   ____________________________________________   INITIAL IMPRESSION / ASSESSMENT AND PLAN / ED COURSE  As part of my medical decision making, I reviewed the following data within the electronic MEDICAL RECORD NUMBER    Differential diagnosis includes, but is not limited to, ACS, aortic dissection, pulmonary embolism, cardiac tamponade, pneumothorax, pneumonia, pericarditis, myocarditis, GI-related causes including esophagitis/gastritis, and musculoskeletal chest wall pain.   Differential diagnosis  includes, but is not limited to, intracranial hemorrhage, meningitis/encephalitis, previous head trauma, cavernous venous thrombosis, tension headache, temporal arteritis, migraine or migraine equivalent, idiopathic intracranial hypertension, and non-specific headache. As part of my medical decision making, I reviewed the following data within the electronic MEDICAL RECORD NUMBER Notes from prior ED visits  ----------------------------------------- 4:13 PM on 03/22/2018 -----------------------------------------  Patient at this time appears more calm.  Is not complaining of pain is frequently and the daughter says that she is at her baseline.  I recommended follow-up with Dr. Pryor Ochoa.  CAT scans of the head and face as well as cardiac work-up are very reassuring today.  Patient will be discharged home.  Patient as well  as family understand the plan and willing to comply. ____________________________________________   FINAL CLINICAL IMPRESSION(S) / ED DIAGNOSES  Sinus pain.  Chest pain.   ED Discharge Orders    None       Note:  This document was prepared using Dragon voice recognition software and may include unintentional dictation errors.    Orbie Pyo, MD 03/22/18 [phone]

## 2018-03-22 NOTE — ED Notes (Signed)
Spoke with pt about wait times and what to expect next. Advised pt that I am available for further questions if needed.  

## 2018-03-31 ENCOUNTER — Ambulatory Visit: Payer: Self-pay | Admitting: Nurse Practitioner

## 2018-04-03 ENCOUNTER — Telehealth: Payer: Self-pay

## 2018-04-03 ENCOUNTER — Ambulatory Visit: Payer: Self-pay | Admitting: Nurse Practitioner

## 2018-04-03 DIAGNOSIS — I10 Essential (primary) hypertension: Secondary | ICD-10-CM

## 2018-04-03 MED ORDER — AMLODIPINE BESYLATE 2.5 MG PO TABS: 2.5000 mg | ORAL_TABLET | Freq: Every day | ORAL | 3 refills | Status: DC

## 2018-04-03 NOTE — Telephone Encounter (Signed)
Send med 

## 2018-04-05 ENCOUNTER — Telehealth: Payer: Self-pay

## 2018-04-05 ENCOUNTER — Other Ambulatory Visit: Payer: Self-pay | Admitting: Internal Medicine

## 2018-04-05 DIAGNOSIS — R601 Generalized edema: Secondary | ICD-10-CM

## 2018-04-05 MED ORDER — FUROSEMIDE 20 MG PO TABS: ORAL_TABLET | ORAL | 2 refills | Status: DC

## 2018-04-05 NOTE — Telephone Encounter (Signed)
PT CALLED THAT SHE RAN OUT LORAZEPAM AND SHE WANT [place] TO CALL [facility]AR AND APPROVED TO FILL ONE WEEK EARLY AS PER HEATHER I CALLED PHAR AND TOLD THEM TO FILLED EARLY BUT SHE HAD TO PAY FROM HER POCKET AND ALSO WE NOT CHANGING ANY DIRECTION AND ALSO ADVISED PT NEXT TIME WE NOT ABLE TO DO THIS

## 2018-04-14 ENCOUNTER — Other Ambulatory Visit: Payer: Self-pay | Admitting: Nurse Practitioner

## 2018-04-14 ENCOUNTER — Ambulatory Visit: Payer: Self-pay | Admitting: Nurse Practitioner

## 2018-04-14 ENCOUNTER — Telehealth: Payer: Self-pay

## 2018-04-14 DIAGNOSIS — K1230 Oral mucositis (ulcerative), unspecified: Principal | ICD-10-CM

## 2018-04-14 DIAGNOSIS — K121 Other forms of stomatitis: Secondary | ICD-10-CM

## 2018-04-14 MED ORDER — CEPHALEXIN 500 MG PO CAPS: 500.0000 mg | ORAL_CAPSULE | Freq: Three times a day (TID) | ORAL | 0 refills | Status: DC

## 2018-04-14 NOTE — Telephone Encounter (Signed)
As far as I know, she does not have heart condition. However, I sent in rx for keflex 500mg  tid for 10 days for dental work prophylaxis.

## 2018-04-14 NOTE — Progress Notes (Signed)
Sent in rx for keflex 500mg  tid for 10 days for dental work prophylaxis.

## 2018-04-21 ENCOUNTER — Telehealth: Payer: Self-pay | Admitting: Urology

## 2018-04-21 DIAGNOSIS — R339 Retention of urine, unspecified: Secondary | ICD-10-CM

## 2018-04-21 MED ORDER — TAMSULOSIN HCL 0.4 MG PO CAPS: 0.4000 mg | ORAL_CAPSULE | Freq: Every day | ORAL | 6 refills | Status: DC

## 2018-04-21 NOTE — Addendum Note (Signed)
Addended by: Donalee Citrin on: 04/21/2018 04:08 PM   Modules accepted: Orders

## 2018-04-21 NOTE — Telephone Encounter (Signed)
Pt called requesting a refill Tamsulosin 0.4mg  [facility] in [place]. Please advise pt at [phone].

## 2018-04-26 ENCOUNTER — Other Ambulatory Visit: Payer: Self-pay | Admitting: Nurse Practitioner

## 2018-04-26 ENCOUNTER — Telehealth: Payer: Self-pay

## 2018-04-26 DIAGNOSIS — F411 Generalized anxiety disorder: Secondary | ICD-10-CM

## 2018-04-26 MED ORDER — LORAZEPAM 0.5 MG PO TABS: 0.5000 mg | ORAL_TABLET | Freq: Three times a day (TID) | ORAL | 2 refills | Status: DC | PRN

## 2018-04-26 NOTE — Telephone Encounter (Signed)
Sent ok to pharmacy to fill lorazepam at this time. Done this time only.

## 2018-04-26 NOTE — Progress Notes (Signed)
Sent ok to pharmacy to fill lorazepam at this time. Done this time only.

## 2018-04-27 ENCOUNTER — Telehealth: Payer: Self-pay

## 2018-04-27 NOTE — Telephone Encounter (Signed)
Pt advised we send lorazepam this time no more

## 2018-04-28 ENCOUNTER — Encounter: Payer: Self-pay | Admitting: Adult Health

## 2018-04-28 ENCOUNTER — Other Ambulatory Visit: Payer: Self-pay | Admitting: Internal Medicine

## 2018-04-28 ENCOUNTER — Ambulatory Visit (INDEPENDENT_AMBULATORY_CARE_PROVIDER_SITE_OTHER): Payer: Medicare Other | Admitting: Adult Health

## 2018-04-28 ENCOUNTER — Ambulatory Visit: Payer: Self-pay | Admitting: Nurse Practitioner

## 2018-04-28 VITALS — BP 108/68 | HR 100 | Temp 98.2°F | Resp 16 | Ht 60.0 in | Wt 119.0 lb

## 2018-04-28 DIAGNOSIS — R601 Generalized edema: Secondary | ICD-10-CM

## 2018-04-28 DIAGNOSIS — R300 Dysuria: Secondary | ICD-10-CM

## 2018-04-28 DIAGNOSIS — K047 Periapical abscess without sinus: Secondary | ICD-10-CM | POA: Diagnosis not present

## 2018-04-28 DIAGNOSIS — I10 Essential (primary) hypertension: Secondary | ICD-10-CM

## 2018-04-28 LAB — POCT URINALYSIS DIPSTICK
Bilirubin, UA: NEGATIVE
Blood, UA: NEGATIVE
Glucose, UA: NEGATIVE
Ketones, UA: NEGATIVE
Nitrite, UA: NEGATIVE
PROTEIN UA: NEGATIVE
SPEC GRAV UA: 1.015 (ref 1.010–1.025)
Urobilinogen, UA: NEGATIVE E.U./dL — AB
pH, UA: 5 (ref 5.0–8.0)

## 2018-04-28 MED ORDER — AMLODIPINE BESYLATE 5 MG PO TABS: 5.0000 mg | ORAL_TABLET | Freq: Every day | ORAL | 2 refills | Status: DC

## 2018-04-28 MED ORDER — AMLODIPINE BESYLATE 2.5 MG PO TABS: 5.0000 mg | ORAL_TABLET | Freq: Every day | ORAL | 3 refills | Status: DC

## 2018-04-28 MED ORDER — FUROSEMIDE 20 MG PO TABS: ORAL_TABLET | ORAL | 2 refills | Status: DC

## 2018-04-28 MED ORDER — CARVEDILOL 6.25 MG PO TABS: 6.2500 mg | ORAL_TABLET | Freq: Two times a day (BID) | ORAL | 2 refills | Status: DC

## 2018-04-28 NOTE — Progress Notes (Signed)
[facility] [place],  [address]  Internal MEDICINE  Office Visit Note  Patient Name: [name]  [id number]  [id number]  Date of Service: 05/04/2018  Chief Complaint  Patient presents with  . Medical Clearance    having 7 teeth removed , swollen glands ,needs medicatons refilled, needs pneumonia shot   . Urinary Tract Infection    possible uti    HPI  Pt here for medical clearance for dental work.  Patient states the dentist is going to pull 6 teeth and shave her jaw bone down.  He did not request medical clearance, the patient is here on her own accord.  She states he is going to use local anesthetic.    Current Medication: Outpatient Encounter Medications as of 04/28/2018  Medication Sig Note  . calcium carbonate (CALCIUM 600) 600 MG TABS tablet Take by mouth.   . cephALEXin (KEFLEX) 500 MG capsule Take 1 capsule (500 mg total) by mouth 3 (three) times daily.   [name] HYDROcodone-acetaminophen (NORCO) 10-325 MG tablet Take 1 tablet by mouth 3 (three) times daily.    [name] LORazepam (ATIVAN) 0.5 MG tablet Take 1 tablet (0.5 mg total) by mouth 3 (three) times daily as needed for anxiety.   . meloxicam (MOBIC) 7.5 MG tablet Take 7.5 mg by mouth daily as needed for pain.  07/25/2017: PRN  . mirtazapine (REMERON) 30 MG tablet Take 1 tablet (30 mg total) by mouth at bedtime.   . mupirocin ointment (BACTROBAN) 2 % Place 1 application into the nose 2 (two) times daily.   . tamsulosin (FLOMAX) 0.4 MG CAPS capsule Take 1 capsule (0.4 mg total) by mouth daily.   . [DISCONTINUED] amLODipine (NORVASC) 2.5 MG tablet Take 1 tablet (2.5 mg total) by mouth daily.   . [DISCONTINUED] carvedilol (COREG) 6.25 MG tablet Take 1 tablet (6.25 mg total) by mouth 2 (two) times daily with a meal.   . [DISCONTINUED] furosemide (LASIX) 20 MG tablet Take 1 tablet po QD prn    No facility-administered encounter medications on file as of 04/28/2018.     Surgical History: Past  Surgical History:  Procedure Laterality Date  . ABDOMINAL HYSTERECTOMY    . APPENDECTOMY    . BACK SURGERY    . BREAST BIOPSY Right 2002   neg  . HEMORRHOID SURGERY    . TONSILLECTOMY      Medical History: Past Medical History:  Diagnosis Date  . Allergy    environmental  . Anxiety   . Chronic back pain   . High cholesterol   . History of urinary retention   . Hypertension   . Migraines   . Osteoporosis   . Pneumonia   . Sinusitis     Family History: Family History  Problem Relation Age of Onset  . Breast cancer Sister 49  . Breast cancer Maternal Grandmother 84  . Breast cancer Sister 37  . Bladder Cancer Brother   . Hypertension Neg Hx   . Kidney cancer Neg Hx     Social History   Socioeconomic History  . Marital status: Widowed    Spouse name: Not on file  . Number of children: Not on file  . Years of education: Not on file  . Highest education level: Not on file  Occupational History  . Occupation: retired  Scientific laboratory technician  . Financial resource strain: Not on file  . Food insecurity:    Worry: Not on file    Inability: Not  on file  . Transportation needs:    Medical: Not on file    Non-medical: Not on file  Tobacco Use  . Smoking status: Never Smoker  . Smokeless tobacco: Never Used  Substance and Sexual Activity  . Alcohol use: No  . Drug use: No  . Sexual activity: Not Currently  Lifestyle  . Physical activity:    Days per week: Not on file    Minutes per session: Not on file  . Stress: Not on file  Relationships  . Social connections:    Talks on phone: Not on file    Gets together: Not on file    Attends religious service: Not on file    Active member of club or organization: Not on file    Attends meetings of clubs or organizations: Not on file    Relationship status: Not on file  . Intimate partner violence:    Fear of current or ex partner: Not on file    Emotionally abused: Not on file    Physically abused: Not on file    Forced  sexual activity: Not on file  Other Topics Concern  . Not on file  Social History Narrative  . Not on file   Review of Systems  Constitutional: Negative for chills, fatigue and unexpected weight change.  HENT: Positive for sore throat. Negative for congestion, rhinorrhea and sneezing.   Eyes: Negative for photophobia, pain and redness.  Respiratory: Negative for cough, chest tightness and shortness of breath.   Cardiovascular: Negative for chest pain and palpitations.  Gastrointestinal: Negative for abdominal pain, constipation, diarrhea, nausea and vomiting.  Endocrine: Negative.   Genitourinary: Negative for dysuria and frequency.  Musculoskeletal: Negative for arthralgias, back pain, joint swelling and neck pain.  Skin: Negative for rash.  Allergic/Immunologic: Negative.   Neurological: Negative for tremors and numbness.  Hematological: Negative for adenopathy. Does not bruise/bleed easily.  Psychiatric/Behavioral: Negative for behavioral problems and sleep disturbance. The patient is not nervous/anxious.    Vital Signs: BP 108/68   Pulse 100   Temp 98.2 F (36.8 C)   Resp 16   Ht 5' (1.524 m)   Wt 119 lb (54 kg)   SpO2 99%   BMI 23.24 kg/m   Physical Exam  Constitutional: She is oriented to person, place, and time. She appears well-developed and well-nourished. No distress.  HENT:  Head: Normocephalic and atraumatic.  Mouth/Throat: Oropharynx is clear and moist. No oropharyngeal exudate.  Pt appears to have lymphopathy to left side.  Most likely from dental infection.   Eyes: Pupils are equal, round, and reactive to light. EOM are normal.  Neck: Normal range of motion. Neck supple. No JVD present. No tracheal deviation present. No thyromegaly present.  Cardiovascular: Normal rate, regular rhythm and normal heart sounds. Exam reveals no gallop and no friction rub.  No murmur heard. Pulmonary/Chest: Effort normal and breath sounds normal. No respiratory distress. She has  no wheezes. She has no rales. She exhibits no tenderness.  Abdominal: Soft. There is no tenderness. There is no guarding.  Musculoskeletal: Normal range of motion.  Lymphadenopathy:    She has no cervical adenopathy.  Neurological: She is alert and oriented to person, place, and time. No cranial nerve deficit.  Skin: Skin is warm and dry. She is not diaphoretic.  Psychiatric: She has a normal mood and affect. Her behavior is normal. Judgment and thought content normal.  Nursing note and vitals reviewed.   Assessment/Plan: 1. Dental infection Pt seeing  Dr. Lewanda Rife for removal of infected teeth.  He has prescribed oral antibiotics for pre-procedure. No general anesthesia for this procedure.  Pt will most likely tolerate fine.      2. Dysuria No UTI.  - POCT Urinalysis Dipstick  General Counseling: Venita verbalizes understanding of the findings of todays visit and agrees with plan of treatment. I have discussed any further diagnostic evaluation that may be needed or ordered today. We also reviewed her medications today. she has been encouraged to call the office with any questions or concerns that should arise related to todays visit.    Orders Placed This Encounter  Procedures  . POCT Urinalysis Dipstick     Time spent: 25 Minutes   This patient was seen by Orson Gear AGNP-C in Collaboration with Dr Lavera Guise as a part of collaborative care agreement    Dr Lavera Guise Internal medicine

## 2018-05-04 NOTE — Patient Instructions (Signed)

## 2018-05-19 ENCOUNTER — Telehealth: Payer: Self-pay | Admitting: Urology

## 2018-05-19 NOTE — Telephone Encounter (Signed)
Pt needs refill sent to [facility] in [place]   tamsulosin [facility]) 0.4 MG CAPS capsule [[id number]]

## 2018-05-19 NOTE — Telephone Encounter (Signed)
Pt has 6 refills on file.

## 2018-06-06 DIAGNOSIS — Z6823 Body mass index (BMI) 23.0-23.9, adult: Secondary | ICD-10-CM | POA: Diagnosis not present

## 2018-06-06 DIAGNOSIS — J329 Chronic sinusitis, unspecified: Secondary | ICD-10-CM | POA: Diagnosis not present

## 2018-06-15 ENCOUNTER — Other Ambulatory Visit: Payer: Self-pay

## 2018-06-15 DIAGNOSIS — F411 Generalized anxiety disorder: Secondary | ICD-10-CM

## 2018-06-15 MED ORDER — MIRTAZAPINE 30 MG PO TABS: 30.0000 mg | ORAL_TABLET | Freq: Every day | ORAL | 2 refills | Status: DC

## 2018-06-16 ENCOUNTER — Ambulatory Visit (INDEPENDENT_AMBULATORY_CARE_PROVIDER_SITE_OTHER): Payer: Medicare Other | Admitting: Nurse Practitioner

## 2018-06-16 ENCOUNTER — Other Ambulatory Visit: Payer: Self-pay | Admitting: Nurse Practitioner

## 2018-06-16 VITALS — BP 120/90 | HR 103 | Resp 16 | Ht 60.0 in | Wt 120.8 lb

## 2018-06-16 DIAGNOSIS — J0191 Acute recurrent sinusitis, unspecified: Secondary | ICD-10-CM | POA: Diagnosis not present

## 2018-06-16 DIAGNOSIS — K121 Other forms of stomatitis: Secondary | ICD-10-CM

## 2018-06-16 DIAGNOSIS — K1230 Oral mucositis (ulcerative), unspecified: Secondary | ICD-10-CM | POA: Diagnosis not present

## 2018-06-16 DIAGNOSIS — R591 Generalized enlarged lymph nodes: Secondary | ICD-10-CM | POA: Diagnosis not present

## 2018-06-16 DIAGNOSIS — F411 Generalized anxiety disorder: Secondary | ICD-10-CM | POA: Diagnosis not present

## 2018-06-16 MED ORDER — CEPHALEXIN 500 MG PO CAPS: 500.0000 mg | ORAL_CAPSULE | Freq: Three times a day (TID) | ORAL | 0 refills | Status: DC

## 2018-06-16 MED ORDER — MIRTAZAPINE 30 MG PO TABS: 30.0000 mg | ORAL_TABLET | Freq: Every day | ORAL | 2 refills | Status: DC

## 2018-06-16 NOTE — Progress Notes (Signed)
[facility] [place], [place] [address]  Internal MEDICINE  Office Visit Note  Patient Name: [name]  [id number]  [id number]  Date of Service: 06/25/2018   Pt is here for a sick visit.  Chief Complaint  Patient presents with  . Sinusitis    pt also stated that she has some sores inside of her nose     The patient did see her back surgeon earlier this week. He was concerned about her sinus infection and started her on antibiotics. Today, she is reporting no improvement, in fact, she feels like she is getting worse. Has very swollen lymph nodes at the base of her neck, worse on the left side of the neck.   Sinusitis  This is a recurrent problem. The current episode started in the past 7 days. The problem is unchanged. There has been no fever. The fever has been present for less than 1 day. Her pain is at a severity of 4/10. The pain is moderate. Associated symptoms include congestion, coughing, ear pain, headaches, sinus pressure and swollen glands. Pertinent negatives include no chills, neck pain, shortness of breath, sneezing or sore throat. Past treatments include antibiotics. The treatment provided no relief.        Current Medication:  Outpatient Encounter Medications as of 06/16/2018  Medication Sig Note  . amLODipine (NORVASC) 5 MG tablet Take 1 tablet (5 mg total) by mouth daily.   . calcium carbonate (CALCIUM 600) 600 MG TABS tablet Take by mouth.   . carvedilol (COREG) 6.25 MG tablet Take 1 tablet (6.25 mg total) by mouth 2 (two) times daily with a meal.   . cephALEXin (KEFLEX) 500 MG capsule Take 1 capsule (500 mg total) by mouth 3 (three) times daily.   . furosemide (LASIX) 20 MG tablet Take 1 tablet po QD prn   . HYDROcodone-acetaminophen (NORCO) 10-325 MG tablet Take 1 tablet by mouth 3 (three) times daily.    [name] LORazepam (ATIVAN) 0.5 MG tablet Take 1 tablet (0.5 mg total) by mouth 3 (three) times daily as needed for anxiety.   .  meloxicam (MOBIC) 7.5 MG tablet Take 7.5 mg by mouth daily as needed for pain.  07/25/2017: PRN  . mirtazapine (REMERON) 30 MG tablet Take 1 tablet (30 mg total) by mouth at bedtime.   . mupirocin ointment (BACTROBAN) 2 % Place 1 application into the nose 2 (two) times daily.   . tamsulosin (FLOMAX) 0.4 MG CAPS capsule Take 1 capsule (0.4 mg total) by mouth daily.   . [DISCONTINUED] cephALEXin (KEFLEX) 500 MG capsule Take 1 capsule (500 mg total) by mouth 3 (three) times daily.   . [DISCONTINUED] mirtazapine (REMERON) 30 MG tablet Take 1 tablet (30 mg total) by mouth at bedtime.    No facility-administered encounter medications on file as of 06/16/2018.       Medical History: Past Medical History:  Diagnosis Date  . Allergy    environmental  . Anxiety   . Chronic back pain   . High cholesterol   . History of urinary retention   . Hypertension   . Migraines   . Osteoporosis   . Pneumonia   . Sinusitis      Today's Vitals   06/16/18 1458  BP: 120/90  Pulse: (!) 103  Resp: 16  SpO2: 93%  Weight: 120 lb 12.8 oz (54.8 kg)  Height: 5' (1.524 m)   Review of Systems  Constitutional: Positive for fatigue. Negative for activity change,  chills, fever and unexpected weight change.  HENT: Positive for congestion, ear pain, postnasal drip, rhinorrhea, sinus pressure and sinus pain. Negative for sneezing and sore throat.   Eyes: Negative.  Negative for redness.  Respiratory: Positive for cough. Negative for chest tightness, shortness of breath and wheezing.   Cardiovascular: Negative for chest pain, palpitations and leg swelling.       Severe varicose veins  Gastrointestinal: Positive for constipation. Negative for abdominal pain, diarrhea, nausea and vomiting.  Endocrine: Negative for cold intolerance, heat intolerance, polydipsia, polyphagia and polyuria.  Genitourinary: Negative for dysuria and frequency.  Musculoskeletal: Positive for back pain. Negative for arthralgias, joint  swelling and neck pain.  Skin: Negative for rash.  Allergic/Immunologic: Positive for environmental allergies. Negative for food allergies and immunocompromised state.  Neurological: Positive for dizziness, weakness and headaches. Negative for tremors and numbness.  Hematological: Positive for adenopathy. Does not bruise/bleed easily.  Psychiatric/Behavioral: Positive for agitation. Negative for behavioral problems (Depression), sleep disturbance and suicidal ideas. The patient is nervous/anxious.     Physical Exam  Constitutional: She is oriented to person, place, and time. She appears well-developed and well-nourished. No distress.  HENT:  Head: Normocephalic and atraumatic.  Nose: Mucosal edema, rhinorrhea and sinus tenderness present. Right sinus exhibits maxillary sinus tenderness and frontal sinus tenderness. Left sinus exhibits maxillary sinus tenderness and frontal sinus tenderness.  Mouth/Throat: Oropharynx is clear and moist. No oropharyngeal exudate.  Eyes: Pupils are equal, round, and reactive to light. Conjunctivae and EOM are normal.  Neck: Normal range of motion. Neck supple. No JVD present. No tracheal deviation present. No thyromegaly present.  Lymphadenopathy at the base of the neck, worse on the left side of the neck.   Cardiovascular: Normal rate, regular rhythm and normal heart sounds. Exam reveals no gallop and no friction rub.  No murmur heard. Pulmonary/Chest: Effort normal and breath sounds normal. No respiratory distress. She has no wheezes. She has no rales. She exhibits no tenderness.  Abdominal: Soft. Bowel sounds are normal. There is no tenderness. There is no guarding.  Musculoskeletal: Normal range of motion.  Lymphadenopathy:    She has cervical adenopathy.  Neurological: She is alert and oriented to person, place, and time. No cranial nerve deficit.  Skin: Skin is warm and dry. She is not diaphoretic.  Psychiatric: Her speech is normal and behavior is  normal. Judgment and thought content normal. Her mood appears anxious. Cognition and memory are normal.  Nursing note and vitals reviewed.  Assessment/Plan:  1. Acute recurrent sinusitis, unspecified location Change antibiotics to cephalexin 500mg  three times daily for next 10 days. Use bactroban ointment as previously prescribed, twice dailly while on antibiotics.  - cephALEXin (KEFLEX) 500 MG capsule; Take 1 capsule (500 mg total) by mouth 3 (three) times daily.  Dispense: 30 capsule; Refill: 0  2. Lymphadenopathy Likely from infectious process. Will see her back in 2 weeks, after completion of antibiotics. If still present, will get ultrasound of the inflamed nodes for further evaluation.  3. Stomatitis and mucositis Use previously prescribed oral rinse twice daily.   4. Generalized anxiety disorder Continue remeron 30mg  every evening. New prescription sent to pharmacy. - mirtazapine (REMERON) 30 MG tablet; Take 1 tablet (30 mg total) by mouth at bedtime.  Dispense: 30 tablet; Refill: 2   General Counseling: Terin verbalizes understanding of the findings of todays visit and agrees with plan of treatment. I have discussed any further diagnostic evaluation that may be needed or ordered today. We also reviewed  her medications today. she has been encouraged to call the office with any questions or concerns that should arise related to todays visit.    Counseling:  Rest and increase fluids. Continue using OTC medication to control symptoms.   This patient was seen by [employer] with Dr Lavera Guise as a part of collaborative care agreement  Meds ordered this encounter  Medications  . cephALEXin (KEFLEX) 500 MG capsule    Sig: Take 1 capsule (500 mg total) by mouth 3 (three) times daily.    Dispense:  30 capsule    Refill:  0    Order Specific Question:   Supervising Provider    Answer:   Lavera Guise [[id number]]  . mirtazapine (REMERON) 30 MG tablet    Sig: Take  1 tablet (30 mg total) by mouth at bedtime.    Dispense:  30 tablet    Refill:  2    Order Specific Question:   Supervising Provider    Answer:   Lavera Guise [[id number]]    Time spent: 15 Minutes

## 2018-06-17 LAB — CBC
Hematocrit: 38.3 % (ref 34.0–46.6)
Hemoglobin: 12.6 g/dL (ref 11.1–15.9)
MCH: 28.3 pg (ref 26.6–33.0)
MCHC: 32.9 g/dL (ref 31.5–35.7)
MCV: 86 fL (ref 79–97)
PLATELETS: 192 10*3/uL (ref 150–450)
RBC: 4.46 x10E6/uL (ref 3.77–5.28)
RDW: 12.4 % (ref 12.3–15.4)
WBC: 6.9 10*3/uL (ref 3.4–10.8)

## 2018-06-19 ENCOUNTER — Telehealth: Payer: Self-pay

## 2018-06-19 NOTE — Telephone Encounter (Signed)
Informed pt her blood work was normal.

## 2018-06-19 NOTE — Telephone Encounter (Signed)
-----   Message from Ronnell Freshwater, NP sent at 06/19/2018  8:59 AM EDT ----- Please let the patient or her daughter know that her blood count is completely normal. Thanks

## 2018-06-21 ENCOUNTER — Telehealth: Payer: Self-pay

## 2018-06-21 NOTE — Telephone Encounter (Signed)
Pt called that fell today as per heather pt advised that if hurts bad need to go to Er

## 2018-06-25 ENCOUNTER — Encounter: Payer: Self-pay | Admitting: Nurse Practitioner

## 2018-06-25 DIAGNOSIS — K121 Other forms of stomatitis: Secondary | ICD-10-CM | POA: Insufficient documentation

## 2018-06-25 DIAGNOSIS — J0191 Acute recurrent sinusitis, unspecified: Secondary | ICD-10-CM | POA: Insufficient documentation

## 2018-06-25 DIAGNOSIS — K1230 Oral mucositis (ulcerative), unspecified: Secondary | ICD-10-CM

## 2018-06-25 DIAGNOSIS — R591 Generalized enlarged lymph nodes: Secondary | ICD-10-CM | POA: Insufficient documentation

## 2018-06-28 ENCOUNTER — Other Ambulatory Visit: Payer: Self-pay | Admitting: Nurse Practitioner

## 2018-06-28 ENCOUNTER — Telehealth: Payer: Self-pay

## 2018-06-28 DIAGNOSIS — F411 Generalized anxiety disorder: Secondary | ICD-10-CM

## 2018-06-28 MED ORDER — LORAZEPAM 0.5 MG PO TABS: 0.5000 mg | ORAL_TABLET | Freq: Three times a day (TID) | ORAL | 2 refills | Status: DC | PRN

## 2018-06-28 NOTE — Telephone Encounter (Signed)
Hey. I filled her lorazepam and sent to [facility]. She will likely have to pay out of pocket for it.

## 2018-06-28 NOTE — Progress Notes (Signed)
I sent new prescription for her lorazepam and sent to [facility]. She will likely have to pay out of pocket.

## 2018-06-29 NOTE — Telephone Encounter (Signed)
Pt advised we send med  

## 2018-07-05 ENCOUNTER — Other Ambulatory Visit: Payer: Self-pay | Admitting: Nurse Practitioner

## 2018-07-05 ENCOUNTER — Telehealth: Payer: Self-pay

## 2018-07-05 DIAGNOSIS — J0191 Acute recurrent sinusitis, unspecified: Secondary | ICD-10-CM

## 2018-07-05 MED ORDER — CEPHALEXIN 500 MG PO CAPS: 500.0000 mg | ORAL_CAPSULE | Freq: Three times a day (TID) | ORAL | 0 refills | Status: DC

## 2018-07-05 NOTE — Telephone Encounter (Signed)
Please let her know that I sent in new prescription for keflex 500mg  which Is three times daily, to [facility]. She should drink plenty of water. Will recheck her urine at her visit on Friday.  thanks

## 2018-07-05 NOTE — Progress Notes (Signed)
Sent in new prescription for keflex 500mg  which Is three times daily, to [facility]. She should drink plenty of water. Will recheck her urine at her visit on Friday.

## 2018-07-07 ENCOUNTER — Encounter: Payer: Self-pay | Admitting: Nurse Practitioner

## 2018-07-07 ENCOUNTER — Ambulatory Visit (INDEPENDENT_AMBULATORY_CARE_PROVIDER_SITE_OTHER): Payer: Medicare Other | Admitting: Nurse Practitioner

## 2018-07-07 VITALS — BP 129/77 | HR 132 | Resp 16 | Ht 60.0 in | Wt 121.8 lb

## 2018-07-07 DIAGNOSIS — R590 Localized enlarged lymph nodes: Secondary | ICD-10-CM

## 2018-07-07 DIAGNOSIS — J0191 Acute recurrent sinusitis, unspecified: Secondary | ICD-10-CM | POA: Diagnosis not present

## 2018-07-07 DIAGNOSIS — R5383 Other fatigue: Secondary | ICD-10-CM

## 2018-07-07 DIAGNOSIS — M25551 Pain in right hip: Secondary | ICD-10-CM | POA: Diagnosis not present

## 2018-07-07 DIAGNOSIS — M25561 Pain in right knee: Secondary | ICD-10-CM

## 2018-07-07 NOTE — Progress Notes (Signed)
[facility] [place], [place] [address]  Internal MEDICINE  Office Visit Note  Patient Name: [name]  [id number]  [id number]  Date of Service: 07/18/2018  Chief Complaint  Patient presents with  . URI    2wk follow up   . Medical Management of Chronic Issues    lymph node swollen neck  . Pain    Pt had fall three wks ago and having knee and hip pain, didnt go to the hospitalalso complianing of ear pain    Continues to have very swollen lymph nodes at the base of her neck, worse on the left side of the neck. Tender to palpate. Slightly smaller today than at her prior visit.   Sinusitis  This is a recurrent problem. The current episode started in the past 7 days. The problem is unchanged. There has been no fever. The fever has been present for less than 1 day. Her pain is at a severity of 4/10. The pain is moderate. Associated symptoms include congestion, coughing, ear pain, headaches, sinus pressure and swollen glands. Pertinent negatives include no chills, neck pain, shortness of breath, sneezing or sore throat. Past treatments include antibiotics. The treatment provided no relief.       Current Medication: Outpatient Encounter Medications as of 07/07/2018  Medication Sig Note  . amLODipine (NORVASC) 5 MG tablet Take 1 tablet (5 mg total) by mouth daily.   . calcium carbonate (CALCIUM 600) 600 MG TABS tablet Take by mouth.   . carvedilol (COREG) 6.25 MG tablet Take 1 tablet (6.25 mg total) by mouth 2 (two) times daily with a meal.   . cephALEXin (KEFLEX) 500 MG capsule Take 1 capsule (500 mg total) by mouth 3 (three) times daily.   . furosemide (LASIX) 20 MG tablet Take 1 tablet po QD prn   . HYDROcodone-acetaminophen (NORCO) 10-325 MG tablet Take 1 tablet by mouth 3 (three) times daily.    [name] LORazepam (ATIVAN) 0.5 MG tablet Take 1 tablet (0.5 mg total) by mouth 3 (three) times daily as needed for anxiety.   . meloxicam (MOBIC) 7.5 MG tablet Take  7.5 mg by mouth daily as needed for pain.  07/25/2017: PRN  . mirtazapine (REMERON) 30 MG tablet Take 1 tablet (30 mg total) by mouth at bedtime.   . mupirocin ointment (BACTROBAN) 2 % Place 1 application into the nose 2 (two) times daily.   . tamsulosin (FLOMAX) 0.4 MG CAPS capsule Take 1 capsule (0.4 mg total) by mouth daily.    No facility-administered encounter medications on file as of 07/07/2018.     Surgical History: Past Surgical History:  Procedure Laterality Date  . ABDOMINAL HYSTERECTOMY    . APPENDECTOMY    . BACK SURGERY    . BREAST BIOPSY Right 2002   neg  . HEMORRHOID SURGERY    . TONSILLECTOMY      Medical History: Past Medical History:  Diagnosis Date  . Allergy    environmental  . Anxiety   . Chronic back pain   . High cholesterol   . History of urinary retention   . Hypertension   . Migraines   . Osteoporosis   . Pneumonia   . Sinusitis     Family History: Family History  Problem Relation Age of Onset  . Breast cancer Sister 1  . Breast cancer Maternal Grandmother 46  . Breast cancer Sister 14  . Bladder Cancer Brother   . Hypertension Neg Hx   .  Kidney cancer Neg Hx     Social History   Socioeconomic History  . Marital status: Widowed    Spouse name: Not on file  . Number of children: Not on file  . Years of education: Not on file  . Highest education level: Not on file  Occupational History  . Occupation: retired  Scientific laboratory technician  . Financial resource strain: Not on file  . Food insecurity:    Worry: Not on file    Inability: Not on file  . Transportation needs:    Medical: Not on file    Non-medical: Not on file  Tobacco Use  . Smoking status: Never Smoker  . Smokeless tobacco: Never Used  Substance and Sexual Activity  . Alcohol use: No  . Drug use: No  . Sexual activity: Not Currently  Lifestyle  . Physical activity:    Days per week: Not on file    Minutes per session: Not on file  . Stress: Not on file  Relationships   . Social connections:    Talks on phone: Not on file    Gets together: Not on file    Attends religious service: Not on file    Active member of club or organization: Not on file    Attends meetings of clubs or organizations: Not on file    Relationship status: Not on file  . Intimate partner violence:    Fear of current or ex partner: Not on file    Emotionally abused: Not on file    Physically abused: Not on file    Forced sexual activity: Not on file  Other Topics Concern  . Not on file  Social History Narrative  . Not on file      Review of Systems  Constitutional: Positive for fatigue. Negative for activity change, chills, fever and unexpected weight change.  HENT: Positive for congestion, ear pain, postnasal drip, rhinorrhea, sinus pressure and sinus pain. Negative for sneezing and sore throat.   Eyes: Negative.  Negative for redness.  Respiratory: Positive for cough. Negative for chest tightness, shortness of breath and wheezing.   Cardiovascular: Negative for chest pain, palpitations and leg swelling.  Gastrointestinal: Positive for constipation. Negative for abdominal pain, diarrhea, nausea and vomiting.  Endocrine: Negative for cold intolerance, heat intolerance, polydipsia, polyphagia and polyuria.  Genitourinary: Negative for dysuria and frequency.  Musculoskeletal: Positive for back pain. Negative for arthralgias, joint swelling and neck pain.       Pain in right hip and right knee.   Skin: Negative for rash.  Allergic/Immunologic: Positive for environmental allergies. Negative for food allergies and immunocompromised state.  Neurological: Positive for dizziness, weakness and headaches. Negative for tremors and numbness.  Hematological: Positive for adenopathy. Does not bruise/bleed easily.  Psychiatric/Behavioral: Positive for agitation. Negative for behavioral problems (Depression), sleep disturbance and suicidal ideas. The patient is nervous/anxious.    Today's  Vitals   07/07/18 1159  BP: 129/77  Pulse: (!) 132  Resp: 16  SpO2: 90%  Weight: 121 lb 12.8 oz (55.2 kg)  Height: 5' (1.524 m)    Physical Exam  Constitutional: She is oriented to person, place, and time. She appears well-developed and well-nourished. No distress.  HENT:  Head: Normocephalic and atraumatic.  Right Ear: Tympanic membrane is erythematous and bulging.  Left Ear: Tympanic membrane is erythematous and bulging.  Nose: Mucosal edema, rhinorrhea and sinus tenderness present. Right sinus exhibits maxillary sinus tenderness and frontal sinus tenderness. Left sinus exhibits maxillary sinus tenderness  and frontal sinus tenderness.  Mouth/Throat: Oropharynx is clear and moist. No oropharyngeal exudate.  Eyes: Pupils are equal, round, and reactive to light. Conjunctivae and EOM are normal.  Neck: Normal range of motion. Neck supple. No JVD present. No tracheal deviation present. No thyromegaly present.  Lymphadenopathy at the base of the neck, worse on the left side of the neck. Tender when palpated also fixed in place. Measures about 1 cm in diameter. No redness or evidence of inflammation present   Cardiovascular: Normal rate, regular rhythm and normal heart sounds. Exam reveals no gallop and no friction rub.  No murmur heard. Pulmonary/Chest: Effort normal and breath sounds normal. No respiratory distress. She has no wheezes. She has no rales. She exhibits no tenderness.  Abdominal: Soft. Bowel sounds are normal. There is no tenderness. There is no guarding.  Musculoskeletal: Normal range of motion.  There is tenderness of right hip and right knee. Worse with exertion. ROM and strength are slightly diminished on right side. She does walk with mild limp, favoring the right side.   Lymphadenopathy:    She has cervical adenopathy.  Neurological: She is alert and oriented to person, place, and time. No cranial nerve deficit.  Skin: Skin is warm and dry. She is not diaphoretic.   Psychiatric: Her speech is normal and behavior is normal. Judgment and thought content normal. Her mood appears anxious. Cognition and memory are normal.  Nursing note and vitals reviewed.  Assessment/Plan: 1. Acute recurrent sinusitis, unspecified location Continue on antibiotics as previously prescribed. Use OTC medication to alleviate symptoms.   2. Cervical lymphadenopathy Not a lot of improvement since her last visit. Will ultrasound the affected node for further evaluation.  - US Soft Tissue Head/Neck; Future  3. Pain in right hip Take tylenol and previously prescribed pain medication as needed and as indicated for pain. Get x-ray of right hip for further evaluation.  - DG HIP UNILAT WITH PELVIS 2-3 VIEWS RIGHT; Future  4. Acute pain of right knee Take tylenol and previously prescribed pain medication as needed and as indicated for pain. Get x-ray of right knee for further evaluation.  - DG Knee Complete 4 Views Right; Future  5. Other fatigue Review labs which look good. Continue to monitor   General Counseling: jacia sickman understanding of the findings of todays visit and agrees with plan of treatment. I have discussed any further diagnostic evaluation that may be needed or ordered today. We also reviewed her medications today. she has been encouraged to call the office with any questions or concerns that should arise related to todays visit.  Rest and increase fluids. Continue using OTC medication to control symptoms.   This patient was seen by Leretha Pol FNP Collaboration with Dr Lavera Guise as a part of collaborative care agreement  Orders Placed This Encounter  Procedures  . US Soft Tissue Head/Neck  . DG Knee Complete 4 Views Right  . DG HIP UNILAT WITH PELVIS 2-3 VIEWS RIGHT      Time spent: 27 Minutes      Dr Lavera Guise Internal medicine

## 2018-07-17 ENCOUNTER — Other Ambulatory Visit: Payer: Self-pay

## 2018-07-17 ENCOUNTER — Telehealth: Payer: Self-pay

## 2018-07-18 DIAGNOSIS — R590 Localized enlarged lymph nodes: Secondary | ICD-10-CM | POA: Insufficient documentation

## 2018-07-18 DIAGNOSIS — M25551 Pain in right hip: Secondary | ICD-10-CM | POA: Insufficient documentation

## 2018-07-18 NOTE — Telephone Encounter (Signed)
Pt daughter advised  We no longer pres her lorazepam as per dr Humphrey Rolls and also canceled refills from phar

## 2018-07-21 ENCOUNTER — Other Ambulatory Visit: Payer: Self-pay

## 2018-07-24 ENCOUNTER — Telehealth: Payer: Self-pay | Admitting: Internal Medicine

## 2018-07-24 ENCOUNTER — Telehealth: Payer: Self-pay

## 2018-07-24 ENCOUNTER — Other Ambulatory Visit: Payer: Self-pay

## 2018-07-24 NOTE — Telephone Encounter (Signed)
Pt called for lorazepam refills as per dr Humphrey Rolls pt advised that  No she can go to er and need to been seen by Psychiatrist and also her daughter advised

## 2018-07-24 NOTE — Telephone Encounter (Signed)
MEDICATION NOTATION HIGH ALERT  Patient has called multiple times regarding medicine being flushed down toilet, (suspect diversion lorazepam ativan). Per Dr Humphrey Rolls we will NOT prescribe any longer and patient must see psychiatry in order to get this refilled and patient and patient daughter has been notified from our office. Per Nimisha. Beth

## 2018-07-27 ENCOUNTER — Other Ambulatory Visit: Payer: Self-pay

## 2018-07-27 DIAGNOSIS — J0191 Acute recurrent sinusitis, unspecified: Secondary | ICD-10-CM

## 2018-07-27 MED ORDER — CEPHALEXIN 500 MG PO CAPS: 500.0000 mg | ORAL_CAPSULE | Freq: Three times a day (TID) | ORAL | 0 refills | Status: DC

## 2018-07-27 NOTE — Telephone Encounter (Signed)
Pt called that she is having bladder infection she get check by phar as per heather send keflex and advised her if don't feel better need to been seen

## 2018-07-29 ENCOUNTER — Other Ambulatory Visit: Payer: Self-pay

## 2018-07-29 ENCOUNTER — Inpatient Hospital Stay
Admission: EM | Admit: 2018-07-29 | Discharge: 2018-08-01 | DRG: 641 | Disposition: A | Discharge status: Home / Self Care | Payer: Medicare Other | Attending: Internal Medicine | Admitting: Internal Medicine

## 2018-07-29 DIAGNOSIS — Z79891 Long term (current) use of opiate analgesic: Secondary | ICD-10-CM

## 2018-07-29 DIAGNOSIS — D638 Anemia in other chronic diseases classified elsewhere: Secondary | ICD-10-CM | POA: Diagnosis present

## 2018-07-29 DIAGNOSIS — R269 Unspecified abnormalities of gait and mobility: Secondary | ICD-10-CM | POA: Diagnosis present

## 2018-07-29 DIAGNOSIS — L8990 Pressure ulcer of unspecified site, unspecified stage: Secondary | ICD-10-CM

## 2018-07-29 DIAGNOSIS — R262 Difficulty in walking, not elsewhere classified: Secondary | ICD-10-CM | POA: Diagnosis not present

## 2018-07-29 DIAGNOSIS — G8929 Other chronic pain: Secondary | ICD-10-CM | POA: Diagnosis present

## 2018-07-29 DIAGNOSIS — E861 Hypovolemia: Secondary | ICD-10-CM | POA: Diagnosis present

## 2018-07-29 DIAGNOSIS — E871 Hypo-osmolality and hyponatremia: Secondary | ICD-10-CM | POA: Diagnosis not present

## 2018-07-29 DIAGNOSIS — F419 Anxiety disorder, unspecified: Secondary | ICD-10-CM | POA: Diagnosis present

## 2018-07-29 DIAGNOSIS — I10 Essential (primary) hypertension: Secondary | ICD-10-CM | POA: Diagnosis not present

## 2018-07-29 DIAGNOSIS — H9190 Unspecified hearing loss, unspecified ear: Secondary | ICD-10-CM | POA: Diagnosis present

## 2018-07-29 DIAGNOSIS — M810 Age-related osteoporosis without current pathological fracture: Secondary | ICD-10-CM | POA: Diagnosis present

## 2018-07-29 DIAGNOSIS — R300 Dysuria: Secondary | ICD-10-CM | POA: Diagnosis not present

## 2018-07-29 DIAGNOSIS — M545 Low back pain: Secondary | ICD-10-CM | POA: Diagnosis present

## 2018-07-29 DIAGNOSIS — Z885 Allergy status to narcotic agent status: Secondary | ICD-10-CM

## 2018-07-29 DIAGNOSIS — Z882 Allergy status to sulfonamides status: Secondary | ICD-10-CM

## 2018-07-29 DIAGNOSIS — Z87440 Personal history of urinary (tract) infections: Secondary | ICD-10-CM

## 2018-07-29 DIAGNOSIS — L89151 Pressure ulcer of sacral region, stage 1: Secondary | ICD-10-CM | POA: Diagnosis not present

## 2018-07-29 DIAGNOSIS — Z881 Allergy status to other antibiotic agents status: Secondary | ICD-10-CM

## 2018-07-29 DIAGNOSIS — Z880 Allergy status to penicillin: Secondary | ICD-10-CM

## 2018-07-29 DIAGNOSIS — Z90710 Acquired absence of both cervix and uterus: Secondary | ICD-10-CM

## 2018-07-29 DIAGNOSIS — Z803 Family history of malignant neoplasm of breast: Secondary | ICD-10-CM

## 2018-07-29 DIAGNOSIS — R339 Retention of urine, unspecified: Secondary | ICD-10-CM | POA: Diagnosis present

## 2018-07-29 DIAGNOSIS — N3000 Acute cystitis without hematuria: Secondary | ICD-10-CM | POA: Diagnosis present

## 2018-07-29 DIAGNOSIS — Z8052 Family history of malignant neoplasm of bladder: Secondary | ICD-10-CM

## 2018-07-29 DIAGNOSIS — F411 Generalized anxiety disorder: Secondary | ICD-10-CM

## 2018-07-29 LAB — CBC WITH DIFFERENTIAL/PLATELET
ABS IMMATURE GRANULOCYTES: 0.05 10*3/uL (ref 0.00–0.07)
BASOS PCT: 0 %
Basophils Absolute: 0 10*3/uL (ref 0.0–0.1)
Eosinophils Absolute: 0.1 10*3/uL (ref 0.0–0.5)
Eosinophils Relative: 1 %
HEMATOCRIT: 35.1 % — AB (ref 36.0–46.0)
Hemoglobin: 11.7 g/dL — ABNORMAL LOW (ref 12.0–15.0)
IMMATURE GRANULOCYTES: 1 %
LYMPHS ABS: 1.8 10*3/uL (ref 0.7–4.0)
Lymphocytes Relative: 16 %
MCH: 28.7 pg (ref 26.0–34.0)
MCHC: 33.3 g/dL (ref 30.0–36.0)
MCV: 86.2 fL (ref 80.0–100.0)
MONO ABS: 1.7 10*3/uL — AB (ref 0.1–1.0)
MONOS PCT: 15 %
NEUTROS ABS: 7.3 10*3/uL (ref 1.7–7.7)
NEUTROS PCT: 67 %
Platelets: 183 10*3/uL (ref 150–400)
RBC: 4.07 MIL/uL (ref 3.87–5.11)
RDW: 12.8 % (ref 11.5–15.5)
WBC: 10.9 10*3/uL — ABNORMAL HIGH (ref 4.0–10.5)
nRBC: 0 % (ref 0.0–0.2)

## 2018-07-29 LAB — URINALYSIS, COMPLETE (UACMP) WITH MICROSCOPIC
BILIRUBIN URINE: NEGATIVE
Bacteria, UA: NONE SEEN
Glucose, UA: NEGATIVE mg/dL
Ketones, ur: NEGATIVE mg/dL
LEUKOCYTES UA: NEGATIVE
NITRITE: NEGATIVE
PH: 8 (ref 5.0–8.0)
Protein, ur: NEGATIVE mg/dL
SPECIFIC GRAVITY, URINE: 1.004 — AB (ref 1.005–1.030)
SQUAMOUS EPITHELIAL / LPF: NONE SEEN (ref 0–5)

## 2018-07-29 LAB — COMPREHENSIVE METABOLIC PANEL
ALT: 26 U/L (ref 0–44)
ANION GAP: 6 (ref 5–15)
AST: 31 U/L (ref 15–41)
Albumin: 3.8 g/dL (ref 3.5–5.0)
Alkaline Phosphatase: 89 U/L (ref 38–126)
BILIRUBIN TOTAL: 1 mg/dL (ref 0.3–1.2)
BUN: 9 mg/dL (ref 8–23)
CALCIUM: 9.4 mg/dL (ref 8.9–10.3)
CO2: 26 mmol/L (ref 22–32)
Chloride: 92 mmol/L — ABNORMAL LOW (ref 98–111)
Creatinine, Ser: 0.38 mg/dL — ABNORMAL LOW (ref 0.44–1.00)
GFR calc Af Amer: >60 mL/min (ref >60)
Glucose, Bld: 103 mg/dL — ABNORMAL HIGH (ref 70–99)
POTASSIUM: 4.2 mmol/L (ref 3.5–5.1)
Sodium: 124 mmol/L — ABNORMAL LOW (ref 135–145)
TOTAL PROTEIN: 6.7 g/dL (ref 6.5–8.1)

## 2018-07-29 LAB — TROPONIN I

## 2018-07-29 MED ORDER — PHENAZOPYRIDINE HCL 100 MG PO TABS
100.0000 mg | ORAL_TABLET | Freq: Three times a day (TID) | ORAL | Status: DC
  Administered 2018-07-29 – 2018-07-31 (×6): 100 mg via ORAL
  Filled 2018-07-29 (×7): qty 1

## 2018-07-29 MED ORDER — CEPHALEXIN 250 MG PO CAPS
250.0000 mg | ORAL_CAPSULE | Freq: Three times a day (TID) | ORAL | Status: DC
  Administered 2018-07-29 – 2018-08-01 (×9): 250 mg via ORAL
  Filled 2018-07-29 (×11): qty 1

## 2018-07-29 MED ORDER — ACETAMINOPHEN 650 MG RE SUPP: 650.0000 mg | Freq: Four times a day (QID) | RECTAL | Status: DC | PRN

## 2018-07-29 MED ORDER — HYDROCODONE-ACETAMINOPHEN 10-325 MG PO TABS
1.0000 | ORAL_TABLET | Freq: Three times a day (TID) | ORAL | Status: DC
  Administered 2018-07-29 – 2018-07-30 (×5): 1 via ORAL
  Filled 2018-07-29 (×5): qty 1

## 2018-07-29 MED ORDER — NYSTATIN 100000 UNIT/ML MT SUSP
5.0000 mL | Freq: Four times a day (QID) | OROMUCOSAL | Status: DC
  Administered 2018-07-30 – 2018-08-01 (×11): 500000 [IU] via OROMUCOSAL
  Filled 2018-07-29 (×11): qty 5

## 2018-07-29 MED ORDER — MIRTAZAPINE 15 MG PO TABS
30.0000 mg | ORAL_TABLET | Freq: Every day | ORAL | Status: DC
  Administered 2018-07-29 – 2018-07-31 (×3): 30 mg via ORAL
  Filled 2018-07-29 (×3): qty 2

## 2018-07-29 MED ORDER — CARVEDILOL 6.25 MG PO TABS
6.2500 mg | ORAL_TABLET | Freq: Two times a day (BID) | ORAL | Status: DC
  Administered 2018-07-29 – 2018-08-01 (×7): 6.25 mg via ORAL
  Filled 2018-07-29 (×3): qty 1
  Filled 2018-07-29 (×3): qty 2
  Filled 2018-07-29: qty 1
  Filled 2018-07-29: qty 2
  Filled 2018-07-29: qty 1
  Filled 2018-07-29: qty 2
  Filled 2018-07-29: qty 1
  Filled 2018-07-29: qty 2
  Filled 2018-07-29: qty 1
  Filled 2018-07-29: qty 2

## 2018-07-29 MED ORDER — LORAZEPAM 0.5 MG PO TABS
0.5000 mg | ORAL_TABLET | Freq: Three times a day (TID) | ORAL | Status: DC | PRN
  Administered 2018-07-29 – 2018-08-01 (×7): 0.5 mg via ORAL
  Filled 2018-07-29 (×7): qty 1

## 2018-07-29 MED ORDER — ACETAMINOPHEN 325 MG PO TABS
650.0000 mg | ORAL_TABLET | Freq: Four times a day (QID) | ORAL | Status: DC | PRN
  Administered 2018-07-30 – 2018-07-31 (×2): 650 mg via ORAL
  Filled 2018-07-29 (×2): qty 2

## 2018-07-29 MED ORDER — ENOXAPARIN SODIUM 40 MG/0.4ML ~~LOC~~ SOLN
40.0000 mg | SUBCUTANEOUS | Status: DC
  Administered 2018-07-29 – 2018-07-31 (×3): 40 mg via SUBCUTANEOUS
  Filled 2018-07-29 (×3): qty 0.4

## 2018-07-29 MED ORDER — SODIUM CHLORIDE 0.9 % IV SOLN
Freq: Once | INTRAVENOUS | Status: AC
  Administered 2018-07-29: 12:00:00 via INTRAVENOUS

## 2018-07-29 MED ORDER — MUPIROCIN 2 % EX OINT
1.0000 "application " | TOPICAL_OINTMENT | Freq: Two times a day (BID) | CUTANEOUS | Status: DC
  Administered 2018-07-29 – 2018-08-01 (×6): 1 via NASAL
  Filled 2018-07-29: qty 22

## 2018-07-29 MED ORDER — PHENAZOPYRIDINE HCL 100 MG PO TABS
95.0000 mg | ORAL_TABLET | Freq: Once | ORAL | Status: AC
  Administered 2018-07-29: 100 mg via ORAL
  Filled 2018-07-29: qty 1

## 2018-07-29 MED ORDER — SENNOSIDES-DOCUSATE SODIUM 8.6-50 MG PO TABS: 1.0000 | ORAL_TABLET | Freq: Every evening | ORAL | Status: DC | PRN

## 2018-07-29 MED ORDER — ONDANSETRON HCL 4 MG PO TABS: 4.0000 mg | ORAL_TABLET | Freq: Four times a day (QID) | ORAL | Status: DC | PRN

## 2018-07-29 MED ORDER — LORAZEPAM 0.5 MG PO TABS
0.5000 mg | ORAL_TABLET | Freq: Once | ORAL | Status: AC
  Administered 2018-07-29: 0.5 mg via ORAL
  Filled 2018-07-29: qty 1

## 2018-07-29 MED ORDER — SODIUM CHLORIDE 0.9 % IV SOLN
INTRAVENOUS | Status: AC
  Administered 2018-07-29 – 2018-07-31 (×4): via INTRAVENOUS

## 2018-07-29 MED ORDER — LORAZEPAM 2 MG/ML IJ SOLN
0.5000 mg | Freq: Once | INTRAMUSCULAR | Status: AC
  Administered 2018-07-29: 0.5 mg via INTRAVENOUS
  Filled 2018-07-29: qty 1

## 2018-07-29 MED ORDER — AMLODIPINE BESYLATE 5 MG PO TABS
5.0000 mg | ORAL_TABLET | Freq: Every day | ORAL | Status: DC
  Administered 2018-07-29 – 2018-08-01 (×4): 5 mg via ORAL
  Filled 2018-07-29 (×4): qty 1

## 2018-07-29 MED ORDER — TAMSULOSIN HCL 0.4 MG PO CAPS
0.4000 mg | ORAL_CAPSULE | Freq: Every day | ORAL | Status: DC
  Administered 2018-07-29 – 2018-07-31 (×3): 0.4 mg via ORAL
  Filled 2018-07-29 (×3): qty 1

## 2018-07-29 MED ORDER — CIPROFLOXACIN HCL 500 MG PO TABS: 250.0000 mg | ORAL_TABLET | Freq: Two times a day (BID) | ORAL | Status: DC

## 2018-07-29 MED ORDER — ONDANSETRON HCL 4 MG/2ML IJ SOLN: 4.0000 mg | Freq: Four times a day (QID) | INTRAMUSCULAR | Status: DC | PRN

## 2018-07-29 NOTE — ED Notes (Signed)
Report received from hunter. Pt resting comfortably with family at bedside

## 2018-07-29 NOTE — Progress Notes (Signed)
Pt arrived to room 152 from ED. Daughters at bedside. Pt on room air. IV infusing NS at 75 mL/hr. Pt incontinent and changed. Stage 1 pressure injury on pt's bottom. Pink foam dressing applied. Pt alert but confused and hard of hearing. Pt's daughter taking hearing aides home. Glasses at bedside. Pain medication given. Pt complains of burning sensation in private area. Will page MD [place]. No other complaints or questions from pt or family at this time.

## 2018-07-29 NOTE — Progress Notes (Signed)
Advanced care plan.  Purpose of the Encounter: CODE STATUS  Parties in Attendance: Patient and family  Patient's Decision Capacity: Not good  Subjective/Patient's story: Presented to the emergency room for generalized weakness   Objective/Medical story Has hyponatremia On antibiotics for urinary tract infection   Goals of care determination:  Advance care directives and goals of care discussed with patient's family They want everything done which includes CPR, intubation ventilator if the need arises   CODE STATUS: Full code   Time spent discussing advanced care planning: 16 minutes

## 2018-07-29 NOTE — ED Provider Notes (Signed)
[facility] Emergency Department Provider Note       Time seen: ----------------------------------------- 10:05 AM on 07/29/2018 -----------------------------------------   I have reviewed the triage vital signs and the nursing notes.  HISTORY   Chief Complaint Urinary Frequency    HPI [name] is a 84 y.o. female with a history of anxiety, chronic back pain, hyperlipidemia, hypertension, pneumonia who presents to the ED for urinary burning for the past 2 days according to EMS.  Patient was started on oral antibiotics by the primary care doctor for UTI.  Also recently she has been taken off of Ativan.  Previously she was taking this daily because the pain medicine she took for chronic back pain caused her to be anxious.  She states she took 1 of her daughters Xanax which did help some.  Past Medical History:  Diagnosis Date  . Allergy    environmental  . Anxiety   . Chronic back pain   . High cholesterol   . History of urinary retention   . Hypertension   . Migraines   . Osteoporosis   . Pneumonia   . Sinusitis     Patient Active Problem List   Diagnosis Date Noted  . Cervical lymphadenopathy 07/18/2018  . Pain in right hip 07/18/2018  . Acute recurrent sinusitis 06/25/2018  . Lymphadenopathy 06/25/2018  . Stomatitis and mucositis 06/25/2018  . Chest pain 02/16/2018  . Shortness of breath 02/16/2018  . Other fatigue 02/16/2018  . Arm fracture, left 01/03/2018  . Arthritis 01/03/2018  . Compression fracture of thoracic vertebra ([place] [address]) 01/03/2018  . Emphysema lung ([place]) 01/03/2018  . GERD (gastroesophageal reflux disease) 01/03/2018  . GI bleeding 01/03/2018  . Lumbar spondylosis 01/03/2018  . Menstrual problem 01/03/2018  . Osteoarthritis of knee 01/03/2018  . Osteoporosis 01/03/2018  . Acute pain of right knee 01/03/2018  . Ankle sprain 01/03/2018  . Synovial cyst of popliteal space 01/03/2018  . History of urinary retention  11/10/2017  . Generalized anxiety disorder 10/24/2017  . Constipation 10/24/2017  . Internal hemorrhoids 10/24/2017  . Essential (primary) hypertension 10/24/2017  . Thrombocytopenia ([place]) 10/24/2017  . Acute encephalopathy 08/04/2017  . Hyponatremia 12/15/2015    Past Surgical History:  Procedure Laterality Date  . ABDOMINAL HYSTERECTOMY    . APPENDECTOMY    . BACK SURGERY    . BREAST BIOPSY Right 2002   neg  . HEMORRHOID SURGERY    . TONSILLECTOMY      Allergies Ceftin [cefuroxime]; Cortisone; Fluticasone; Levofloxacin; Meperidine; Morphine and related; Morphine sulfate; Nisoldipine; Penicillin g potassium in d5w; Prednisone; and Sulfa antibiotics  Social History Social History   Tobacco Use  . Smoking status: Never Smoker  . Smokeless tobacco: Never Used  Substance Use Topics  . Alcohol use: No  . Drug use: No   Review of Systems Constitutional: Negative for fever. Cardiovascular: Negative for chest pain. Respiratory: Negative for shortness of breath. Gastrointestinal: Negative for abdominal pain, vomiting and diarrhea. Musculoskeletal: Negative for back pain. Genitourinary: Positive for dysuria Skin: Negative for rash. Neurological: Negative for headaches, focal weakness or numbness. Psychiatric: Positive for anxiety  All systems negative/normal/unremarkable except as stated in the HPI  ____________________________________________   PHYSICAL EXAM:  VITAL SIGNS: ED Triage Vitals  Enc Vitals Group     BP      Pulse      Resp      Temp      Temp src      SpO2  Weight      Height      Head Circumference      Peak Flow      Pain Score      Pain Loc      Pain Edu?      Excl. in [place]?    Constitutional: Alert,  Anxious, no distress Eyes: Conjunctivae are normal. Normal extraocular movements. ENT   Head: Normocephalic and atraumatic.   Nose: No congestion/rhinnorhea.   Mouth/Throat: Mucous membranes are moist.   Neck: No  stridor. Cardiovascular: Normal rate, regular rhythm. No murmurs, rubs, or gallops. Respiratory: Normal respiratory effort without tachypnea nor retractions. Breath sounds are clear and equal bilaterally. No wheezes/rales/rhonchi. Gastrointestinal: Soft and nontender. Normal bowel sounds Musculoskeletal: Nontender with normal range of motion in extremities. No lower extremity tenderness nor edema. Neurologic:  Normal speech and language. No gross focal neurologic deficits are appreciated.  Skin:  Skin is warm, dry and intact. No rash noted. Psychiatric: Anxious mood and affect ____________________________________________  ED COURSE:  As part of my medical decision making, I reviewed the following data within the [place] History obtained from family if available, nursing notes, old chart and ekg, as well as notes from prior ED visits. Patient presented for dysuria and anxiety, we will assess with labs and imaging as indicated at this time.   Procedures ____________________________________________   LABS (pertinent positives/negatives)  Labs Reviewed  CBC WITH DIFFERENTIAL/PLATELET - Abnormal; Notable for the following components:      Result Value   WBC 10.9 (*)    Hemoglobin 11.7 (*)    HCT 35.1 (*)    Monocytes Absolute 1.7 (*)    All other components within normal limits  COMPREHENSIVE METABOLIC PANEL - Abnormal; Notable for the following components:   Sodium 124 (*)    Chloride 92 (*)    Glucose, Bld 103 (*)    Creatinine, Ser 0.38 (*)    All other components within normal limits  URINALYSIS, COMPLETE (UACMP) WITH MICROSCOPIC - Abnormal; Notable for the following components:   Color, Urine YELLOW (*)    APPearance CLEAR (*)    Specific Gravity, Urine 1.004 (*)    Hgb urine dipstick SMALL (*)    All other components within normal limits  TROPONIN I  ____________________________________________  DIFFERENTIAL DIAGNOSIS   Dehydration, electrolyte  abnormality, occult infection, UTI, pyelonephritis  FINAL ASSESSMENT AND PLAN  Hyponatremia, dysuria   Plan: The patient had presented for urinary burning as well as anxiety after being abruptly taken off of Ativan. Patient's labs did reveal hyponatremia with a sodium of 124.  We have started her on a saline infusion as well as given her Ativan.  I will discuss with the hospitalist for observation.  No clear etiology for her dysuria.   Laurence Aly, MD   Note: This note was generated in part or whole with voice recognition software. Voice recognition is usually quite accurate but there are transcription errors that can and very often do occur. I apologize for any typographical errors that were not detected and corrected.     Earleen Newport, MD 07/29/18 [phone]

## 2018-07-29 NOTE — H&P (Addendum)
[facility] [place] NAME: [name]    MR#:  [id number]  DATE OF BIRTH:  [birth year]  DATE OF ADMISSION:  07/29/2018  PRIMARY CARE PHYSICIAN: Lavera Guise, MD   REQUESTING/REFERRING PHYSICIAN:   CHIEF COMPLAINT:   Chief Complaint  Patient presents with  . Urinary Frequency    HISTORY OF PRESENT ILLNESS: [name]  is a 84 y.o. female with a known history of hypertension, osteoporosis, urinary retention currently on oral Keflex antibiotic for dysuria.  She had a urinalysis done in the emergency room but showed no infection.  Work-up in the emergency room showed a sodium of 124.  I recently patient's Ativan has been stopped by primary care physician and patient has been acting strange according to family members.  She is sleepy and does not volunteer much information.  No history of fall and head injury.  Hospitalist service was consulted for further care.  Family at bedside.  PAST MEDICAL HISTORY:   Past Medical History:  Diagnosis Date  . Allergy    environmental  . Anxiety   . Chronic back pain   . High cholesterol   . History of urinary retention   . Hypertension   . Migraines   . Osteoporosis   . Pneumonia   . Sinusitis     PAST SURGICAL HISTORY:  Past Surgical History:  Procedure Laterality Date  . ABDOMINAL HYSTERECTOMY    . APPENDECTOMY    . BACK SURGERY    . BREAST BIOPSY Right 2002   neg  . HEMORRHOID SURGERY    . TONSILLECTOMY      SOCIAL HISTORY:  Social History   Tobacco Use  . Smoking status: Never Smoker  . Smokeless tobacco: Never Used  Substance Use Topics  . Alcohol use: No    FAMILY HISTORY:  Family History  Problem Relation Age of Onset  . Breast cancer Sister 57  . Breast cancer Maternal Grandmother 54  . Breast cancer Sister 63  . Bladder Cancer Brother   . Hypertension Neg Hx   . Kidney cancer Neg Hx     DRUG ALLERGIES:  Allergies  Allergen Reactions  . Ceftin  [Cefuroxime]   . Cortisone Other (See Comments)    Headache   . Fluticasone Swelling    Facial  . Levofloxacin Nausea And Vomiting  . Meperidine Other (See Comments)  . Morphine And Related Other (See Comments)    headache  . Morphine Sulfate   . Nisoldipine   . Penicillin G Potassium In D5w   . Prednisone Other (See Comments)    Headache   . Sulfa Antibiotics Nausea Only    REVIEW OF SYSTEMS:  Unable to obtain as patient is lethargic and sleepy MEDICATIONS AT HOME:  Prior to Admission medications   Medication Sig Start Date End Date Taking? Authorizing Provider  amLODipine (NORVASC) 5 MG tablet Take 1 tablet (5 mg total) by mouth daily. 04/28/18  Yes Lavera Guise, MD  carvedilol (COREG) 6.25 MG tablet Take 1 tablet (6.25 mg total) by mouth 2 (two) times daily with a meal. 04/28/18  Yes Lavera Guise, MD  cephALEXin (KEFLEX) 500 MG capsule Take 1 capsule (500 mg total) by mouth 3 (three) times daily. 07/27/18  Yes Ronnell Freshwater, NP  furosemide (LASIX) 20 MG tablet Take 1 tablet po QD prn 04/28/18  Yes Lavera Guise, MD  HYDROcodone-acetaminophen [facility]) 10-325 MG tablet Take 1 tablet by mouth every  4 (four) hours as needed for moderate pain.    Yes [provider]  mirtazapine (REMERON) 30 MG tablet Take 1 tablet (30 mg total) by mouth at bedtime. 06/16/18  Yes Boscia, Greer Ee, NP  tamsulosin (FLOMAX) 0.4 MG CAPS capsule Take 1 capsule (0.4 mg total) by mouth daily. 04/21/18  Yes McGowan, Larene Beach A, PA-C  LORazepam (ATIVAN) 0.5 MG tablet Take 1 tablet (0.5 mg total) by mouth 3 (three) times daily as needed for anxiety. Patient not taking: Reported on 07/29/2018 06/28/18   Ronnell Freshwater, NP  mupirocin ointment (BACTROBAN) 2 % Place 1 application into the nose 2 (two) times daily. Patient not taking: Reported on 07/29/2018 03/15/18   Ronnell Freshwater, NP      PHYSICAL EXAMINATION:   VITAL SIGNS: Blood pressure (!) 143/84, pulse 70, temperature (!) 97.4 F (36.3 C),  temperature source Oral, resp. rate 12, weight 60.3 kg, SpO2 93 %.  GENERAL:  84 y.o.-year-old patient lying in the bed with no acute distress.  EYES: Pupils equal, round, reactive to light and accommodation. No scleral icterus. Extraocular muscles intact.  HEENT: Head atraumatic, normocephalic. Oropharynx dry and nasopharynx clear.  NECK:  Supple, no jugular venous distention. No thyroid enlargement, no tenderness.  LUNGS: Normal breath sounds bilaterally, no wheezing, rales,rhonchi or crepitation. No use of accessory muscles of respiration.  CARDIOVASCULAR: S1, S2 normal. No murmurs, rubs, or gallops.  ABDOMEN: Soft, nontender, nondistended. Bowel sounds present. No organomegaly or mass.  EXTREMITIES: No pedal edema, cyanosis, or clubbing.  NEUROLOGIC: Cranial nerves II through XII are intact. Muscle strength 5/5 in all extremities. Sensation intact.  Awake, arousable and oriented to self PSYCHIATRIC: The patient is alert and oriented x 1 SKIN: pressure ulcer stage I sacral area  LABORATORY PANEL:   CBC Recent Labs  Lab 07/29/18 1046  WBC 10.9*  HGB 11.7*  HCT 35.1*  PLT 183  MCV 86.2  MCH 28.7  MCHC 33.3  RDW 12.8  LYMPHSABS 1.8  MONOABS 1.7*  EOSABS 0.1  BASOSABS 0.0   ------------------------------------------------------------------------------------------------------------------  Chemistries  Recent Labs  Lab 07/29/18 1046  NA 124*  K 4.2  CL 92*  CO2 26  GLUCOSE 103*  BUN 9  CREATININE 0.38*  CALCIUM 9.4  AST 31  ALT 26  ALKPHOS 89  BILITOT 1.0   ------------------------------------------------------------------------------------------------------------------ estimated creatinine clearance is 37.9 mL/min (A) (by C-G formula based on SCr of 0.38 mg/dL (L)). ------------------------------------------------------------------------------------------------------------------ No results for input(s): TSH, T4TOTAL, T3FREE, THYROIDAB in the last 72  hours.  Invalid input(s): FREET3   Coagulation profile No results for input(s): INR, PROTIME in the last 168 hours. ------------------------------------------------------------------------------------------------------------------- No results for input(s): DDIMER in the last 72 hours. -------------------------------------------------------------------------------------------------------------------  Cardiac Enzymes Recent Labs  Lab 07/29/18 1046  TROPONINI <0.03   ------------------------------------------------------------------------------------------------------------------ Invalid input(s): POCBNP  ---------------------------------------------------------------------------------------------------------------  Urinalysis    Component Value Date/Time   COLORURINE YELLOW (A) 07/29/2018 1046   APPEARANCEUR CLEAR (A) 07/29/2018 1046   APPEARANCEUR Clear 02/09/2018 1601   LABSPEC 1.004 (L) 07/29/2018 1046   LABSPEC 1.004 08/05/2014 0115   PHURINE 8.0 07/29/2018 1046   GLUCOSEU NEGATIVE 07/29/2018 1046   GLUCOSEU Negative 08/05/2014 0115   HGBUR SMALL (A) 07/29/2018 1046   BILIRUBINUR NEGATIVE 07/29/2018 1046   BILIRUBINUR negative 04/28/2018 1447   BILIRUBINUR Negative 02/09/2018 1601   BILIRUBINUR Negative 08/05/2014 0115   KETONESUR NEGATIVE 07/29/2018 1046   PROTEINUR NEGATIVE 07/29/2018 1046   UROBILINOGEN negative (A) 04/28/2018 1447   UROBILINOGEN 0.2 03/07/2008 1048  NITRITE NEGATIVE 07/29/2018 1046   LEUKOCYTESUR NEGATIVE 07/29/2018 1046   LEUKOCYTESUR 1+ (A) 02/09/2018 1601   LEUKOCYTESUR Negative 08/05/2014 0115     RADIOLOGY: No results found.  EKG: Orders placed or performed during the hospital encounter of 07/29/18  . ED EKG  . ED EKG    IMPRESSION AND PLAN:  84 year old female patient with history of hypertension, osteoporosis, urinary retention presented to the emergency room for dysuria.  Urinalysis showed no infection.  -Acute  hyponatremia Gentle IV fluids Hold diuretics Monitor sodium level  -Acute cystitis Oral keflex antibiotic  -Hypertension Resume home meds  -DVT prophylaxis subcu Lovenox daily  -Ambulatory dysfunction Physical therapy evaluation  All the records are reviewed and case discussed with ED provider. Management plans discussed with the patient, family and they are in agreement.  CODE STATUS:Full code Code Status History    Date Active Date Inactive Code Status Order ID Comments User Context   08/04/2017 1650 08/07/2017 1850 Full Code [id number]  Bettey Costa, MD Inpatient   08/04/2017 1547 08/04/2017 1650 Full Code [id number]  Bettey Costa, MD ED   12/15/2015 0814 12/19/2015 1632 Full Code [id number]  Saundra Shelling, MD Inpatient       TOTAL TIME TAKING CARE OF THIS PATIENT: 51 minutes.    Saundra Shelling M.D on 07/29/2018 at 1:19 PM  Between 7am to 6pm - Pager - [phone]  After 6pm go to [email or web address] - password [email or web address] [facility]  [facility]  Office  [phone]  CC: Primary care physician; Lavera Guise, MD

## 2018-07-29 NOTE — ED Notes (Signed)
Family requested pt have a bladder scan for possible retention. Pt has been changed for incontinence multiple times in the last 2 hrs.  Bladder scan showed pt not retaining - had 190cc urine. Family wants to speak with dr. Dr Pyreddi went in to speak with family

## 2018-07-29 NOTE — ED Triage Notes (Signed)
Pt presents via POV c/o urinary burning x2 days per EMS. Started on PO abx by PCP for UTI per report. Also has hx of anxiety with recent d/c of PO ativan.

## 2018-07-30 DIAGNOSIS — Z880 Allergy status to penicillin: Secondary | ICD-10-CM | POA: Diagnosis not present

## 2018-07-30 DIAGNOSIS — Z885 Allergy status to narcotic agent status: Secondary | ICD-10-CM | POA: Diagnosis not present

## 2018-07-30 DIAGNOSIS — R269 Unspecified abnormalities of gait and mobility: Secondary | ICD-10-CM | POA: Diagnosis present

## 2018-07-30 DIAGNOSIS — E861 Hypovolemia: Secondary | ICD-10-CM | POA: Diagnosis present

## 2018-07-30 DIAGNOSIS — H9190 Unspecified hearing loss, unspecified ear: Secondary | ICD-10-CM | POA: Diagnosis present

## 2018-07-30 DIAGNOSIS — M810 Age-related osteoporosis without current pathological fracture: Secondary | ICD-10-CM | POA: Diagnosis present

## 2018-07-30 DIAGNOSIS — Z803 Family history of malignant neoplasm of breast: Secondary | ICD-10-CM | POA: Diagnosis not present

## 2018-07-30 DIAGNOSIS — E871 Hypo-osmolality and hyponatremia: Secondary | ICD-10-CM | POA: Diagnosis not present

## 2018-07-30 DIAGNOSIS — R339 Retention of urine, unspecified: Secondary | ICD-10-CM | POA: Diagnosis present

## 2018-07-30 DIAGNOSIS — L89151 Pressure ulcer of sacral region, stage 1: Secondary | ICD-10-CM | POA: Diagnosis present

## 2018-07-30 DIAGNOSIS — I10 Essential (primary) hypertension: Secondary | ICD-10-CM | POA: Diagnosis not present

## 2018-07-30 DIAGNOSIS — R262 Difficulty in walking, not elsewhere classified: Secondary | ICD-10-CM | POA: Diagnosis not present

## 2018-07-30 DIAGNOSIS — M545 Low back pain: Secondary | ICD-10-CM | POA: Diagnosis present

## 2018-07-30 DIAGNOSIS — Z87440 Personal history of urinary (tract) infections: Secondary | ICD-10-CM | POA: Diagnosis not present

## 2018-07-30 DIAGNOSIS — Z882 Allergy status to sulfonamides status: Secondary | ICD-10-CM | POA: Diagnosis not present

## 2018-07-30 DIAGNOSIS — N3000 Acute cystitis without hematuria: Secondary | ICD-10-CM | POA: Diagnosis not present

## 2018-07-30 DIAGNOSIS — Z881 Allergy status to other antibiotic agents status: Secondary | ICD-10-CM | POA: Diagnosis not present

## 2018-07-30 DIAGNOSIS — R300 Dysuria: Secondary | ICD-10-CM | POA: Diagnosis not present

## 2018-07-30 DIAGNOSIS — F419 Anxiety disorder, unspecified: Secondary | ICD-10-CM | POA: Diagnosis present

## 2018-07-30 DIAGNOSIS — G8929 Other chronic pain: Secondary | ICD-10-CM | POA: Diagnosis present

## 2018-07-30 DIAGNOSIS — Z8052 Family history of malignant neoplasm of bladder: Secondary | ICD-10-CM | POA: Diagnosis not present

## 2018-07-30 DIAGNOSIS — Z79891 Long term (current) use of opiate analgesic: Secondary | ICD-10-CM | POA: Diagnosis not present

## 2018-07-30 DIAGNOSIS — D638 Anemia in other chronic diseases classified elsewhere: Secondary | ICD-10-CM | POA: Diagnosis present

## 2018-07-30 DIAGNOSIS — Z90710 Acquired absence of both cervix and uterus: Secondary | ICD-10-CM | POA: Diagnosis not present

## 2018-07-30 LAB — BASIC METABOLIC PANEL
Anion gap: 6 (ref 5–15)
BUN: 9 mg/dL (ref 8–23)
CALCIUM: 8.7 mg/dL — AB (ref 8.9–10.3)
CO2: 24 mmol/L (ref 22–32)
CREATININE: 0.43 mg/dL — AB (ref 0.44–1.00)
Chloride: 95 mmol/L — ABNORMAL LOW (ref 98–111)
GFR calc Af Amer: >60 mL/min (ref >60)
GFR calc non Af Amer: >60 mL/min (ref >60)
GLUCOSE: 96 mg/dL (ref 70–99)
POTASSIUM: 3.5 mmol/L (ref 3.5–5.1)
SODIUM: 125 mmol/L — AB (ref 135–145)

## 2018-07-30 LAB — CBC
HCT: 34.6 % — ABNORMAL LOW (ref 36.0–46.0)
Hemoglobin: 11.4 g/dL — ABNORMAL LOW (ref 12.0–15.0)
MCH: 28.4 pg (ref 26.0–34.0)
MCHC: 32.9 g/dL (ref 30.0–36.0)
MCV: 86.1 fL (ref 80.0–100.0)
PLATELETS: 195 10*3/uL (ref 150–400)
RBC: 4.02 MIL/uL (ref 3.87–5.11)
RDW: 13 % (ref 11.5–15.5)
WBC: 7.7 10*3/uL (ref 4.0–10.5)
nRBC: 0 % (ref 0.0–0.2)

## 2018-07-30 MED ORDER — OXYCODONE-ACETAMINOPHEN 5-325 MG PO TABS
1.0000 | ORAL_TABLET | Freq: Once | ORAL | Status: AC
  Administered 2018-07-30: 1 via ORAL
  Filled 2018-07-30: qty 1

## 2018-07-30 MED ORDER — MORPHINE SULFATE (PF) 2 MG/ML IV SOLN
1.0000 mg | Freq: Once | INTRAVENOUS | Status: DC
  Filled 2018-07-30: qty 1

## 2018-07-30 MED ORDER — HYDROCODONE-ACETAMINOPHEN 10-325 MG PO TABS
1.0000 | ORAL_TABLET | Freq: Four times a day (QID) | ORAL | Status: DC | PRN
  Administered 2018-07-31 (×2): 1 via ORAL
  Filled 2018-07-30 (×2): qty 1

## 2018-07-30 NOTE — Care Management Note (Signed)
Case Management Note  Patient Details  Name: [name] MRN: [id number] Date of Birth: 03/10/1925  Subjective/Objective:  RNCM consulted on patient for possible needs. Patient with some alterations in mental status due to hyponatremia. I spoke primarily to daughter [name] about the OBS letter and living situation. Patient and daughter live together and at baseline patient is mainly independent with activities of daily living. Patient has walker, wheelchair and bedside commode in the home from a previous back surgery. Daughter expresses that she has no need for home health or any more DME at this time. Daughter is able to drive patient to appointments. PCP is Dr Humphrey Rolls.                     Action/Plan:   Expected Discharge Date:                  Expected Discharge Plan:     In-House Referral:     Discharge planning Services     Post Acute Care Choice:    Choice offered to:     DME Arranged:    DME Agency:     HH Arranged:    HH Agency:     Status of Service:     If discussed at [employer] of Stay Meetings, dates discussed:    Additional Comments:  Charlene Drown Chiquitta Matty, RN 07/30/2018, 9:04 AM

## 2018-07-30 NOTE — Progress Notes (Signed)
Spoke with daughter [name] this morning. [name] was updated on patients's night. Will continue to monitor and endorse.

## 2018-07-30 NOTE — Care Management Obs Status (Signed)
[facility] NOTIFICATION   Patient Details  Name: [name] MRN: [id number] Date of Birth: 30-Oct-1924   Medicare Observation Status Notification Given:  Yes    Lynleigh Kovack A Erik Nessel, RN 07/30/2018, 9:07 AM

## 2018-07-30 NOTE — Progress Notes (Signed)
[facility] [place] NAME: [name]    MR#:  [id number]  DATE OF BIRTH:  [birth year]  SUBJECTIVE:  CHIEF COMPLAINT:   Chief Complaint  Patient presents with  . Urinary Frequency   - patient is very tearful, feels weak - sodium still at 125  REVIEW OF SYSTEMS:  Review of Systems  Constitutional: Positive for malaise/fatigue. Negative for chills and fever.  HENT: Positive for hearing loss. Negative for congestion, nosebleeds and sinus pain.   Eyes: Negative for blurred vision and double vision.  Respiratory: Negative for cough, shortness of breath and wheezing.   Cardiovascular: Negative for chest pain and palpitations.  Gastrointestinal: Negative for abdominal pain, constipation, diarrhea, nausea and vomiting.  Genitourinary: Negative for dysuria.  Musculoskeletal: Negative for myalgias.  Neurological: Negative for dizziness, focal weakness, seizures, weakness and headaches.  Psychiatric/Behavioral: Negative for depression.    DRUG ALLERGIES:   Allergies  Allergen Reactions  . Ceftin [Cefuroxime]   . Cortisone Other (See Comments)    Headache   . Fluticasone Swelling    Facial  . Levofloxacin Nausea And Vomiting  . Meperidine Other (See Comments)  . Morphine And Related Other (See Comments)    headache  . Morphine Sulfate   . Nisoldipine   . Penicillin G Potassium In D5w   . Prednisone Other (See Comments)    Headache   . Sulfa Antibiotics Nausea Only    VITALS:  Blood pressure 128/79, pulse 79, temperature 98.6 F (37 C), temperature source Oral, resp. rate 17, weight 60.3 kg, SpO2 93 %.  PHYSICAL EXAMINATION:  Physical Exam  GENERAL:  84 y.o.-year-old elderly ill built and ill nourished patient lying in the bed with no acute distress. VERY HARD OF HEARING EYES: Pupils equal, round, reactive to light and accommodation. No scleral icterus. Extraocular muscles intact.  HEENT: Head atraumatic, normocephalic.  Oropharynx and nasopharynx clear.  NECK:  Supple, no jugular venous distention. No thyroid enlargement, no tenderness.  LUNGS: Normal breath sounds bilaterally, no wheezing, rales,rhonchi or crepitation. No use of accessory muscles of respiration. Decreased at the bases CARDIOVASCULAR: S1, S2 normal. No  rubs, or gallops. 2/6 systolic murmur present ABDOMEN: Soft, nontender, nondistended. Bowel sounds present. No organomegaly or mass.  EXTREMITIES: No pedal edema, cyanosis, or clubbing.  NEUROLOGIC: Cranial nerves II through XII are intact. Muscle strength 5/5 in all extremities. Sensation intact. Gait not checked. global weakness  PSYCHIATRIC: The patient is alert and oriented x 3.  SKIN: No obvious rash, lesion, or ulcer.    LABORATORY PANEL:   CBC Recent Labs  Lab 07/30/18 0556  WBC 7.7  HGB 11.4*  HCT 34.6*  PLT 195   ------------------------------------------------------------------------------------------------------------------  Chemistries  Recent Labs  Lab 07/29/18 1046 07/30/18 0556  NA 124* 125*  K 4.2 3.5  CL 92* 95*  CO2 26 24  GLUCOSE 103* 96  BUN 9 9  CREATININE 0.38* 0.43*  CALCIUM 9.4 8.7*  AST 31  --   ALT 26  --   ALKPHOS 89  --   BILITOT 1.0  --    ------------------------------------------------------------------------------------------------------------------  Cardiac Enzymes Recent Labs  Lab 07/29/18 1046  TROPONINI <0.03   ------------------------------------------------------------------------------------------------------------------  RADIOLOGY:  No results found.  EKG:   Orders placed or performed during the hospital encounter of 07/29/18  . ED EKG  . ED EKG    ASSESSMENT AND PLAN:   84 year old female from home with past medical history significant for hypertension, osteoporosis, low  back pain, recurrent UTIs presents to hospital secondary to increased urinary frequency and weakness.  1.  Hyponatremia-prior history of  hyponatremia noted as well.  Could be hypovolemic in nature.  Will give IV fluids and monitor -If no improvement with fluids tomorrow, consult nephrology for tolvaptan.  2.  Recent UTI-on oral Keflex-continue for 5 more days -Repeat UA here on admission did not show any infection.  3.  Anemia of chronic disease-stable.  No indication for transfusion.  4.  Hypertension-Norvasc, Coreg  5.  DVT prophylaxis-Lovenox  Physical therapy consult   All the records are reviewed and case discussed with Care Management/Social Workerr. Management plans discussed with the patient, family and they are in agreement.  CODE STATUS: Full Code  TOTAL TIME TAKING CARE OF THIS PATIENT: 38 minutes.   POSSIBLE D/C IN 2 DAYS, DEPENDING ON CLINICAL CONDITION.   Autrey Human M.D on 07/30/2018 at 12:03 PM  Between 7am to 6pm - Pager - [phone]  After 6pm go to [email or web address] - password [email or web address] [place] [facility]  Office  [phone]  CC: Primary care physician; Lavera Guise, MD

## 2018-07-31 DIAGNOSIS — R262 Difficulty in walking, not elsewhere classified: Secondary | ICD-10-CM | POA: Diagnosis not present

## 2018-07-31 DIAGNOSIS — E871 Hypo-osmolality and hyponatremia: Secondary | ICD-10-CM | POA: Diagnosis not present

## 2018-07-31 DIAGNOSIS — I10 Essential (primary) hypertension: Secondary | ICD-10-CM | POA: Diagnosis not present

## 2018-07-31 DIAGNOSIS — N3000 Acute cystitis without hematuria: Secondary | ICD-10-CM | POA: Diagnosis not present

## 2018-07-31 LAB — BASIC METABOLIC PANEL
Anion gap: 5 (ref 5–15)
BUN: 7 mg/dL — ABNORMAL LOW (ref 8–23)
CALCIUM: 8.9 mg/dL (ref 8.9–10.3)
CO2: 25 mmol/L (ref 22–32)
Chloride: 100 mmol/L (ref 98–111)
Creatinine, Ser: 0.45 mg/dL (ref 0.44–1.00)
Glucose, Bld: 109 mg/dL — ABNORMAL HIGH (ref 70–99)
Potassium: 3.5 mmol/L (ref 3.5–5.1)
Sodium: 130 mmol/L — ABNORMAL LOW (ref 135–145)

## 2018-07-31 MED ORDER — FUROSEMIDE 20 MG PO TABS
20.0000 mg | ORAL_TABLET | Freq: Every day | ORAL | Status: DC
  Administered 2018-08-01: 20 mg via ORAL
  Filled 2018-07-31: qty 1

## 2018-07-31 MED ORDER — OXYBUTYNIN CHLORIDE 5 MG PO TABS
2.5000 mg | ORAL_TABLET | Freq: Three times a day (TID) | ORAL | Status: DC | PRN
  Filled 2018-07-31 (×2): qty 0.5

## 2018-07-31 MED ORDER — HYDROCODONE-ACETAMINOPHEN 10-325 MG PO TABS
1.0000 | ORAL_TABLET | ORAL | Status: DC
  Administered 2018-07-31 – 2018-08-01 (×8): 1 via ORAL
  Filled 2018-07-31 (×8): qty 1

## 2018-07-31 NOTE — Plan of Care (Signed)

## 2018-07-31 NOTE — Progress Notes (Signed)
PT Cancellation Note  Patient Details Name: ELVERIA LAUDERBAUGH MRN: [id number] DOB: 18-May-1925   Cancelled Treatment:    Reason Eval/Treat Not Completed: (Consult received and chart reviewed.  Patient currently eating lunch; will re-attempt at later time/date as medically appropriate and available.)   Milanya Sunderland H. Owens Shark, PT, DPT, NCS 07/31/18, 12:58 PM [phone]

## 2018-07-31 NOTE — Progress Notes (Signed)
Patient refused to let me bathe her, but did allow me to change her linen and do peri care.

## 2018-07-31 NOTE — Progress Notes (Signed)
[name]    MR#:  [id number]  DATE OF BIRTH:  [birth year]  SUBJECTIVE:  CHIEF COMPLAINT:   Chief Complaint  Patient presents with  . Urinary Frequency   -Sodium at 130 now.  Patient complains of weakness -Daughter updated at bedside -Upset about not getting her pain medication on time and unable to sleep last night.  REVIEW OF SYSTEMS:  Review of Systems  Constitutional: Positive for malaise/fatigue. Negative for chills and fever.  HENT: Positive for hearing loss. Negative for congestion, nosebleeds and sinus pain.   Eyes: Negative for blurred vision and double vision.  Respiratory: Negative for cough, shortness of breath and wheezing.   Cardiovascular: Negative for chest pain and palpitations.  Gastrointestinal: Negative for abdominal pain, constipation, diarrhea, nausea and vomiting.  Genitourinary: Negative for dysuria.  Musculoskeletal: Negative for myalgias.  Neurological: Negative for dizziness, focal weakness, seizures, weakness and headaches.  Psychiatric/Behavioral: Negative for depression.    DRUG ALLERGIES:   Allergies  Allergen Reactions  . Ceftin [Cefuroxime]   . Cortisone Other (See Comments)    Headache   . Fluticasone Swelling    Facial  . Levofloxacin Nausea And Vomiting  . Meperidine Other (See Comments)  . Morphine And Related Other (See Comments)    headache  . Morphine Sulfate   . Nisoldipine   . Penicillin G Potassium In D5w   . Prednisone Other (See Comments)    Headache   . Sulfa Antibiotics Nausea Only    VITALS:  Blood pressure (!) 134/99, pulse 100, temperature 97.7 F (36.5 C), temperature source Oral, resp. rate 18, weight 60.3 kg, SpO2 95 %.  PHYSICAL EXAMINATION:  Physical Exam  GENERAL:  84 y.o.-year-old elderly ill built and ill nourished patient lying in the bed with no acute distress. VERY HARD OF HEARING EYES: Pupils equal, round, reactive to  light and accommodation. No scleral icterus. Extraocular muscles intact.  -Decreased visual acuity HEENT: Head atraumatic, normocephalic. Oropharynx and nasopharynx clear.  NECK:  Supple, no jugular venous distention. No thyroid enlargement, no tenderness.  LUNGS: Normal breath sounds bilaterally, no wheezing, rales,rhonchi or crepitation. No use of accessory muscles of respiration. Decreased at the bases CARDIOVASCULAR: S1, S2 normal. No  rubs, or gallops. 2/6 systolic murmur present ABDOMEN: Soft, nontender, nondistended. Bowel sounds present. No organomegaly or mass.  EXTREMITIES: No pedal edema, cyanosis, or clubbing.  NEUROLOGIC: Cranial nerves II through XII are intact. Muscle strength 5/5 in all extremities. Sensation intact. Gait not checked. global weakness  PSYCHIATRIC: The patient is alert and oriented x 3.  SKIN: No obvious rash, lesion, or ulcer.    LABORATORY PANEL:   CBC Recent Labs  Lab 07/30/18 0556  WBC 7.7  HGB 11.4*  HCT 34.6*  PLT 195   ------------------------------------------------------------------------------------------------------------------  Chemistries  Recent Labs  Lab 07/29/18 1046  07/31/18 0514  NA 124*   < > 130*  K 4.2   < > 3.5  CL 92*   < > 100  CO2 26   < > 25  GLUCOSE 103*   < > 109*  BUN 9   < > 7*  CREATININE 0.38*   < > 0.45  CALCIUM 9.4   < > 8.9  AST 31  --   --   ALT 26  --   --   ALKPHOS 89  --   --   BILITOT 1.0  --   --    < > =  values in this interval not displayed.   ------------------------------------------------------------------------------------------------------------------  Cardiac Enzymes Recent Labs  Lab 07/29/18 1046  TROPONINI <0.03   ------------------------------------------------------------------------------------------------------------------  RADIOLOGY:  No results found.  EKG:   Orders placed or performed during the hospital encounter of 07/29/18  . ED EKG  . ED EKG    ASSESSMENT AND  PLAN:   84 year old female from home with past medical history significant for hypertension, osteoporosis, low back pain, recurrent UTIs presents to hospital secondary to increased urinary frequency and weakness.  1.  Hyponatremia-prior history of hyponatremia noted as well.  Could be hypovolemic in nature.  Improving with IV fluids and monitor -Also will stop fluids later today.  History of polydipsia without eating solid food which can cause hyponatremia -Restart her oral Lasix in a.m.  2.  Recent UTI-on oral Keflex-continue for 5 more days -Repeat UA here on admission did not show any infection.  3.  Anemia of chronic disease-stable.  No indication for transfusion.  4.  Hypertension-Norvasc, Coreg  5.  DVT prophylaxis-Lovenox  6.  Chronic low back pain-change her pain medications to every 4 hours as scheduled like her home dose.  Physical therapy consulted   All the records are reviewed and case discussed with Care Management/Social Workerr. Management plans discussed with the patient, family and they are in agreement.  CODE STATUS: Full Code  TOTAL TIME TAKING CARE OF THIS PATIENT: 38 minutes.   POSSIBLE D/C IN 2 DAYS, DEPENDING ON CLINICAL CONDITION.   Gladstone Lighter M.D on 07/31/2018 at 1:58 PM  Between 7am to 6pm - Pager - [phone]  After 6pm go to [email or web address] - password [email or web address] [place] [facility]  Office  [phone]  CC: Primary care physician; Charlene Guise, MD

## 2018-07-31 NOTE — Evaluation (Signed)
Physical Therapy Evaluation Patient Details Name: [name] MRN: [id number] DOB: 01-Sep-1925 Today's Date: 07/31/2018   History of Present Illness  presented to ER secondary to urinary retention; admitted with acute hyponatremia  Clinical Impression  Upon evaluation, patient alert and oriented; follows commands and demonstrates fair/good effort with mobility tasks. Very HOH, hearing best from L ear at very close range.  Currently requiring min assist for bed mobility, sit/stand, basic transfers and gait (5') with RW.  Generally unsteady with poor balance in A/P plane; constant manual assist to prevent posterior LOB.  Did report mild dizziness with transition to upright; will plan to assess orthostatics next session if symptoms persist.  Notably fatigued with OOB to chair; will progress gait distance next session. Would benefit from skilled PT to address above deficits and promote optimal return to PLOF; recommend transition to STR upon discharge from acute hospitalization.  Will continue to monitor and update as patient improves.     Follow Up Recommendations SNF    Equipment Recommendations       Recommendations for Other Services       Precautions / Restrictions Precautions Precautions: Fall Restrictions Weight Bearing Restrictions: No      Mobility  Bed Mobility Overal bed mobility: Needs Assistance Bed Mobility: Supine to Sit     Supine to sit: Min assist     General bed mobility comments: assist for truncal elevation  Transfers Overall transfer level: Needs assistance Equipment used: Rolling walker (2 wheeled) Transfers: Sit to/from Stand Sit to Stand: Min assist         General transfer comment: cuing for hand placement, assist for lift off, standing balance; increase sway in A/P plane, assist to prevent posterior LOB  Ambulation/Gait Ambulation/Gait assistance: Min assist Gait Distance (Feet): 5 Feet Assistive device: Rolling walker (2 wheeled)        General Gait Details: short, choppy steps; poor balance reactions.  Patient reports "not walking well" when shoes off; encouraged patient to have family bring shoes for next session  Stairs            Wheelchair Mobility    Modified Rankin (Stroke Patients Only)       Balance Overall balance assessment: Needs assistance Sitting-balance support: No upper extremity supported;Feet supported Sitting balance-Leahy Scale: Good     Standing balance support: Bilateral upper extremity supported Standing balance-Leahy Scale: Poor                               Pertinent Vitals/Pain Pain Assessment: No/denies pain    Home Living Family/patient expects to be discharged to:: Private residence Living Arrangements: Children Available Help at Discharge: Family Type of Home: House Home Access: Stairs to enter   [employer] of Steps: 3 in front, 4-5 in back Home Layout: One level Home Equipment: Environmental consultant - 2 wheels Additional Comments: Daughter works outside of home; patient alone while daughter working    Prior Function Level of Independence: Needs assistance         Comments: Mod indep with RW for ADLs, household mobilization;  rarely "leaves the house" per patient report.  Daughter assists with household chores/responsibility as needed     Hand Dominance        Extremity/Trunk Assessment   Upper Extremity Assessment Upper Extremity Assessment: Overall WFL for tasks assessed    Lower Extremity Assessment Lower Extremity Assessment: Generalized weakness(grossly 4-/5 throughout)       Communication  Communication: HOH(hears best in close range, L ear)  Cognition Arousal/Alertness: Awake/alert Behavior During Therapy: WFL for tasks assessed/performed Overall Cognitive Status: Within Functional Limits for tasks assessed                                        General Comments      Exercises     Assessment/Plan     PT Assessment Patient needs continued PT services  PT Problem List Decreased strength;Decreased activity tolerance;Decreased balance;Decreased knowledge of use of DME;Cardiopulmonary status limiting activity;Decreased mobility;Decreased safety awareness;Decreased knowledge of precautions       PT Treatment Interventions DME instruction;Gait training;Functional mobility training;Stair training;Therapeutic activities;Therapeutic exercise;Balance training;Patient/family education    PT Goals (Current goals can be found in the Care Plan section)  Acute Rehab PT Goals Patient Stated Goal: to get out of the bed, and to scratch my back PT Goal Formulation: With patient Time For Goal Achievement: 08/14/18 Potential to Achieve Goals: Good    Frequency Min 2X/week   Barriers to discharge Decreased caregiver support      Co-evaluation               AM-PAC PT "6 Clicks" Daily Activity  Outcome Measure Difficulty turning over in bed (including adjusting bedclothes, sheets and blankets)?: A Little Difficulty moving from lying on back to sitting on the side of the bed? : Unable Difficulty sitting down on and standing up from a chair with arms (e.g., wheelchair, bedside commode, etc,.)?: Unable Help needed moving to and from a bed to chair (including a wheelchair)?: A Little Help needed walking in hospital room?: A Little Help needed climbing 3-5 steps with a railing? : A Lot 6 Click Score: 13    End of Session Equipment Utilized During Treatment: Gait belt Activity Tolerance: Patient limited by fatigue Patient left: in chair;with call bell/phone within reach;with chair alarm set Nurse Communication: Mobility status PT Visit Diagnosis: Unsteadiness on feet (R26.81);Muscle weakness (generalized) (M62.81);Difficulty in walking, not elsewhere classified (R26.2)    Time: 7741-4239 PT Time Calculation (min) (ACUTE ONLY): 20 min   Charges:   PT Evaluation $PT Eval Moderate Complexity:  1 Mod PT Treatments $Therapeutic Activity: 8-22 mins        Lanetra Hartley H. Owens Shark, PT, DPT, NCS 07/31/18, 5:28 PM [phone]

## 2018-08-01 DIAGNOSIS — R262 Difficulty in walking, not elsewhere classified: Secondary | ICD-10-CM | POA: Diagnosis not present

## 2018-08-01 DIAGNOSIS — E871 Hypo-osmolality and hyponatremia: Secondary | ICD-10-CM | POA: Diagnosis not present

## 2018-08-01 DIAGNOSIS — N3000 Acute cystitis without hematuria: Secondary | ICD-10-CM | POA: Diagnosis not present

## 2018-08-01 DIAGNOSIS — I10 Essential (primary) hypertension: Secondary | ICD-10-CM | POA: Diagnosis not present

## 2018-08-01 LAB — BASIC METABOLIC PANEL
ANION GAP: 4 — AB (ref 5–15)
BUN: 10 mg/dL (ref 8–23)
CALCIUM: 8.9 mg/dL (ref 8.9–10.3)
CHLORIDE: 103 mmol/L (ref 98–111)
CO2: 26 mmol/L (ref 22–32)
Creatinine, Ser: 0.48 mg/dL (ref 0.44–1.00)
GFR calc non Af Amer: >60 mL/min (ref >60)
Glucose, Bld: 95 mg/dL (ref 70–99)
POTASSIUM: 4 mmol/L (ref 3.5–5.1)
Sodium: 133 mmol/L — ABNORMAL LOW (ref 135–145)

## 2018-08-01 MED ORDER — LORAZEPAM 0.5 MG PO TABS: 0.5000 mg | ORAL_TABLET | Freq: Three times a day (TID) | ORAL | 0 refills | Status: DC | PRN

## 2018-08-01 MED ORDER — OXYBUTYNIN CHLORIDE 5 MG PO TABS: 5.0000 mg | ORAL_TABLET | Freq: Two times a day (BID) | ORAL | 0 refills | Status: DC | PRN

## 2018-08-01 NOTE — Clinical Social Work Note (Signed)
Clinical Social Work Assessment  Patient Details  Name: [name] MRN: [id number] Date of Birth: 01-23-1925  Date of referral:  08/01/18               Reason for consult:  Facility Placement                Permission sought to share information with:  Case Manager Permission granted to share information::  Yes, Verbal Permission Granted  Name::        Agency::     Relationship::     Contact Information:     Housing/Transportation Living arrangements for the past 2 months:  Single Family Home Source of Information:  Patient, Adult Children Patient Interpreter Needed:  None Criminal Activity/Legal Involvement Pertinent to Current Situation/Hospitalization:  No - Comment as needed Significant Relationships:  Adult Children Lives with:  Adult Children Do you feel safe going back to the place where you live?  Yes Need for family participation in patient care:  Yes (Comment)  Care giving concerns:  Patient lives in [place] with her daughter [name].    Social Worker assessment / plan:  Holiday representative (CSW) received SNF consult. PT is recommending SNF. CSW met with patient alone at bedside this morning to discuss D/C plan. Patient was alert and oriented X4 however was very hard of hearing. CSW introduced self and explained role of CSW department. Per patient she lives in [place] with her daughter [name]. CSW explained SNF process. Patient adamantly refused SNF and reported that she is going home. Per patient she has 24/7 supervision. CSW contacted patient's daughter [name] and made her aware of above. [name] is agreeable for patient to D/C home. RN case manager aware of above. CSW will continue to follow and assist as needed.   Employment status:  Retired Forensic scientist:  Medicare PT Recommendations:  [place] / Referral to community resources:  Other (Comment Required)(Patient refused SNF and will D/C home with home health. )  Patient/Family's  Response to care:  Patient refused SNF.   Patient/Family's Understanding of and Emotional Response to Diagnosis, Current Treatment, and Prognosis:  Patient and her daughter were pleasant and thanked CSW for assistance.   Emotional Assessment Appearance:  Appears stated age Attitude/Demeanor/Rapport:    Affect (typically observed):  Pleasant Orientation:  Oriented to Place, Oriented to Self, Oriented to  Time, Oriented to Situation Alcohol / Substance use:  Not Applicable Psych involvement (Current and /or in the community):  No (Comment)  Discharge Needs  Concerns to be addressed:  No discharge needs identified Readmission within the last 30 days:  No Current discharge risk:  None Barriers to Discharge:  Continued Medical Work up   [employer], Veronia Beets, LCSW 08/01/2018, 12:09 PM

## 2018-08-01 NOTE — NC FL2 (Signed)
[place] LEVEL OF CARE SCREENING TOOL     IDENTIFICATION  Patient Name: [name] Birthdate: 04-28-1925 Sex: female Admission Date (Current Location): 07/29/2018  [place] and [place] Number:  Engineering geologist and Address:  [facility], [address] [place], [place]  [address]      Provider Number: [id number]  Attending Physician Name and Address:  Gladstone Lighter, MD  Relative Name and Phone Number:       Current Level of Care: Hospital Recommended Level of Care: [place] Prior Approval Number:    Date Approved/Denied:   PASRR Number: ([id number])  Discharge Plan: SNF    Current Diagnoses: Patient Active Problem List   Diagnosis Date Noted  . Pressure injury of skin 07/29/2018  . Cervical lymphadenopathy 07/18/2018  . Pain in right hip 07/18/2018  . Acute recurrent sinusitis 06/25/2018  . Lymphadenopathy 06/25/2018  . Stomatitis and mucositis 06/25/2018  . Chest pain 02/16/2018  . Shortness of breath 02/16/2018  . Other fatigue 02/16/2018  . Arm fracture, left 01/03/2018  . Arthritis 01/03/2018  . Compression fracture of thoracic vertebra ([place]) 01/03/2018  . Emphysema lung ([place]) 01/03/2018  . GERD (gastroesophageal reflux disease) 01/03/2018  . GI bleeding 01/03/2018  . Lumbar spondylosis 01/03/2018  . Menstrual problem 01/03/2018  . Osteoarthritis of knee 01/03/2018  . Osteoporosis 01/03/2018  . Acute pain of right knee 01/03/2018  . Ankle sprain 01/03/2018  . Synovial cyst of popliteal space 01/03/2018  . History of urinary retention 11/10/2017  . Generalized anxiety disorder 10/24/2017  . Constipation 10/24/2017  . Internal hemorrhoids 10/24/2017  . Essential (primary) hypertension 10/24/2017  . Thrombocytopenia ([place]) 10/24/2017  . Acute encephalopathy 08/04/2017  . Hyponatremia 12/15/2015    Orientation RESPIRATION BLADDER Height & Weight     Self, Time, Situation, Place  Normal Incontinent Weight: 133 lb (60.3 kg) Height:  5' (152.4 cm)  BEHAVIORAL SYMPTOMS/MOOD NEUROLOGICAL BOWEL NUTRITION STATUS      Continent Diet(Diet: Heart Healthy )  AMBULATORY STATUS COMMUNICATION OF NEEDS Skin   Extensive Assist Verbally PU Stage and Appropriate Care(pressure ulcer stage 1 on coccyx. )                       Personal Care Assistance Level of Assistance  Bathing, Feeding, Dressing Bathing Assistance: Limited assistance Feeding assistance: Independent Dressing Assistance: Limited assistance     Functional Limitations Info  Sight, Hearing, Speech Sight Info: Adequate Hearing Info: Impaired Speech Info: Adequate    SPECIAL CARE FACTORS FREQUENCY  PT (By licensed PT), OT (By licensed OT)     PT Frequency: (5) OT Frequency: (5)            Contractures      Additional Factors Info  Code Status, Allergies Code Status Info: (Full Code. ) Allergies Info: (Ceftin Cefuroxime, Cortisone, Fluticasone, Levofloxacin, Meperidine, Morphine And Related, Morphine Sulfate, Nisoldipine, Penicillin G Potassium In D5w, Prednisone, Sulfa Antibiotics)           Current Medications (08/01/2018):  This is the current hospital active medication list Current Facility-Administered Medications  Medication Dose Route Frequency Provider Last Rate Last Dose  . acetaminophen (TYLENOL) tablet 650 mg  650 mg Oral Q6H PRN Saundra Shelling, MD   650 mg at 07/31/18 0601   Or  . acetaminophen (TYLENOL) suppository 650 mg  650 mg Rectal Q6H PRN Pyreddy, Reatha Harps, MD      . amLODipine (NORVASC) tablet 5 mg  5 mg Oral  Daily Saundra Shelling, MD   5 mg at 08/01/18 0915  . carvedilol (COREG) tablet 6.25 mg  6.25 mg Oral BID WC Pyreddy, Reatha Harps, MD   6.25 mg at 08/01/18 0759  . cephALEXin (KEFLEX) capsule 250 mg  250 mg Oral Q8H Pyreddy, Reatha Harps, MD   250 mg at 08/01/18 0509  . enoxaparin (LOVENOX) injection 40 mg  40 mg Subcutaneous Q24H Pyreddy, Reatha Harps, MD   40 mg at 07/31/18 2243  .  furosemide (LASIX) tablet 20 mg  20 mg Oral Daily Gladstone Lighter, MD   20 mg at 08/01/18 0915  . HYDROcodone-acetaminophen (NORCO) 10-325 MG per tablet 1 tablet  1 tablet Oral Q4H Gladstone Lighter, MD   1 tablet at 08/01/18 0914  . LORazepam (ATIVAN) tablet 0.5 mg  0.5 mg Oral TID PRN Saundra Shelling, MD   0.5 mg at 07/31/18 2243  . mirtazapine (REMERON) tablet 30 mg  30 mg Oral QHS Saundra Shelling, MD   30 mg at 07/31/18 2243  . morphine 2 MG/ML injection 1 mg  1 mg Intravenous Once Harrie Foreman, MD      . mupirocin ointment (BACTROBAN) 2 % 1 application  1 application Nasal BID Saundra Shelling, MD   1 application at 66/06/00 0913  . nystatin (MYCOSTATIN) 100000 UNIT/ML suspension 500,000 Units  5 mL Mouth/Throat QID Lance Coon, MD   500,000 Units at 08/01/18 0913  . ondansetron (ZOFRAN) tablet 4 mg  4 mg Oral Q6H PRN Saundra Shelling, MD       Or  . ondansetron (ZOFRAN) injection 4 mg  4 mg Intravenous Q6H PRN Pyreddy, Reatha Harps, MD      . oxybutynin (DITROPAN) tablet 2.5 mg  2.5 mg Oral Q8H PRN Gladstone Lighter, MD      . senna-docusate (Senokot-S) tablet 1 tablet  1 tablet Oral QHS PRN Saundra Shelling, MD      . tamsulosin (FLOMAX) capsule 0.4 mg  0.4 mg Oral Daily Pyreddy, Reatha Harps, MD   0.4 mg at 07/31/18 2243     Discharge Medications: Please see discharge summary for a list of discharge medications.  Relevant Imaging Results:  Relevant Lab Results:   Additional Information (SSN: [id number])  Karia Ehresman, Veronia Beets, LCSW

## 2018-08-01 NOTE — Discharge Summary (Addendum)
[facility] [place] NAME: [name]    MR#:  [id number]  DATE OF BIRTH:  [birth year]  DATE OF ADMISSION:  07/29/2018   ADMITTING PHYSICIAN: Saundra Shelling, MD  DATE OF DISCHARGE:  08/01/18  PRIMARY CARE PHYSICIAN: Lavera Guise, MD   ADMISSION DIAGNOSIS:   Dysuria [R30.0] Hyponatremia [E87.1]  DISCHARGE DIAGNOSIS:   Active Problems:   Hyponatremia   Pressure injury of skin   SECONDARY DIAGNOSIS:   Past Medical History:  Diagnosis Date  . Allergy    environmental  . Anxiety   . Chronic back pain   . High cholesterol   . History of urinary retention   . Hypertension   . Migraines   . Osteoporosis   . Pneumonia   . Sinusitis     HOSPITAL COURSE:   84 year old female from home with past medical history significant for hypertension, osteoporosis, low back pain, recurrent UTIs presents to hospital secondary to increased urinary frequency and weakness.  1.  Hyponatremia-prior history of hyponatremia noted as well.   -  hypovolemic in nature.  Improved with IV fluids and monitor - History of polydipsia without eating solid food which can cause hyponatremia -Restarted lasix- but only PRN for now.  2.  Recent UTI-on oral Keflex- finished the course by today -Repeat UA here on admission did not show any infection.  3.  Anemia of chronic disease-stable.  No indication for transfusion.  4.  Hypertension-Norvasc, Coreg  5.  Chronic low back pain-continue her home pain meds  6. Chronic anxiety- on ativan prn- Palliative care after discharge  Physical therapy consulted- home health at discharge   DISCHARGE CONDITIONS:   Guarded ' CONSULTS OBTAINED:   None  DRUG ALLERGIES:   Allergies  Allergen Reactions  . Ceftin [Cefuroxime]   . Cortisone Other (See Comments)    Headache   . Fluticasone Swelling    Facial  . Levofloxacin Nausea And Vomiting  . Meperidine Other (See Comments)  . Morphine  And Related Other (See Comments)    headache  . Morphine Sulfate   . Nisoldipine   . Penicillin G Potassium In D5w   . Prednisone Other (See Comments)    Headache   . Sulfa Antibiotics Nausea Only   DISCHARGE MEDICATIONS:   Allergies as of 08/01/2018      Reactions   Ceftin [cefuroxime]    Cortisone Other (See Comments)   Headache   Fluticasone Swelling   Facial   Levofloxacin Nausea And Vomiting   Meperidine Other (See Comments)   Morphine And Related Other (See Comments)   headache   Morphine Sulfate    Nisoldipine    Penicillin G Potassium In D5w    Prednisone Other (See Comments)   Headache   Sulfa Antibiotics Nausea Only      Medication List    STOP taking these medications   cephALEXin 500 MG capsule Commonly known as:  KEFLEX     TAKE these medications   amLODipine 5 MG tablet Commonly known as:  NORVASC Take 1 tablet (5 mg total) by mouth daily.   carvedilol 6.25 MG tablet Commonly known as:  COREG Take 1 tablet (6.25 mg total) by mouth 2 (two) times daily with a meal.   furosemide 20 MG tablet Commonly known as:  LASIX Take 1 tablet po QD prn   HYDROcodone-acetaminophen 10-325 MG tablet Commonly known as:  NORCO Take 1 tablet by mouth every 4 (four) hours as  needed for moderate pain.   LORazepam 0.5 MG tablet Commonly known as:  ATIVAN Take 1 tablet (0.5 mg total) by mouth 3 (three) times daily as needed for anxiety.   mirtazapine 30 MG tablet Commonly known as:  REMERON Take 1 tablet (30 mg total) by mouth at bedtime.   mupirocin ointment 2 % Commonly known as:  BACTROBAN Place 1 application into the nose 2 (two) times daily.   oxybutynin 5 MG tablet Commonly known as:  DITROPAN Take 1 tablet (5 mg total) by mouth 2 (two) times daily as needed for bladder spasms.   tamsulosin 0.4 MG Caps capsule Commonly known as:  FLOMAX Take 1 capsule (0.4 mg total) by mouth daily.        DISCHARGE INSTRUCTIONS:   1. PCP f/u in 1-2  weeks  DIET:   Cardiac diet  ACTIVITY:   Activity as tolerated  OXYGEN:   Home Oxygen: No.  Oxygen Delivery: room air  DISCHARGE LOCATION:   home   If you experience worsening of your admission symptoms, develop shortness of breath, life threatening emergency, suicidal or homicidal thoughts you must seek medical attention immediately by calling 911 or calling your MD immediately  if symptoms less severe.  You Must read complete instructions/literature along with all the possible adverse reactions/side effects for all the Medicines you take and that have been prescribed to you. Take any new Medicines after you have completely understood and accpet all the possible adverse reactions/side effects.   Please note  You were cared for by a hospitalist during your hospital stay. If you have any questions about your discharge medications or the care you received while you were in the hospital after you are discharged, you can call the unit and asked to speak with the hospitalist on call if the hospitalist that took care of you is not available. Once you are discharged, your primary care physician will handle any further medical issues. Please note that NO REFILLS for any discharge medications will be authorized once you are discharged, as it is imperative that you return to your primary care physician (or establish a relationship with a primary care physician if you do not have one) for your aftercare needs so that they can reassess your need for medications and monitor your lab values.    On the day of Discharge:  VITAL SIGNS:   Blood pressure 118/79, pulse 90, temperature 98.2 F (36.8 C), temperature source Oral, resp. rate 17, height 5' (1.524 m), weight 60.3 kg, SpO2 95 %.  PHYSICAL EXAMINATION:    GENERAL:  84 y.o.-year-old elderly ill built and ill nourished patient lying in the bed with no acute distress. VERY HARD OF HEARING EYES: Pupils equal, round, reactive to light and  accommodation. No scleral icterus. Extraocular muscles intact.  -Decreased visual acuity HEENT: Head atraumatic, normocephalic. Oropharynx and nasopharynx clear.  NECK:  Supple, no jugular venous distention. No thyroid enlargement, no tenderness.  LUNGS: Normal breath sounds bilaterally, no wheezing, rales,rhonchi or crepitation. No use of accessory muscles of respiration. Decreased at the bases CARDIOVASCULAR: S1, S2 normal. No  rubs, or gallops. 2/6 systolic murmur present ABDOMEN: Soft, nontender, nondistended. Bowel sounds present. No organomegaly or mass.  EXTREMITIES: No pedal edema, cyanosis, or clubbing.  NEUROLOGIC: Cranial nerves II through XII are intact. Muscle strength 5/5 in all extremities. Sensation intact. Gait not checked. global weakness  PSYCHIATRIC: The patient is alert and oriented x 3. Anxious lady SKIN: No obvious rash, lesion, or ulcer. [name]  DATA REVIEW:   CBC Recent Labs  Lab 07/30/18 0556  WBC 7.7  HGB 11.4*  HCT 34.6*  PLT 195    Chemistries  Recent Labs  Lab 07/29/18 1046  08/01/18 0403  NA 124*   < > 133*  K 4.2   < > 4.0  CL 92*   < > 103  CO2 26   < > 26  GLUCOSE 103*   < > 95  BUN 9   < > 10  CREATININE 0.38*   < > 0.48  CALCIUM 9.4   < > 8.9  AST 31  --   --   ALT 26  --   --   ALKPHOS 89  --   --   BILITOT 1.0  --   --    < > = values in this interval not displayed.     Microbiology Results  Results for orders placed or performed during the hospital encounter of 02/14/18  Urine culture     Status: None   Collection Time: 02/14/18  6:39 PM  Result Value Ref Range Status   Specimen Description   Final    URINE, RANDOM Performed at [facility] [address], [place], [place] [address]    Special Requests   Final    NONE Performed at [facility], [address] [place], [place] [address]    Culture   Final    NO GROWTH Performed at [facility], [place] [address] [place], [place] [address]     Report Status 02/16/2018 FINAL  Final    RADIOLOGY:  No results found.   Management plans discussed with the patient, family and they are in agreement.  CODE STATUS:     Code Status Orders  (From admission, onward)         Start     Ordered   07/29/18 1540  Full code  Continuous     07/29/18 1539        Code Status History    Date Active Date Inactive Code Status Order ID Comments User Context   08/04/2017 1650 08/07/2017 1850 Full Code [id number]  Bettey Costa, MD Inpatient   08/04/2017 1547 08/04/2017 1650 Full Code [id number]  Bettey Costa, MD ED   12/15/2015 0814 12/19/2015 1632 Full Code [id number]  Saundra Shelling, MD Inpatient    Advance Directive Documentation     Most Recent Value  Type of Advance Directive  Healthcare Power of Attorney  Pre-existing out of facility DNR order (yellow form or pink MOST form)  -  "MOST" Form in Place?  -      TOTAL TIME TAKING CARE OF THIS PATIENT: 38  minutes.    Prescious Hurless M.D on 08/01/2018 at 1:35 PM  Between 7am to 6pm - Pager - [phone]  After 6pm go to [email or web address] - password [email or web address] [facility]  [employer] [facility]  Office  [phone]  CC: Primary care physician; Lavera Guise, MD   Note: This dictation was prepared with Dragon dictation along with smaller phrase technology. Any transcriptional errors that result from this process are unintentional.

## 2018-08-01 NOTE — Progress Notes (Signed)
Discharge instructions and prescription given to pt. IV removed. Daughter at bedside. Pt dressed and will discharge home with daughter [name]. No questions from pt or family at this time.

## 2018-08-02 ENCOUNTER — Other Ambulatory Visit: Payer: Self-pay | Admitting: Internal Medicine

## 2018-08-04 ENCOUNTER — Ambulatory Visit: Payer: Self-pay | Admitting: Nurse Practitioner

## 2018-08-08 ENCOUNTER — Ambulatory Visit: Payer: Self-pay | Admitting: Adult Health

## 2018-08-10 ENCOUNTER — Telehealth: Payer: Self-pay

## 2018-08-10 NOTE — Telephone Encounter (Signed)
Pt called wanting an antibiotic for a sore that is in her nose, advised pt that she needs to be seen, pt stated she could not come in and would like a rx to be sent in, informed the pt that we can not send in anything without her being seen that she can be seen at urgent care or the emergency room, pt stated she will give [place] a call back if she want to make an appointment.

## 2018-08-12 ENCOUNTER — Emergency Department: Payer: Medicare Other

## 2018-08-12 ENCOUNTER — Other Ambulatory Visit: Payer: Self-pay

## 2018-08-12 ENCOUNTER — Emergency Department
Admission: EM | Admit: 2018-08-12 | Discharge: 2018-08-12 | Disposition: A | Discharge status: Home / Self Care | Payer: Medicare Other | Attending: Emergency Medicine | Admitting: Emergency Medicine

## 2018-08-12 DIAGNOSIS — E7800 Pure hypercholesterolemia, unspecified: Secondary | ICD-10-CM | POA: Insufficient documentation

## 2018-08-12 DIAGNOSIS — R531 Weakness: Secondary | ICD-10-CM | POA: Diagnosis not present

## 2018-08-12 DIAGNOSIS — M6281 Muscle weakness (generalized): Secondary | ICD-10-CM | POA: Diagnosis not present

## 2018-08-12 DIAGNOSIS — Z79899 Other long term (current) drug therapy: Secondary | ICD-10-CM | POA: Insufficient documentation

## 2018-08-12 DIAGNOSIS — R1010 Upper abdominal pain, unspecified: Secondary | ICD-10-CM | POA: Insufficient documentation

## 2018-08-12 DIAGNOSIS — I10 Essential (primary) hypertension: Secondary | ICD-10-CM | POA: Diagnosis not present

## 2018-08-12 DIAGNOSIS — N289 Disorder of kidney and ureter, unspecified: Secondary | ICD-10-CM | POA: Diagnosis not present

## 2018-08-12 DIAGNOSIS — F411 Generalized anxiety disorder: Secondary | ICD-10-CM

## 2018-08-12 DIAGNOSIS — R339 Retention of urine, unspecified: Secondary | ICD-10-CM | POA: Diagnosis not present

## 2018-08-12 DIAGNOSIS — R5383 Other fatigue: Secondary | ICD-10-CM | POA: Diagnosis not present

## 2018-08-12 DIAGNOSIS — R631 Polydipsia: Secondary | ICD-10-CM | POA: Diagnosis present

## 2018-08-12 LAB — URINALYSIS, COMPLETE (UACMP) WITH MICROSCOPIC
BACTERIA UA: NONE SEEN
BILIRUBIN URINE: NEGATIVE
GLUCOSE, UA: NEGATIVE mg/dL
Ketones, ur: NEGATIVE mg/dL
LEUKOCYTES UA: NEGATIVE
NITRITE: NEGATIVE
Protein, ur: NEGATIVE mg/dL
Specific Gravity, Urine: 1.004 — ABNORMAL LOW (ref 1.005–1.030)
WBC, UA: NONE SEEN WBC/hpf (ref 0–5)
pH: 8 (ref 5.0–8.0)

## 2018-08-12 LAB — BASIC METABOLIC PANEL
ANION GAP: 8 (ref 5–15)
BUN: 13 mg/dL (ref 8–23)
CHLORIDE: 101 mmol/L (ref 98–111)
CO2: 27 mmol/L (ref 22–32)
Calcium: 10.1 mg/dL (ref 8.9–10.3)
Creatinine, Ser: 0.45 mg/dL (ref 0.44–1.00)
Glucose, Bld: 95 mg/dL (ref 70–99)
POTASSIUM: 4 mmol/L (ref 3.5–5.1)
SODIUM: 136 mmol/L (ref 135–145)

## 2018-08-12 LAB — CBC
HCT: 40.4 % (ref 36.0–46.0)
HEMOGLOBIN: 13.1 g/dL (ref 12.0–15.0)
MCH: 27.9 pg (ref 26.0–34.0)
MCHC: 32.4 g/dL (ref 30.0–36.0)
MCV: 86 fL (ref 80.0–100.0)
NRBC: 0 % (ref 0.0–0.2)
Platelets: 282 10*3/uL (ref 150–400)
RBC: 4.7 MIL/uL (ref 3.87–5.11)
RDW: 12.8 % (ref 11.5–15.5)
WBC: 7.5 10*3/uL (ref 4.0–10.5)

## 2018-08-12 LAB — TROPONIN I

## 2018-08-12 MED ORDER — LORAZEPAM 0.5 MG PO TABS
0.5000 mg | ORAL_TABLET | Freq: Once | ORAL | Status: AC
  Administered 2018-08-12: 0.5 mg via ORAL
  Filled 2018-08-12: qty 1

## 2018-08-12 MED ORDER — HYDROCODONE-ACETAMINOPHEN 5-325 MG PO TABS
2.0000 | ORAL_TABLET | Freq: Once | ORAL | Status: AC
  Administered 2018-08-12: 2 via ORAL
  Filled 2018-08-12: qty 2

## 2018-08-12 MED ORDER — LORAZEPAM 0.5 MG PO TABS: 0.5000 mg | ORAL_TABLET | Freq: Three times a day (TID) | ORAL | 0 refills | Status: DC | PRN

## 2018-08-12 NOTE — ED Notes (Signed)
Suction catheter in place, pt has had 277mL of urine output at this time

## 2018-08-12 NOTE — ED Notes (Signed)
Leg bag attached to foley cath

## 2018-08-12 NOTE — ED Triage Notes (Signed)
Pt arrives from home via [facility] c/o polydipsia, polyuria, generalized weakness.  Pt continually states she "is sick because she is out of ativan and she is scared." Difficult to communicate d/t Pt hard of hearing. Protocol initiated.

## 2018-08-12 NOTE — ED Notes (Signed)
Called Lab to ask to please run specimens sent.

## 2018-08-12 NOTE — ED Provider Notes (Signed)
[facility] Emergency Department Provider Note   ____________________________________________   First MD Initiated Contact with Patient 08/12/18 1731     (approximate)  I have reviewed the triage vital signs and the nursing notes.   HISTORY  Chief Complaint Frequent urination lower abdominal discomfort    HPI [name] is a 84 y.o. female presents for evaluation of frequent urination  Patient was recently hospitalized with urinary tract infection, she reports she is continued to be thirsty drinking fluids well, but feeling somewhat fatigued, also running back and forth to the bathroom many many times feels like she empties a small amount of her bladder at a time.  Also reports she is having some discomfort in her lower abdomen, daughter reports he supposed to take tamsulosin and night but has not been taking it has had previous issues with having to have catheter.  No fevers or chills.  No nausea or vomiting.  Still drinking well, does not eat too much which is normal.  Not having episodes of chest pain or trouble breathing.  No fevers.  Past Medical History:  Diagnosis Date  . Allergy    environmental  . Anxiety   . Chronic back pain   . High cholesterol   . History of urinary retention   . Hypertension   . Migraines   . Osteoporosis   . Pneumonia   . Sinusitis     Patient Active Problem List   Diagnosis Date Noted  . Pressure injury of skin 07/29/2018  . Cervical lymphadenopathy 07/18/2018  . Pain in right hip 07/18/2018  . Acute recurrent sinusitis 06/25/2018  . Lymphadenopathy 06/25/2018  . Stomatitis and mucositis 06/25/2018  . Chest pain 02/16/2018  . Shortness of breath 02/16/2018  . Other fatigue 02/16/2018  . Arm fracture, left 01/03/2018  . Arthritis 01/03/2018  . Compression fracture of thoracic vertebra ([place]) 01/03/2018  . Emphysema lung ([place]) 01/03/2018  . GERD (gastroesophageal reflux disease) 01/03/2018  . GI  bleeding 01/03/2018  . Lumbar spondylosis 01/03/2018  . Menstrual problem 01/03/2018  . Osteoarthritis of knee 01/03/2018  . Osteoporosis 01/03/2018  . Acute pain of right knee 01/03/2018  . Ankle sprain 01/03/2018  . Synovial cyst of popliteal space 01/03/2018  . History of urinary retention 11/10/2017  . Generalized anxiety disorder 10/24/2017  . Constipation 10/24/2017  . Internal hemorrhoids 10/24/2017  . Essential (primary) hypertension 10/24/2017  . Thrombocytopenia ([place]) 10/24/2017  . Acute encephalopathy 08/04/2017  . Hyponatremia 12/15/2015    Past Surgical History:  Procedure Laterality Date  . ABDOMINAL HYSTERECTOMY    . APPENDECTOMY    . BACK SURGERY    . BREAST BIOPSY Right 2002   neg  . HEMORRHOID SURGERY    . TONSILLECTOMY      Prior to Admission medications   Medication Sig Start Date End Date Taking? Authorizing Provider  amLODipine (NORVASC) 5 MG tablet Take 1 tablet (5 mg total) by mouth daily. 04/28/18   Lavera Guise, MD  carvedilol (COREG) 6.25 MG tablet Take 1 tablet (6.25 mg total) by mouth 2 (two) times daily with a meal. 08/03/18   Lavera Guise, MD  furosemide (LASIX) 20 MG tablet Take 1 tablet po QD prn 04/28/18   Lavera Guise, MD  HYDROcodone-acetaminophen [facility]) 10-325 MG tablet Take 1 tablet by mouth every 4 (four) hours as needed for moderate pain.     [provider]  LORazepam (ATIVAN) 0.5 MG tablet Take 1 tablet (0.5 mg total)  by mouth 3 (three) times daily as needed for anxiety. 08/12/18   Delman Kitten, MD  mirtazapine (REMERON) 30 MG tablet Take 1 tablet (30 mg total) by mouth at bedtime. 06/16/18   Ronnell Freshwater, NP  mupirocin ointment (BACTROBAN) 2 % Place 1 application into the nose 2 (two) times daily. 03/15/18   Ronnell Freshwater, NP  oxybutynin (DITROPAN) 5 MG tablet Take 1 tablet (5 mg total) by mouth 2 (two) times daily as needed for bladder spasms. 08/01/18   Gladstone Lighter, MD  tamsulosin (FLOMAX) 0.4 MG CAPS capsule  Take 1 capsule (0.4 mg total) by mouth daily. 04/21/18   Zara Council A, PA-C    Allergies Ceftin [cefuroxime]; Cortisone; Fluticasone; Levofloxacin; Meperidine; Morphine and related; Morphine sulfate; Nisoldipine; Penicillin g potassium in d5w; Prednisone; and Sulfa antibiotics  Family History  Problem Relation Age of Onset  . Breast cancer Sister 34  . Breast cancer Maternal Grandmother 79  . Breast cancer Sister 49  . Bladder Cancer Brother   . Hypertension Neg Hx   . Kidney cancer Neg Hx     Social History Social History   Tobacco Use  . Smoking status: Never Smoker  . Smokeless tobacco: Never Used  Substance Use Topics  . Alcohol use: No  . Drug use: No    Review of Systems Constitutional: No fever/chills Eyes: No visual changes. ENT: No sore throat. Cardiovascular: Denies chest pain. Respiratory: Denies shortness of breath. Gastrointestinal: No abdominal pain.   Genitourinary: Negative for dysuria. Musculoskeletal: Negative for back pain. Skin: Negative for rash. Neurological: Negative for headaches, areas of focal weakness or numbness.    ____________________________________________   PHYSICAL EXAM:  VITAL SIGNS: ED Triage Vitals  Enc Vitals Group     BP 08/12/18 1730 130/82     Pulse Rate 08/12/18 1730 79     Resp 08/12/18 1730 13     Temp 08/12/18 1717 98.3 F (36.8 C)     Temp Source 08/12/18 1717 Oral     SpO2 08/12/18 1730 95 %     Weight 08/12/18 1718 138 lb 14.2 oz (63 kg)     Height 08/12/18 1718 5' (1.524 m)     Head Circumference --      Peak Flow --      Pain Score 08/12/18 1717 0     Pain Loc --      Pain Edu? --      Excl. in [place]? --     Constitutional: Alert and oriented. Well appearing and in no acute distress. Eyes: Conjunctivae are normal. Head: Atraumatic. Nose: No congestion/rhinnorhea. Mouth/Throat: Mucous membranes are moist. Neck: No stridor.  Cardiovascular: Normal rate, regular rhythm. Grossly normal heart  sounds.  Good peripheral circulation. Respiratory: Normal respiratory effort.  No retractions. Lungs CTAB. Gastrointestinal: Soft and nontender except in the suprapubic region where there is some distention and palpable mass overlying the region where the bladder would be expected, is mildly tender to palpation. No distention. Musculoskeletal: No lower extremity tenderness nor edema. Neurologic:  Normal speech and language. No gross focal neurologic deficits are appreciated.  Skin:  Skin is warm, dry and intact. No rash noted. Psychiatric: Mood and affect are normal. Speech and behavior are normal.  ____________________________________________   LABS (all labs ordered are listed, but only abnormal results are displayed)  Labs Reviewed  URINALYSIS, COMPLETE (UACMP) WITH MICROSCOPIC - Abnormal; Notable for the following components:      Result Value   Color, Urine STRAW (*)  APPearance CLEAR (*)    Specific Gravity, Urine 1.004 (*)    Hgb urine dipstick SMALL (*)    All other components within normal limits  BASIC METABOLIC PANEL  CBC  TROPONIN I   ____________________________________________  EKG  Reviewed enterotomy at 1710 Heart rate 80 QRS 110 QTc 440 Normal sinus rhythm, possible old anteroseptal MI.  No evidence of acute ischemic change ____________________________________________  RADIOLOGY  Dg Chest 2 View  IMPRESSION: No active cardiopulmonary disease. Electronically Signed   By: Titus Dubin M.D.   On: 08/12/2018 19:21   Ct Renal Stone Study  IMPRESSION: Over distended bladder with fullness of the collecting systems bilaterally. No mass is seen. Cystic lesions within the kidneys. A hyperdense focus is noted stable from the previous exam. This could not be confirmed as a cystic lesion. Its stability suggests a hyperdense cyst although the possibility of solid neoplasm cannot be totally excluded. Nonemergent MRI can be performed as clinically indicated. Chronic  changes as described above. Electronically Signed   By: Inez Catalina M.D.   On: 08/12/2018 18:56    ____________________________________________   PROCEDURES  Procedure(s) performed: None  Procedures  Critical Care performed: No  ____________________________________________   INITIAL IMPRESSION / ASSESSMENT AND PLAN / ED COURSE  Pertinent labs & imaging results that were available during my care of the patient were reviewed by me and considered in my medical decision making (see chart for details).   Patient returns for evaluation of fatigue, frequent urination.  Recent urinary tract infection.  On examination, has some distention in the lower abdomen, concerning for outlet obstruction.  Is a history of urinary retention in the past.  Urinalysis demonstrates no evidence of infection, lab work and mental status reassuring.  She is awake and alert very hard of hearing, but otherwise doing well.  However she has been weaned off of Ativan recently, and family reports she frequently suffers from anxiety, discussed with Estill Bakes will be willing to provide him short prescription for 3 days of additional small dose Ativan for her long-standing anxiety.  No acute neurologic vascular cardiac pulmonary symptoms.  Afebrile, no evidence of infection.  She remains ambulatory at home but is frequently having to run back and forth between bed and bathroom  Clinical Course as of Aug 13 2035  Sat Aug 12, 2018  1943 Discussed with family lab results, CT result.  Place Foley catheter, has a history of urinary retention in the past and I suspect her symptoms of lower abdominal discomfort and frequent urination are due to urinary retention at this time.  Preserved renal function.   [MQ]  1951 Foley clamped after about 1 L of clear urine drained.   [MQ]  2035 Patient has chronic low back pain takes 10 mg hydrocodone every 6 hours at home, confirmed by chart review.  She is awake and alert but reports her back is  starting to hurt quite a bit and she is due for her medicine her last dose was about 2 PM today.  We will give her home dose of hydrocodone bladder is draining well Foley catheter draining well.  Patient awake and alert will discharge to home with Foley catheter, follow-up with urology and primary care.   [MQ]    Clinical Course User Index [MQ] Delman Kitten, MD    ----------------------------------------- 7:57 PM on 08/12/2018 -----------------------------------------  Patient resting comfortably now, reports she feels much better.  Daughter at bedside, plan to follow-up with [facility] urologic this week, careful return  precautions discussed with patient and family.  She appears comfortable and much improved.  Anticipate discharge to home once bladder drainage completed.  Patient and daughter both confirm that she has lost a couple of her Ativan tablets, her daughter now administers to them so she does not lose them here in the last week.  Will discharge with very short prescription of Ativan, will follow closely with PCP.  Return precautions and treatment recommendations and follow-up discussed with the patient who is agreeable with the plan.  Patient does not drive.  She is fully awake alert ____________________________________________   FINAL CLINICAL IMPRESSION(S) / ED DIAGNOSES  Final diagnoses:  Urinary retention        Note:  This document was prepared using Dragon voice recognition software and may include unintentional dictation errors       Delman Kitten, MD 08/12/18 2037

## 2018-08-12 NOTE — ED Notes (Signed)
Called Lab to ask again to please run specimens sent.

## 2018-08-14 ENCOUNTER — Telehealth: Payer: Self-pay | Admitting: Urology

## 2018-08-14 NOTE — Telephone Encounter (Signed)
Call pt's daughter Susan's cell [phone] or work [phone] ext [phone]

## 2018-08-14 NOTE — Telephone Encounter (Signed)
Spoke to susan and patient is scheduled for TOV. Patient is to continue the Tamsulosin.

## 2018-08-14 NOTE — Telephone Encounter (Signed)
Pt was seen in ER over the weekend and daughter has some questions.  She had a foley placed.

## 2018-08-22 ENCOUNTER — Other Ambulatory Visit: Payer: Self-pay

## 2018-08-22 ENCOUNTER — Emergency Department: Payer: Medicare Other

## 2018-08-22 ENCOUNTER — Emergency Department
Admission: EM | Admit: 2018-08-22 | Discharge: 2018-08-22 | Disposition: A | Discharge status: Home / Self Care | Payer: Medicare Other | Attending: Emergency Medicine | Admitting: Emergency Medicine

## 2018-08-22 ENCOUNTER — Encounter: Payer: Self-pay | Admitting: Emergency Medicine

## 2018-08-22 DIAGNOSIS — I10 Essential (primary) hypertension: Secondary | ICD-10-CM | POA: Insufficient documentation

## 2018-08-22 DIAGNOSIS — R1032 Left lower quadrant pain: Secondary | ICD-10-CM | POA: Diagnosis not present

## 2018-08-22 DIAGNOSIS — I440 Atrioventricular block, first degree: Secondary | ICD-10-CM | POA: Diagnosis not present

## 2018-08-22 DIAGNOSIS — Z79899 Other long term (current) drug therapy: Secondary | ICD-10-CM | POA: Insufficient documentation

## 2018-08-22 DIAGNOSIS — R1084 Generalized abdominal pain: Secondary | ICD-10-CM | POA: Diagnosis not present

## 2018-08-22 DIAGNOSIS — K5900 Constipation, unspecified: Secondary | ICD-10-CM | POA: Diagnosis not present

## 2018-08-22 LAB — URINALYSIS, COMPLETE (UACMP) WITH MICROSCOPIC
Bilirubin Urine: NEGATIVE
Glucose, UA: NEGATIVE mg/dL
Hgb urine dipstick: NEGATIVE
KETONES UR: NEGATIVE mg/dL
LEUKOCYTES UA: NEGATIVE
Nitrite: NEGATIVE
Protein, ur: NEGATIVE mg/dL
SPECIFIC GRAVITY, URINE: 1.002 — AB (ref 1.005–1.030)
SQUAMOUS EPITHELIAL / LPF: NONE SEEN (ref 0–5)
WBC, UA: NONE SEEN WBC/hpf (ref 0–5)
pH: 7 (ref 5.0–8.0)

## 2018-08-22 LAB — COMPREHENSIVE METABOLIC PANEL
ALBUMIN: 4.2 g/dL (ref 3.5–5.0)
ALT: 15 U/L (ref 0–44)
AST: 25 U/L (ref 15–41)
Alkaline Phosphatase: 73 U/L (ref 38–126)
Anion gap: 8 (ref 5–15)
BILIRUBIN TOTAL: 1.1 mg/dL (ref 0.3–1.2)
BUN: 10 mg/dL (ref 8–23)
CO2: 26 mmol/L (ref 22–32)
CREATININE: 0.51 mg/dL (ref 0.44–1.00)
Calcium: 10.1 mg/dL (ref 8.9–10.3)
Chloride: 99 mmol/L (ref 98–111)
GFR calc Af Amer: >60 mL/min (ref >60)
GFR calc non Af Amer: >60 mL/min (ref >60)
GLUCOSE: 104 mg/dL — AB (ref 70–99)
POTASSIUM: 3.8 mmol/L (ref 3.5–5.1)
Sodium: 133 mmol/L — ABNORMAL LOW (ref 135–145)
Total Protein: 7 g/dL (ref 6.5–8.1)

## 2018-08-22 LAB — CBC WITH DIFFERENTIAL/PLATELET
Abs Immature Granulocytes: 0.02 10*3/uL (ref 0.00–0.07)
BASOS PCT: 1 %
Basophils Absolute: 0.1 10*3/uL (ref 0.0–0.1)
EOS ABS: 0.2 10*3/uL (ref 0.0–0.5)
EOS PCT: 2 %
HEMATOCRIT: 41 % (ref 36.0–46.0)
Hemoglobin: 13.5 g/dL (ref 12.0–15.0)
Immature Granulocytes: 0 %
Lymphocytes Relative: 41 %
Lymphs Abs: 2.8 10*3/uL (ref 0.7–4.0)
MCH: 28.2 pg (ref 26.0–34.0)
MCHC: 32.9 g/dL (ref 30.0–36.0)
MCV: 85.8 fL (ref 80.0–100.0)
MONO ABS: 0.7 10*3/uL (ref 0.1–1.0)
Monocytes Relative: 10 %
NEUTROS PCT: 46 %
Neutro Abs: 3.1 10*3/uL (ref 1.7–7.7)
PLATELETS: 184 10*3/uL (ref 150–400)
RBC: 4.78 MIL/uL (ref 3.87–5.11)
RDW: 13 % (ref 11.5–15.5)
WBC: 6.8 10*3/uL (ref 4.0–10.5)
nRBC: 0 % (ref 0.0–0.2)

## 2018-08-22 LAB — LIPASE, BLOOD: Lipase: 33 U/L (ref 11–51)

## 2018-08-22 MED ORDER — ONDANSETRON HCL 4 MG/2ML IJ SOLN
4.0000 mg | Freq: Once | INTRAMUSCULAR | Status: AC
  Administered 2018-08-22: 4 mg via INTRAVENOUS
  Filled 2018-08-22: qty 2

## 2018-08-22 MED ORDER — IOPAMIDOL (ISOVUE-300) INJECTION 61%
100.0000 mL | Freq: Once | INTRAVENOUS | Status: AC | PRN
  Administered 2018-08-22: 100 mL via INTRAVENOUS

## 2018-08-22 NOTE — Discharge Instructions (Signed)

## 2018-08-22 NOTE — ED Triage Notes (Signed)
Patient from home via [facility]. Per EMS, daughter reports patient has not had bowel movement in 4 days. Abdominal pain LLQ upon palpation. Patient arrives with foley in place. Per daughter, has urology appointment on Friday for follow-up.

## 2018-08-22 NOTE — ED Provider Notes (Signed)
[facility] Emergency Department Provider Note  ____________________________________________  Time seen: Approximately 2:59 PM  I have reviewed the triage vital signs and the nursing notes.   HISTORY  Chief Complaint Constipation and Abdominal Pain   HPI [name] is a 84 y.o. female with history of chronic urinary retention and indwelling Foley catheter, GERD, GI bleed presents for evaluation of abdominal pain and constipation. Patient reports 4 days of constipation. Has tried milk of magnesia with no relief. Today started to have diffuse sharp intermittent abdominal pain and nausea.  No vomiting.  Patient is passing flatus.  No abdominal distention.  She has had several prior abdominal surgeries.  She reports that her pain at this time is very mild.  She denies dysuria or hematuria, fever or chills.  Past Medical History:  Diagnosis Date  . Allergy    environmental  . Anxiety   . Chronic back pain   . High cholesterol   . History of urinary retention   . Hypertension   . Migraines   . Osteoporosis   . Pneumonia   . Sinusitis     Patient Active Problem List   Diagnosis Date Noted  . Pressure injury of skin 07/29/2018  . Cervical lymphadenopathy 07/18/2018  . Pain in right hip 07/18/2018  . Acute recurrent sinusitis 06/25/2018  . Lymphadenopathy 06/25/2018  . Stomatitis and mucositis 06/25/2018  . Chest pain 02/16/2018  . Shortness of breath 02/16/2018  . Other fatigue 02/16/2018  . Arm fracture, left 01/03/2018  . Arthritis 01/03/2018  . Compression fracture of thoracic vertebra ([place]) 01/03/2018  . Emphysema lung ([place]) 01/03/2018  . GERD (gastroesophageal reflux disease) 01/03/2018  . GI bleeding 01/03/2018  . Lumbar spondylosis 01/03/2018  . Menstrual problem 01/03/2018  . Osteoarthritis of knee 01/03/2018  . Osteoporosis 01/03/2018  . Acute pain of right knee 01/03/2018  . Ankle sprain 01/03/2018  . Synovial cyst of popliteal  space 01/03/2018  . History of urinary retention 11/10/2017  . Generalized anxiety disorder 10/24/2017  . Constipation 10/24/2017  . Internal hemorrhoids 10/24/2017  . Essential (primary) hypertension 10/24/2017  . Thrombocytopenia ([place]) 10/24/2017  . Acute encephalopathy 08/04/2017  . Hyponatremia 12/15/2015    Past Surgical History:  Procedure Laterality Date  . ABDOMINAL HYSTERECTOMY    . APPENDECTOMY    . BACK SURGERY    . BREAST BIOPSY Right 2002   neg  . HEMORRHOID SURGERY    . TONSILLECTOMY      Prior to Admission medications   Medication Sig Start Date End Date Taking? Authorizing Provider  amLODipine (NORVASC) 5 MG tablet Take 1 tablet (5 mg total) by mouth daily. 04/28/18  Yes Lavera Guise, MD  carvedilol (COREG) 6.25 MG tablet Take 1 tablet (6.25 mg total) by mouth 2 (two) times daily with a meal. 08/03/18  Yes Lavera Guise, MD  furosemide (LASIX) 20 MG tablet Take 1 tablet po QD prn Patient taking differently: Take 20 mg by mouth daily as needed. Take 1 tablet po QD prn 04/28/18  Yes Lavera Guise, MD  HYDROcodone-acetaminophen [facility]) 10-325 MG tablet Take 1 tablet by mouth every 4 (four) hours as needed for moderate pain.    Yes [provider]  LORazepam (ATIVAN) 0.5 MG tablet Take 1 tablet (0.5 mg total) by mouth 3 (three) times daily as needed for anxiety. 08/12/18  Yes Delman Kitten, MD  mirtazapine (REMERON) 30 MG tablet Take 1 tablet (30 mg total) by mouth at bedtime. 06/16/18  Yes Ronnell Freshwater, NP  mupirocin ointment (BACTROBAN) 2 % Place 1 application into the nose 2 (two) times daily. 03/15/18  Yes Boscia, Greer Ee, NP  tamsulosin (FLOMAX) 0.4 MG CAPS capsule Take 1 capsule (0.4 mg total) by mouth daily. 04/21/18  Yes McGowan, Larene [place] A, PA-C  oxybutynin (DITROPAN) 5 MG tablet Take 1 tablet (5 mg total) by mouth 2 (two) times daily as needed for bladder spasms. Patient not taking: Reported on 08/22/2018 08/01/18   Gladstone Lighter, MD     Allergies Ceftin [cefuroxime]; Cortisone; Fluticasone; Levofloxacin; Meperidine; Morphine and related; Morphine sulfate; Nisoldipine; Penicillin g potassium in d5w; Prednisone; and Sulfa antibiotics  Family History  Problem Relation Age of Onset  . Breast cancer Sister 37  . Breast cancer Maternal Grandmother 25  . Breast cancer Sister 43  . Bladder Cancer Brother   . Hypertension Neg Hx   . Kidney cancer Neg Hx     Social History Social History   Tobacco Use  . Smoking status: Never Smoker  . Smokeless tobacco: Never Used  Substance Use Topics  . Alcohol use: No  . Drug use: No    Review of Systems  Constitutional: Negative for fever. Eyes: Negative for visual changes. ENT: Negative for sore throat. Neck: No neck pain  Cardiovascular: Negative for chest pain. Respiratory: Negative for shortness of breath. Gastrointestinal: + abdominal pain, nausea, and constipation. No vomiting or diarrhea. Genitourinary: Negative for dysuria. Musculoskeletal: Negative for back pain. Skin: Negative for rash. Neurological: Negative for headaches, weakness or numbness. Psych: No SI or HI  ____________________________________________   PHYSICAL EXAM:  VITAL SIGNS: ED Triage Vitals  Enc Vitals Group     BP 08/22/18 1425 122/82     Pulse Rate 08/22/18 1425 99     Resp 08/22/18 1425 18     Temp 08/22/18 1425 98 F (36.7 C)     Temp Source 08/22/18 1425 Oral     SpO2 --      Weight 08/22/18 1426 138 lb 14.2 oz (63 kg)     Height 08/22/18 1426 5' (1.524 m)     Head Circumference --      Peak Flow --      Pain Score 08/22/18 1425 7     Pain Loc --      Pain Edu? --      Excl. in [place]? --     Constitutional: Alert and oriented. Well appearing and in no apparent distress. HEENT:      Head: Normocephalic and atraumatic.         Eyes: Conjunctivae are normal. Sclera is non-icteric.       Mouth/Throat: Mucous membranes are moist.       Neck: Supple with no signs of  meningismus. Cardiovascular: Regular rate and rhythm. No murmurs, gallops, or rubs. 2+ symmetrical distal pulses are present in all extremities. No JVD. Respiratory: Normal respiratory effort. Lungs are clear to auscultation bilaterally. No wheezes, crackles, or rhonchi.  Gastrointestinal: Soft, tender to palpation diffusely, and non distended with positive bowel sounds. No rebound or guarding. Foley catheter in place draining clear urine. Rectal exam showing no impaction, no hemorrhoids, no stool in rectal vault Musculoskeletal: Nontender with normal range of motion in all extremities. No edema, cyanosis, or erythema of extremities. Neurologic: Normal speech and language. Face is symmetric. Moving all extremities. No gross focal neurologic deficits are appreciated. Skin: Skin is warm, dry and intact. No rash noted. Psychiatric: Mood and affect are normal. Speech and  behavior are normal.  ____________________________________________   LABS (all labs ordered are listed, but only abnormal results are displayed)  Labs Reviewed  URINALYSIS, COMPLETE (UACMP) WITH MICROSCOPIC - Abnormal; Notable for the following components:      Result Value   Color, Urine COLORLESS (*)    APPearance CLEAR (*)    Specific Gravity, Urine 1.002 (*)    Bacteria, UA RARE (*)    All other components within normal limits  COMPREHENSIVE METABOLIC PANEL - Abnormal; Notable for the following components:   Sodium 133 (*)    Glucose, Bld 104 (*)    All other components within normal limits  CBC WITH DIFFERENTIAL/PLATELET  LIPASE, BLOOD   ____________________________________________  EKG  ED ECG REPORT I, Rudene Re, the attending physician, personally viewed and interpreted this ECG.  Normal sinus rhythm with first-degree AV block, rate of 92, nonspecific interventricular conduction delay, normal QTC, normal axis, Q waves in anterior leads with no ST elevations or depressions.  Unchanged from  prior. ____________________________________________  RADIOLOGY  I have personally reviewed the images performed during this visit and I agree with the Radiologist's read.   Interpretation by Radiologist:  Ct Abdomen Pelvis W Contrast  Result Date: 08/22/2018 CLINICAL DATA:  Lower quadrant abdominal pain, no bowel movement for 4 days, LEFT lower quadrant pain with palpation, infilling Foley catheter being followed by urology, history hypertension, emphysema EXAM: CT ABDOMEN AND PELVIS WITH CONTRAST TECHNIQUE: Multidetector CT imaging of the abdomen and pelvis was performed using the standard protocol following bolus administration of intravenous contrast. Sagittal and coronal MPR images reconstructed from axial data set. CONTRAST:  165mL ISOVUE-300 IOPAMIDOL (ISOVUE-300) INJECTION 61% IV. No oral contrast. COMPARISON:  08/12/2018 FINDINGS: Lower chest: Minimal bibasilar atelectasis Hepatobiliary: Minimal intrahepatic biliary dilatation centrally, potentially related age. No extrahepatic biliary dilatation. Gallbladder and liver otherwise normal appearance. Pancreas: Normal appearance Spleen: Normal appearance Adrenals/Urinary Tract: Adrenal glands normal appearance. Small upper pole LEFT renal cyst. Kidneys, ureters, and bladder normal appearance. Catheter within bladder. Stomach/Bowel: Appendix surgically absent by history. Stomach decompressed. Bowel loops unremarkable. Vascular/Lymphatic: Atherosclerotic calcifications aorta and iliac arteries. Aorta mildly tortuous but normal in caliber. Mild enlargement of cardiac chambers. Numerous pelvic phleboliths. Reproductive: Uterus surgically absent. Nonvisualization of LEFT ovary. Questionable visualization of a normal sized RIGHT ovary. Other: No free air or free fluid. Questionable small RIGHT inguinal hernia containing fat. Musculoskeletal: Osseous demineralization. Prior lumbar fusion L3-S1 with associated beam hardening artifacts. Anterolisthesis L3-L4.  Superior endplate compression deformity of L5. Lumbar spine finding stable. Pelvis intact. IMPRESSION: No acute intra-abdominal or intrapelvic abnormalities. Questionable small RIGHT inguinal hernia containing fat. Aortic Atherosclerosis (ICD10-I70.0). Electronically Signed   By: Lavonia Dana M.D.   On: 08/22/2018 15:50      ____________________________________________   PROCEDURES  Procedure(s) performed: None Procedures Critical Care performed:  None ____________________________________________   INITIAL IMPRESSION / ASSESSMENT AND PLAN / ED COURSE  84 y.o. female with history of chronic urinary retention and indwelling Foley catheter, GERD, GI bleed presents for evaluation of abdominal pain and constipation.  Abdomen has diffuse tenderness to palpation with no localizing tenderness with positive bowel sounds, no distention. No impacted stool and normal rectal exam.   Foley catheter in place draining very clear urine.  Differential diagnoses including constipation versus SBO versus ileus versus colon cancer versus UTI versus pyelonephritis.  Labs, urinalysis, CT abdomen pelvis are pending.  Clinical Course as of Aug 22 1748  Tue Aug 22, 2018  1649 Labs, CT and UA all negative. Patient  tolerating PO. Will dc home to care of daughter and close f/u with PCP. Discussed standard return precautions with patient and daughter.   [CV]    Clinical Course User Index [CV] Alfred Levins [place], MD     As part of my medical decision making, I reviewed the following data within the [place] notes reviewed and incorporated, Labs reviewed , EKG interpreted , Old chart reviewed, Radiograph reviewed , Notes from prior ED visits and [place] Controlled Substance Database    Pertinent labs & imaging results that were available during my care of the patient were reviewed by me and considered in my medical decision making (see chart for  details).    ____________________________________________   FINAL CLINICAL IMPRESSION(S) / ED DIAGNOSES  Final diagnoses:  Generalized abdominal pain      NEW MEDICATIONS STARTED DURING THIS VISIT:  ED Discharge Orders    None       Note:  This document was prepared using Dragon voice recognition software and may include unintentional dictation errors.    Rudene Re, MD 08/22/18 [phone]

## 2018-08-22 NOTE — ED Notes (Signed)
Patient reports tolerating PO challeneg (beverage and crackers provided). Patient denies pain or nausea at this time. MD Alfred Levins made aware.

## 2018-08-22 NOTE — ED Notes (Signed)
572mL of urine emptied from drainage bag.

## 2018-08-23 ENCOUNTER — Ambulatory Visit (INDEPENDENT_AMBULATORY_CARE_PROVIDER_SITE_OTHER): Payer: Medicare Other | Admitting: Family Medicine

## 2018-08-23 ENCOUNTER — Other Ambulatory Visit: Payer: Self-pay

## 2018-08-23 ENCOUNTER — Observation Stay
Admission: EM | Admit: 2018-08-23 | Discharge: 2018-08-24 | Disposition: A | Discharge status: Home / Self Care | Payer: Medicare Other | Attending: Family Medicine | Admitting: Family Medicine

## 2018-08-23 ENCOUNTER — Encounter: Payer: Self-pay | Admitting: Internal Medicine

## 2018-08-23 ENCOUNTER — Emergency Department: Payer: Medicare Other

## 2018-08-23 VITALS — HR 67 | Temp 97.9°F

## 2018-08-23 DIAGNOSIS — D696 Thrombocytopenia, unspecified: Secondary | ICD-10-CM | POA: Insufficient documentation

## 2018-08-23 DIAGNOSIS — I959 Hypotension, unspecified: Secondary | ICD-10-CM | POA: Diagnosis not present

## 2018-08-23 DIAGNOSIS — K5900 Constipation, unspecified: Secondary | ICD-10-CM | POA: Diagnosis not present

## 2018-08-23 DIAGNOSIS — R339 Retention of urine, unspecified: Secondary | ICD-10-CM | POA: Insufficient documentation

## 2018-08-23 DIAGNOSIS — R262 Difficulty in walking, not elsewhere classified: Secondary | ICD-10-CM | POA: Insufficient documentation

## 2018-08-23 DIAGNOSIS — R51 Headache: Secondary | ICD-10-CM | POA: Diagnosis not present

## 2018-08-23 DIAGNOSIS — Z888 Allergy status to other drugs, medicaments and biological substances status: Secondary | ICD-10-CM | POA: Diagnosis not present

## 2018-08-23 DIAGNOSIS — Z882 Allergy status to sulfonamides status: Secondary | ICD-10-CM | POA: Insufficient documentation

## 2018-08-23 DIAGNOSIS — R55 Syncope and collapse: Secondary | ICD-10-CM | POA: Diagnosis not present

## 2018-08-23 DIAGNOSIS — Z79899 Other long term (current) drug therapy: Secondary | ICD-10-CM | POA: Diagnosis not present

## 2018-08-23 DIAGNOSIS — F0390 Unspecified dementia without behavioral disturbance: Secondary | ICD-10-CM | POA: Diagnosis not present

## 2018-08-23 DIAGNOSIS — M4316 Spondylolisthesis, lumbar region: Secondary | ICD-10-CM | POA: Diagnosis not present

## 2018-08-23 DIAGNOSIS — R531 Weakness: Secondary | ICD-10-CM

## 2018-08-23 DIAGNOSIS — E7800 Pure hypercholesterolemia, unspecified: Secondary | ICD-10-CM | POA: Diagnosis not present

## 2018-08-23 DIAGNOSIS — I700 Atherosclerosis of aorta: Secondary | ICD-10-CM | POA: Insufficient documentation

## 2018-08-23 DIAGNOSIS — Z981 Arthrodesis status: Secondary | ICD-10-CM | POA: Insufficient documentation

## 2018-08-23 DIAGNOSIS — I119 Hypertensive heart disease without heart failure: Secondary | ICD-10-CM | POA: Diagnosis not present

## 2018-08-23 DIAGNOSIS — Z881 Allergy status to other antibiotic agents status: Secondary | ICD-10-CM | POA: Diagnosis not present

## 2018-08-23 DIAGNOSIS — N3000 Acute cystitis without hematuria: Secondary | ICD-10-CM | POA: Diagnosis not present

## 2018-08-23 DIAGNOSIS — Z885 Allergy status to narcotic agent status: Secondary | ICD-10-CM | POA: Diagnosis not present

## 2018-08-23 DIAGNOSIS — F411 Generalized anxiety disorder: Secondary | ICD-10-CM

## 2018-08-23 DIAGNOSIS — E871 Hypo-osmolality and hyponatremia: Secondary | ICD-10-CM | POA: Diagnosis not present

## 2018-08-23 DIAGNOSIS — I10 Essential (primary) hypertension: Secondary | ICD-10-CM | POA: Diagnosis not present

## 2018-08-23 DIAGNOSIS — T83091A Other mechanical complication of indwelling urethral catheter, initial encounter: Secondary | ICD-10-CM | POA: Diagnosis not present

## 2018-08-23 DIAGNOSIS — Z87440 Personal history of urinary (tract) infections: Secondary | ICD-10-CM | POA: Insufficient documentation

## 2018-08-23 DIAGNOSIS — K219 Gastro-esophageal reflux disease without esophagitis: Secondary | ICD-10-CM | POA: Diagnosis not present

## 2018-08-23 DIAGNOSIS — G43909 Migraine, unspecified, not intractable, without status migrainosus: Secondary | ICD-10-CM | POA: Diagnosis not present

## 2018-08-23 DIAGNOSIS — F419 Anxiety disorder, unspecified: Secondary | ICD-10-CM | POA: Diagnosis not present

## 2018-08-23 LAB — BASIC METABOLIC PANEL
Anion gap: 7 (ref 5–15)
BUN: 17 mg/dL (ref 8–23)
CHLORIDE: 96 mmol/L — AB (ref 98–111)
CO2: 28 mmol/L (ref 22–32)
CREATININE: 0.62 mg/dL (ref 0.44–1.00)
Calcium: 10.1 mg/dL (ref 8.9–10.3)
GFR calc non Af Amer: >60 mL/min (ref >60)
Glucose, Bld: 110 mg/dL — ABNORMAL HIGH (ref 70–99)
POTASSIUM: 4.5 mmol/L (ref 3.5–5.1)
Sodium: 131 mmol/L — ABNORMAL LOW (ref 135–145)

## 2018-08-23 LAB — CBC
HEMATOCRIT: 37.8 % (ref 36.0–46.0)
HEMOGLOBIN: 12.4 g/dL (ref 12.0–15.0)
MCH: 28 pg (ref 26.0–34.0)
MCHC: 32.8 g/dL (ref 30.0–36.0)
MCV: 85.3 fL (ref 80.0–100.0)
NRBC: 0 % (ref 0.0–0.2)
Platelets: 199 10*3/uL (ref 150–400)
RBC: 4.43 MIL/uL (ref 3.87–5.11)
RDW: 13 % (ref 11.5–15.5)
WBC: 6.9 10*3/uL (ref 4.0–10.5)

## 2018-08-23 LAB — BLADDER SCAN AMB NON-IMAGING: SCAN RESULT: 0

## 2018-08-23 MED ORDER — MIRTAZAPINE 15 MG PO TABS
30.0000 mg | ORAL_TABLET | Freq: Every day | ORAL | Status: DC
  Administered 2018-08-23: 20:00:00 30 mg via ORAL
  Filled 2018-08-23: qty 2

## 2018-08-23 MED ORDER — SODIUM CHLORIDE 0.9 % IV BOLUS
500.0000 mL | Freq: Once | INTRAVENOUS | Status: AC
  Administered 2018-08-23: 500 mL via INTRAVENOUS

## 2018-08-23 MED ORDER — INFLUENZA VAC SPLIT HIGH-DOSE 0.5 ML IM SUSY
0.5000 mL | PREFILLED_SYRINGE | INTRAMUSCULAR | Status: DC
  Filled 2018-08-23: qty 0.5

## 2018-08-23 MED ORDER — ACETAMINOPHEN 325 MG PO TABS: 650.0000 mg | ORAL_TABLET | Freq: Four times a day (QID) | ORAL | Status: DC | PRN

## 2018-08-23 MED ORDER — ENOXAPARIN SODIUM 40 MG/0.4ML ~~LOC~~ SOLN
40.0000 mg | SUBCUTANEOUS | Status: DC
  Administered 2018-08-23: 40 mg via SUBCUTANEOUS
  Filled 2018-08-23: qty 0.4

## 2018-08-23 MED ORDER — AMLODIPINE BESYLATE 5 MG PO TABS
5.0000 mg | ORAL_TABLET | Freq: Every day | ORAL | Status: DC
  Filled 2018-08-23: qty 1

## 2018-08-23 MED ORDER — LORAZEPAM 2 MG/ML IJ SOLN
0.5000 mg | Freq: Once | INTRAMUSCULAR | Status: AC
  Administered 2018-08-23: 0.5 mg via INTRAVENOUS
  Filled 2018-08-23: qty 1

## 2018-08-23 MED ORDER — HYDROCODONE-ACETAMINOPHEN 10-325 MG PO TABS
1.0000 | ORAL_TABLET | ORAL | Status: DC | PRN
  Administered 2018-08-23 – 2018-08-24 (×4): 1 via ORAL
  Filled 2018-08-23 (×5): qty 1

## 2018-08-23 MED ORDER — ONDANSETRON HCL 4 MG PO TABS: 4.0000 mg | ORAL_TABLET | Freq: Four times a day (QID) | ORAL | Status: DC | PRN

## 2018-08-23 MED ORDER — ONDANSETRON HCL 4 MG/2ML IJ SOLN: 4.0000 mg | Freq: Four times a day (QID) | INTRAMUSCULAR | Status: DC | PRN

## 2018-08-23 MED ORDER — ACETAMINOPHEN 650 MG RE SUPP: 650.0000 mg | Freq: Four times a day (QID) | RECTAL | Status: DC | PRN

## 2018-08-23 MED ORDER — CARVEDILOL 3.125 MG PO TABS
6.2500 mg | ORAL_TABLET | Freq: Two times a day (BID) | ORAL | Status: DC
  Filled 2018-08-23: qty 2

## 2018-08-23 MED ORDER — PNEUMOCOCCAL VAC POLYVALENT 25 MCG/0.5ML IJ INJ: 0.5000 mL | INJECTION | INTRAMUSCULAR | Status: DC

## 2018-08-23 MED ORDER — TAMSULOSIN HCL 0.4 MG PO CAPS
0.4000 mg | ORAL_CAPSULE | Freq: Every day | ORAL | Status: DC
  Administered 2018-08-24: 0.4 mg via ORAL
  Filled 2018-08-23: qty 1

## 2018-08-23 MED ORDER — LORAZEPAM 0.5 MG PO TABS
0.5000 mg | ORAL_TABLET | Freq: Three times a day (TID) | ORAL | Status: DC | PRN
  Administered 2018-08-23 – 2018-08-24 (×2): 0.5 mg via ORAL
  Filled 2018-08-23 (×3): qty 1

## 2018-08-23 MED ORDER — MUPIROCIN 2 % EX OINT
1.0000 "application " | TOPICAL_OINTMENT | Freq: Two times a day (BID) | CUTANEOUS | Status: DC
  Filled 2018-08-23: qty 22

## 2018-08-23 NOTE — ED Notes (Signed)
FIRST NURSE NOTE:  Pt seen yesterday, pt c/o abdominal pain, pt seen by urology today checked pt's foley which they say is draining fine.  Pt continues to complain of abdominal pain and weakness.  Daughter states the patient passed out last night as well and had EMS check the patient out, but declined transport to the ED.

## 2018-08-23 NOTE — ED Notes (Signed)
Pt c/o generalized weakness

## 2018-08-23 NOTE — ED Notes (Signed)
Patient transported to CT 

## 2018-08-23 NOTE — Progress Notes (Signed)
Patient presents today with complaints of abdominal pain Catheter not draining. I adjusted the catheter and got about 300ML out of the bag. I explained to patient's daughter that she may have been sitting on the Catheter and causing it to kink so it wouldn't drain properly. I performed a PVR with a residual of 0ML. Patient abdomen was very tender to the touch and screamed out several times in pain. I spoke to Dr. Bernardo Heater about the patient. Per his request I expressed concern for possible dehydration and she needs to return to the ER for evaluation. [name] voiced understanding and will take her to ER.

## 2018-08-23 NOTE — Progress Notes (Signed)
   [place] [facility] Day: 0 days [name] is a 84 y.o. female presenting with Weakness .   Advance care planning discussed with patient and her daughter [name] at bedside.  Patient is very anxious and extremely high.  Ms. [name] is patient's healthcare power of attorney and is at bedside.  Ms. [name] indicated that she has worked in [employer] office is in the past and is decided not to make a living will for mom.   All questions in regards to overall condition and expected prognosis answered.  Family has decided that patient would want aggressive measures in case of cardiac arrest.  The decision was made to continue current code status However family agreeable for a palliative care consult  CODE STATUS: Full code  time spent: 18 minutes

## 2018-08-23 NOTE — ED Triage Notes (Signed)
Pt arrived with complaints of weakness that has progressively worsened since her d/c from the ER yesterday. Pt states she has had minimal drainage from her folley. Pt's family consulted urologist who suggested pt come to ED for another evaluation. PT also complaints of RLQ pain that started today. Pt has folley intact with urine in bag.

## 2018-08-23 NOTE — ED Provider Notes (Signed)
[facility] Emergency Department Provider Note  ____________________________________________  Time seen: Approximately 4:22 PM  I have reviewed the triage vital signs and the nursing notes.   HISTORY  Chief Complaint Weakness  Level 5 caveat:  Portions of the history and physical were unable to be obtained due to dementia   HPI [name] is a 84 y.o. female with history as listed below presents for evaluation of generalized weakness and syncope. The patient was seen in the emergency department yesterday complaining of weakness, constipation and abdominal pain. She had labs and a CT of her abdomen which did not show any abnormalities. She was discharged back home. After getting home patient had dinner with her family and while sitting on the dinner table had a syncopal event. After she was placed on the ground patient regained consciousness. 911 was called but patient refused transfer last night and says she just wanted to go to bed. Today she had an appointment with her urologist and when the family mentioned the syncopal event he recommended that she came back to the emergency room to be evaluated. According to the daughter patient has become progressively more weak since yesterday and this afternoon has been unable to get up or ambulate without assistance. Patient complains of feeling very weak. She is also complaining of a headache. She denies abdominal pain or chest pain. No fever at home.   Past Medical History:  Diagnosis Date  . Allergy    environmental  . Anxiety   . Chronic back pain   . High cholesterol   . History of urinary retention   . Hypertension   . Migraines   . Osteoporosis   . Pneumonia   . Sinusitis     Patient Active Problem List   Diagnosis Date Noted  . Pressure injury of skin 07/29/2018  . Cervical lymphadenopathy 07/18/2018  . Pain in right hip 07/18/2018  . Acute recurrent sinusitis 06/25/2018  . Lymphadenopathy  06/25/2018  . Stomatitis and mucositis 06/25/2018  . Chest pain 02/16/2018  . Shortness of breath 02/16/2018  . Other fatigue 02/16/2018  . Arm fracture, left 01/03/2018  . Arthritis 01/03/2018  . Compression fracture of thoracic vertebra ([place]) 01/03/2018  . Emphysema lung ([place]) 01/03/2018  . GERD (gastroesophageal reflux disease) 01/03/2018  . GI bleeding 01/03/2018  . Lumbar spondylosis 01/03/2018  . Menstrual problem 01/03/2018  . Osteoarthritis of knee 01/03/2018  . Osteoporosis 01/03/2018  . Acute pain of right knee 01/03/2018  . Ankle sprain 01/03/2018  . Synovial cyst of popliteal space 01/03/2018  . History of urinary retention 11/10/2017  . Generalized anxiety disorder 10/24/2017  . Constipation 10/24/2017  . Internal hemorrhoids 10/24/2017  . Essential (primary) hypertension 10/24/2017  . Thrombocytopenia ([place]) 10/24/2017  . Acute encephalopathy 08/04/2017  . Hyponatremia 12/15/2015    Past Surgical History:  Procedure Laterality Date  . ABDOMINAL HYSTERECTOMY    . APPENDECTOMY    . BACK SURGERY    . BREAST BIOPSY Right 2002   neg  . HEMORRHOID SURGERY    . TONSILLECTOMY      Prior to Admission medications   Medication Sig Start Date End Date Taking? Authorizing Provider  amLODipine (NORVASC) 5 MG tablet Take 1 tablet (5 mg total) by mouth daily. 04/28/18  Yes Lavera Guise, MD  carvedilol (COREG) 6.25 MG tablet Take 1 tablet (6.25 mg total) by mouth 2 (two) times daily with a meal. 08/03/18  Yes Lavera Guise, MD  furosemide (LASIX)  20 MG tablet Take 1 tablet po QD prn Patient taking differently: Take 20 mg by mouth daily as needed. Take 1 tablet po QD prn 04/28/18  Yes Lavera Guise, MD  HYDROcodone-acetaminophen [facility]) 10-325 MG tablet Take 1 tablet by mouth every 4 (four) hours as needed for moderate pain.    Yes [provider]  LORazepam (ATIVAN) 0.5 MG tablet Take 1 tablet (0.5 mg total) by mouth 3 (three) times daily as needed for anxiety.  08/12/18  Yes Delman Kitten, MD  mirtazapine (REMERON) 30 MG tablet Take 1 tablet (30 mg total) by mouth at bedtime. 06/16/18  Yes Boscia, Greer Ee, NP  mupirocin ointment (BACTROBAN) 2 % Place 1 application into the nose 2 (two) times daily. 03/15/18  Yes Boscia, Greer Ee, NP  tamsulosin (FLOMAX) 0.4 MG CAPS capsule Take 1 capsule (0.4 mg total) by mouth daily. 04/21/18  Yes McGowan, Larene Beach A, PA-C  oxybutynin (DITROPAN) 5 MG tablet Take 1 tablet (5 mg total) by mouth 2 (two) times daily as needed for bladder spasms. Patient not taking: Reported on 08/22/2018 08/01/18   Gladstone Lighter, MD    Allergies Ceftin [cefuroxime]; Cortisone; Fluticasone; Levofloxacin; Meperidine; Morphine and related; Morphine sulfate; Nisoldipine; Penicillin g potassium in d5w; Prednisone; and Sulfa antibiotics  Family History  Problem Relation Age of Onset  . Breast cancer Sister 70  . Breast cancer Maternal Grandmother 70  . Breast cancer Sister 6  . Bladder Cancer Brother   . Hypertension Neg Hx   . Kidney cancer Neg Hx     Social History Social History   Tobacco Use  . Smoking status: Never Smoker  . Smokeless tobacco: Never Used  Substance Use Topics  . Alcohol use: No  . Drug use: No    Review of Systems  Constitutional: Negative for fever. + weakness and syncope Cardiovascular: Negative for chest pain. Respiratory: Negative for shortness of breath. Gastrointestinal: Negative for abdominal pain, vomiting or diarrhea. Genitourinary: Negative for dysuria. Musculoskeletal: Negative for back pain. Skin: Negative for rash. Neurological: Negative for weakness or numbness. + HA Psych: No SI or HI  ____________________________________________   PHYSICAL EXAM:  VITAL SIGNS: ED Triage Vitals  Enc Vitals Group     BP 08/23/18 1453 137/84     Pulse Rate 08/23/18 1453 81     Resp 08/23/18 1453 16     Temp 08/23/18 1453 98.1 F (36.7 C)     Temp Source 08/23/18 1453 Oral     SpO2 08/23/18  1453 95 %     Weight 08/23/18 1457 138 lb 14.2 oz (63 kg)     Height 08/23/18 1457 5' (1.524 m)     Head Circumference --      Peak Flow --      Pain Score 08/23/18 1457 6     Pain Loc --      Pain Edu? --      Excl. in [place]? --     Constitutional: Awake and alert x 2, no apparent distress. HEENT:      Head: Normocephalic and atraumatic.         Eyes: Conjunctivae are normal. Sclera is non-icteric.       Mouth/Throat: Mucous membranes are dry.       Neck: Supple with no signs of meningismus. Cardiovascular: Regular rate and rhythm. No murmurs, gallops, or rubs. 2+ symmetrical distal pulses are present in all extremities. No JVD. Respiratory: Normal respiratory effort. Lungs are clear to auscultation bilaterally. No wheezes,  crackles, or rhonchi.  Gastrointestinal: Soft, non tender, and non distended with positive bowel sounds. No rebound or guarding. Musculoskeletal: Nontender with normal range of motion in all extremities. No edema, cyanosis, or erythema of extremities. Neurologic: Normal speech and language. Face is symmetric. Moving all extremities. No gross focal neurologic deficits are appreciated. Skin: Skin is warm, dry and intact. No rash noted. Psychiatric: Mood and affect are normal. Speech and behavior are normal.  ____________________________________________   LABS (all labs ordered are listed, but only abnormal results are displayed)  Labs Reviewed  BASIC METABOLIC PANEL - Abnormal; Notable for the following components:      Result Value   Sodium 131 (*)    Chloride 96 (*)    Glucose, Bld 110 (*)    All other components within normal limits  CBC  URINALYSIS, COMPLETE (UACMP) WITH MICROSCOPIC   ____________________________________________  EKG  ED ECG REPORT I, Rudene Re, the attending physician, personally viewed and interpreted this ECG.  Normal sinus rhythm with first-degree AV block, rate of 75, left axis deviation, Q waves in inferior lateral  leads but no ST elevations or depressions. Unchanged from prior ____________________________________________  RADIOLOGY  I have personally reviewed the images performed during this visit and I agree with the Radiologist's read.   Interpretation by Radiologist:  Ct Head Wo Contrast  Result Date: 08/23/2018 CLINICAL DATA:  84 year old female with progressed weakness and headache since yesterday. EXAM: CT HEAD WITHOUT CONTRAST TECHNIQUE: Contiguous axial images were obtained from the base of the skull through the vertex without intravenous contrast. COMPARISON:  03/22/2018 and earlier. FINDINGS: Brain: Stable cerebral volume. No midline shift, ventriculomegaly, mass effect, evidence of mass lesion, intracranial hemorrhage or evidence of cortically based acute infarction. Patchy and confluent bilateral cerebral white matter hypodensity is stable. No cortical encephalomalacia identified. Vascular: Calcified atherosclerosis at the skull base. Mild generalized intracranial artery ectasia. No suspicious intracranial vascular hyperdensity. Skull: Stable. Osteopenia. Sinuses/Orbits: Visualized paranasal sinuses and mastoids are stable and well pneumatized. Other: Stable, negative. IMPRESSION: No acute intracranial abnormality. Stable non contrast CT appearance of the brain since June. Electronically Signed   By: Genevie Ann M.D.   On: 08/23/2018 16:37      ____________________________________________   PROCEDURES  Procedure(s) performed: None Procedures Critical Care performed:  None ____________________________________________   INITIAL IMPRESSION / ASSESSMENT AND PLAN / ED COURSE  84 y.o. female with history as listed below presents for evaluation of generalized weakness and syncope.the patient is grossly neurologically intact, looks dry on exam, otherwise exam is nonfocal. EKG showed no evidence of ischemia dysrhythmias. Labs showing a normal creatinine, normal electrolytes, and normal glucose, no  anemia, no leukocytosis. UA was done yesterday which was negative for UTI. Patient also underwent CT abdomen and pelvis yesterday for abdominal pain and that is negative for any intra-abdominal findings. Today patient is complaining of a headache. We'll send her for a head CT. Anticipate admission for generalized weakness and syncope. Will give IVF and monitor on telemetry for dysrhythmia    _________________________ 4:54 PM on 08/23/2018 -----------------------------------------  head CT negative for any acute findings. We'll discuss with the hospitalist for admission for further evaluation of syncope.  As part of my medical decision making, I reviewed the following data within the [facility] History obtained from family, Nursing notes reviewed and incorporated, Labs reviewed , EKG interpreted , Old EKG reviewed, Old chart reviewed, Radiograph reviewed , Discussed with admitting physician , Notes from prior ED visits and [place] Controlled  Substance Database    Pertinent labs & imaging results that were available during my care of the patient were reviewed by me and considered in my medical decision making (see chart for details).    ____________________________________________   FINAL CLINICAL IMPRESSION(S) / ED DIAGNOSES  Final diagnoses:  Syncope, unspecified syncope type  Generalized weakness      NEW MEDICATIONS STARTED DURING THIS VISIT:  ED Discharge Orders    None       Note:  This document was prepared using Dragon voice recognition software and may include unintentional dictation errors.    Rudene Re, MD 08/23/18 [phone]

## 2018-08-23 NOTE — H&P (Signed)
[name] [place] NAME: [name]    MR#:  [id number]  DATE OF BIRTH:  04/25/1925  DATE OF ADMISSION:  08/23/2018  PRIMARY CARE PHYSICIAN: Lavera Guise, MD   REQUESTING/REFERRING PHYSICIAN: Dr. Rudene Re  CHIEF COMPLAINT:   Chief Complaint  Patient presents with  . Weakness    HISTORY OF PRESENT ILLNESS:  [name]  is a 84 y.o. female with a known history of severe anxiety, osteoporosis, hypertension, migraines, urinary retention with Foley catheter now, history of recurrent UTIs and chronic hyponatremia presents to hospital secondary to syncope and weakness. Patient lives at home with daughter [name].  Ms. [name] is at bedside.  She was just discharged from the hospital about a month ago.  She is extremely anxious at baseline.  She takes Ativan at home.  Patient was in the ED about 2 weeks ago for urinary retention and was discharged with the Foley catheter and was started on Flomax and advised to follow-up with urology.  She was in the ED yesterday complaining of abdominal pain, urine analysis did not reveal any infection.  Blood work was within normal limits, vitals were stable.  CT of the abdomen was within normal limits.  She was discharged home.  Morning she was eating breakfast and had a syncopal episode after a few minutes until the EMS arrived.  She ambulates with a walker at baseline but has started to become extremely weak later in the day and could not even move.  Denies any fevers or chills.  No nausea or vomiting.  Has chronic low back pain.  Abdominal pain intermittently.  Lab work here again is within normal limits.  PAST MEDICAL HISTORY:   Past Medical History:  Diagnosis Date  . Allergy    environmental  . Anxiety   . Chronic back pain   . High cholesterol   . History of urinary retention   . Hypertension   . Migraines   . Osteoporosis   . Pneumonia   . Sinusitis     PAST SURGICAL HISTORY:    Past Surgical History:  Procedure Laterality Date  . ABDOMINAL HYSTERECTOMY    . APPENDECTOMY    . BACK SURGERY    . BREAST BIOPSY Right 2002   neg  . HEMORRHOID SURGERY    . TONSILLECTOMY      SOCIAL HISTORY:   Social History   Tobacco Use  . Smoking status: Never Smoker  . Smokeless tobacco: Never Used  Substance Use Topics  . Alcohol use: No    FAMILY HISTORY:   Family History  Problem Relation Age of Onset  . Breast cancer Sister 42  . Breast cancer Maternal Grandmother 95  . Breast cancer Sister 45  . Bladder Cancer Brother   . Hypertension Neg Hx   . Kidney cancer Neg Hx     DRUG ALLERGIES:   Allergies  Allergen Reactions  . Ceftin [Cefuroxime]   . Cortisone Other (See Comments)    Headache   . Fluticasone Swelling    Facial  . Levofloxacin Nausea And Vomiting  . Meperidine Other (See Comments)  . Morphine And Related Other (See Comments)    headache  . Morphine Sulfate   . Nisoldipine   . Penicillin G Potassium In D5w   . Prednisone Other (See Comments)    Headache   . Sulfa Antibiotics Nausea Only    REVIEW OF SYSTEMS:   Review of Systems  Constitutional:  Positive for malaise/fatigue. Negative for chills, fever and weight loss.  HENT: Positive for hearing loss. Negative for ear discharge, ear pain, nosebleeds and tinnitus.   Eyes: Negative for blurred vision, double vision and photophobia.  Respiratory: Negative for cough, hemoptysis, shortness of breath and wheezing.   Cardiovascular: Negative for chest pain, palpitations, orthopnea and leg swelling.  Gastrointestinal: Positive for abdominal pain. Negative for constipation, diarrhea, heartburn, melena, nausea and vomiting.  Genitourinary: Negative for dysuria, frequency and urgency.  Musculoskeletal: Positive for back pain. Negative for myalgias and neck pain.  Skin: Negative for rash.  Neurological: Positive for dizziness and weakness. Negative for tingling, tremors, sensory change,  speech change, focal weakness and headaches.  Endo/Heme/Allergies: Does not bruise/bleed easily.  Psychiatric/Behavioral: Negative for depression.    MEDICATIONS AT HOME:   Prior to Admission medications   Medication Sig Start Date End Date Taking? Authorizing Provider  amLODipine (NORVASC) 5 MG tablet Take 1 tablet (5 mg total) by mouth daily. 04/28/18  Yes Lavera Guise, MD  carvedilol (COREG) 6.25 MG tablet Take 1 tablet (6.25 mg total) by mouth 2 (two) times daily with a meal. 08/03/18  Yes Lavera Guise, MD  furosemide (LASIX) 20 MG tablet Take 1 tablet po QD prn Patient taking differently: Take 20 mg by mouth daily as needed. Take 1 tablet po QD prn 04/28/18  Yes Lavera Guise, MD  HYDROcodone-acetaminophen [facility]) 10-325 MG tablet Take 1 tablet by mouth every 4 (four) hours as needed for moderate pain.    Yes [provider]  LORazepam (ATIVAN) 0.5 MG tablet Take 1 tablet (0.5 mg total) by mouth 3 (three) times daily as needed for anxiety. 08/12/18  Yes Delman Kitten, MD  mirtazapine (REMERON) 30 MG tablet Take 1 tablet (30 mg total) by mouth at bedtime. 06/16/18  Yes Boscia, Greer Ee, NP  mupirocin ointment (BACTROBAN) 2 % Place 1 application into the nose 2 (two) times daily. 03/15/18  Yes Boscia, Greer Ee, NP  tamsulosin (FLOMAX) 0.4 MG CAPS capsule Take 1 capsule (0.4 mg total) by mouth daily. 04/21/18  Yes McGowan, Larene Beach A, PA-C  oxybutynin (DITROPAN) 5 MG tablet Take 1 tablet (5 mg total) by mouth 2 (two) times daily as needed for bladder spasms. Patient not taking: Reported on 08/22/2018 08/01/18   Gladstone Lighter, MD      VITAL SIGNS:  Blood pressure 137/84, pulse 81, temperature 98.1 F (36.7 C), temperature source Oral, resp. rate 16, height 5' (1.524 m), weight 63 kg, SpO2 95 %.  PHYSICAL EXAMINATION:   Physical Exam  GENERAL:  84 y.o.-year-old patient lying in the bed, extremely anxious Very hard of hearing EYES: Pupils equal, round, reactive to light and  accommodation. No scleral icterus. Extraocular muscles intact.  HEENT: Head atraumatic, normocephalic. Oropharynx and nasopharynx clear.  NECK:  Supple, no jugular venous distention. No thyroid enlargement, no tenderness.  LUNGS: Normal breath sounds bilaterally, no wheezing, rales,rhonchi or crepitation. No use of accessory muscles of respiration. Decreased bibasilar breath sounds CARDIOVASCULAR: S1, S2 normal. No  rubs, or gallops. 3/6 systolic murmur present ABDOMEN: Soft, nontender, nondistended. Bowel sounds present. No organomegaly or mass.  EXTREMITIES: No  cyanosis, or clubbing. 1+ pedal edema noted NEUROLOGIC: Cranial nerves II through XII are intact. Muscle strength 5/5 in all extremities. Sensation intact. Gait not checked. Global weakness noted. PSYCHIATRIC: The patient is alert and oriented x 1  SKIN: No obvious rash, lesion, or ulcer.   LABORATORY PANEL:   CBC Recent Labs  Lab  08/23/18 1502  WBC 6.9  HGB 12.4  HCT 37.8  PLT 199   ------------------------------------------------------------------------------------------------------------------  Chemistries  Recent Labs  Lab 08/22/18 1452 08/23/18 1502  NA 133* 131*  K 3.8 4.5  CL 99 96*  CO2 26 28  GLUCOSE 104* 110*  BUN 10 17  CREATININE 0.51 0.62  CALCIUM 10.1 10.1  AST 25  --   ALT 15  --   ALKPHOS 73  --   BILITOT 1.1  --    ------------------------------------------------------------------------------------------------------------------  Cardiac Enzymes No results for input(s): TROPONINI in the last 168 hours. ------------------------------------------------------------------------------------------------------------------  RADIOLOGY:  Ct Head Wo Contrast  Result Date: 08/23/2018 CLINICAL DATA:  84 year old female with progressed weakness and headache since yesterday. EXAM: CT HEAD WITHOUT CONTRAST TECHNIQUE: Contiguous axial images were obtained from the base of the skull through the vertex  without intravenous contrast. COMPARISON:  03/22/2018 and earlier. FINDINGS: Brain: Stable cerebral volume. No midline shift, ventriculomegaly, mass effect, evidence of mass lesion, intracranial hemorrhage or evidence of cortically based acute infarction. Patchy and confluent bilateral cerebral white matter hypodensity is stable. No cortical encephalomalacia identified. Vascular: Calcified atherosclerosis at the skull base. Mild generalized intracranial artery ectasia. No suspicious intracranial vascular hyperdensity. Skull: Stable. Osteopenia. Sinuses/Orbits: Visualized paranasal sinuses and mastoids are stable and well pneumatized. Other: Stable, negative. IMPRESSION: No acute intracranial abnormality. Stable non contrast CT appearance of the brain since June. Electronically Signed   By: Genevie Ann M.D.   On: 08/23/2018 16:37   Ct Abdomen Pelvis W Contrast  Result Date: 08/22/2018 CLINICAL DATA:  Lower quadrant abdominal pain, no bowel movement for 4 days, LEFT lower quadrant pain with palpation, infilling Foley catheter being followed by urology, history hypertension, emphysema EXAM: CT ABDOMEN AND PELVIS WITH CONTRAST TECHNIQUE: Multidetector CT imaging of the abdomen and pelvis was performed using the standard protocol following bolus administration of intravenous contrast. Sagittal and coronal MPR images reconstructed from axial data set. CONTRAST:  126mL ISOVUE-300 IOPAMIDOL (ISOVUE-300) INJECTION 61% IV. No oral contrast. COMPARISON:  08/12/2018 FINDINGS: Lower chest: Minimal bibasilar atelectasis Hepatobiliary: Minimal intrahepatic biliary dilatation centrally, potentially related age. No extrahepatic biliary dilatation. Gallbladder and liver otherwise normal appearance. Pancreas: Normal appearance Spleen: Normal appearance Adrenals/Urinary Tract: Adrenal glands normal appearance. Small upper pole LEFT renal cyst. Kidneys, ureters, and bladder normal appearance. Catheter within bladder. Stomach/Bowel:  Appendix surgically absent by history. Stomach decompressed. Bowel loops unremarkable. Vascular/Lymphatic: Atherosclerotic calcifications aorta and iliac arteries. Aorta mildly tortuous but normal in caliber. Mild enlargement of cardiac chambers. Numerous pelvic phleboliths. Reproductive: Uterus surgically absent. Nonvisualization of LEFT ovary. Questionable visualization of a normal sized RIGHT ovary. Other: No free air or free fluid. Questionable small RIGHT inguinal hernia containing fat. Musculoskeletal: Osseous demineralization. Prior lumbar fusion L3-S1 with associated beam hardening artifacts. Anterolisthesis L3-L4. Superior endplate compression deformity of L5. Lumbar spine finding stable. Pelvis intact. IMPRESSION: No acute intra-abdominal or intrapelvic abnormalities. Questionable small RIGHT inguinal hernia containing fat. Aortic Atherosclerosis (ICD10-I70.0). Electronically Signed   By: Lavonia Dana M.D.   On: 08/22/2018 15:50    EKG:   Orders placed or performed during the hospital encounter of 08/23/18  . EKG 12-Lead  . EKG 12-Lead  . ED EKG  . ED EKG    IMPRESSION AND PLAN:   Ramina Hulet  is a 84 y.o. female with a known history of severe anxiety, osteoporosis, hypertension, migraines, urinary retention with Foley catheter now, history of recurrent UTIs and chronic hyponatremia presents to hospital secondary to syncope and weakness.  1.  Syncope-could have been a vasovagal syncope.  Admit, check orthostatics. -Echocardiogram and carotid Dopplers ordered -Physical therapy consult.  No further syncopal episodes at this time.  2.  Urinary retention-prior history of urinary retention requiring Foley in the past.  Most recent retention started 2 weeks ago and was started on Flomax and a Foley was placed in the ED. -Continue Foley catheter for now. -Prior history of UTIs, however urine looks unremarkable this time.  Recently finished antibiotics.  3.  Severe anxiety-discharged on  few pills of Ativan as her PCP discontinued Ativan.  Patient cannot function without the Ativan -Patient is extremely paranoid and anxious that she is going to die. -On Remeron at bedtime -Palliative care consult requested for goals of care conversation  4.  Hypertension-continue home medications.  Patient on Norvasc and Coreg  5.  Chronic hyponatremia-uses Lasix as needed.  Received fluids in the ED.  Continue to monitor.  At baseline now  6.  DVT prophylaxis-Lovenox  Physical therapy consult requested    All the records are reviewed and case discussed with ED provider. Management plans discussed with the patient, family and they are in agreement.  CODE STATUS: Full Code  TOTAL TIME TAKING CARE OF THIS PATIENT: 50 minutes.    Gladstone Lighter M.D on 08/23/2018 at 5:20 PM  Between 7am to 6pm - Pager - [phone]  After 6pm go to [email or web address] - password [email or web address] [facility]  Office  [phone]  CC: Primary care physician; Lavera Guise, MD

## 2018-08-24 ENCOUNTER — Observation Stay (HOSPITAL_BASED_OUTPATIENT_CLINIC_OR_DEPARTMENT_OTHER)
Admit: 2018-08-24 | Discharge: 2018-08-24 | Disposition: A | Discharge status: Home / Self Care | Payer: Medicare Other | Attending: Internal Medicine | Admitting: Internal Medicine

## 2018-08-24 ENCOUNTER — Other Ambulatory Visit: Payer: Self-pay

## 2018-08-24 ENCOUNTER — Encounter: Payer: Self-pay | Admitting: Emergency Medicine

## 2018-08-24 ENCOUNTER — Emergency Department
Admission: EM | Admit: 2018-08-24 | Discharge: 2018-08-25 | Disposition: A | Discharge status: Home / Self Care | Payer: Medicare Other | Source: Home / Self Care | Attending: Emergency Medicine | Admitting: Emergency Medicine

## 2018-08-24 ENCOUNTER — Observation Stay: Payer: Medicare Other

## 2018-08-24 DIAGNOSIS — Y731 Therapeutic (nonsurgical) and rehabilitative gastroenterology and urology devices associated with adverse incidents: Secondary | ICD-10-CM | POA: Insufficient documentation

## 2018-08-24 DIAGNOSIS — F419 Anxiety disorder, unspecified: Secondary | ICD-10-CM | POA: Diagnosis not present

## 2018-08-24 DIAGNOSIS — N3000 Acute cystitis without hematuria: Secondary | ICD-10-CM | POA: Insufficient documentation

## 2018-08-24 DIAGNOSIS — R339 Retention of urine, unspecified: Secondary | ICD-10-CM | POA: Diagnosis not present

## 2018-08-24 DIAGNOSIS — T839XXA Unspecified complication of genitourinary prosthetic device, implant and graft, initial encounter: Secondary | ICD-10-CM | POA: Insufficient documentation

## 2018-08-24 DIAGNOSIS — Z79899 Other long term (current) drug therapy: Secondary | ICD-10-CM | POA: Insufficient documentation

## 2018-08-24 DIAGNOSIS — T83091A Other mechanical complication of indwelling urethral catheter, initial encounter: Secondary | ICD-10-CM | POA: Diagnosis not present

## 2018-08-24 DIAGNOSIS — R55 Syncope and collapse: Secondary | ICD-10-CM

## 2018-08-24 DIAGNOSIS — I10 Essential (primary) hypertension: Secondary | ICD-10-CM

## 2018-08-24 DIAGNOSIS — I959 Hypotension, unspecified: Secondary | ICD-10-CM | POA: Diagnosis not present

## 2018-08-24 DIAGNOSIS — I6523 Occlusion and stenosis of bilateral carotid arteries: Secondary | ICD-10-CM | POA: Diagnosis not present

## 2018-08-24 LAB — BASIC METABOLIC PANEL
Anion gap: 4 — ABNORMAL LOW (ref 5–15)
BUN: 10 mg/dL (ref 8–23)
CALCIUM: 9.4 mg/dL (ref 8.9–10.3)
CO2: 29 mmol/L (ref 22–32)
CREATININE: 0.45 mg/dL (ref 0.44–1.00)
Chloride: 102 mmol/L (ref 98–111)
GFR calc non Af Amer: >60 mL/min (ref >60)
GLUCOSE: 96 mg/dL (ref 70–99)
Potassium: 4 mmol/L (ref 3.5–5.1)
Sodium: 135 mmol/L (ref 135–145)

## 2018-08-24 LAB — URINALYSIS, COMPLETE (UACMP) WITH MICROSCOPIC
Bilirubin Urine: NEGATIVE
Glucose, UA: NEGATIVE mg/dL
Ketones, ur: NEGATIVE mg/dL
Nitrite: NEGATIVE
PROTEIN: 100 mg/dL — AB
RBC / HPF: >50 RBC/hpf — ABNORMAL HIGH (ref 0–5)
SQUAMOUS EPITHELIAL / LPF: NONE SEEN (ref 0–5)
Specific Gravity, Urine: 1.02 (ref 1.005–1.030)
WBC, UA: >50 WBC/hpf — ABNORMAL HIGH (ref 0–5)
pH: 6 (ref 5.0–8.0)

## 2018-08-24 LAB — ECHOCARDIOGRAM COMPLETE
Height: 60 in
WEIGHTICAEL: 2222.24 [oz_av]

## 2018-08-24 LAB — CBC
HCT: 36.1 % (ref 36.0–46.0)
Hemoglobin: 11.7 g/dL — ABNORMAL LOW (ref 12.0–15.0)
MCH: 27.6 pg (ref 26.0–34.0)
MCHC: 32.4 g/dL (ref 30.0–36.0)
MCV: 85.1 fL (ref 80.0–100.0)
PLATELETS: 182 10*3/uL (ref 150–400)
RBC: 4.24 MIL/uL (ref 3.87–5.11)
RDW: 13 % (ref 11.5–15.5)
WBC: 6.4 10*3/uL (ref 4.0–10.5)
nRBC: 0 % (ref 0.0–0.2)

## 2018-08-24 MED ORDER — CARVEDILOL 3.125 MG PO TABS
3.1250 mg | ORAL_TABLET | Freq: Two times a day (BID) | ORAL | Status: DC
  Administered 2018-08-24: 3.125 mg via ORAL
  Filled 2018-08-24: qty 1

## 2018-08-24 MED ORDER — CARVEDILOL 3.125 MG PO TABS: 6.2500 mg | ORAL_TABLET | Freq: Two times a day (BID) | ORAL | 0 refills | Status: DC

## 2018-08-24 MED ORDER — LIDOCAINE HCL URETHRAL/MUCOSAL 2 % EX GEL
CUTANEOUS | Status: AC
  Administered 2018-08-25: 1 via URETHRAL
  Filled 2018-08-24: qty 10

## 2018-08-24 MED ORDER — LORAZEPAM 0.5 MG PO TABS: 0.5000 mg | ORAL_TABLET | Freq: Three times a day (TID) | ORAL | 0 refills | Status: DC | PRN

## 2018-08-24 MED ORDER — KETAMINE HCL 50 MG/ML IJ SOLN
INTRAMUSCULAR | Status: AC
  Filled 2018-08-24: qty 10

## 2018-08-24 NOTE — ED Notes (Addendum)
Patient was just discharged from this hospital today with urinary catheter in place. [name] reports large amount of urine leaking around catheter. Patient very tender to palpation of abdomen. No urine seen on bladder scan. Urine present in leg bag on arrival. [name] reports bag has not been drained since discharge from hospital.

## 2018-08-24 NOTE — Care Management Obs Status (Signed)
[facility] NOTIFICATION   Patient Details  Name: [name] MRN: [id number] Date of Birth: [birth year]   Medicare Observation Status Notification Given:  Yes: Explained to Ms. Casper, RN 08/24/2018, 10:53 AM

## 2018-08-24 NOTE — Progress Notes (Signed)
Pt is A&Ox4 with confusion and difficulty hearing. Pt complains of pain and increased anxiety. MD notified. Will continue to monitor

## 2018-08-24 NOTE — Progress Notes (Signed)
PT Cancellation Note  Patient Details Name: [name] MRN: [id number] DOB: 08-02-1925   Cancelled Treatment:    Reason Eval/Treat Not Completed: Patient at procedure or test/unavailable(Attempted to see patient twice, either out of room or with another provider. Will follow acutely and attempt evaluation again at later date/time. )  11:29 AM, 08/24/18 Etta Grandchild, PT, DPT Physical Therapist - [facility]  [phone] ([place])     Buccola,Allan C 08/24/2018, 11:29 AM

## 2018-08-24 NOTE — Progress Notes (Signed)
Palliative care:  Consult received and chart reviewed. Patient evaluated this morning and stated that daughters worked and would not be available to discuss [place]. Patient is now being discharged. Recommend outpatient palliative care consult.  Juel Burrow, DNP, AGNP-C Palliative Medicine Team Team Phone # [phone]  Pager # [phone]  No charge

## 2018-08-24 NOTE — ED Notes (Signed)
ED Provider at bedside. 

## 2018-08-24 NOTE — Care Management Note (Signed)
Case Management Note  Patient Details  Name: [name] MRN: [id number] Date of Birth: 08/21/1925  Subjective/Objective:  Admitted to [facility] under observation status with the diagnosis of syncope. [name] lives in the home [phone]). States she has seen Dr, Humphrey Rolls in the past, but doesn't want to see him again. Getting new primary care physician. Prescriptions are filled at [facility] in [place] 10 years ago following surgery. Doesn't remember name of agency. No skilled facility. Cane, rolling walker, raised toilet seat, and shower chair in the home. Self feed, needs help with baths and dressing.  [place] about 1.5 months ago. Decreased appetite due to oral surgery and hasn't got teeth yet. Daughter will transport Foley in place.                   Action/Plan: Physical therapy pending   Expected Discharge Date:                  Expected Discharge Plan:     In-House Referral:   yes  Discharge planning Services   yes  Post Acute Care Choice:    Choice offered to:     DME Arranged:    DME Agency:     HH Arranged:    HH Agency:     Status of Service:     If discussed at [employer] of Stay Meetings, dates discussed:    Additional Comments:  Shelbie Ammons, RN MSN CCM Care Management [phone] 08/24/2018, 11:21 AM

## 2018-08-24 NOTE — Evaluation (Signed)
Physical Therapy Evaluation Patient Details Name: TIEARA FLITTON MRN: [id number] DOB: 1924-10-19 Today's Date: 08/24/2018   History of Present Illness  84 y.o. female with a known history of severe anxiety, osteoporosis, hypertension, migraines, urinary retention with Foley catheter now, history of recurrent UTIs and chronic hyponatremia presents to hospital secondary to syncope and weakness.  Clinical Impression  Pt did well with physical/safety aspects of PT.  She was more limited secondary to hearing difficulty and anxiety.  She was able to easily get to standing at EOB and walk >100 ft with walker.  Her HR was ~100 on arrival and reached 120 during activity, she reported feeling tired but not exhausted.  Overall pt did well and showed good safety and ability to go home.  Will not need PT follow up on discharge and should be safe to go home.     Follow Up Recommendations No PT follow up    Equipment Recommendations  None recommended by PT    Recommendations for Other Services       Precautions / Restrictions Precautions Precautions: Fall Restrictions Weight Bearing Restrictions: No      Mobility  Bed Mobility Overal bed mobility: Modified Independent             General bed mobility comments: Pt able to get to sitting EOB with relative ease  Transfers Overall transfer level: Modified independent Equipment used: Rolling walker (2 wheeled)             General transfer comment: Pt showed good confidence and safety in getting to standing EOB  Ambulation/Gait Ambulation/Gait assistance: Modified independent (Device/Increase time) Gait Distance (Feet): 125 Feet Assistive device: Rolling walker (2 wheeled)       General Gait Details: Pt walked using walker with good confidence and age appropriate speed.  She reports she does better with shoes on (and likely would have been willing to do the entire loop if she had them on)  No LOBs or safety issues, some fatigue  (HR increased from 100s to ~120 with the effort)  Stairs            Wheelchair Mobility    Modified Rankin (Stroke Patients Only)       Balance Overall balance assessment: Modified Independent                                           Pertinent Vitals/Pain Pain Assessment: No/denies pain    Home Living Family/patient expects to be discharged to:: Private residence Living Arrangements: Children Available Help at Discharge: Family;Available PRN/intermittently Type of Home: House Home Access: Stairs to enter   [employer] of Steps: 3 in front, 4-5 in back Home Layout: One level Home Equipment: Clinical cytogeneticist - 2 wheels;Cane - single point      Prior Function Level of Independence: Independent with assistive device(s)         Comments: States she uses walker in the home, cane when she gets out (not often these days)     Hand Dominance        Extremity/Trunk Assessment   Upper Extremity Assessment Upper Extremity Assessment: Overall WFL for tasks assessed;Generalized weakness    Lower Extremity Assessment Lower Extremity Assessment: Overall WFL for tasks assessed;Generalized weakness       Communication   Communication: HOH  Cognition Arousal/Alertness: Awake/alert Behavior During Therapy: Anxious Overall Cognitive Status: Within Functional Limits  for tasks assessed                                 General Comments: Pt initially not wanting to work with PT until she gets "a nerve pill" later relents      General Comments      Exercises     Assessment/Plan    PT Assessment Patent does not need any further PT services  PT Problem List         PT Treatment Interventions      PT Goals (Current goals can be found in the Care Plan section)  Acute Rehab PT Goals Patient Stated Goal: go home and take a nerve pill and be alone PT Goal Formulation: All assessment and education complete, DC  therapy    Frequency     Barriers to discharge        Co-evaluation               AM-PAC PT "6 Clicks" Daily Activity  Outcome Measure Difficulty turning over in bed (including adjusting bedclothes, sheets and blankets)?: None Difficulty moving from lying on back to sitting on the side of the bed? : None Difficulty sitting down on and standing up from a chair with arms (e.g., wheelchair, bedside commode, etc,.)?: None Help needed moving to and from a bed to chair (including a wheelchair)?: None Help needed walking in hospital room?: None Help needed climbing 3-5 steps with a railing? : A Little 6 Click Score: 23    End of Session Equipment Utilized During Treatment: Gait belt Activity Tolerance: Patient limited by fatigue;Patient tolerated treatment well Patient left: with bed alarm set;with call bell/phone within reach Nurse Communication: Mobility status PT Visit Diagnosis: Muscle weakness (generalized) (M62.81);Difficulty in walking, not elsewhere classified (R26.2)    Time: 7711-6579 PT Time Calculation (min) (ACUTE ONLY): 12 min   Charges:   PT Evaluation $PT Eval Low Complexity: 1 Low          Kreg Shropshire, DPT 08/24/2018, 4:48 PM

## 2018-08-24 NOTE — Progress Notes (Signed)
Pt has been discharged home.  Discharge papers given and explained to pt and daughter. Daughter verbalized understanding.  Meds and f/u appointments reviewed. Rx given.

## 2018-08-24 NOTE — ED Triage Notes (Addendum)
Patient to ER  For urinary retention/catheter leaking. Patient c/o pain to lower abdomen, states "it's backing up". Patient was just discharged from hospital for dehydration, went home with leg bag. Reports since leaving, urine started leaking from around catheter/at urethra.

## 2018-08-24 NOTE — Progress Notes (Signed)
*  PRELIMINARY RESULTS* Echocardiogram 2D Echocardiogram has been performed.  Wallie Char Chamberlain Steinborn 08/24/2018, 9:53 AM

## 2018-08-24 NOTE — Discharge Summary (Signed)
[facility] NAME: [name]    MR#:  [id number]  DATE OF BIRTH:  02-Mar-1925  DATE OF ADMISSION:  08/23/2018 ADMITTING PHYSICIAN: Charlene Lighter, MD  DATE OF DISCHARGE: No discharge date for patient encounter.  PRIMARY CARE PHYSICIAN: Charlene Guise, MD    ADMISSION DIAGNOSIS:  Syncope [R55] Generalized weakness [R53.1] Syncope, unspecified syncope type [R55]  DISCHARGE DIAGNOSIS:  Active Problems:   Syncope   SECONDARY DIAGNOSIS:   Past Medical History:  Diagnosis Date  . Allergy    environmental  . Anxiety   . Chronic back pain   . High cholesterol   . History of urinary retention   . Hypertension   . Migraines   . Osteoporosis   . Pneumonia   . Sinusitis     HOSPITAL COURSE:   [name]  is a 84 y.o. female with a known history of severe anxiety, osteoporosis, hypertension, migraines, urinary retention with Foley catheter now, history of recurrent UTIs and chronic hyponatremia presents to hospital secondary to syncope and weakness.  *Syncope, acute Suspected due to vasovagal phenomena  Orthostatic vital signs were negative, carotid Dopplers negative for any hemodynamically significant stenosis, echocardiogram has shown stage I diastolic dysfunction/normal ejection fraction, and patient did well   *Chronic urinary retention Foley was continued Continued Flomax   *Severe anxiety discharged on few pills of Ativan as her PCP discontinued Ativan Patient cannot function without the Ativan, extremely paranoid/anxious, Remeron at bedtime Palliative care consult requested for goals of care conversation  *Hypertension Episode of hypotension early in the morning, Coreg decreased while in house, continue Norvasc   *Chronic hyponatremia Most likely secondary to PRN Lasix Treated with IV fluids while in house DISCHARGE CONDITIONS:   stable  CONSULTS OBTAINED:    DRUG ALLERGIES:    Allergies  Allergen Reactions  . Ceftin [Cefuroxime]   . Cortisone Other (See Comments)    Headache   . Fluticasone Swelling    Facial  . Levofloxacin Nausea And Vomiting  . Meperidine Other (See Comments)  . Morphine And Related Other (See Comments)    headache  . Morphine Sulfate   . Nisoldipine   . Penicillin G Potassium In D5w   . Prednisone Other (See Comments)    Headache   . Sulfa Antibiotics Nausea Only    DISCHARGE MEDICATIONS:   Allergies as of 08/24/2018      Reactions   Ceftin [cefuroxime]    Cortisone Other (See Comments)   Headache   Fluticasone Swelling   Facial   Levofloxacin Nausea And Vomiting   Meperidine Other (See Comments)   Morphine And Related Other (See Comments)   headache   Morphine Sulfate    Nisoldipine    Penicillin G Potassium In D5w    Prednisone Other (See Comments)   Headache   Sulfa Antibiotics Nausea Only      Medication List    TAKE these medications   amLODipine 5 MG tablet Commonly known as:  NORVASC Take 1 tablet (5 mg total) by mouth daily.   carvedilol 3.125 MG tablet Commonly known as:  COREG Take 2 tablets (6.25 mg total) by mouth 2 (two) times daily with a meal. What changed:  medication strength   furosemide 20 MG tablet Commonly known as:  LASIX Take 1 tablet po QD prn What changed:    how much to take  how to take this  when to take this  reasons to take  this   HYDROcodone-acetaminophen 10-325 MG tablet Commonly known as:  NORCO Take 1 tablet by mouth every 4 (four) hours as needed for moderate pain.   LORazepam 0.5 MG tablet Commonly known as:  ATIVAN Take 1 tablet (0.5 mg total) by mouth 3 (three) times daily as needed for anxiety.   mirtazapine 30 MG tablet Commonly known as:  REMERON Take 1 tablet (30 mg total) by mouth at bedtime.   mupirocin ointment 2 % Commonly known as:  BACTROBAN Place 1 application into the nose 2 (two) times daily.   oxybutynin 5 MG tablet Commonly known  as:  DITROPAN Take 1 tablet (5 mg total) by mouth 2 (two) times daily as needed for bladder spasms.   tamsulosin 0.4 MG Caps capsule Commonly known as:  FLOMAX Take 1 capsule (0.4 mg total) by mouth daily.        DISCHARGE INSTRUCTIONS:  If you experience worsening of your admission symptoms, develop shortness of breath, life threatening emergency, suicidal or homicidal thoughts you must seek medical attention immediately by calling 911 or calling your MD immediately  if symptoms less severe.  You Must read complete instructions/literature along with all the possible adverse reactions/side effects for all the Medicines you take and that have been prescribed to you. Take any new Medicines after you have completely understood and accept all the possible adverse reactions/side effects.   Please note  You were cared for by a hospitalist during your hospital stay. If you have any questions about your discharge medications or the care you received while you were in the hospital after you are discharged, you can call the unit and asked to speak with the hospitalist on call if the hospitalist that took care of you is not available. Once you are discharged, your primary care physician will handle any further medical issues. Please note that NO REFILLS for any discharge medications will be authorized once you are discharged, as it is imperative that you return to your primary care physician (or establish a relationship with a primary care physician if you do not have one) for your aftercare needs so that they can reassess your need for medications and monitor your lab values.    Today   CHIEF COMPLAINT:   Chief Complaint  Patient presents with  . Weakness    HISTORY OF PRESENT ILLNESS:  84 y.o. female with a known history of severe anxiety, osteoporosis, hypertension, migraines, urinary retention with Foley catheter now, history of recurrent UTIs and chronic hyponatremia presents to hospital  secondary to syncope and weakness. Patient lives at home with daughter [name].  Ms. [name] is at bedside.  She was just discharged from the hospital about a month ago.  She is extremely anxious at baseline.  She takes Ativan at home.  Patient was in the ED about 2 weeks ago for urinary retention and was discharged with the Foley catheter and was started on Flomax and advised to follow-up with urology.  She was in the ED yesterday complaining of abdominal pain, urine analysis did not reveal any infection.  Blood work was within normal limits, vitals were stable.  CT of the abdomen was within normal limits.  She was discharged home.  Morning she was eating breakfast and had a syncopal episode after a few minutes until the EMS arrived.  She ambulates with a walker at baseline but has started to become extremely weak later in the day and could not even move.  Denies any fevers or chills.  No nausea or vomiting.  Has chronic low back pain.  Abdominal pain intermittently.  Lab work here again is within normal limits. VITAL SIGNS:  Blood pressure 139/86, pulse 82, temperature 98 F (36.7 C), temperature source Oral, resp. rate 16, height 5' (1.524 m), weight 63 kg, SpO2 95 %.  I/O:    Intake/Output Summary (Last 24 hours) at 08/24/2018 1254 Last data filed at 08/24/2018 0525 Gross per 24 hour  Intake 500 ml  Output 1000 ml  Net -500 ml    PHYSICAL EXAMINATION:  GENERAL:  84 y.o.-year-old patient lying in the bed with no acute distress.  EYES: Pupils equal, round, reactive to light and accommodation. No scleral icterus. Extraocular muscles intact.  HEENT: Head atraumatic, normocephalic. Oropharynx and nasopharynx clear.  NECK:  Supple, no jugular venous distention. No thyroid enlargement, no tenderness.  LUNGS: Normal breath sounds bilaterally, no wheezing, rales,rhonchi or crepitation. No use of accessory muscles of respiration.  CARDIOVASCULAR: S1, S2 normal. No murmurs, rubs, or gallops.  ABDOMEN:  Soft, non-tender, non-distended. Bowel sounds present. No organomegaly or mass.  EXTREMITIES: No pedal edema, cyanosis, or clubbing.  NEUROLOGIC: Cranial nerves II through XII are intact. Muscle strength 5/5 in all extremities. Sensation intact. Gait not checked.  PSYCHIATRIC: The patient is alert and oriented x 3.  SKIN: No obvious rash, lesion, or ulcer.   DATA REVIEW:   CBC Recent Labs  Lab 08/24/18 0523  WBC 6.4  HGB 11.7*  HCT 36.1  PLT 182    Chemistries  Recent Labs  Lab 08/22/18 1452  08/24/18 0523  NA 133*   < > 135  K 3.8   < > 4.0  CL 99   < > 102  CO2 26   < > 29  GLUCOSE 104*   < > 96  BUN 10   < > 10  CREATININE 0.51   < > 0.45  CALCIUM 10.1   < > 9.4  AST 25  --   --   ALT 15  --   --   ALKPHOS 73  --   --   BILITOT 1.1  --   --    < > = values in this interval not displayed.    Cardiac Enzymes No results for input(s): TROPONINI in the last 168 hours.  Microbiology Results  Results for orders placed or performed during the hospital encounter of 02/14/18  Urine culture     Status: None   Collection Time: 02/14/18  6:39 PM  Result Value Ref Range Status   Specimen Description   Final    URINE, RANDOM Performed at [facility], [address] [place], [place] [address]    Special Requests   Final    NONE Performed at [facility], [address], [place], [place] [address]    Culture   Final    NO GROWTH Performed at [facility], [place] [address] [place], [place] [address]    Report Status 02/16/2018 FINAL  Final    RADIOLOGY:  Ct Head Wo Contrast  Result Date: 08/23/2018 CLINICAL DATA:  84 year old female with progressed weakness and headache since yesterday. EXAM: CT HEAD WITHOUT CONTRAST TECHNIQUE: Contiguous axial images were obtained from the base of the skull through the vertex without intravenous contrast. COMPARISON:  03/22/2018 and earlier. FINDINGS: Brain: Stable cerebral volume. No midline shift,  ventriculomegaly, mass effect, evidence of mass lesion, intracranial hemorrhage or evidence of cortically based acute infarction. Patchy and confluent bilateral cerebral  white matter hypodensity is stable. No cortical encephalomalacia identified. Vascular: Calcified atherosclerosis at the skull base. Mild generalized intracranial artery ectasia. No suspicious intracranial vascular hyperdensity. Skull: Stable. Osteopenia. Sinuses/Orbits: Visualized paranasal sinuses and mastoids are stable and well pneumatized. Other: Stable, negative. IMPRESSION: No acute intracranial abnormality. Stable non contrast CT appearance of the brain since June. Electronically Signed   By: Genevie Ann M.D.   On: 08/23/2018 16:37   Ct Abdomen Pelvis W Contrast  Result Date: 08/22/2018 CLINICAL DATA:  Lower quadrant abdominal pain, no bowel movement for 4 days, LEFT lower quadrant pain with palpation, infilling Foley catheter being followed by urology, history hypertension, emphysema EXAM: CT ABDOMEN AND PELVIS WITH CONTRAST TECHNIQUE: Multidetector CT imaging of the abdomen and pelvis was performed using the standard protocol following bolus administration of intravenous contrast. Sagittal and coronal MPR images reconstructed from axial data set. CONTRAST:  166mL ISOVUE-300 IOPAMIDOL (ISOVUE-300) INJECTION 61% IV. No oral contrast. COMPARISON:  08/12/2018 FINDINGS: Lower chest: Minimal bibasilar atelectasis Hepatobiliary: Minimal intrahepatic biliary dilatation centrally, potentially related age. No extrahepatic biliary dilatation. Gallbladder and liver otherwise normal appearance. Pancreas: Normal appearance Spleen: Normal appearance Adrenals/Urinary Tract: Adrenal glands normal appearance. Small upper pole LEFT renal cyst. Kidneys, ureters, and bladder normal appearance. Catheter within bladder. Stomach/Bowel: Appendix surgically absent by history. Stomach decompressed. Bowel loops unremarkable. Vascular/Lymphatic: Atherosclerotic  calcifications aorta and iliac arteries. Aorta mildly tortuous but normal in caliber. Mild enlargement of cardiac chambers. Numerous pelvic phleboliths. Reproductive: Uterus surgically absent. Nonvisualization of LEFT ovary. Questionable visualization of a normal sized RIGHT ovary. Other: No free air or free fluid. Questionable small RIGHT inguinal hernia containing fat. Musculoskeletal: Osseous demineralization. Prior lumbar fusion L3-S1 with associated beam hardening artifacts. Anterolisthesis L3-L4. Superior endplate compression deformity of L5. Lumbar spine finding stable. Pelvis intact. IMPRESSION: No acute intra-abdominal or intrapelvic abnormalities. Questionable small RIGHT inguinal hernia containing fat. Aortic Atherosclerosis (ICD10-I70.0). Electronically Signed   By: Lavonia Dana M.D.   On: 08/22/2018 15:50   US Carotid Bilateral  Result Date: 08/24/2018 CLINICAL DATA:  Syncope EXAM: BILATERAL CAROTID DUPLEX ULTRASOUND TECHNIQUE: Pearline Cables scale imaging, color Doppler and duplex ultrasound were performed of bilateral carotid and vertebral arteries in the neck. COMPARISON:  None. FINDINGS: Criteria: Quantification of carotid stenosis is based on velocity parameters that correlate the residual internal carotid diameter with NASCET-based stenosis levels, using the diameter of the distal internal carotid lumen as the denominator for stenosis measurement. The following velocity measurements were obtained: RIGHT ICA: 48 cm/sec CCA: 69 cm/sec SYSTOLIC ICA/CCA RATIO:  0.7 ECA: 84 cm/sec LEFT ICA: 58 cm/sec CCA: 72 cm/sec SYSTOLIC ICA/CCA RATIO:  0.8 ECA: 96 cm/sec RIGHT CAROTID ARTERY: Mild smooth calcified plaque in the bulb. Low resistance internal carotid Doppler pattern. RIGHT VERTEBRAL ARTERY:  Antegrade. LEFT CAROTID ARTERY: Mild smooth calcified plaque in the bulb. Low resistance internal carotid Doppler pattern is preserved. LEFT VERTEBRAL ARTERY:  Antegrade. Additional findings: The thyroid gland is  incidentally noted to be diffusely heterogeneous without discrete nodule. IMPRESSION: Less than 50% stenosis in the right and left internal carotid arteries. Electronically Signed   By: Marybelle Killings M.D.   On: 08/24/2018 11:03    EKG:   Orders placed or performed during the hospital encounter of 08/23/18  . EKG 12-Lead  . EKG 12-Lead  . ED EKG  . ED EKG      Management plans discussed with the patient, family and they are in agreement.  CODE STATUS:     Code Status Orders  (From  admission, onward)         Start     Ordered   08/23/18 1856  Full code  Continuous     08/23/18 1855        Code Status History    Date Active Date Inactive Code Status Order ID Comments User Context   07/29/2018 1540 08/01/2018 2107 Full Code [id number]  Saundra Shelling, MD Inpatient   08/04/2017 1650 08/07/2017 1850 Full Code [id number]  Bettey Costa, MD Inpatient   08/04/2017 1547 08/04/2017 1650 Full Code [id number]  Bettey Costa, MD ED   12/15/2015 0814 12/19/2015 1632 Full Code [id number]  Saundra Shelling, MD Inpatient    Advance Directive Documentation     Most Recent Value  Type of Advance Directive  Healthcare Power of Attorney  Pre-existing out of facility DNR order (yellow form or pink MOST form)  -  "MOST" Form in Place?  -      TOTAL TIME TAKING CARE OF THIS PATIENT: 40 minutes.    Avel Peace Pal Shell M.D on 08/24/2018 at 12:54 PM  Between 7am to 6pm - Pager - [phone]  After 6pm go to [email or web address] - password [email or web address] [facility]  Office  [phone]  CC: Primary care physician; Charlene Guise, MD   Note: This dictation was prepared with Dragon dictation along with smaller phrase technology. Any transcriptional errors that result from this process are unintentional.

## 2018-08-24 NOTE — ED Notes (Signed)
[name] reports that catheter that was placed on 11/2 was replaced yesterday before discharge from this hospital.

## 2018-08-25 ENCOUNTER — Ambulatory Visit: Payer: Medicare Other

## 2018-08-25 DIAGNOSIS — I959 Hypotension, unspecified: Secondary | ICD-10-CM | POA: Diagnosis not present

## 2018-08-25 MED ORDER — CEPHALEXIN 500 MG PO CAPS: 500.0000 mg | ORAL_CAPSULE | Freq: Two times a day (BID) | ORAL | 0 refills | Status: AC

## 2018-08-25 MED ORDER — CEPHALEXIN 500 MG PO CAPS
500.0000 mg | ORAL_CAPSULE | Freq: Once | ORAL | Status: AC
  Administered 2018-08-25: 500 mg via ORAL
  Filled 2018-08-25: qty 1

## 2018-08-25 MED ORDER — LIDOCAINE HCL URETHRAL/MUCOSAL 2 % EX GEL
1.0000 "application " | Freq: Once | CUTANEOUS | Status: AC
  Administered 2018-08-25: 1 via URETHRAL

## 2018-08-25 NOTE — ED Notes (Signed)
Reviewed discharge instructions, follow-up care, and prescriptions with patient. Patient verbalized understanding of all information reviewed. Patient stable, with no distress noted at this time.    

## 2018-08-25 NOTE — ED Provider Notes (Signed)
[facility] Emergency Department Provider Note _________   First MD Initiated Contact with Patient 08/24/18 2331     (approximate)  I have reviewed the triage vital signs and the nursing notes.   HISTORY  Chief Complaint Catheter complication    HPI [name] is a 84 y.o. female discharge from the hospital today presents to the emergency department with urine leaking around the Foley catheter.  Patient and daughter states that the patient normally has a size 49 French Foley catheter and however when the catheter was placed before discharge they placed a size 14.  Patient does admit to suprapubic discomfort at this time.  Patient denies any fever   Past Medical History:  Diagnosis Date  . Allergy    environmental  . Anxiety   . Chronic back pain   . High cholesterol   . History of urinary retention   . Hypertension   . Migraines   . Osteoporosis   . Pneumonia   . Sinusitis     Patient Active Problem List   Diagnosis Date Noted  . Syncope 08/23/2018  . Pressure injury of skin 07/29/2018  . Cervical lymphadenopathy 07/18/2018  . Pain in right hip 07/18/2018  . Acute recurrent sinusitis 06/25/2018  . Lymphadenopathy 06/25/2018  . Stomatitis and mucositis 06/25/2018  . Chest pain 02/16/2018  . Shortness of breath 02/16/2018  . Other fatigue 02/16/2018  . Arm fracture, left 01/03/2018  . Arthritis 01/03/2018  . Compression fracture of thoracic vertebra ([place]) 01/03/2018  . Emphysema lung ([place]) 01/03/2018  . GERD (gastroesophageal reflux disease) 01/03/2018  . GI bleeding 01/03/2018  . Lumbar spondylosis 01/03/2018  . Menstrual problem 01/03/2018  . Osteoarthritis of knee 01/03/2018  . Osteoporosis 01/03/2018  . Acute pain of right knee 01/03/2018  . Ankle sprain 01/03/2018  . Synovial cyst of popliteal space 01/03/2018  . History of urinary retention 11/10/2017  . Generalized anxiety disorder 10/24/2017  . Constipation 10/24/2017   . Internal hemorrhoids 10/24/2017  . Essential (primary) hypertension 10/24/2017  . Thrombocytopenia ([place]) 10/24/2017  . Acute encephalopathy 08/04/2017  . Hyponatremia 12/15/2015    Past Surgical History:  Procedure Laterality Date  . ABDOMINAL HYSTERECTOMY    . APPENDECTOMY    . BACK SURGERY    . BREAST BIOPSY Right 2002   neg  . HEMORRHOID SURGERY    . TONSILLECTOMY      Prior to Admission medications   Medication Sig Start Date End Date Taking? Authorizing Provider  amLODipine (NORVASC) 5 MG tablet Take 1 tablet (5 mg total) by mouth daily. 04/28/18   Lavera Guise, MD  carvedilol (COREG) 3.125 MG tablet Take 2 tablets (6.25 mg total) by mouth 2 (two) times daily with a meal. 08/24/18   Salary, Avel Peace, MD  cephALEXin (KEFLEX) 500 MG capsule Take 1 capsule (500 mg total) by mouth 2 (two) times daily for 10 days. 08/25/18 09/04/18  Gregor Hams, MD  furosemide (LASIX) 20 MG tablet Take 1 tablet po QD prn Patient taking differently: Take 20 mg by mouth daily as needed. Take 1 tablet po QD prn 04/28/18   Lavera Guise, MD  HYDROcodone-acetaminophen [facility]) 10-325 MG tablet Take 1 tablet by mouth every 4 (four) hours as needed for moderate pain.     [provider]  LORazepam (ATIVAN) 0.5 MG tablet Take 1 tablet (0.5 mg total) by mouth 3 (three) times daily as needed for anxiety. 08/24/18   Salary, Avel Peace, MD  mirtazapine (REMERON) 30 MG tablet Take 1 tablet (30 mg total) by mouth at bedtime. 06/16/18   Ronnell Freshwater, NP  mupirocin ointment (BACTROBAN) 2 % Place 1 application into the nose 2 (two) times daily. 03/15/18   Ronnell Freshwater, NP  oxybutynin (DITROPAN) 5 MG tablet Take 1 tablet (5 mg total) by mouth 2 (two) times daily as needed for bladder spasms. Patient not taking: Reported on 08/22/2018 08/01/18   Gladstone Lighter, MD  tamsulosin (FLOMAX) 0.4 MG CAPS capsule Take 1 capsule (0.4 mg total) by mouth daily. 04/21/18   Zara Council A, PA-C     Allergies Ceftin [cefuroxime]; Cortisone; Fluticasone; Levofloxacin; Meperidine; Morphine and related; Morphine sulfate; Nisoldipine; Penicillin g potassium in d5w; Prednisone; and Sulfa antibiotics  Family History  Problem Relation Age of Onset  . Breast cancer Sister 41  . Breast cancer Maternal Grandmother 67  . Breast cancer Sister 3  . Bladder Cancer Brother   . Hypertension Neg Hx   . Kidney cancer Neg Hx     Social History Social History   Tobacco Use  . Smoking status: Never Smoker  . Smokeless tobacco: Never Used  Substance Use Topics  . Alcohol use: No  . Drug use: No    Review of Systems Constitutional: No fever/chills Eyes: No visual changes. ENT: No sore throat. Cardiovascular: Denies chest pain. Respiratory: Denies shortness of breath. Gastrointestinal: No abdominal pain.  No nausea, no vomiting.  No diarrhea.  No constipation. Genitourinary: Positive for suprapubic discomfort and urine leaking around Foley catheter. Musculoskeletal: Negative for neck pain.  Negative for back pain. Integumentary: Negative for rash. Neurological: Negative for headaches, focal weakness or numbness.  ____________________________________________   PHYSICAL EXAM:  VITAL SIGNS: ED Triage Vitals  Enc Vitals Group     BP 08/24/18 2315 (!) 151/93     Pulse Rate 08/24/18 2315 100     Resp 08/24/18 2315 20     Temp 08/24/18 2315 97.7 F (36.5 C)     Temp Source 08/24/18 2315 Oral     SpO2 08/24/18 2315 97 %     Weight 08/24/18 2316 63 kg (138 lb 14.2 oz)     Height 08/24/18 2316 1.524 m (5')     Head Circumference --      Peak Flow --      Pain Score 08/24/18 2315 10     Pain Loc --      Pain Edu? --      Excl. in [place]? --     Constitutional: Alert and oriented. Well appearing and in no acute distress. Eyes: Conjunctivae are normal.  Mouth/Throat: Mucous membranes are moist.  Oropharynx non-erythematous. Neck: No stridor.   Cardiovascular: Normal rate, regular  rhythm. Good peripheral circulation. Grossly normal heart sounds. Respiratory: Normal respiratory effort.  No retractions. Lungs CTAB. Gastrointestinal: Soft and nontender. No distention.  Genitourinary:  Musculoskeletal: No lower extremity tenderness nor edema. No gross deformities of extremities. Neurologic:  Normal speech and language. No gross focal neurologic deficits are appreciated.  Skin:  Skin is warm, dry and intact. No rash noted. Psychiatric: Mood and affect are normal. Speech and behavior are normal.  ____________________________________________   LABS (all labs ordered are listed, but only abnormal results are displayed)  Labs Reviewed  URINALYSIS, COMPLETE (UACMP) WITH MICROSCOPIC - Abnormal; Notable for the following components:      Result Value   Color, Urine AMBER (*)    APPearance CLOUDY (*)    Hgb urine dipstick LARGE (*)  Protein, ur 100 (*)    Leukocytes, UA LARGE (*)    RBC / HPF >50 (*)    WBC, UA >50 (*)    Bacteria, UA MANY (*)    All other components within normal limits  URINE CULTURE      Procedures   ____________________________________________   INITIAL IMPRESSION / ASSESSMENT AND PLAN / ED COURSE  As part of my medical decision making, I reviewed the following data within the electronic MEDICAL RECORD NUMBER  84 year old female presenting with above-stated history and physical exam secondary to urinary catheter complication.  Catheter was replaced 16 [place] inserted.  Urine noted to be markedly cloudy and as such urinalysis was obtained which revealed findings consistent with a urinary tract infection.  [name] states that Keflex has been effective in the past in regards to the patient's urinary tract infection and as such it was prescribed. ____________________________________________  FINAL CLINICAL IMPRESSION(S) / ED DIAGNOSES  Final diagnoses:  Complication of Foley catheter, initial encounter ([place])  Acute cystitis without  hematuria     MEDICATIONS GIVEN DURING THIS VISIT:  Medications  cephALEXin (KEFLEX) capsule 500 mg (has no administration in time range)  lidocaine (XYLOCAINE) 2 % jelly 1 application (1 application Urethral Given 08/25/18 0005)     ED Discharge Orders         Ordered    cephALEXin (KEFLEX) 500 MG capsule  2 times daily     08/25/18 0016           Note:  This document was prepared using Dragon voice recognition software and may include unintentional dictation errors.    Gregor Hams, MD 08/25/18 [phone]

## 2018-08-27 LAB — URINE CULTURE: Culture: >=100000 — AB

## 2018-08-31 ENCOUNTER — Telehealth: Payer: Self-pay

## 2018-08-31 ENCOUNTER — Ambulatory Visit: Payer: Medicare Other

## 2018-08-31 DIAGNOSIS — R339 Retention of urine, unspecified: Secondary | ICD-10-CM

## 2018-08-31 DIAGNOSIS — N2889 Other specified disorders of kidney and ureter: Secondary | ICD-10-CM | POA: Diagnosis not present

## 2018-08-31 LAB — URINALYSIS, COMPLETE
Bilirubin, UA: NEGATIVE
Glucose, UA: NEGATIVE
KETONES UA: NEGATIVE
NITRITE UA: NEGATIVE
PH UA: 7.5 (ref 5.0–7.5)
Protein, UA: NEGATIVE
SPEC GRAV UA: 1.01 (ref 1.005–1.030)
UUROB: 0.2 mg/dL (ref 0.2–1.0)

## 2018-08-31 LAB — MICROSCOPIC EXAMINATION
EPITHELIAL CELLS (NON RENAL): NONE SEEN /HPF (ref 0–10)
RBC, UA: NONE SEEN /hpf (ref 0–2)

## 2018-08-31 NOTE — Telephone Encounter (Signed)
Called pt's daughter [name] to inquire about patient's wellbeing. Daughter states that patient is doing well. Patient has been able to void on her own multiple times.

## 2018-08-31 NOTE — Progress Notes (Signed)
Catheter Removal  Patient is present today for a catheter removal.  50ml of water was drained from the balloon. A 16FR foley cath was removed from the bladder no complications were noted . Patient tolerated well. Patient advised on the importance of taking Flomax as she states she forgets. Per Dr.Stoioff pt advised to d/c her keflex as she states urine specimen in ED was obtained from her catheter bag. Cath U/A and culture was performed today. Will call pt with results.   Preformed by: Gordy Clement, CMA (AAMA)  Follow up/ Additional notes: [name] advised to call back this afternoon with update on if patient is able to void on her own as daughter is unable to RTC this afternoon for PVR.

## 2018-09-01 ENCOUNTER — Ambulatory Visit: Payer: Medicare Other | Admitting: Nurse Practitioner

## 2018-09-03 LAB — CULTURE, URINE COMPREHENSIVE

## 2018-09-04 ENCOUNTER — Other Ambulatory Visit: Payer: Self-pay

## 2018-09-04 ENCOUNTER — Ambulatory Visit (INDEPENDENT_AMBULATORY_CARE_PROVIDER_SITE_OTHER): Payer: Medicare Other | Admitting: Nurse Practitioner

## 2018-09-04 ENCOUNTER — Encounter: Payer: Self-pay | Admitting: Nurse Practitioner

## 2018-09-04 ENCOUNTER — Telehealth: Payer: Self-pay

## 2018-09-04 VITALS — BP 136/82 | HR 101 | Temp 97.8°F | Ht 60.0 in | Wt 122.8 lb

## 2018-09-04 DIAGNOSIS — H3530 Unspecified macular degeneration: Secondary | ICD-10-CM | POA: Insufficient documentation

## 2018-09-04 DIAGNOSIS — Z7689 Persons encountering health services in other specified circumstances: Secondary | ICD-10-CM | POA: Diagnosis not present

## 2018-09-04 DIAGNOSIS — F411 Generalized anxiety disorder: Secondary | ICD-10-CM | POA: Diagnosis not present

## 2018-09-04 MED ORDER — LORAZEPAM 0.5 MG PO TABS: 0.5000 mg | ORAL_TABLET | Freq: Three times a day (TID) | ORAL | 1 refills | Status: DC | PRN

## 2018-09-04 MED ORDER — BUSPIRONE HCL 5 MG PO TABS: 5.0000 mg | ORAL_TABLET | Freq: Two times a day (BID) | ORAL | 2 refills | Status: DC

## 2018-09-04 MED ORDER — LORAZEPAM 0.5 MG PO TABS: 0.5000 mg | ORAL_TABLET | Freq: Three times a day (TID) | ORAL | 0 refills | Status: DC | PRN

## 2018-09-04 NOTE — Telephone Encounter (Signed)
Informed patient and daughter [name] of urine culture results and recommendation.

## 2018-09-04 NOTE — Patient Instructions (Addendum)
[name],   Thank you for coming in to clinic today.  1. Continue ativan without changes.  You may be able to reduce how often she takes this when we add buspirone. - Ativan is a rescue anxiety medicine.  2. START buspirone 5 mg one tablet once daily for 7-14 days.  Then, increase to 1 tablet twice daily and continue.  This is for baseline anxiety control.  - Watch for increase in confusion, headaches, tremor, or nausea.  These would be side effects to stop this medicine.  Call clinic if these side effects start.  Please schedule a follow-up appointment with Cassell Smiles, AGNP. Return in about 2 months (around 11/04/2018) for anxiety.  If you have any other questions or concerns, please feel free to call the clinic or send a message through [place]. You may also schedule an earlier appointment if necessary.  You will receive a survey after today's visit either digitally by e-mail or paper by [employer]. Your experiences and feedback matter to [place].  Please respond so we know how we are doing as we provide care for you.   Cassell Smiles, DNP, AGNP-BC Adult Gerontology Nurse Practitioner [facility], [facility]   Serotonin Syndrome Serotonin is a brain chemical that regulates the nervous system, which includes the brain, spinal cord, and nerves. Serotonin appears to play a role in all types of behavior, including appetite, emotions, movement, thinking, and response to stress. Excessively high levels of serotonin in the body can cause serotonin syndrome, which is a very dangerous condition. What are the causes? This condition can be caused by taking medicines or drugs that increase the level of serotonin in your body. These include:  Antidepressant medicines.  Migraine medicines.  Certain pain medicines.  Certain recreational drugs, including ecstasy, LSD, cocaine, and amphetamines.  Over-the-counter cough or cold medicines that contain dextromethorphan.  Certain  herbal supplements, including St. John's wort, ginseng, and nutmeg.  This condition usually occurs when you take these medicines or drugs in combination, but it can also happen with a high dose of a single medicine or drug. What increases the risk? This condition is more likely to develop in:  People who have recently increased the dosage of medicine that increases the serotonin level.  People who just started taking medicine that increases the serotonin level.  What are the signs or symptoms? Symptoms of this condition usually happens within several hours of a medicine change. Symptoms include:  Headache.  Muscle twitching or stiffness.  Diarrhea.  Confusion.  Restlessness or agitation.  Shivering or goose bumps.  Loss of muscle coordination.  Rapid heart rate.  Sweating.  Severe cases of serotonin syndromecan cause:  Irregular heartbeat.  Seizures.  Loss of consciousness.  High fever.  How is this diagnosed? This condition is diagnosed with a medical history and physical exam. You will be asked aboutyour symptoms and your use of medicines and recreational drugs. Your health care provider may also order lab work or additional tests to rule out other causes of your symptoms. How is this treated? The treatment for this condition depends on the severity of your symptoms. For mild cases, stopping the medicine that caused your condition is usually all that is needed. For moderate to severe cases, hospitalization is required to monitor you and to prevent further muscle damage. Follow these instructions at home:  Take over-the-counter and prescription medicines only as told by your health care provider. This is important.  Check with your health care  provider before you start taking any new prescriptions, over-the-counter medicines, herbs, or supplements.  Avoid combining any medicines that can cause this condition to occur.  Keep all follow-up visits as told by your  health care provider.This is important.  Maintain a healthy lifestyle. ? Eat healthy foods. ? Get plenty of sleep. ? Exercise regularly. ? Do not drink alcohol. ? Do not use recreational drugs. Contact a health care provider if:  Medicines do not seem to be helping.  Your symptoms do not improve or they get worse.  You have trouble taking care of yourself. Get help right away if:  You have worsening confusion, severe headache, chest pain, high fever, seizures, or loss of consciousness.  You have serious thoughts about hurting yourself or others.  You experience serious side effects of medicine, such as swelling of your face, lips, tongue, or throat. This information is not intended to replace advice given to you by your health care provider. Make sure you discuss any questions you have with your health care provider. Document Released: 11/04/2004 Document Revised: 05/22/2016 Document Reviewed: 10/10/2014 Elsevier Interactive Patient Education  [employer].

## 2018-09-04 NOTE — Progress Notes (Signed)
Subjective:    Patient ID: [name], female    DOB: March 15, 1925, 84 y.o.   MRN: [id number]  [name] is a 84 y.o. female presenting on 09/04/2018 for Establish Care (pt needs a refill of ativan)   HPI Establish Care New Provider Pt last seen by PCP [facility] about 3 months ago.  Obtain records.    Anxiety Patient was on Ativan in past for many years, was stopped by Dr[facility] Humphrey Rolls at [facility] because daughter reports patient was running out early.  Daughter cites macular degeneration and patient dropping pills.  It was restarted by ED at some point in last 1-2 months.  Now is needing a refill, has been out again for 2-3 days.  Patient notes she has symptoms of nervousness, shaking, feeling of breathlessness when she is most anxious.  Ativan helps to calm anxiety - not taking at bedtime and not taking with oxycodone doses.  - Has not been on any other anxiety med in past.  - Glenna Fellows and Dr. Ruthann Cancer are prescribing chronic opoioid - neurosurgery in [place].   - Patient has never been on baseline anxiety medication.  Past Medical History:  Diagnosis Date  . Allergy    environmental  . Anxiety   . Chronic back pain   . High cholesterol   . History of urinary retention   . Hypertension   . Migraines   . Osteoporosis   . Pneumonia   . Sinusitis    Past Surgical History:  Procedure Laterality Date  . ABDOMINAL HYSTERECTOMY    . APPENDECTOMY    . BACK SURGERY    . BACK SURGERY  2008   2009  . BREAST BIOPSY Right 2002   neg  . HEMORRHOID SURGERY    . MOUTH SURGERY  2019  . TONSILLECTOMY     Social History   Socioeconomic History  . Marital status: Widowed    Spouse name: Not on file  . Number of children: Not on file  . Years of education: Not on file  . Highest education level: Not on file  Occupational History  . Occupation: retired  Scientific laboratory technician  . Financial resource strain: Not on file  . Food insecurity:    Worry: Not on file    Inability: Not on  file  . Transportation needs:    Medical: Not on file    Non-medical: Not on file  Tobacco Use  . Smoking status: Never Smoker  . Smokeless tobacco: Never Used  Substance and Sexual Activity  . Alcohol use: No  . Drug use: No  . Sexual activity: Not Currently  Lifestyle  . Physical activity:    Days per week: Not on file    Minutes per session: Not on file  . Stress: Not on file  Relationships  . Social connections:    Talks on phone: Not on file    Gets together: Not on file    Attends religious service: Not on file    Active member of club or organization: Not on file    Attends meetings of clubs or organizations: Not on file    Relationship status: Not on file  . Intimate partner violence:    Fear of current or ex partner: Not on file    Emotionally abused: Not on file    Physically abused: Not on file    Forced sexual activity: Not on file  Other Topics Concern  . Not on file  Social  History Narrative   Lives at home with daughter [name], ambulates with a walker   Family History  Problem Relation Age of Onset  . Breast cancer Sister 8  . Breast cancer Maternal Grandmother 74  . Breast cancer Sister 84  . Bladder Cancer Brother   . Hypertension Neg Hx   . Kidney cancer Neg Hx    Current Outpatient Medications on File Prior to Visit  Medication Sig  . amLODipine (NORVASC) 5 MG tablet Take 1 tablet (5 mg total) by mouth daily.  . carvedilol (COREG) 3.125 MG tablet Take 2 tablets (6.25 mg total) by mouth 2 (two) times daily with a meal.  . furosemide (LASIX) 20 MG tablet Take 1 tablet po QD prn (Patient taking differently: Take 20 mg by mouth daily as needed. Take 1 tablet po QD prn)  . HYDROcodone-acetaminophen (NORCO) 10-325 MG tablet Take 1 tablet by mouth every 4 (four) hours as needed for moderate pain.   . mirtazapine (REMERON) 30 MG tablet Take 1 tablet (30 mg total) by mouth at bedtime.  . tamsulosin (FLOMAX) 0.4 MG CAPS capsule Take 1 capsule (0.4 mg total)  by mouth daily.  . mupirocin ointment (BACTROBAN) 2 % Place 1 application into the nose 2 (two) times daily. (Patient not taking: Reported on 09/04/2018)  . oxybutynin (DITROPAN) 5 MG tablet Take 1 tablet (5 mg total) by mouth 2 (two) times daily as needed for bladder spasms. (Patient not taking: Reported on 08/22/2018)   No current facility-administered medications on file prior to visit.     Review of Systems  Constitutional: Positive for chills. Negative for fever.  HENT: Negative for congestion and sore throat.   Eyes: Negative for pain.  Respiratory: Negative for cough, shortness of breath and wheezing.   Cardiovascular: Positive for leg swelling (RLE). Negative for chest pain and palpitations.  Gastrointestinal: Negative for abdominal pain, blood in stool, constipation, diarrhea, nausea and vomiting.  Endocrine: Negative for polydipsia.  Musculoskeletal: Positive for arthralgias, back pain and myalgias. Negative for neck pain.  Skin: Negative.  Negative for rash.  Allergic/Immunologic: Negative for environmental allergies.  Neurological: Negative for dizziness, weakness and headaches.  Hematological: Does not bruise/bleed easily.  Psychiatric/Behavioral: Positive for sleep disturbance. Negative for confusion, dysphoric mood and suicidal ideas. The patient is nervous/anxious.    Per HPI unless specifically indicated above     Objective:    BP 136/82 (BP Location: Right Arm, Patient Position: Sitting, Cuff Size: Normal)   Pulse (!) 101   Temp 97.8 F (36.6 C) (Oral)   Ht 5' (1.524 m)   Wt 122 lb 12.8 oz (55.7 kg)   SpO2 98%   BMI 23.98 kg/m   Wt Readings from Last 3 Encounters:  09/04/18 122 lb 12.8 oz (55.7 kg)  08/24/18 138 lb 14.2 oz (63 kg)  08/23/18 138 lb 14.2 oz (63 kg)    Physical Exam  Constitutional: She is oriented to person, place, and time. She appears well-developed and well-nourished. No distress.  HENT:  Head: Normocephalic and atraumatic.    Cardiovascular: Normal rate, regular rhythm, S1 normal, S2 normal, normal heart sounds and intact distal pulses.  Pulmonary/Chest: Effort normal and breath sounds normal. No respiratory distress.  Neurological: She is alert and oriented to person, place, and time.  Resting tremor  Skin: Skin is warm and dry. Capillary refill takes less than 2 seconds.  Psychiatric: Judgment and thought content normal. Her mood appears anxious. Her speech is rapid and/or pressured and tangential. She  is hyperactive. Cognition and memory are normal. She expresses no homicidal and no suicidal ideation. She expresses no suicidal plans and no homicidal plans.  Vitals reviewed.   Results for orders placed or performed in visit on 08/31/18  CULTURE, URINE COMPREHENSIVE  Result Value Ref Range   Urine Culture, Comprehensive Final report (A)    Organism ID, Bacteria Yeast isolated. (A)   Microscopic Examination  Result Value Ref Range   WBC, UA 0-5 0 - 5 /hpf   RBC, UA None seen 0 - 2 /hpf   Epithelial Cells (non renal) None seen 0 - 10 /hpf   Crystals Present (A) N/A   Crystal Type Calcium Oxalate N/A   Mucus, UA Present (A) Not Estab.   Bacteria, UA Many (A) None seen/Few  Urinalysis, Complete  Result Value Ref Range   Specific Gravity, UA 1.010 1.005 - 1.030   pH, UA 7.5 5.0 - 7.5   Color, UA Yellow Yellow   Appearance Ur Clear Clear   Leukocytes, UA 2+ (A) Negative   Protein, UA Negative Negative/Trace   Glucose, UA Negative Negative   Ketones, UA Negative Negative   RBC, UA Trace (A) Negative   Bilirubin, UA Negative Negative   Urobilinogen, Ur 0.2 0.2 - 1.0 mg/dL   Nitrite, UA Negative Negative   Microscopic Examination See below:       Assessment & Plan:   Problem List Items Addressed This Visit      Other   Generalized anxiety disorder -  Primary Chronic, currently managed with Ativan 0.5 mg po tid prn anxiety.  In review with patient's daughter, Ativan is never taken at same time as her  opioid and is not taken at bedtime with mirtazapine.  Plan: 1. Continue mirtazapine. 2. Continue Ativan 0.5 mg one tab po tid prn anxiety. - Discussed tendency toward worsening confusion - patient's daughter states this is not present, however patient seems slightly confused today and very easily distracted.  She continuously repeats the things that are bothering her as if we forgot she had stated these already. 3. Consider addition of buspiron 5 mg bid.  Cautioned side effects with multiple serotonergic meds.  Daughter aware of side effects to monitor.  Patient is not willing to "change" medicines despite best efforts to describe what our plan is to add and try to reduce Ativan.  Add if patient willing to add in next 1-2 weeks. 4. Follow-up 6 weeks.   Relevant Medications   busPIRone (BUSPAR) 5 MG tablet   LORazepam (ATIVAN) 0.5 MG tablet    Other Visit Diagnoses    Encounter to establish care     Previous PCP was at [facility].  Records will be requested.  Past medical, family, and surgical history reviewed w/ patient and daughter in clinic.        Meds ordered this encounter  Medications  . busPIRone (BUSPAR) 5 MG tablet    Sig: Take 1 tablet (5 mg total) by mouth 2 (two) times daily.    Dispense:  60 tablet    Refill:  2    Order Specific Question:   Supervising Provider    Answer:   Olin Hauser [[id number]]  . LORazepam (ATIVAN) 0.5 MG tablet    Sig: Take 1 tablet (0.5 mg total) by mouth 3 (three) times daily as needed for anxiety.    Dispense:  90 tablet    Refill:  1    Order Specific Question:   Supervising Provider  Answer:   Olin Hauser [[id number]]    Follow up plan: Return in about 2 months (around 11/04/2018) for anxiety.  Cassell Smiles, DNP, AGPCNP-BC Adult Gerontology Primary Care Nurse Practitioner [facility] 09/04/2018, 2:38 PM

## 2018-09-04 NOTE — Telephone Encounter (Signed)
-----   Message from Abbie Sons, MD sent at 09/03/2018 12:12 PM EST ----- Urine culture grew yeast which most likely represents colonization.  Would not recommend treatment

## 2018-09-05 ENCOUNTER — Encounter: Payer: Self-pay | Admitting: Nurse Practitioner

## 2018-09-07 IMAGING — CR DG KNEE COMPLETE 4+V*L*
1 series · 4 of 4 positions shown · non-contrast
Comparison: Concurrently obtained radiographs of lumbar spine and
right knee

CLINICAL DATA: 89-year-old female with a bilateral knee and lower
back pain since falling 1 month previously

EXAM:
LEFT KNEE - COMPLETE 4+ VIEW

[Series 1: dg knee complete 4 views left · 0.14mm/px · 4 of 4 slices shown]
[im 1/4]
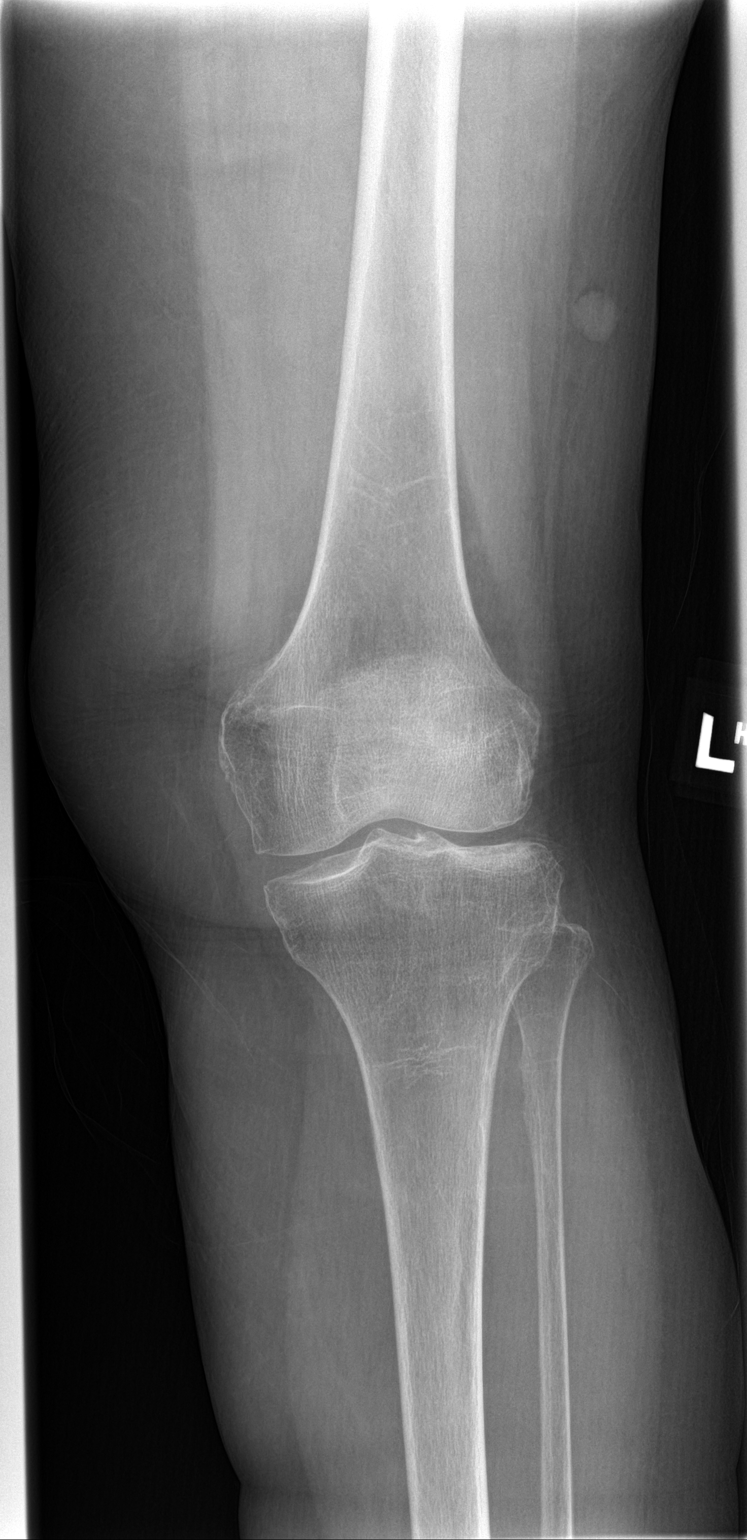
[im 2/4]
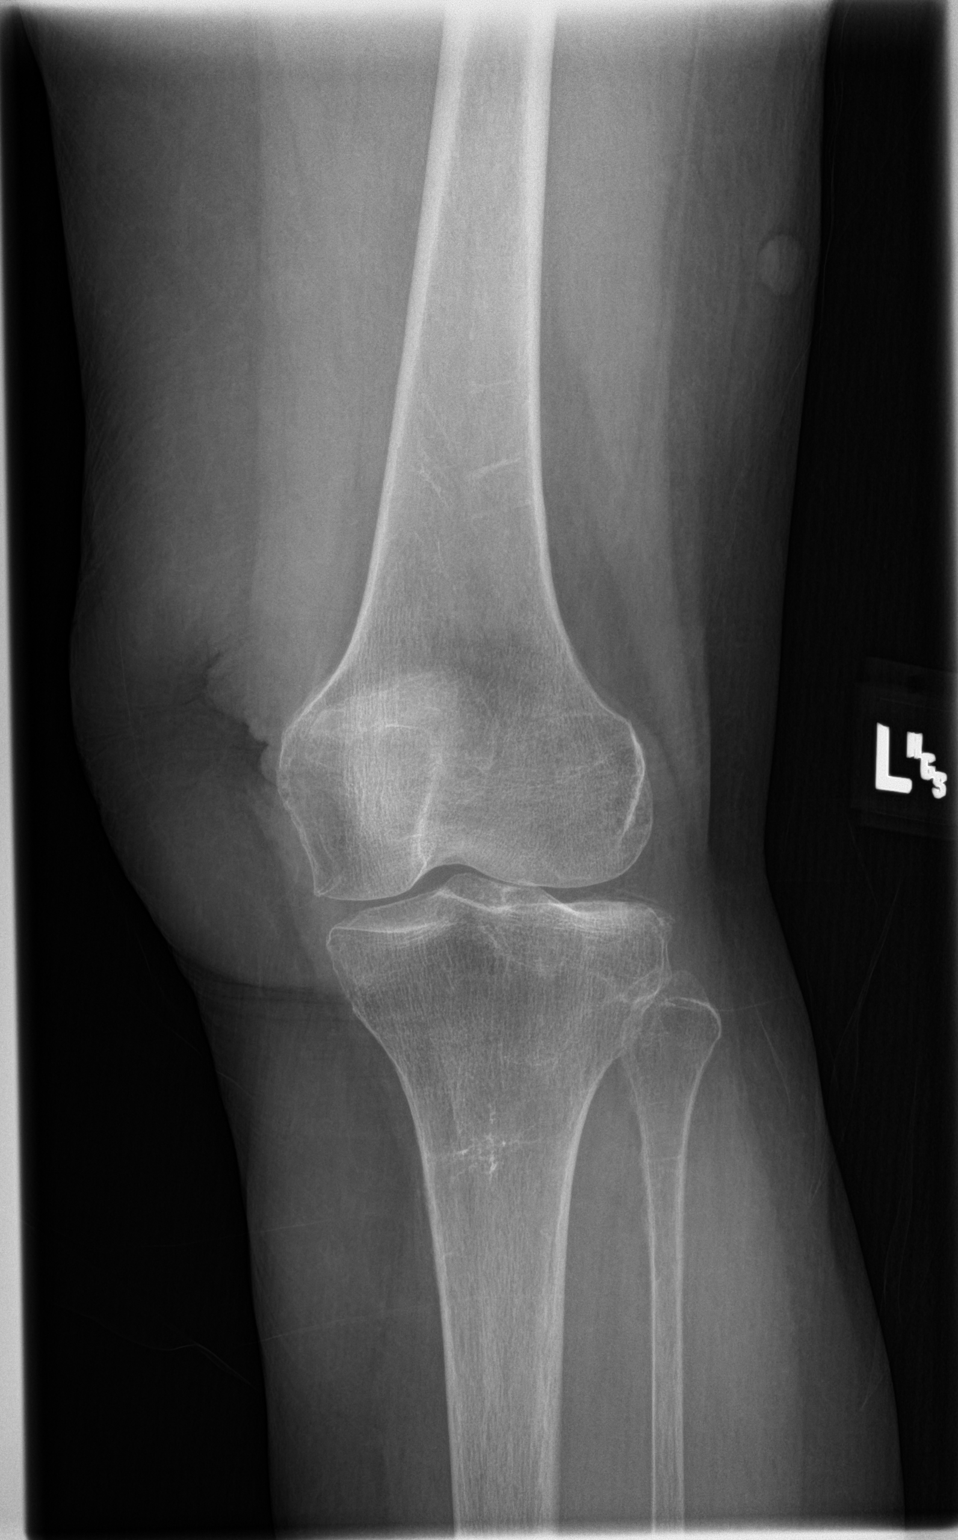
[im 3/4]
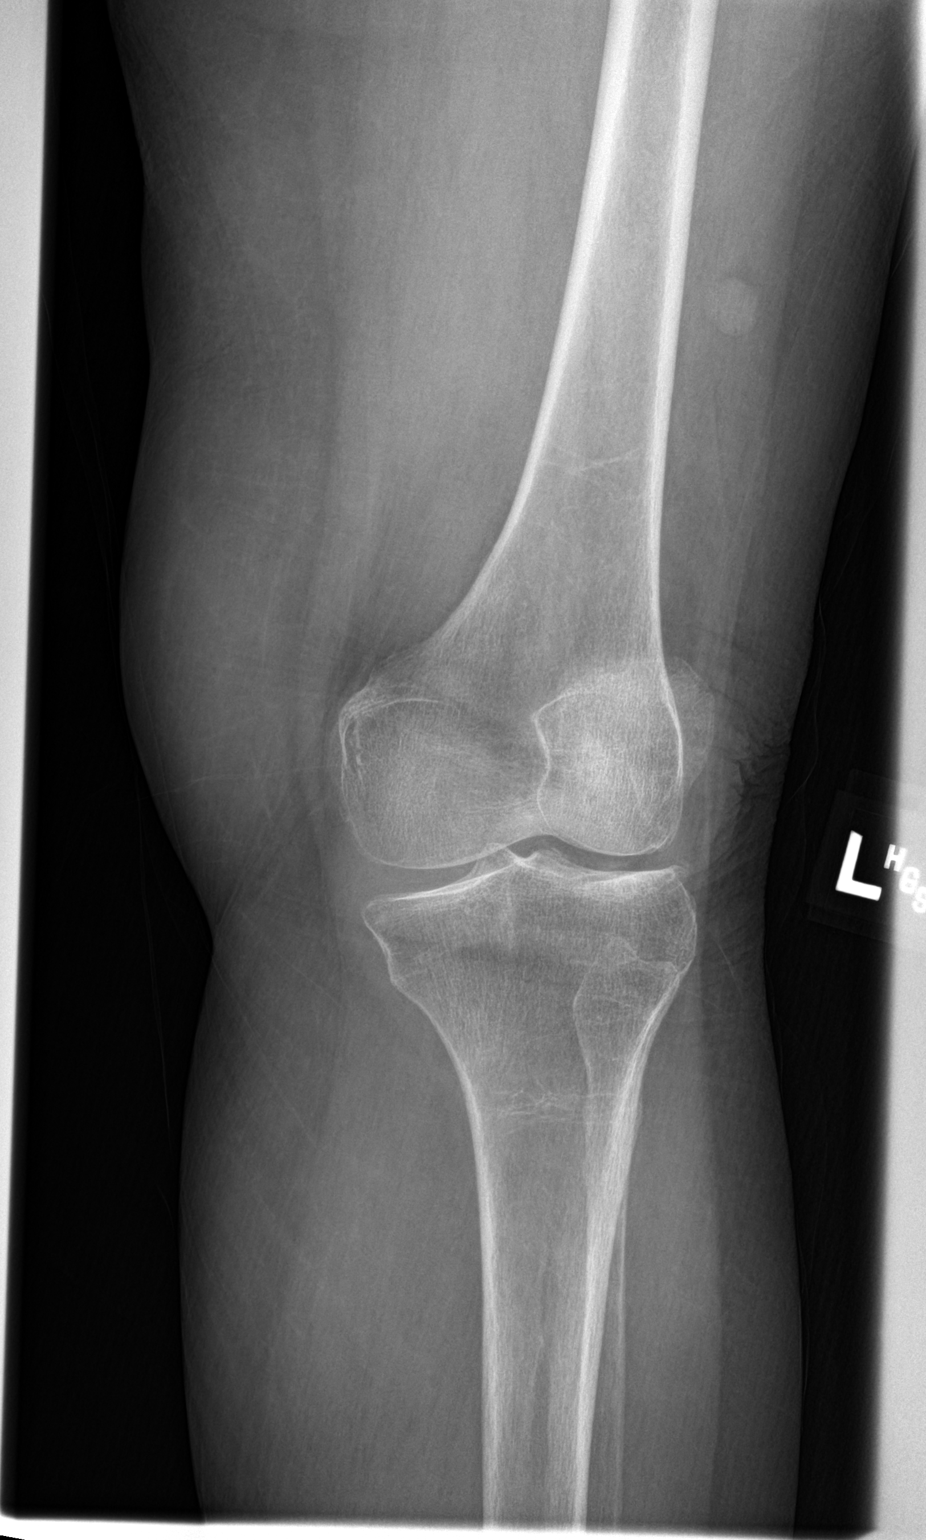
[im 4/4]
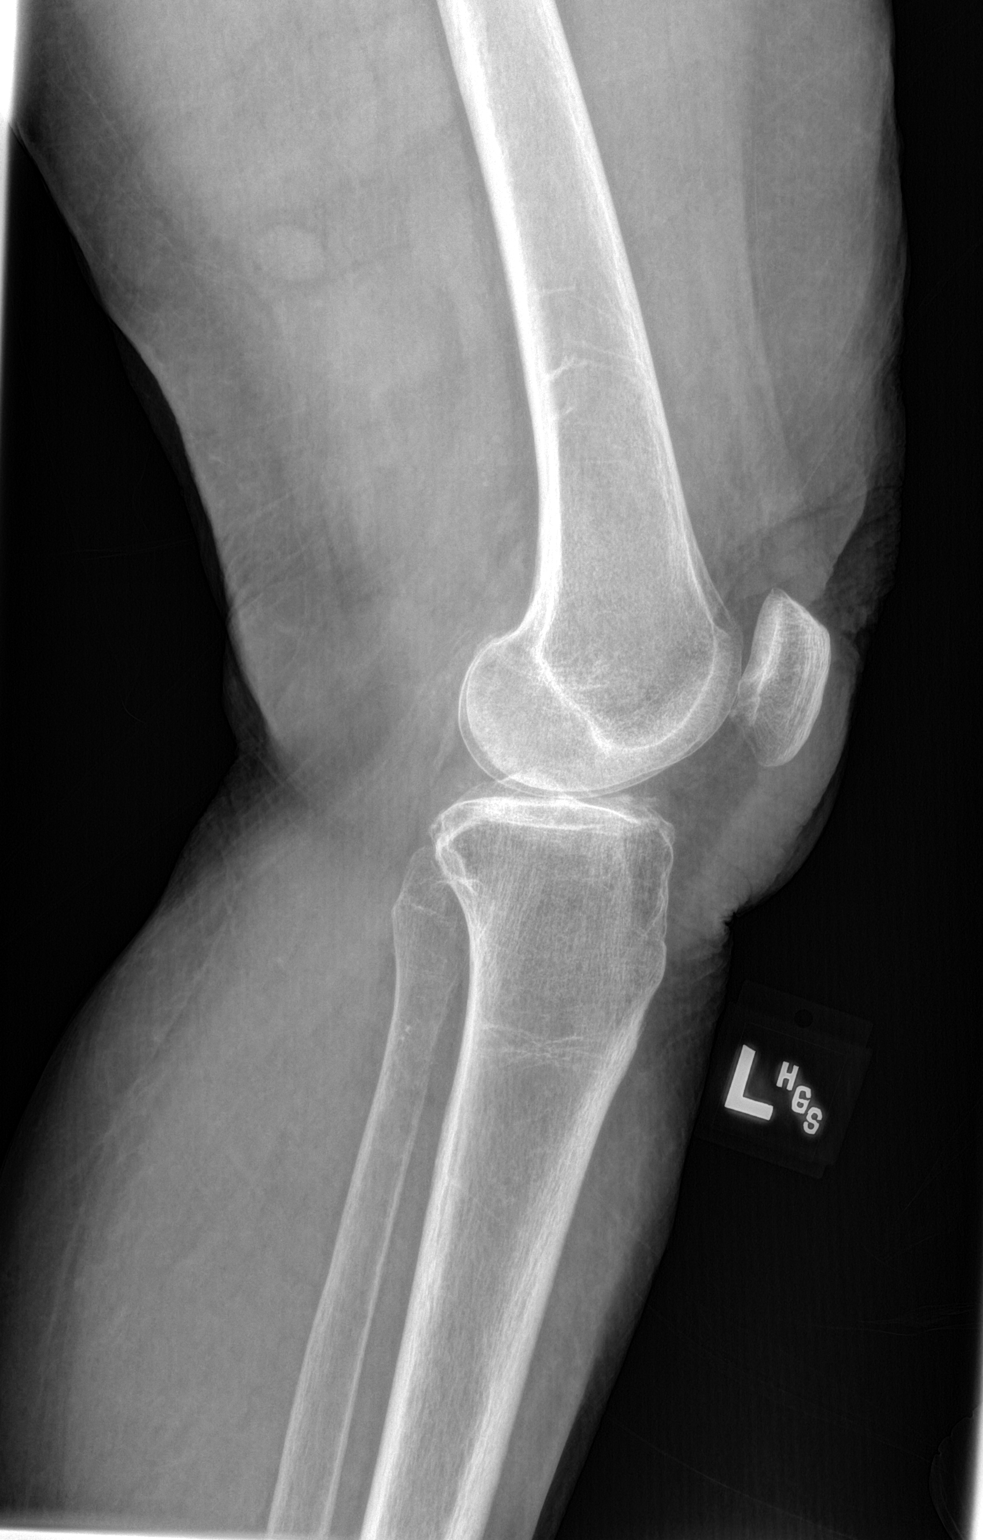

[4 of 4 positions shown; findings below may reference images not displayed]

FINDINGS: No evidence of acute fracture or joint effusion. No significant
degenerative changes. Normal bony mineralization. No lytic or
blastic osseous lesion. There is an approximately 1.3 cm soft tissue
density nodule overlying the lateral aspect of the mid to distal
left thigh. There is a faint curvilinear margin of air densities
suggesting that this nodule lies on the skin surface. This may
represent a mole or a seborrheic keratosis. Focal soft tissue
swelling involving the prepatellar soft tissues.
IMPRESSION: 1. No evidence of acute fracture, malalignment, knee joint effusion
or significant degenerative disease.
2. Prepatellar soft tissue swelling. This may represent prepatellar
bursitis.
3. Approximately 1.3 cm soft tissue nodule at the lateral aspect of
the mid to distal left thigh. This may represent a mole, seborrheic
keratosis or other soft tissue nodule.

## 2018-09-19 ENCOUNTER — Ambulatory Visit: Payer: Medicare Other | Admitting: Urology

## 2018-09-21 ENCOUNTER — Ambulatory Visit (INDEPENDENT_AMBULATORY_CARE_PROVIDER_SITE_OTHER): Payer: Medicare Other | Admitting: Nurse Practitioner

## 2018-09-21 ENCOUNTER — Other Ambulatory Visit: Payer: Self-pay

## 2018-09-21 VITALS — BP 121/78 | HR 99 | Temp 98.0°F | Ht 60.0 in | Wt 120.4 lb

## 2018-09-21 DIAGNOSIS — R300 Dysuria: Secondary | ICD-10-CM | POA: Diagnosis not present

## 2018-09-21 DIAGNOSIS — N952 Postmenopausal atrophic vaginitis: Secondary | ICD-10-CM | POA: Diagnosis not present

## 2018-09-21 DIAGNOSIS — J3481 Nasal mucositis (ulcerative): Secondary | ICD-10-CM

## 2018-09-21 LAB — POCT URINALYSIS DIPSTICK
Bilirubin, UA: NEGATIVE
Blood, UA: NEGATIVE
Glucose, UA: NEGATIVE
Ketones, UA: NEGATIVE
Leukocytes, UA: NEGATIVE
Nitrite, UA: NEGATIVE
Protein, UA: NEGATIVE
Spec Grav, UA: <=1.005 — AB (ref 1.010–1.025)
Urobilinogen, UA: 0.2 E.U./dL
pH, UA: 7.5 (ref 5.0–8.0)

## 2018-09-21 MED ORDER — GENTAMICIN SULFATE 0.1 % EX OINT: 1.0000 "application " | TOPICAL_OINTMENT | Freq: Three times a day (TID) | CUTANEOUS | 0 refills | Status: DC

## 2018-09-21 MED ORDER — ESTROGENS, CONJUGATED 0.625 MG/GM VA CREA: 1.0000 | TOPICAL_CREAM | Freq: Every day | VAGINAL | 12 refills | Status: DC

## 2018-09-21 NOTE — Patient Instructions (Addendum)
[name],   Thank you for coming in to clinic today.  1. START mucinex for your sinus congestion/pressure/pain  2. Call Dr. Pryor Ochoa to look for ulcer and cauterize this if needed.  3. For your vaginal burning, Start premarin cream 2 x daily and continue.  Use a fingertip amount at opening of vagina. - Change wet pads as soon as possible. - If she still feels like she has to pee all the time, call urology for another followup visit.  Please schedule a follow-up appointment with [name], AGNP. Return 5-7 days if symptoms worsen or fail to improve.  If you have any other questions or concerns, please feel free to call the clinic or send a message through [place]. You may also schedule an earlier appointment if necessary.  You will receive a survey after today's visit either digitally by e-mail or paper by [employer]. Your experiences and feedback matter to [place].  Please respond so we know how we are doing as we provide care for you.   Charlene Smiles, DNP, AGNP-BC Adult Gerontology Nurse Practitioner [place]

## 2018-09-21 NOTE — Progress Notes (Signed)
Subjective:    Patient ID: [name], female    DOB: [birth year], 84 y.o.   MRN: [id number]  [name] is a 84 y.o. female presenting on 09/21/2018 for Sinus Problem (headache, pt also complains of pressure in the head. History of sinus issue. Followed by ENT. Also complains of pain up in her nose. ) and Vaginal Atrophy (complains of vaginal burning, pt been taking abx since Monday for a possible bladder infection. She used a OTC dipstick that showed positive. )   HPI Sinus pressure Started Thursday with sinus pressure.  Has known sinus ulcers that are cauterized occasionally by Dr. Pryor Ochoa.  Patient notes both chills, constipation but are not new symptoms. - Patient is very poor historian about current illness, but continually repeats she has significant sinus pain.  Vaginal Burning Patient has taken cephalexin 500 twice daily for last 5 days for presumed UTI.  This helped head pressure a little, but returned worse yesterday.  Patient continues to also have vaginal burning despite treatment of UTI.  Again, patient states she feels miserable. - Pt denies fever, chills, sweats, nausea, vomiting, diarrhea and constipation.    Social History   Tobacco Use  . Smoking status: Never Smoker  . Smokeless tobacco: Never Used  Substance Use Topics  . Alcohol use: No  . Drug use: No    Review of Systems Per HPI unless specifically indicated above     Objective:    BP 121/78   Pulse 99   Temp 98 F (36.7 C) (Oral)   Ht 5' (1.524 m)   Wt 120 lb 6.4 oz (54.6 kg)   BMI 23.51 kg/m   Wt Readings from Last 3 Encounters:  09/21/18 120 lb 6.4 oz (54.6 kg)  09/04/18 122 lb 12.8 oz (55.7 kg)  08/24/18 138 lb 14.2 oz (63 kg)    Physical Exam Vitals signs reviewed. Exam conducted with a chaperone present.  Constitutional:      General: She is not in acute distress.    Appearance: She is well-developed.  HENT:     Head: Normocephalic and atraumatic.     Right Ear: Tympanic  membrane, ear canal and external ear normal. Decreased hearing (wearing hearing aid) noted.     Left Ear: Tympanic membrane, ear canal and external ear normal. Decreased hearing (wearing hearing aid) noted.     Nose: Nasal deformity (bilateral nasal mucosal lining thinning with evidence of prior bleeding) present.     Right Sinus: Frontal sinus tenderness present.     Mouth/Throat:     Lips: Pink.     Mouth: Mucous membranes are moist.     Pharynx: Oropharynx is clear.     Tonsils: No tonsillar exudate or tonsillar abscesses. Swelling: 0 on the right. 0 on the left.  Cardiovascular:     Rate and Rhythm: Normal rate and regular rhythm.     Heart sounds: Normal heart sounds, S1 normal and S2 normal.  Pulmonary:     Effort: Pulmonary effort is normal. No respiratory distress.     Breath sounds: Normal breath sounds.  Genitourinary:    Exam position: Supine.     Labia:        Right: Rash (erythematous, w/o discharge, atrophic) present.        Left: Rash (erythematous w/o discharge, atrophic) present.   Skin:    General: Skin is warm and dry.  Neurological:     Mental Status: She is alert and oriented to person, place,  and time.  Psychiatric:        Mood and Affect: Mood is anxious. Affect is labile.        Behavior: Behavior normal. Behavior is cooperative.        Cognition and Memory: Memory is impaired.        Judgment: Judgment is impulsive and inappropriate.      Results for orders placed or performed in visit on 09/21/18  POCT Urinalysis Dipstick  Result Value Ref Range   Color, UA light yellow    Clarity, UA clear    Glucose, UA Negative Negative   Bilirubin, UA negative    Ketones, UA negative    Spec Grav, UA <=1.005 (A) 1.010 - 1.025   Blood, UA negative    pH, UA 7.5 5.0 - 8.0   Protein, UA Negative Negative   Urobilinogen, UA 0.2 0.2 or 1.0 E.U./dL   Nitrite, UA negative    Leukocytes, UA Negative Negative   Appearance     Odor        Assessment & Plan:    Problem List Items Addressed This Visit    None    Visit Diagnoses    Dysuria    -  Primary Resolved UTI.  Now with atrophic vaginitis.  Cannot fully exclude urinary retention, recurrence.  Given patient's urinary frequency and sensation of incontinence without voiding, recommend patient follow-up with urology for pvr.   - follow-up here prn after urology.   Relevant Orders   POCT Urinalysis Dipstick (Completed)   Nasal mucositis (ulcerative)     Patient with likely nasal mucositis given presence of nasal turbinates near opening of nares.  Patient with history of ulcerative mucositis in past requiring ENT involvement.  No evidence of current sinus infection or URI.  Plan: 1. Repeat gentamycin ointment bid x 5 days. 2. Recommend follow-up with ENT. 3. Avoid nasal sprays other than nasal saline for moisturization 4. Follow-up here prn.   Relevant Medications   gentamicin ointment (GARAMYCIN) 0.1 %   Atrophic vaginitis     Patient with resolved UTI.  Now with prolonged atrophic vaginitis.  Cannot fully exclude moisture associated skin damage.    Start premarin cream for atrophic vaginitis.   Relevant Medications   conjugated estrogens (PREMARIN) vaginal cream      No orders of the defined types were placed in this encounter.   Follow up plan: No follow-ups on file.  Cassell Smiles, DNP, AGPCNP-BC Adult Gerontology Primary Care Nurse Practitioner [facility] 09/21/2018, 10:29 AM

## 2018-09-24 ENCOUNTER — Encounter: Payer: Self-pay | Admitting: Nurse Practitioner

## 2018-10-02 DIAGNOSIS — G501 Atypical facial pain: Secondary | ICD-10-CM | POA: Diagnosis not present

## 2018-10-02 DIAGNOSIS — R040 Epistaxis: Secondary | ICD-10-CM | POA: Diagnosis not present

## 2018-10-05 ENCOUNTER — Ambulatory Visit (INDEPENDENT_AMBULATORY_CARE_PROVIDER_SITE_OTHER): Payer: Medicare Other

## 2018-10-05 ENCOUNTER — Telehealth: Payer: Self-pay | Admitting: Urology

## 2018-10-05 VITALS — BP 119/83 | HR 103

## 2018-10-05 DIAGNOSIS — R32 Unspecified urinary incontinence: Secondary | ICD-10-CM

## 2018-10-05 LAB — URINALYSIS, COMPLETE
BILIRUBIN UA: NEGATIVE
GLUCOSE, UA: NEGATIVE
Ketones, UA: NEGATIVE
Leukocytes, UA: NEGATIVE
Nitrite, UA: NEGATIVE
PROTEIN UA: NEGATIVE
Specific Gravity, UA: 1.015 (ref 1.005–1.030)
Urobilinogen, Ur: 0.2 mg/dL (ref 0.2–1.0)
pH, UA: 7 (ref 5.0–7.5)

## 2018-10-05 LAB — BLADDER SCAN AMB NON-IMAGING

## 2018-10-05 NOTE — Progress Notes (Signed)
Patient present today complaining of urinary frequency, incontinence and lower abdominal pain. Urinalysis was clear. PVR 25 ml. Patient instructed to followup as scheduled and recommended to see PCP for abdominal pain.

## 2018-10-05 NOTE — Telephone Encounter (Signed)
Pt's daughter, [name], called office saying pt had a sharp pain in her left side and was urinating and couldn't stop.  She had to wear a diaper.  By the time pt arrived for nurse visit, daughter said she was unable to urinate and scared she was going into retention.  Pt was able to get a urine sample.

## 2018-10-06 ENCOUNTER — Other Ambulatory Visit: Payer: Self-pay

## 2018-10-06 DIAGNOSIS — F411 Generalized anxiety disorder: Secondary | ICD-10-CM

## 2018-10-06 DIAGNOSIS — I10 Essential (primary) hypertension: Secondary | ICD-10-CM

## 2018-10-06 MED ORDER — CARVEDILOL 3.125 MG PO TABS: 6.2500 mg | ORAL_TABLET | Freq: Two times a day (BID) | ORAL | 2 refills | Status: DC

## 2018-10-06 MED ORDER — MIRTAZAPINE 30 MG PO TABS: 30.0000 mg | ORAL_TABLET | Freq: Every day | ORAL | 2 refills | Status: DC

## 2018-10-06 NOTE — Telephone Encounter (Signed)
Fax request from [place] for patients medication:  Mirtazapine 30mg   #30 Take 1 tablet at bedtime

## 2018-10-06 NOTE — Telephone Encounter (Signed)
Also requesting:  Carvedilol 6.25mg   #60 Take 1 tablet bu mouth 2 times daily with meal.

## 2018-10-20 ENCOUNTER — Encounter: Payer: Self-pay | Admitting: Nurse Practitioner

## 2018-10-20 ENCOUNTER — Ambulatory Visit (INDEPENDENT_AMBULATORY_CARE_PROVIDER_SITE_OTHER): Payer: Medicare Other | Admitting: Nurse Practitioner

## 2018-10-20 ENCOUNTER — Other Ambulatory Visit: Payer: Self-pay

## 2018-10-20 VITALS — BP 138/87 | HR 108 | Temp 98.0°F | Ht 60.0 in | Wt 121.2 lb

## 2018-10-20 DIAGNOSIS — F411 Generalized anxiety disorder: Secondary | ICD-10-CM | POA: Diagnosis not present

## 2018-10-20 DIAGNOSIS — K648 Other hemorrhoids: Secondary | ICD-10-CM | POA: Diagnosis not present

## 2018-10-20 DIAGNOSIS — K5901 Slow transit constipation: Secondary | ICD-10-CM | POA: Diagnosis not present

## 2018-10-20 DIAGNOSIS — Z23 Encounter for immunization: Secondary | ICD-10-CM | POA: Diagnosis not present

## 2018-10-20 MED ORDER — LORAZEPAM 0.5 MG PO TABS: 0.5000 mg | ORAL_TABLET | Freq: Three times a day (TID) | ORAL | 2 refills | Status: DC | PRN

## 2018-10-20 NOTE — Patient Instructions (Addendum)
[name],   Thank you for coming in to clinic today.  1. START Miralax 1/2 - 1 dose once per day.  Take with 8 oz. water.  2. START colace 200 mg once per day. 3. Drink between 1/2 to 1 gallon of water daily  4. Goal is BM every 1-3 days with a soft formed log 5. Put your bowel movements on a calendar. 6. If you have no bowel movement in 3 days, may use dulcolax 1 pill once  STOP taking milk of magnesia  For your foot, call your podiatrist to schedule an appointment.  For hemorrhoids, use Preparation H externally.  Please schedule a follow-up appointment with Cassell Smiles, AGNP. Return in about 3 months (around 01/19/2019) for anxiety.  If you have any other questions or concerns, please feel free to call the clinic or send a message through [facility]. You may also schedule an earlier appointment if necessary.  You will receive a survey after today's visit either digitally by e-mail or paper by [employer]. Your experiences and feedback matter to [place].  Please respond so we know how we are doing as we provide care for you.   Cassell Smiles, DNP, AGNP-BC Adult Gerontology Nurse Practitioner [facility]

## 2018-10-20 NOTE — Progress Notes (Signed)
Subjective:    Patient ID: [name], female    DOB: 1924/10/30, 85 y.o.   MRN: [id number]  [name] is a 85 y.o. female presenting on 10/20/2018 for Constipation (pt currently only having liquid bowel movement. Pt currently drinking Prune juice and talking Milk Magnesia w/ no relief.   w/ hemorhoids  ) and Abdominal Pain (w/ bloating )  Patient is accompanied today by her daughter.   HPI Constipation with Abdominal pain/bloating Patient reports desire to have BM daily to prevent bloating that occurs on 2nd day of no BM.  Patient has been taking prune juice, milk of mag, and " anything I can get my hands on."  Patient states she has BM "rarely."   Patient states she now cannot go despite having tenesmus.  Instead of BM she"spews," and has pain with defecation.   - Hemorrhoids chronically, flared just after Christmas.  Straining too hard is most likely cause per pt.  Has had some mild blood in stool from hemorrhoids.  - After further prodding to describe BM consistency and frequency, patient has BM at least every 1-3 days.  "Spewing" is small, thin formed BM that becomes fluffy in toilet.  Patient only notes small pebbles occasionally.  Rarely has [place] stool 3-4 consistency. - Patient also notes last week had abdominal pain with something hot hurting her abdomen, then had incontinent episode of urine.  No repeated episodes.  Anxiety Patient continues feeling high state of anxiety.  Patient did not start taking buspirone regularly due to not understanding what the medication is for and not wanting to change medications.  Family has been unsuccessful at getting patient to understand this medication.  Continues taking Ativan 0.5 mg tid.  Regularly has shaking/tremor due to fear.  Social History   Tobacco Use  . Smoking status: Never Smoker  . Smokeless tobacco: Never Used  Substance Use Topics  . Alcohol use: No  . Drug use: No    Review of Systems Per HPI unless  specifically indicated above     Objective:    BP 138/87 (BP Location: Left Arm, Patient Position: Sitting, Cuff Size: Normal)   Pulse (!) 108   Temp 98 F (36.7 C) (Oral)   Ht 5' (1.524 m)   Wt 121 lb 3.2 oz (55 kg)   BMI 23.67 kg/m   Wt Readings from Last 3 Encounters:  10/20/18 121 lb 3.2 oz (55 kg)  09/21/18 120 lb 6.4 oz (54.6 kg)  09/04/18 122 lb 12.8 oz (55.7 kg)    Physical Exam Vitals signs reviewed.  Constitutional:      General: She is not in acute distress.    Appearance: She is well-developed.  HENT:     Head: Normocephalic and atraumatic.  Cardiovascular:     Rate and Rhythm: Normal rate and regular rhythm.     Pulses:          Radial pulses are 2+ on the right side and 2+ on the left side.       Posterior tibial pulses are 1+ on the right side and 1+ on the left side.     Heart sounds: Normal heart sounds, S1 normal and S2 normal.  Pulmonary:     Effort: Pulmonary effort is normal. No respiratory distress.     Breath sounds: Normal breath sounds and air entry.  Abdominal:     Tenderness: There is abdominal tenderness (mild tenderness with deep palpation) in the right lower quadrant and left  lower quadrant.  Genitourinary:    Rectum: External hemorrhoid (not currently inflamed) and internal hemorrhoid (prolapsed externally) present.  Musculoskeletal:     Right lower leg: No edema.     Left lower leg: No edema.  Skin:    General: Skin is warm and dry.     Capillary Refill: Capillary refill takes less than 2 seconds.  Neurological:     General: No focal deficit present.     Mental Status: She is alert and oriented to person, place, and time. Mental status is at baseline.  Psychiatric:        Attention and Perception: Attention normal.        Mood and Affect: Affect normal. Mood is anxious.        Behavior: Behavior normal. Behavior is cooperative.    Results for orders placed or performed in visit on 10/05/18  Urinalysis, Complete  Result Value Ref  Range   Specific Gravity, UA 1.015 1.005 - 1.030   pH, UA 7.0 5.0 - 7.5   Color, UA Yellow Yellow   Appearance Ur Clear Clear   Leukocytes, UA Negative Negative   Protein, UA Negative Negative/Trace   Glucose, UA Negative Negative   Ketones, UA Negative Negative   RBC, UA Trace (A) Negative   Bilirubin, UA Negative Negative   Urobilinogen, Ur 0.2 0.2 - 1.0 mg/dL   Nitrite, UA Negative Negative  BLADDER SCAN AMB NON-IMAGING  Result Value Ref Range   Scan Result 25 ml       Assessment & Plan:   Problem List Items Addressed This Visit      Other   Generalized anxiety disorder Chronic GAD with benzo dependence.  Patient is very reluctant to change to anxiety maintenance med.  Could not start buspirone between last visit.  [name] asks if we can increase Ativan as patient has lack of efficacy before 8 hours.  Patient does not want to transition to longer acting benzo.  Plan: 1. Continue to recommend maintenance anxiety med 2. Discussed tolerance with benzos.  Will not increase dose.  Continue at current doses for now.  Refills provided. 3. Follow-up 3 months and as scheduled.   Relevant Medications   LORazepam (ATIVAN) 0.5 MG tablet (Start on 11/01/2018)   Constipation - Primary Patient without true diagnosis of constipation per report.  Patient with perceived constipation has created likely bowel irritation and is contributing to worsening bloating.  Negative abdominal exam today.    Plan: 1. Discussed with patient that BM every 2-3 days remains normal.  She does not need BM daily.   2. Avoid milk of mag as this is irritating and likely leading to some bloating. 3. OTC meds: - START Miralax 1/2 - 1 dose once per day.  Take with 8 oz. water.  - START colace 200 mg once per day. - Drink between 1/2 to 1 gallon of water daily  - Goal is BM every 1-3 days with a soft formed log - Put your bowel movements on a calendar. - If you have no bowel movement in 3 days, may use  dulcolax 1 pill once 4. Follow-up in 3 months and prn.    Other Visit Diagnoses    Internal and external prolapsed hemorrhoids     Continue to control constipation as above only.  Continue preparation H topically.  Avoid suppositories since you have had some HR increase after use in the last week.   Continue to avoid topical steroids due to allergy.  Flu vaccine need     Pt > age 3.  Needs annual influenza vaccine.  Plan: 1. Administer high dose fluzone today.    Relevant Orders   Flu vaccine HIGH DOSE PF (Completed)      Meds ordered this encounter  Medications  . LORazepam (ATIVAN) 0.5 MG tablet    Sig: Take 1 tablet (0.5 mg total) by mouth 3 (three) times daily as needed for anxiety.    Dispense:  90 tablet    Refill:  2    Order Specific Question:   Supervising Provider    Answer:   Olin Hauser [[id number]]    Follow up plan: Return in about 3 months (around 01/19/2019) for anxiety.  Cassell Smiles, DNP, AGPCNP-BC Adult Gerontology Primary Care Nurse Practitioner [facility] 10/20/2018, 3:18 PM

## 2018-10-23 ENCOUNTER — Telehealth: Payer: Self-pay | Admitting: Nurse Practitioner

## 2018-10-23 ENCOUNTER — Encounter: Payer: Self-pay | Admitting: Nurse Practitioner

## 2018-10-23 DIAGNOSIS — J3481 Nasal mucositis (ulcerative): Secondary | ICD-10-CM

## 2018-10-23 DIAGNOSIS — F411 Generalized anxiety disorder: Secondary | ICD-10-CM

## 2018-10-23 MED ORDER — FLUOXETINE HCL 10 MG PO CAPS: 10.0000 mg | ORAL_CAPSULE | Freq: Every day | ORAL | 2 refills | Status: DC

## 2018-10-23 NOTE — Telephone Encounter (Signed)
Pt said she has been depressed lately and asked to have something sent to [facility].  She said she took prozac years ago.  Her call back number is [phone]

## 2018-10-23 NOTE — Telephone Encounter (Signed)
Pt's daughter, [name] thought an antibiotic cream was for pt's foot.  Please call [phone]

## 2018-10-23 NOTE — Telephone Encounter (Signed)
The buspirone prescribed for patient at prior visit is designed to help treat depression and anxiety.  If patient prefers Prozac, we can start with that instead.    Patient notes she had this several years ago and states she took Prozac.   Stop buspirone.  Patient states it caused nausea. START prozac 10 mg once daily per patient preference and willingness to take anti-anxiety med.  Patient called and verbalizes understanding for current plan.

## 2018-10-23 NOTE — Telephone Encounter (Signed)
I attempted to contact the pt daughter [name], no answer. The pt phone states the wireless customer you are trying to call is not available to try my call again later.

## 2018-10-23 NOTE — Telephone Encounter (Signed)
Incoming call

## 2018-10-24 MED ORDER — MUPIROCIN 2 % EX OINT: 1.0000 "application " | TOPICAL_OINTMENT | Freq: Two times a day (BID) | CUTANEOUS | 0 refills | Status: AC

## 2018-10-24 NOTE — Telephone Encounter (Signed)
Patient had medication on list previously, so it was not sent.  I have reordered this and communicated back to [name] about using mupirocin twice daily on toe wound.

## 2018-10-24 NOTE — Addendum Note (Signed)
Addended by: Cassell Smiles R on: 10/24/2018 11:52 AM   Modules accepted: Orders

## 2018-10-24 NOTE — Telephone Encounter (Signed)
The pt daughter called with concerns. She states that at her mothers last visit she complained about a spot on her foot that looked infected. The patient been treating it with neosporin. Susan(daughter) states that you told them you would call in a cream to the pharmacy that you thought would better treat the area.

## 2018-10-27 ENCOUNTER — Ambulatory Visit: Payer: Medicare Other | Admitting: Nurse Practitioner

## 2018-11-01 ENCOUNTER — Other Ambulatory Visit: Payer: Self-pay

## 2018-11-01 ENCOUNTER — Other Ambulatory Visit: Payer: Self-pay | Admitting: Internal Medicine

## 2018-11-01 DIAGNOSIS — M25571 Pain in right ankle and joints of right foot: Secondary | ICD-10-CM | POA: Diagnosis not present

## 2018-11-01 DIAGNOSIS — S99911A Unspecified injury of right ankle, initial encounter: Secondary | ICD-10-CM | POA: Diagnosis not present

## 2018-11-01 DIAGNOSIS — I10 Essential (primary) hypertension: Secondary | ICD-10-CM

## 2018-11-02 ENCOUNTER — Other Ambulatory Visit: Payer: Self-pay

## 2018-11-02 MED ORDER — AMLODIPINE BESYLATE 5 MG PO TABS: 5.0000 mg | ORAL_TABLET | Freq: Every day | ORAL | 2 refills | Status: DC

## 2018-11-03 ENCOUNTER — Ambulatory Visit: Payer: Medicare Other | Admitting: Nurse Practitioner

## 2018-11-03 ENCOUNTER — Ambulatory Visit: Payer: Medicare Other | Admitting: Urology

## 2018-11-08 ENCOUNTER — Other Ambulatory Visit: Payer: Self-pay | Admitting: Internal Medicine

## 2018-11-08 DIAGNOSIS — L602 Onychogryphosis: Secondary | ICD-10-CM | POA: Diagnosis not present

## 2018-11-08 DIAGNOSIS — M79675 Pain in left toe(s): Secondary | ICD-10-CM | POA: Diagnosis not present

## 2018-11-08 DIAGNOSIS — M79671 Pain in right foot: Secondary | ICD-10-CM | POA: Diagnosis not present

## 2018-11-08 DIAGNOSIS — M79674 Pain in right toe(s): Secondary | ICD-10-CM | POA: Diagnosis not present

## 2018-11-08 DIAGNOSIS — L851 Acquired keratosis [keratoderma] palmaris et plantaris: Secondary | ICD-10-CM | POA: Diagnosis not present

## 2018-11-11 ENCOUNTER — Other Ambulatory Visit: Payer: Self-pay

## 2018-11-11 ENCOUNTER — Emergency Department: Payer: Medicare Other

## 2018-11-11 ENCOUNTER — Encounter: Payer: Self-pay | Admitting: Emergency Medicine

## 2018-11-11 ENCOUNTER — Emergency Department
Admission: EM | Admit: 2018-11-11 | Discharge: 2018-11-11 | Disposition: A | Discharge status: Home / Self Care | Payer: Medicare Other | Attending: Emergency Medicine | Admitting: Emergency Medicine

## 2018-11-11 DIAGNOSIS — R109 Unspecified abdominal pain: Secondary | ICD-10-CM | POA: Insufficient documentation

## 2018-11-11 DIAGNOSIS — K649 Unspecified hemorrhoids: Secondary | ICD-10-CM | POA: Diagnosis not present

## 2018-11-11 DIAGNOSIS — Z79899 Other long term (current) drug therapy: Secondary | ICD-10-CM | POA: Insufficient documentation

## 2018-11-11 DIAGNOSIS — I10 Essential (primary) hypertension: Secondary | ICD-10-CM | POA: Insufficient documentation

## 2018-11-11 DIAGNOSIS — K5900 Constipation, unspecified: Secondary | ICD-10-CM

## 2018-11-11 DIAGNOSIS — K6289 Other specified diseases of anus and rectum: Secondary | ICD-10-CM | POA: Insufficient documentation

## 2018-11-11 LAB — CBC
HCT: 38.2 % (ref 36.0–46.0)
Hemoglobin: 12.4 g/dL (ref 12.0–15.0)
MCH: 27.1 pg (ref 26.0–34.0)
MCHC: 32.5 g/dL (ref 30.0–36.0)
MCV: 83.6 fL (ref 80.0–100.0)
NRBC: 0 % (ref 0.0–0.2)
Platelets: 215 10*3/uL (ref 150–400)
RBC: 4.57 MIL/uL (ref 3.87–5.11)
RDW: 14.3 % (ref 11.5–15.5)
WBC: 7.5 10*3/uL (ref 4.0–10.5)

## 2018-11-11 LAB — COMPREHENSIVE METABOLIC PANEL
ALT: 15 U/L (ref 0–44)
ANION GAP: 6 (ref 5–15)
AST: 24 U/L (ref 15–41)
Albumin: 4.4 g/dL (ref 3.5–5.0)
Alkaline Phosphatase: 89 U/L (ref 38–126)
BUN: 9 mg/dL (ref 8–23)
CO2: 26 mmol/L (ref 22–32)
Calcium: 9.8 mg/dL (ref 8.9–10.3)
Chloride: 103 mmol/L (ref 98–111)
Creatinine, Ser: 0.49 mg/dL (ref 0.44–1.00)
GFR calc Af Amer: >60 mL/min (ref >60)
GFR calc non Af Amer: >60 mL/min (ref >60)
Glucose, Bld: 101 mg/dL — ABNORMAL HIGH (ref 70–99)
POTASSIUM: 3.7 mmol/L (ref 3.5–5.1)
Sodium: 135 mmol/L (ref 135–145)
Total Bilirubin: 0.9 mg/dL (ref 0.3–1.2)
Total Protein: 7.3 g/dL (ref 6.5–8.1)

## 2018-11-11 LAB — URINALYSIS, COMPLETE (UACMP) WITH MICROSCOPIC
Bilirubin Urine: NEGATIVE
Glucose, UA: NEGATIVE mg/dL
Ketones, ur: NEGATIVE mg/dL
LEUKOCYTES UA: NEGATIVE
Nitrite: NEGATIVE
Protein, ur: NEGATIVE mg/dL
SPECIFIC GRAVITY, URINE: 1.003 — AB (ref 1.005–1.030)
pH: 9 — ABNORMAL HIGH (ref 5.0–8.0)

## 2018-11-11 LAB — LIPASE, BLOOD: Lipase: 32 U/L (ref 11–51)

## 2018-11-11 MED ORDER — PHENAZOPYRIDINE HCL 100 MG PO TABS
95.0000 mg | ORAL_TABLET | Freq: Once | ORAL | Status: AC
  Administered 2018-11-11: 100 mg via ORAL
  Filled 2018-11-11: qty 1

## 2018-11-11 MED ORDER — PHENAZOPYRIDINE HCL 100 MG PO TABS: 100.0000 mg | ORAL_TABLET | Freq: Three times a day (TID) | ORAL | 0 refills | Status: DC | PRN

## 2018-11-11 MED ORDER — DICYCLOMINE HCL 10 MG PO CAPS
20.0000 mg | ORAL_CAPSULE | Freq: Once | ORAL | Status: AC
  Administered 2018-11-11: 20 mg via ORAL

## 2018-11-11 MED ORDER — HYDROCODONE-ACETAMINOPHEN 5-325 MG PO TABS
1.0000 | ORAL_TABLET | Freq: Once | ORAL | Status: AC
  Administered 2018-11-11: 1 via ORAL
  Filled 2018-11-11: qty 1

## 2018-11-11 NOTE — ED Notes (Addendum)
Pt rectal exam by EDP, no stool found, pt cleaned of urine and placed onto Chux and diaper, POC updated with family  Pt now c/o of vagina hurting still "worst pain ever", unable to describe quality of pain

## 2018-11-11 NOTE — ED Notes (Signed)
Family at bedside. 

## 2018-11-11 NOTE — ED Notes (Signed)
Pt on bedpan for 5 minutes and reports "unable to urinate", pt did make scant amount of urine

## 2018-11-11 NOTE — ED Triage Notes (Signed)
PT to ER via EMS form home with c/o constipation and hemorrhoids.  Pt has chronic pain and takes hydrocodone 4x per day.  Pt has history of constipation.

## 2018-11-11 NOTE — ED Provider Notes (Signed)
[facility] Emergency Department Provider Note  Time seen: 7:45 PM  I have reviewed the triage vital signs and the nursing notes.   HISTORY  Chief Complaint Constipation and Hemorrhoids    HPI [name] is a 85 y.o. female with a past medical history of chronic back pain, anxiety, hypertension, presents to the emergency department for constipation and abdominal and rectal pain.  According to the patient she does not know when her last bowel movement is.  States she has been experiencing pain in her abdomen and rectum.  States she has hemorrhoids inside of her and feels like it is making her constipated.  Patient is in discomfort, saying she cannot wait for an x-ray she needs someone to fix her constipation.   Past Medical History:  Diagnosis Date  . Allergy    environmental  . Anxiety   . Chronic back pain   . High cholesterol   . History of urinary retention   . Hypertension   . Migraines   . Osteoporosis   . Pneumonia   . Sinusitis     Patient Active Problem List   Diagnosis Date Noted  . Macular degeneration 09/04/2018  . Syncope 08/23/2018  . Pressure injury of skin 07/29/2018  . Cervical lymphadenopathy 07/18/2018  . Pain in right hip 07/18/2018  . Acute recurrent sinusitis 06/25/2018  . Lymphadenopathy 06/25/2018  . Stomatitis and mucositis 06/25/2018  . Chest pain 02/16/2018  . Shortness of breath 02/16/2018  . Other fatigue 02/16/2018  . Arm fracture, left 01/03/2018  . Arthritis 01/03/2018  . Compression fracture of thoracic vertebra ([place]) 01/03/2018  . Emphysema lung ([place]) 01/03/2018  . GERD (gastroesophageal reflux disease) 01/03/2018  . GI bleeding 01/03/2018  . Lumbar spondylosis 01/03/2018  . Menstrual problem 01/03/2018  . Osteoarthritis of knee 01/03/2018  . Osteoporosis 01/03/2018  . Acute pain of right knee 01/03/2018  . Ankle sprain 01/03/2018  . Synovial cyst of popliteal space 01/03/2018  . History of urinary  retention 11/10/2017  . Generalized anxiety disorder 10/24/2017  . Constipation 10/24/2017  . Internal hemorrhoids 10/24/2017  . Essential (primary) hypertension 10/24/2017  . Thrombocytopenia ([place]) 10/24/2017  . Acute encephalopathy 08/04/2017  . Hyponatremia 12/15/2015    Past Surgical History:  Procedure Laterality Date  . ABDOMINAL HYSTERECTOMY    . APPENDECTOMY    . BACK SURGERY    . BACK SURGERY  2008   2009  . BREAST BIOPSY Right 2002   neg  . HEMORRHOID SURGERY    . MOUTH SURGERY  2019  . TONSILLECTOMY      Prior to Admission medications   Medication Sig Start Date End Date Taking? Authorizing Provider  amLODipine (NORVASC) 5 MG tablet Take 1 tablet (5 mg total) by mouth daily. 11/02/18   Mikey College, NP  carvedilol (COREG) 3.125 MG tablet Take 2 tablets (6.25 mg total) by mouth 2 (two) times daily with a meal. 10/06/18   Mikey College, NP  FLUoxetine (PROZAC) 10 MG capsule Take 1 capsule (10 mg total) by mouth daily. 10/23/18   Mikey College, NP  furosemide (LASIX) 20 MG tablet Take 1 tablet po QD prn Patient taking differently: Take 20 mg by mouth daily as needed. Take 1 tablet po QD prn 04/28/18   Lavera Guise, MD  HYDROcodone-acetaminophen [facility]) 10-325 MG tablet Take 1 tablet by mouth every 4 (four) hours as needed for moderate pain.     [provider]  LORazepam (ATIVAN) 0.5  MG tablet Take 1 tablet (0.5 mg total) by mouth 3 (three) times daily as needed for anxiety. 11/01/18   Mikey College, NP  mirtazapine (REMERON) 30 MG tablet Take 1 tablet (30 mg total) by mouth at bedtime. 10/06/18   Mikey College, NP  tamsulosin (FLOMAX) 0.4 MG CAPS capsule Take 1 capsule (0.4 mg total) by mouth daily. 04/21/18   Zara Council A, PA-C    Allergies  Allergen Reactions  . Ceftin [Cefuroxime]   . Cortisone Other (See Comments)    Headache   . Fluticasone Swelling    Facial  . Levofloxacin Nausea And Vomiting  .  Meperidine Other (See Comments)  . Morphine And Related Other (See Comments)    headache  . Morphine Sulfate   . Nisoldipine   . Penicillin G Potassium In D5w   . Prednisone Other (See Comments)    Headache   . Sulfa Antibiotics Nausea Only    Family History  Problem Relation Age of Onset  . Breast cancer Sister 20  . Breast cancer Maternal Grandmother 80  . Breast cancer Sister 12  . Bladder Cancer Brother   . Hypertension Neg Hx   . Kidney cancer Neg Hx     Social History Social History   Tobacco Use  . Smoking status: Never Smoker  . Smokeless tobacco: Never Used  Substance Use Topics  . Alcohol use: No  . Drug use: No    Review of Systems Constitutional: Negative for fever. Cardiovascular: Negative for chest pain. Respiratory: Negative for shortness of breath. Gastrointestinal: Positive for abdominal pain and rectal pain.  Positive constipation.  Negative for vomiting.  Cannot tell me when her last bowel movement was. Genitourinary: Negative for urinary compaints Musculoskeletal: Negative for musculoskeletal complaints Neurological: Negative for headache All other ROS negative  ____________________________________________   PHYSICAL EXAM:  VITAL SIGNS: ED Triage Vitals  Enc Vitals Group     BP 11/11/18 1855 (!) 147/87     Pulse Rate 11/11/18 1855 82     Resp 11/11/18 1855 18     Temp 11/11/18 1855 97.9 F (36.6 C)     Temp src --      SpO2 11/11/18 1855 96 %     Weight 11/11/18 1856 117 lb (53.1 kg)     Height 11/11/18 1856 5' (1.524 m)     Head Circumference --      Peak Flow --      Pain Score 11/11/18 1856 10     Pain Loc --      Pain Edu? --      Excl. in [place]? --    Constitutional: Alert and oriented. Well appearing and in no distress. Eyes: Normal exam ENT   Head: Normocephalic and atraumatic.   Mouth/Throat: Mucous membranes are moist. Cardiovascular: Normal rate, regular rhythm. Respiratory: Normal respiratory effort without  tachypnea nor retractions. Breath sounds are clear  Gastrointestinal: Soft, mild diffuse tenderness mild distention. Musculoskeletal: Nontender with normal range of motion in all extremities.  Neurologic:  Normal speech and language. No gross focal neurologic deficits  Skin:  Skin is warm, dry and intact.  Psychiatric: Mood and affect are normal.   ____________________________________________   RADIOLOGY  X-ray shows no acute findings.  No signs of obstruction or free air.  No significant colonic burden.  ____________________________________________   INITIAL IMPRESSION / ASSESSMENT AND PLAN / ED COURSE  Pertinent labs & imaging results that were available during my care of the patient were reviewed  by me and considered in my medical decision making (see chart for details).  Patient presents to the emergency department for constipation and abdominal and rectal pain.  States she is tried everything at home including MiraLAX to try to have a bowel movement but has been unable to do so.  Patient does have mild abdominal tenderness on my exam with mild distention.  We will obtain a two-view abdominal x-ray to help rule out obstruction.  If x-ray is negative we will attempt disimpaction and possible enema for relief.  X-rays negative.  On rectal examination patient has mild hemorrhoids, no thrombosed hemorrhoids or significantly large hemorrhoids.  Rectal examination is largely nontender, no stool in the rectal vault.  No fecal impaction.  Daughter is now here with the patient who states that the patient is obsessed with constipation and bowel movements.  Daughter states the patient think she needs to have a bowel movement every day and when this does not happen she becomes very worried about it.  States she had a brother passed away from a bowel obstruction and the patient is convinced that 1 day she will get a bowel obstruction.  No nausea or vomiting.  Daughter states she is not sure when her  last bowel movement was but states she knows it was not 7 days ago.  Patient's main concern has been and continues to be where is the stool, if it is not coming out she thinks that is backed up inside of her.  Given the patient's concerns we will check blood work and a urine sample as well.  X-ray does not show any signs of bowel obstruction.  Patient's blood work is reassuring.  Normal white blood cell count.  Patient's urinalysis appears normal as well as urine culture has been added on as a precaution.  Patient appears very well.  We will discharge the patient home with family.  Patient and family agreeable to plan of care.  ____________________________________________   FINAL CLINICAL IMPRESSION(S) / ED DIAGNOSES  Constipation   Harvest Dark, MD 11/11/18 2158

## 2018-11-11 NOTE — ED Notes (Addendum)
Pt reports hemorrhoids are "up in me and have to come out! They're this big!", reports taking reports last vicodin taken at 1800, pt largely inconsolable: unable to orient to wait for EDP or this RNs inability to locate pt's daughter - not in lobby  Pt wailing and asking RN for help, pt unable to report what helps, pt is rather poor hx, reports use of "hydrocodone ten every four hours for back pain", pt unable to orient to POC for comfort, repeatedly asks the same questions  Pt reports taking miralax and prune juice and hasn't had a bowel movement in over 1 week

## 2018-11-11 NOTE — ED Notes (Addendum)
Discharge instructions reviewed with patient. Questions fielded by this RN. Patient verbalizes understanding of instructions. Patient discharged home in stable condition per paduchowski. No acute distress noted at time of discharge.   While attempting to discharge pt, pt requests diaper change, delay to discharged d/t pt asking this RN to "wait for me to finish" and "I'm still going"  Diaper changed, blue pants on pt, and pt c/o being cold has covered her upper body, chux given to family member as requested to protect their vehicle

## 2018-11-12 DIAGNOSIS — I10 Essential (primary) hypertension: Secondary | ICD-10-CM | POA: Diagnosis not present

## 2018-11-12 DIAGNOSIS — R52 Pain, unspecified: Secondary | ICD-10-CM | POA: Diagnosis not present

## 2018-11-14 LAB — URINE CULTURE: Culture: 20000 — AB

## 2018-11-15 DIAGNOSIS — J329 Chronic sinusitis, unspecified: Secondary | ICD-10-CM | POA: Diagnosis not present

## 2018-11-15 DIAGNOSIS — M47816 Spondylosis without myelopathy or radiculopathy, lumbar region: Secondary | ICD-10-CM | POA: Diagnosis not present

## 2018-11-15 DIAGNOSIS — Z6823 Body mass index (BMI) 23.0-23.9, adult: Secondary | ICD-10-CM | POA: Diagnosis not present

## 2018-11-29 ENCOUNTER — Telehealth: Payer: Self-pay | Admitting: Nurse Practitioner

## 2018-11-29 NOTE — Telephone Encounter (Signed)
Pt can not get transportation to urgent care for swollen, hot legs.  Please call [phone]

## 2018-11-29 NOTE — Telephone Encounter (Signed)
The pt called with bilateral leg swelling with warmth feeling on the left lower leg x 6 days. I spoke w/ the patient and her daughter and the daughter stated that the patient been having intermittent swelling. The swelling normally improves when she elevates the legs at bedtime. Pt instructed if symptoms worsen she need to seek immediate care.  Appt scheduled for Thursday at 11:20am

## 2018-11-29 NOTE — Telephone Encounter (Signed)
Will address at appointment tomorrow.

## 2018-11-30 ENCOUNTER — Ambulatory Visit (INDEPENDENT_AMBULATORY_CARE_PROVIDER_SITE_OTHER): Payer: Medicare Other | Admitting: Nurse Practitioner

## 2018-11-30 ENCOUNTER — Other Ambulatory Visit: Payer: Self-pay

## 2018-11-30 ENCOUNTER — Encounter: Payer: Self-pay | Admitting: Nurse Practitioner

## 2018-11-30 VITALS — BP 125/70 | HR 85 | Temp 98.2°F | Ht 60.0 in | Wt 122.6 lb

## 2018-11-30 DIAGNOSIS — I872 Venous insufficiency (chronic) (peripheral): Secondary | ICD-10-CM | POA: Diagnosis not present

## 2018-11-30 DIAGNOSIS — K591 Functional diarrhea: Secondary | ICD-10-CM | POA: Diagnosis not present

## 2018-11-30 NOTE — Progress Notes (Signed)
Subjective:    Patient ID: [name], female    DOB: 1925-01-21, 85 y.o.   MRN: [id number]  [name] is a 85 y.o. female presenting on 11/30/2018 for Leg Pain (intermittent bilateral leg swelling, that improves when they are elevated. swollen, redness and warmth to the touch. x 1 week ) and Diarrhea (4-5 days ) Daughter [name] is with patient today.  HPI Leg Pain Patient has intermittent bilateral leg swelling, redness and warm to touch for the last 1 week.  Swelling resolves with elevation and overnight. Patient has had history of PVD and has worn compression socks regularly in past.  Currently cannot get them on and has not been wearing them recently.  Diarrhea Patient is now having diarrhea for last 4-5 days.  She previously had constipation and changed her bowel regimen to no medications in the last week.  Patient states she took immodium today.  Social History   Tobacco Use  . Smoking status: Never Smoker  . Smokeless tobacco: Never Used  Substance Use Topics  . Alcohol use: No  . Drug use: No    Review of Systems Per HPI unless specifically indicated above     Objective:    BP 125/70 (BP Location: Right Arm, Patient Position: Sitting, Cuff Size: Normal)   Pulse 85   Temp 98.2 F (36.8 C) (Oral)   Ht 5' (1.524 m)   Wt 122 lb 9.6 oz (55.6 kg)   BMI 23.94 kg/m   Wt Readings from Last 3 Encounters:  11/30/18 122 lb 9.6 oz (55.6 kg)  11/11/18 117 lb (53.1 kg)  10/20/18 121 lb 3.2 oz (55 kg)    Physical Exam Vitals signs reviewed.  Constitutional:      General: She is not in acute distress.    Appearance: She is well-developed.  HENT:     Head: Normocephalic and atraumatic.  Cardiovascular:     Rate and Rhythm: Normal rate and regular rhythm.     Pulses:          Radial pulses are 2+ on the right side and 2+ on the left side.       Posterior tibial pulses are 1+ on the right side and 1+ on the left side.     Heart sounds: Normal heart sounds,  S1 normal and S2 normal.  Pulmonary:     Effort: Pulmonary effort is normal. No respiratory distress.     Breath sounds: Normal breath sounds and air entry.  Musculoskeletal:     Right lower leg: 3+ Edema present.     Left lower leg: 3+ Edema present.  Skin:    General: Skin is warm and dry.     Capillary Refill: Capillary refill takes less than 2 seconds.     Comments: Red, patchy rash consistent with stasis dermatitis.  Mildly increased warmth of skin, but no apparent cellulitis or DVT.  Present bilaterally with R>L   Neurological:     General: No focal deficit present.     Mental Status: She is alert and oriented to person, place, and time. Mental status is at baseline.  Psychiatric:        Attention and Perception: Attention normal.        Mood and Affect: Mood and affect normal.        Behavior: Behavior normal. Behavior is cooperative.        Thought Content: Thought content normal.        Judgment: Judgment normal.  Assessment & Plan:   Problem List Items Addressed This Visit    None    Visit Diagnoses    Venous stasis dermatitis of both lower extremities    -  Primary Patient with known PVD, recently worsened now with stasis dermatitis.  BLE edema +3 pitting.   - Patient with past history of weeping from edema with cellulitis.  Plan: 1. Start with moisturizer for legs to address dermatitis. 2. Purchase 8-24mmHg compression socks that fit currently and start wearing daily.  Take off at night 3. Keep legs elevated during day. 4. Follow-up prn.  Call clinic if legs start weeping as in past.     Functional diarrhea     Patient with constipation and now followed with diarrhea. Likely due to overmedication for constipation.  Continue high fiber diet.  Avoid all bowel regimen medications at this time.  Reinforced normal BM is every 1-3 days.  Follow-up prn.       Follow up plan: Return if symptoms worsen or fail to improve.  Cassell Smiles, DNP, AGPCNP-BC Adult  Gerontology Primary Care Nurse Practitioner [facility] 11/30/2018, 11:30 AM

## 2018-11-30 NOTE — Patient Instructions (Addendum)
[name],   Thank you for coming in to clinic today.  1. You have venous stasis dermatitis.  This is the cause of your red skin.  If there is any worsening of redness, opening of skin or weeping, call clinic for antibiotics.  None are needed now (not currently cellulitis).  Get a larger pair of compression socks so you can start wearing them again.  - Keep legs elevated during the day in a chair.  Keep them on pillows.  [facility] [place], [place] [address]  [phone]  [place],  [phone]   2. For diarrhea,  - STOP all meds for constipation.  Bowel movements are still formed, so there is no need to take any medication.  STOP immodium as well. - Eat a regular diet high in fiber  Please schedule a follow-up appointment with Cassell Smiles, AGNP. Return if symptoms worsen or fail to improve.  If you have any other questions or concerns, please feel free to call the clinic or send a message through [place]. You may also schedule an earlier appointment if necessary.  You will receive a survey after today's visit either digitally by e-mail or paper by [employer]. Your experiences and feedback matter to [place].  Please respond so we know how we are doing as we provide care for you.   Cassell Smiles, DNP, AGNP-BC Adult Gerontology Nurse Practitioner [place]

## 2018-12-06 ENCOUNTER — Encounter: Payer: Self-pay | Admitting: Nurse Practitioner

## 2018-12-07 ENCOUNTER — Encounter: Payer: Self-pay | Admitting: Nurse Practitioner

## 2018-12-14 ENCOUNTER — Telehealth: Payer: Self-pay | Admitting: Nurse Practitioner

## 2018-12-14 NOTE — Telephone Encounter (Signed)
Discussed with [name].  Initial concern with RIGHT arm and leg numbness was for possible CVA.  Patient was not having any other CVA symptoms, so it was decided to defer to OV tomorrow.  Patient had review of emergency signs and symptoms and was recommended to travel to ER if occurring.

## 2018-12-14 NOTE — Telephone Encounter (Signed)
I called pt she's complaining of bilateral leg swelling x 2-3 weeks with pain in the back of her Rt knee that radiates down her calf. 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 She also complains of numbness in her Rt arm., but no change in her ability to use the arm or grip objects. I scheduled the patient an appt for tomorrow at 10:20am, but told her if the symptoms worsen she need to go to the ER. She verbalize understanding.

## 2018-12-14 NOTE — Telephone Encounter (Signed)
Pt's right leg is really swollen and numb.  Right hand and arm are also numb [phone]

## 2018-12-15 ENCOUNTER — Telehealth: Payer: Self-pay

## 2018-12-15 ENCOUNTER — Ambulatory Visit: Payer: Medicare Other | Admitting: Nurse Practitioner

## 2018-12-15 NOTE — Telephone Encounter (Signed)
The pt called to cancel her appt for today, because her daughter is unable to bring her. The pt called back to report that she figured out what was causing her arm pain. She was gripping her phone and walker in the same hand when she moved around. She also contributes it to arthritis. She states that she will elevate her legs and wear compress stocking. If symptoms doesn't improve she will call back and make an appt for next week.

## 2018-12-15 NOTE — Telephone Encounter (Signed)
Great.  No need to see patient.  I suspect patient has a hard time judging severity of symptoms as we have seen in OV in past.

## 2018-12-20 ENCOUNTER — Telehealth: Payer: Self-pay

## 2018-12-20 NOTE — Telephone Encounter (Signed)
The pt call complaining of bilateral ear pain, headache, nasal congestion and blood scabies in the nose x 2days. She also complains of Left knee swelling x 1 day. The pt states she cannot come in the office today, because both of her daughters are working. Shes requesting that you call in something for her symptoms. Please advise

## 2018-12-20 NOTE — Telephone Encounter (Signed)
Patient reports likely sinus infection, headache, stuffy nose, bloody scabs, ear pain.  Symptoms started 2 days ago. - Likely viral infection. [name] [name] was in home with her today at lunch feeding the cats.  Spoke with her briefly about symptom relief options.  START mucinex and nasal saline spray.   Discussed same with patient and that she will not need cephalexin.  Patient has chronic rhinitis, nasal bleeding.    LEFT leg swelling also chronic.  Patient states she has been on her feet more than normal today.  Recommended rest, leg elevation.    No visit needed unless sinus complaints do not improve in next 7 days.

## 2019-01-04 ENCOUNTER — Ambulatory Visit (INDEPENDENT_AMBULATORY_CARE_PROVIDER_SITE_OTHER): Payer: Medicare Other | Admitting: Nurse Practitioner

## 2019-01-04 ENCOUNTER — Other Ambulatory Visit: Payer: Self-pay | Admitting: Urology

## 2019-01-04 ENCOUNTER — Encounter: Payer: Self-pay | Admitting: Nurse Practitioner

## 2019-01-04 ENCOUNTER — Other Ambulatory Visit: Payer: Self-pay

## 2019-01-04 DIAGNOSIS — H60392 Other infective otitis externa, left ear: Secondary | ICD-10-CM

## 2019-01-04 DIAGNOSIS — R339 Retention of urine, unspecified: Secondary | ICD-10-CM

## 2019-01-04 MED ORDER — OFLOXACIN 0.3 % OT SOLN: 5.0000 [drp] | Freq: Two times a day (BID) | OTIC | 0 refills | Status: AC

## 2019-01-04 NOTE — Patient Instructions (Signed)
[name],   Thank you for coming in to clinic today.  1. You have otitis externa.  START Ofloxacin otic 3-4 drops into LEFT ear twice daily for next 7 days.  Please schedule a follow-up appointment with Cassell Smiles, AGNP. Follow-up prn in next 3-5 days for no improvement.  If you have any other questions or concerns, please feel free to call the clinic or send a message through [facility]. You may also schedule an earlier appointment if necessary.  You will receive a survey after today's visit either digitally by e-mail or paper by [employer]. Your experiences and feedback matter to [place].  Please respond so we know how we are doing as we provide care for you.   Cassell Smiles, DNP, AGNP-BC Adult Gerontology Nurse Practitioner [place]

## 2019-01-04 NOTE — Progress Notes (Signed)
Subjective:    Patient ID: [name], female    DOB: 1925/09/09, 85 y.o.   MRN: [id number]  [name] is a 85 y.o. female presenting on 01/04/2019 for Ear Pain (the pt states it fells full and  with fluid, popping sounds. She also states it swollen that she cannot put her hearing aid in her ear. Headache and sinus pressure. x 3-4 days )   HPI LEFT ear pain Patient is unable to get hearing aid into ear because it won't fit and it hurts.  Patient has had ear pain for about 3 days and continues getting worse. - Patient has chronic sinus pressure, but notes mild increase on RIGHT side recently.  She is not having increased jaw pain and is still able to wear her dentures without pain. - Patient has had no fever.  Endorses having chills, feeling warm at other times but this is not unusual for patient.   - Patient has taken mucinex and helps with chest congestion, but not helping with ear pain/sinus pressure.  Social History   Tobacco Use  . Smoking status: Never Smoker  . Smokeless tobacco: Never Used  Substance Use Topics  . Alcohol use: No  . Drug use: No    Review of Systems Per HPI unless specifically indicated above     Objective:    There were no vitals taken for this visit.  Wt Readings from Last 3 Encounters:  11/30/18 122 lb 9.6 oz (55.6 kg)  11/11/18 117 lb (53.1 kg)  10/20/18 121 lb 3.2 oz (55 kg)    Physical Exam  Patient remotely monitored.  Verbal communication appropriate.  Cognition normal. Patient is hard of hearing.  Results for orders placed or performed during the hospital encounter of 11/11/18  Urine Culture  Result Value Ref Range   Specimen Description URINE, RANDOM    Special Requests      NONE Performed at [facility], [address] [place], [place] [address]    Culture 20,000 COLONIES/mL STAPHYLOCOCCUS EPIDERMIDIS (A)    Report Status 11/14/2018 FINAL    Organism ID, Bacteria STAPHYLOCOCCUS EPIDERMIDIS (A)    Susceptibility   Staphylococcus epidermidis - MIC*    CIPROFLOXACIN <=0.5 SENSITIVE Sensitive     GENTAMICIN <=0.5 SENSITIVE Sensitive     NITROFURANTOIN <=16 SENSITIVE Sensitive     OXACILLIN >=4 RESISTANT Resistant     TETRACYCLINE 2 SENSITIVE Sensitive     VANCOMYCIN 2 SENSITIVE Sensitive     TRIMETH/SULFA 160 RESISTANT Resistant     CLINDAMYCIN >=8 RESISTANT Resistant     RIFAMPIN <=0.5 SENSITIVE Sensitive     Inducible Clindamycin NEGATIVE Sensitive     * 20,000 COLONIES/mL STAPHYLOCOCCUS EPIDERMIDIS  CBC  Result Value Ref Range   WBC 7.5 4.0 - 10.5 K/uL   RBC 4.57 3.87 - 5.11 MIL/uL   Hemoglobin 12.4 12.0 - 15.0 g/dL   HCT 38.2 36.0 - 46.0 %   MCV 83.6 80.0 - 100.0 fL   MCH 27.1 26.0 - 34.0 pg   MCHC 32.5 30.0 - 36.0 g/dL   RDW 14.3 11.5 - 15.5 %   Platelets 215 150 - 400 K/uL   nRBC 0.0 0.0 - 0.2 %  Comprehensive metabolic panel  Result Value Ref Range   Sodium 135 135 - 145 mmol/L   Potassium 3.7 3.5 - 5.1 mmol/L   Chloride 103 98 - 111 mmol/L   CO2 26 22 - 32 mmol/L   Glucose, Bld 101 (H) 70 -  99 mg/dL   BUN 9 8 - 23 mg/dL   Creatinine, Ser 0.49 0.44 - 1.00 mg/dL   Calcium 9.8 8.9 - 10.3 mg/dL   Total Protein 7.3 6.5 - 8.1 g/dL   Albumin 4.4 3.5 - 5.0 g/dL   AST 24 15 - 41 U/L   ALT 15 0 - 44 U/L   Alkaline Phosphatase 89 38 - 126 U/L   Total Bilirubin 0.9 0.3 - 1.2 mg/dL   GFR calc non Af Amer >60 >60 mL/min   GFR calc Af Amer >60 >60 mL/min   Anion gap 6 5 - 15  Lipase, blood  Result Value Ref Range   Lipase 32 11 - 51 U/L  Urinalysis, Complete w Microscopic  Result Value Ref Range   Color, Urine STRAW (A) YELLOW   APPearance HAZY (A) CLEAR   Specific Gravity, Urine 1.003 (L) 1.005 - 1.030   pH 9.0 (H) 5.0 - 8.0   Glucose, UA NEGATIVE NEGATIVE mg/dL   Hgb urine dipstick MODERATE (A) NEGATIVE   Bilirubin Urine NEGATIVE NEGATIVE   Ketones, ur NEGATIVE NEGATIVE mg/dL   Protein, ur NEGATIVE NEGATIVE mg/dL   Nitrite NEGATIVE NEGATIVE   Leukocytes, UA  NEGATIVE NEGATIVE   RBC / HPF 11-20 0 - 5 RBC/hpf   WBC, UA 0-5 0 - 5 WBC/hpf   Bacteria, UA RARE (A) NONE SEEN   Squamous Epithelial / LPF 0-5 0 - 5   Budding Yeast PRESENT       Assessment & Plan:   Problem List Items Addressed This Visit    None      No orders of the defined types were placed in this encounter. Clinically consistent with LEFT ear otitis externa.  Patient uses hearing aid regularly and may have small scratch leading to entry of pathogen.  Unable to view TM today as patient is not physically present in clinic.   - Concerns include potential Left AOM as well.  No systemic symptoms and afebrile per report.  Plan: 1. Start Ofloxacin otic antibiotic drops Left ear 4 drops BID x 7 days 2. Leave hearing aid out for next 7 days. 3. Start Ibuprofen / Tylenol PRN pain 4. Avoid water in ear, no Q-tips.   5. May consider amoxicillin/Augmentin Monday if not improving.  Patient instructed to call for brief follow-up. - REVIEWED all instructions with patient's daughter today prior to end of visit.   Follow up plan: Follow-up Monday if pain/sinus pressure persists.  Disclosed to patient at start of encounter that we will bill telephone services and she will receive bill of services provided.  Patient consents to be treated via phone prior to discussion. - Patient is at her home and is accessed via telephone. - Services are provided from [facility]. - Time spent in direct consultation with patient on phone: 7 minutes    Cassell Smiles, DNP, AGPCNP-BC Adult Gerontology Primary Care Nurse Practitioner [facility] 01/04/2019, 10:05 AM

## 2019-01-05 ENCOUNTER — Other Ambulatory Visit: Payer: Self-pay

## 2019-01-05 ENCOUNTER — Telehealth: Payer: Self-pay

## 2019-01-05 DIAGNOSIS — H65192 Other acute nonsuppurative otitis media, left ear: Secondary | ICD-10-CM

## 2019-01-05 MED ORDER — AMOXICILLIN-POT CLAVULANATE 875-125 MG PO TABS: 1.0000 | ORAL_TABLET | Freq: Two times a day (BID) | ORAL | 0 refills | Status: DC

## 2019-01-05 NOTE — Telephone Encounter (Addendum)
Cc: pt called complaining of dysuria, urinary frequency x 4 days. The pt state she took a home urine test that was positive. She states that cephalexin is the only thing that works for her UTI's.   She also is complaining of dizziness x 1 day. She took dramamine last night and this morning. It help with the dizziness. The pt want to know if its safe to take.

## 2019-01-05 NOTE — Telephone Encounter (Signed)
She can continue taking dramamine, but I am concerned that she could have inner ear infection given our telephone encounter yesterday.   For acute otitis media, START Augmentin 875-125 mg one tablet every 12 hours for 10 days.  This will possibly also treat a UTI if she has one.  We can follow-up after Thursday next week about urinary symptoms if not improved or if they are worsening. - Patient requested amoxicillin yesterday, so this is what we will use.  She tolerated it well recently.

## 2019-01-05 NOTE — Telephone Encounter (Signed)
The pt was notified. She verbalize understanding.  

## 2019-01-09 ENCOUNTER — Telehealth: Payer: Self-pay

## 2019-01-09 DIAGNOSIS — N3000 Acute cystitis without hematuria: Secondary | ICD-10-CM

## 2019-01-09 MED ORDER — CEPHALEXIN 500 MG PO CAPS: 500.0000 mg | ORAL_CAPSULE | Freq: Three times a day (TID) | ORAL | 0 refills | Status: DC

## 2019-01-09 NOTE — Telephone Encounter (Signed)
Covering inbox for [name], AGPCNP-BC while she is out of office.  Reviewed chart and recent phone conversation with PCP on chart.  Since she has done better with Keflex in past for possible UTI now that was not previously tested since has only done telephone call for these problems recently.  Given that she has already taken Augmentin antibiotic, and it is after 5pm today, I would agree to go ahead and treat with Keflex - DC Augmentin and she can start Keflex 500mg  TID x 7 days - sent to [place], I called [employer] and spoke with Photographer (pharmacist) to confirm that patient tolerates keflex as she has a PCN allergy listed on chart, and she confirmed that patient has taken it many times before and verbally acknowledged that it is okay to take with her listed allergy. She will notify patient that medicine is ready for pick up today 3/31.  I would still ask that patient do a virtual telephone encounter to follow-up with her PCP [name], AGPCNP-BC within 1 week after this treatment to make sure all of her symptoms were treated including this UTI, since she has been receiving telephone call advice only for this problem and she has requested changes to her treatment plan.  [name], [facility] 01/09/2019, 5:10 PM

## 2019-01-09 NOTE — Telephone Encounter (Signed)
The pt called complaining of severe dysuria x 5days. She states that she took the Augmentin once and developed diarrhea so she discontinued it. The Augmentin was prescribed for acute otitis media, but [name] states it might help with her possible UTI. She is requesting that you switch the abx to cephalexin, because this is the only thing that helps with her UTI.  Please advise

## 2019-01-19 ENCOUNTER — Other Ambulatory Visit: Payer: Self-pay

## 2019-01-19 ENCOUNTER — Encounter: Payer: Self-pay | Admitting: Nurse Practitioner

## 2019-01-19 ENCOUNTER — Ambulatory Visit (INDEPENDENT_AMBULATORY_CARE_PROVIDER_SITE_OTHER): Payer: Medicare Other | Admitting: Nurse Practitioner

## 2019-01-19 ENCOUNTER — Ambulatory Visit: Payer: Medicare Other | Admitting: Nurse Practitioner

## 2019-01-19 DIAGNOSIS — H9202 Otalgia, left ear: Secondary | ICD-10-CM | POA: Diagnosis not present

## 2019-01-19 DIAGNOSIS — I10 Essential (primary) hypertension: Secondary | ICD-10-CM | POA: Diagnosis not present

## 2019-01-19 DIAGNOSIS — R601 Generalized edema: Secondary | ICD-10-CM

## 2019-01-19 DIAGNOSIS — F411 Generalized anxiety disorder: Secondary | ICD-10-CM

## 2019-01-19 MED ORDER — FUROSEMIDE 20 MG PO TABS: ORAL_TABLET | ORAL | 1 refills | Status: DC

## 2019-01-19 MED ORDER — MIRTAZAPINE 30 MG PO TABS: 30.0000 mg | ORAL_TABLET | Freq: Every day | ORAL | 1 refills | Status: DC

## 2019-01-19 MED ORDER — AMLODIPINE BESYLATE 5 MG PO TABS: 5.0000 mg | ORAL_TABLET | Freq: Every day | ORAL | 1 refills | Status: DC

## 2019-01-19 MED ORDER — CARVEDILOL 3.125 MG PO TABS: 6.2500 mg | ORAL_TABLET | Freq: Two times a day (BID) | ORAL | 1 refills | Status: DC

## 2019-01-19 MED ORDER — LORAZEPAM 0.5 MG PO TABS: 0.5000 mg | ORAL_TABLET | Freq: Three times a day (TID) | ORAL | 5 refills | Status: DC | PRN

## 2019-01-19 MED ORDER — AMOXICILLIN 500 MG PO TABS: 500.0000 mg | ORAL_TABLET | Freq: Two times a day (BID) | ORAL | 0 refills | Status: AC

## 2019-01-19 NOTE — Progress Notes (Signed)
Telemedicine Encounter: Disclosed to patient at start of encounter that we will provide appropriate telemedicine services.  Patient consents to be treated via phone prior to discussion. - Patient is at her home and is accessed via telephone. - Services are provided by Cassell Smiles from [facility].  Subjective:    Patient ID: [name], female    DOB: 10-08-1925, 85 y.o.   MRN: [id number]  [name] is a 85 y.o. female presenting on 01/19/2019 for Hypertension; Constipation; and Ear Pain (left ear pain x 4-5 days. Pt states it feels full and like its draining )  HPI Hypertension - She is checking BP at home or outside of clinic.  Readings 115/70 approx last time it was checked on Saturday. This is usual reading. - Current medications: amlodipine 5 mg once daily, carvedilol 3.125 mg twice daily, furosemide 20 mg once daily tolerating well without side effects - She is symptomatic with leg swelling (not worsened from usual). - Pt denies headache, lightheadedness, dizziness, changes in vision, chest tightness/pressure, palpitations, leg swelling, sudden loss of speech or loss of consciousness. - She  reports no regular exercise routine. - Her diet is moderate in salt, moderate in fat, and moderate in carbohydrates.   PVD Patient with persistent leg swelling.  Continues to wear compression socks with some relief of leg pain due to peripheral vascular disease.  Continues furosemide during day.  Left ear pain LEFT ear pain persists.  Patient never took Augmentin and did not have full relief with Keflex for perceived UTI.  Dizziness she complained of has resolved, but she continues to feel ear pain.  Will continue with treatment for possible left AOM.  Constipation Patient stopped miralax. Just restarted this to treat her constipation.  Patient reports no other problems with constipation.  Anxiety Patient continues taking her lorazepam three times daily.  She  also continues on mirtazapine at bedtime.  She did not start her other SSRI.  Tolerates mirtazapine well without side effects.  GAD 7 : Generalized Anxiety Score 01/19/2019  Nervous, Anxious, on Edge 0  Control/stop worrying 0  Worry too much - different things 0  Trouble relaxing 0  Restless 0  Easily annoyed or irritable 0  Afraid - awful might happen 0  Total GAD 7 Score 0  Anxiety Difficulty Not difficult at all    Depression screen [facility] 2/9 01/19/2019 07/07/2018 04/28/2018 10/24/2017  Decreased Interest 0 0 0 3  Down, Depressed, Hopeless 0 0 0 3  PHQ - 2 Score 0 0 0 6  Altered sleeping 0 - - 3  Tired, decreased energy 1 - - 3  Change in appetite 0 - - 0  Feeling bad or failure about yourself  0 - - 0  Trouble concentrating 0 - - 0  Moving slowly or fidgety/restless 0 - - 0  Suicidal thoughts 0 - - 0  PHQ-9 Score 1 - - 12  Difficult doing work/chores Not difficult at all - - Somewhat difficult    Social History   Tobacco Use  . Smoking status: Never Smoker  . Smokeless tobacco: Never Used  Substance Use Topics  . Alcohol use: No  . Drug use: No    Review of Systems Per HPI unless specifically indicated above     Objective:    There were no vitals taken for this visit.  Wt Readings from Last 3 Encounters:  11/30/18 122 lb 9.6 oz (55.6 kg)  11/11/18 117 lb (53.1 kg)  10/20/18 121 lb 3.2 oz (55 kg)    Physical Exam Patient remotely monitored.  Verbal communication appropriate.  Cognition normal.      Assessment & Plan:   Problem List Items Addressed This Visit      Cardiovascular and Mediastinum   Essential (primary) hypertension Controlled hypertension.  BP goal < 130/80.  Pt is continuing lifestyle modifications.  Taking medications tolerating well without side effects.   Plan: 1. Continue taking amlodipine, carvedilol and furosemide 2. Obtain labs next visit  3. Encouraged heart healthy diet and increasing exercise to 30 minutes most days of the week. 4.  Check BP 1-2 x per week at home, keep log, and bring to clinic at next appointment. 5. Follow up 3 months.     Relevant Medications   amLODipine (NORVASC) 5 MG tablet   carvedilol (COREG) 3.125 MG tablet   furosemide (LASIX) 20 MG tablet     Other   Generalized anxiety disorder Remains suboptimally controlled per patient, but is stable and controlled per PHQ9 and GAD7.  Plan: 1. Continue lorazepam 0.5 mg tid prn anxiety.   2. Continue mirtazapine 30 mg at bedtime. 3. Continue regular hobbies/ daily activities. 4. Follow-up 4 months and sooner if needed.   Relevant Medications   LORazepam (ATIVAN) 0.5 MG tablet   mirtazapine (REMERON) 30 MG tablet    Other Visit Diagnoses    Left ear pain    -  Primary Presumed AOM, not fully resolved with past abx use.  Patient did not tolerate Augmentin.  Change to amoxicillin 500 mg bid x 10 days.  Follow-up prn.   Relevant Medications   amoxicillin (AMOXIL) 500 MG tablet   Generalized edema     Due to PAD.  Continue furosemide, continue compression stockings and elevation of feet.  Follow-up 4 months and sooner prn if worsening.  Goal: prevent cellulitis, which patient has had in past.   Relevant Medications   furosemide (LASIX) 20 MG tablet      Meds ordered this encounter  Medications  . amoxicillin (AMOXIL) 500 MG tablet    Sig: Take 1 tablet (500 mg total) by mouth every 12 (twelve) hours for 10 days.    Dispense:  20 tablet    Refill:  0    Order Specific Question:   Supervising Provider    Answer:   Olin Hauser [[id number]]  . amLODipine (NORVASC) 5 MG tablet    Sig: Take 1 tablet (5 mg total) by mouth daily.    Dispense:  90 tablet    Refill:  1    Order Specific Question:   Supervising Provider    Answer:   Olin Hauser [[id number]]  . carvedilol (COREG) 3.125 MG tablet    Sig: Take 2 tablets (6.25 mg total) by mouth 2 (two) times daily with a meal.    Dispense:  180 tablet    Refill:  1    Order Specific  Question:   Supervising Provider    Answer:   Olin Hauser [[id number]]  . furosemide (LASIX) 20 MG tablet    Sig: Take 1 tablet po QD prn    Dispense:  90 tablet    Refill:  1    Order Specific Question:   Supervising Provider    Answer:   Olin Hauser [[id number]]  . LORazepam (ATIVAN) 0.5 MG tablet    Sig: Take 1 tablet (0.5 mg total) by mouth 3 (three) times daily as needed for anxiety.  Dispense:  90 tablet    Refill:  5    Order Specific Question:   Supervising Provider    Answer:   Olin Hauser [[id number]]  . mirtazapine (REMERON) 30 MG tablet    Sig: Take 1 tablet (30 mg total) by mouth at bedtime.    Dispense:  90 tablet    Refill:  1    Order Specific Question:   Supervising Provider    Answer:   Olin Hauser [[id number]]   - Time spent in direct consultation with patient via telemedicine about above concerns: 15 minutes  Follow up plan: Return in about 4 months (around 05/21/2019) for anxiety, hypertension, PVD.  Cassell Smiles, DNP, AGPCNP-BC Adult Gerontology Primary Care Nurse Practitioner [facility] 01/19/2019, 10:22 AM

## 2019-02-14 ENCOUNTER — Telehealth: Payer: Self-pay

## 2019-02-14 NOTE — Telephone Encounter (Signed)
Covering inbox for [name], AGPCNP-BC while she is out of office on maternity leave.  Agree with triage. Patient was recently treated by PCP for otitis externa on 3/26 and then possible AOM on 4/10, she has followed by [facility] in past - I agree that she should contact them for further management of her ear symptoms.  For her concern on rib pain after bumping into countertop, we can discuss it at virtual visit if she prefers - most likely there are not many treatment options other than conservative care. This is low impact injury unlikely to warrant x-rays at this time.  [name], DO [facility] 02/14/2019, 1:14 PM

## 2019-02-14 NOTE — Telephone Encounter (Signed)
The pt called complaining of persistent Lt ear pain that hasn't resolved since last treatment. She also is complaining of Rt rib pain that she contributes to a recent injury x 2 days ago. She states she was walking  through the entrances way of her kitchen while her daughter was exiting. Her daughter accidentally bumped into her causing her to hit her Rt side up against the kitchen countertop. I scheduled her for Telephone visit for tomorrow at 9:40am. I also recommended the patient to call her ENT specialist Dr. Lula Olszewski to see if maybe they can do virtual visit as well. She verbalize understanding.

## 2019-02-15 ENCOUNTER — Encounter: Payer: Self-pay | Admitting: Family Medicine

## 2019-02-15 ENCOUNTER — Other Ambulatory Visit: Payer: Self-pay

## 2019-02-15 ENCOUNTER — Ambulatory Visit (INDEPENDENT_AMBULATORY_CARE_PROVIDER_SITE_OTHER): Payer: Medicare Other | Admitting: Family Medicine

## 2019-02-15 DIAGNOSIS — S20211A Contusion of right front wall of thorax, initial encounter: Secondary | ICD-10-CM | POA: Diagnosis not present

## 2019-02-15 DIAGNOSIS — H9202 Otalgia, left ear: Secondary | ICD-10-CM

## 2019-02-15 DIAGNOSIS — H6690 Otitis media, unspecified, unspecified ear: Secondary | ICD-10-CM | POA: Diagnosis not present

## 2019-02-15 DIAGNOSIS — I10 Essential (primary) hypertension: Secondary | ICD-10-CM

## 2019-02-15 MED ORDER — CARVEDILOL 6.25 MG PO TABS: 6.2500 mg | ORAL_TABLET | Freq: Two times a day (BID) | ORAL | 1 refills | Status: DC

## 2019-02-15 MED ORDER — AMOXICILLIN-POT CLAVULANATE 875-125 MG PO TABS: 1.0000 | ORAL_TABLET | Freq: Two times a day (BID) | ORAL | 0 refills | Status: DC

## 2019-02-15 NOTE — Patient Instructions (Signed)
AVS given by phone. No mychart

## 2019-02-15 NOTE — Progress Notes (Signed)
Virtual Visit via Telephone The purpose of this virtual visit is to provide medical care while limiting exposure to the novel coronavirus (COVID19) for both patient and office staff.  Consent was obtained for phone visit:  Yes.   Answered questions that patient had about telehealth interaction:  Yes.   I discussed the limitations, risks, security and privacy concerns of performing an evaluation and management service by telephone. I also discussed with the patient that there may be a patient responsible charge related to this service. The patient expressed understanding and agreed to proceed.  Patient Location: Home Provider Location: [facility] (Office)  PCP is Cassell Smiles, AGPCNP-BC - I am currently covering during her maternity leave.   ---------------------------------------------------------------------- Chief Complaint  Patient presents with  . Flank Pain    right side hurts at night bumped to her daughter at kitchen counter yesterday  . Ear Pain    S: Reviewed CMA documentation. I have called patient and gathered additional HPI as follows:  Follow-up Right Side/Flank pain - Reports history of chronic back surgery 14 year ago, continues to take pain medication for this - She now reports new episode yesterday accidentally bumped into kitchen counter, did not develop bruise but had sore area at rib / side / flank - Today it is improved, no new questions on this  Left Ear Pain Last seen by PCP 12/2018 and 01/2019, treated for otitis externa and then AOM in Left ear, given ear drops initially then amoxicillin course. Her symptoms nearly resolved then worse again - She has hearing aid on this L side, previously followed by [facility] cannot go in person at this time, so she asked to return here - In past augmentin has worked - Denies drainage from ear, sinus pain, fever chills   Denies any high risk travel to areas of current concern for COVID19. Denies any  known or suspected exposure to person with or possibly with COVID19.  Denies any fevers, chills, sweats, body ache, cough, shortness of breath, sinus pain or pressure, headache, abdominal pain, diarrhea  Past Medical History:  Diagnosis Date  . Allergy    environmental  . Anxiety   . Chronic back pain   . High cholesterol   . History of urinary retention   . Hypertension   . Migraines   . Osteoporosis   . Pneumonia   . Sinusitis    Social History   Tobacco Use  . Smoking status: Never Smoker  . Smokeless tobacco: Never Used  Substance Use Topics  . Alcohol use: No  . Drug use: No    Current Outpatient Medications:  .  amLODipine (NORVASC) 5 MG tablet, Take 1 tablet (5 mg total) by mouth daily., Disp: 90 tablet, Rfl: 1 .  carvedilol (COREG) 6.25 MG tablet, Take 1 tablet (6.25 mg total) by mouth 2 (two) times daily with a meal., Disp: 180 tablet, Rfl: 1 .  furosemide (LASIX) 20 MG tablet, Take 1 tablet po QD prn, Disp: 90 tablet, Rfl: 1 .  HYDROcodone-acetaminophen (NORCO) 10-325 MG tablet, Take 1 tablet by mouth every 4 (four) hours as needed for moderate pain. , Disp: , Rfl:  .  LORazepam (ATIVAN) 0.5 MG tablet, Take 1 tablet (0.5 mg total) by mouth 3 (three) times daily as needed for anxiety., Disp: 90 tablet, Rfl: 5 .  mirtazapine (REMERON) 30 MG tablet, Take 1 tablet (30 mg total) by mouth at bedtime., Disp: 90 tablet, Rfl: 1 .  tamsulosin (FLOMAX) 0.4 MG  CAPS capsule, Take 1 capsule (0.4 mg total) by mouth daily., Disp: 30 capsule, Rfl: 0 .  amoxicillin-clavulanate (AUGMENTIN) 875-125 MG tablet, Take 1 tablet by mouth 2 (two) times daily. For 10 days, Disp: 20 tablet, Rfl: 0  Depression screen [facility] 2/9 01/19/2019 07/07/2018 04/28/2018  Decreased Interest 0 0 0  Down, Depressed, Hopeless 0 0 0  PHQ - 2 Score 0 0 0  Altered sleeping 0 - -  Tired, decreased energy 1 - -  Change in appetite 0 - -  Feeling bad or failure about yourself  0 - -  Trouble concentrating 0 - -   Moving slowly or fidgety/restless 0 - -  Suicidal thoughts 0 - -  PHQ-9 Score 1 - -  Difficult doing work/chores Not difficult at all - -    GAD 7 : Generalized Anxiety Score 02/15/2019 01/19/2019  Nervous, Anxious, on Edge (No Data) 0  Control/stop worrying - 0  Worry too much - different things - 0  Trouble relaxing - 0  Restless - 0  Easily annoyed or irritable - 0  Afraid - awful might happen - 0  Total GAD 7 Score - 0  Anxiety Difficulty - Not difficult at all    -------------------------------------------------------------------------- O: No physical exam performed due to remote telephone encounter.  Lab results reviewed.  No results found for this or any previous visit (from the past 2160 hour(s)).  -------------------------------------------------------------------------- A&P:  Problem List Items Addressed This Visit    Essential (primary) hypertension Changed rx order from two x 3.125 pills now to 1 6.25mg  pill due to size    Relevant Medications   carvedilol (COREG) 6.25 MG tablet    Other Visit Diagnoses    Left ear pain    -  Primary   Relevant Medications   amoxicillin-clavulanate (AUGMENTIN) 875-125 MG tablet   Recurrent AOM (acute otitis media)      Clinically suspicious for recurrent AOM since last antibiotic nearly resolved problem, and it seems worse again, question if affected by hearing aid on this side - Prior history has responded to antibiotics - Will switch to Augmentin course now about 1 month later trial - Advised her that if not improving, she needs to go to ENT next for further evaluation    Relevant Medications   amoxicillin-clavulanate (AUGMENTIN) 875-125 MG tablet   Contusion of rib on right side, initial encounter   IMPROVING Continue conservative care Already has pain medication May use topical ice packs, muscle rub       Meds ordered this encounter  Medications  . amoxicillin-clavulanate (AUGMENTIN) 875-125 MG tablet    Sig:  Take 1 tablet by mouth 2 (two) times daily. For 10 days    Dispense:  20 tablet    Refill:  0  . carvedilol (COREG) 6.25 MG tablet    Sig: Take 1 tablet (6.25 mg total) by mouth 2 (two) times daily with a meal.    Dispense:  180 tablet    Refill:  1    Change from 3.125 up to 6.25 pill    Follow-up: - Return as needed for ear pain  Patient verbalizes understanding with the above medical recommendations including the limitation of remote medical advice.  Specific follow-up and call-back criteria were given for patient to follow-up or seek medical care more urgently if needed.   - Time spent in direct consultation with patient on phone: 11 minutes   [name], [facility] 02/15/2019,  9:41 AM

## 2019-02-16 ENCOUNTER — Telehealth: Payer: Self-pay

## 2019-02-16 DIAGNOSIS — H6690 Otitis media, unspecified, unspecified ear: Secondary | ICD-10-CM

## 2019-02-16 DIAGNOSIS — H9202 Otalgia, left ear: Secondary | ICD-10-CM

## 2019-02-16 MED ORDER — AMOXICILLIN 500 MG PO CAPS: 500.0000 mg | ORAL_CAPSULE | Freq: Two times a day (BID) | ORAL | 0 refills | Status: DC

## 2019-02-16 NOTE — Telephone Encounter (Signed)
[name] called regarding Abx that was Rx for ear pain. Patient is allergic to amoxicillin-clavulanate (AUGMENTIN) 875-125 MG tablet that was Rx yesterday since it was Rx in past and Lauren had to switched to plain amoxicillin (AMOXIL) 500 MG tablet. She is asking to switch the antibiotics send it to same pharmacy.

## 2019-02-16 NOTE — Telephone Encounter (Signed)
Switched from augmentin to amoxil  Of note augmentin was not listed as an allergy  [name], DO [facility] 02/16/2019, 12:19 PM

## 2019-03-07 ENCOUNTER — Telehealth: Payer: Self-pay

## 2019-03-07 DIAGNOSIS — H6690 Otitis media, unspecified, unspecified ear: Secondary | ICD-10-CM

## 2019-03-07 DIAGNOSIS — H9202 Otalgia, left ear: Secondary | ICD-10-CM

## 2019-03-07 MED ORDER — AMOXICILLIN 500 MG PO CAPS: 500.0000 mg | ORAL_CAPSULE | Freq: Two times a day (BID) | ORAL | 0 refills | Status: DC

## 2019-03-07 NOTE — Telephone Encounter (Signed)
Alright. Sent rx Amoxicillin - called [facility]. Explained that it was a communication issue because we did not tell rx was sent yet - but she still went to pharmacy. They will resolve the issue and get med ready for her  [name], [facility] 03/07/2019, 4:52 PM

## 2019-03-07 NOTE — Telephone Encounter (Signed)
She was just treated by me on 02/15/19 with Amoxicillin.  I am not sure if it improved and then got worse again?  I don't know how much else I have to offer. I know from speaking with Lauren (PCP) about this patient before, she has often relied on Amoxicillin or antibiotics for ear pain even if there was no infection.  I would recommend checking to see if the last Amoxicillin nearly resolved her issue and when it returned.  If her symptoms did not resolve on the Amoxicillin this time, then I would recommend waiting to speak with ENT next week at her apt. She may go to an Urgent Care in meantime if needed.  Otherwise if the Amoxicillin mostly took care of her ear symptoms 3-4 weeks ago, and now it just returned - we can consider one more course until she can see Dr Pryor Ochoa.  [name], [facility] 03/07/2019, 2:15 PM

## 2019-03-07 NOTE — Telephone Encounter (Signed)
As per triage nurse spoke to the patient her symptoms started over week-end prefers another round of plain Amoxicillin send to [facility] and she will keep her appointment with ENT next week.

## 2019-03-07 NOTE — Telephone Encounter (Signed)
The pt called complaining of severe left ear pain x 5 days. I ask the patient if she contacted [facility]. She said they cannot see her until next week. I called [facility] and was told Dr. Lula Olszewski is out of the office into next week I schedule an appt for the patient on June 3rd at 9:00am. The patient said she cannot tolerate the pain until next week. She is requesting that you call in Amoxicillin.

## 2019-03-15 DIAGNOSIS — H6062 Unspecified chronic otitis externa, left ear: Secondary | ICD-10-CM | POA: Diagnosis not present

## 2019-03-15 DIAGNOSIS — R040 Epistaxis: Secondary | ICD-10-CM | POA: Diagnosis not present

## 2019-03-22 IMAGING — CR DG ABDOMEN ACUTE W/ 1V CHEST
4 series · 4 of 4 positions shown · non-contrast
Comparison: Abdominal radiographs 10/01/2016, chest radiograph
12/10/2016

CLINICAL DATA: Constipation and difficulty urinating since
[REDACTED], fell backwards onto a brick hearth 1 week ago, sacral
pain

EXAM:
DG ABDOMEN ACUTE W/ 1V CHEST

[chest pa]
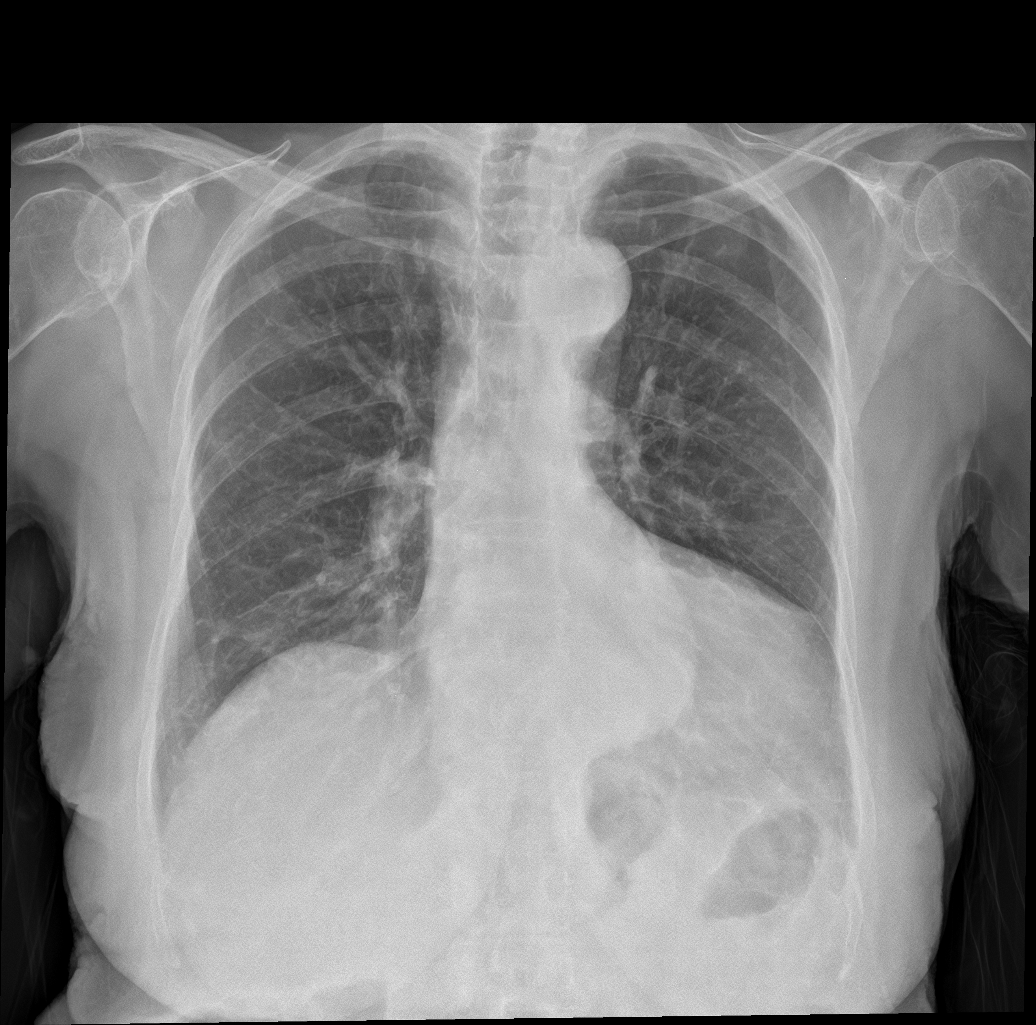

[abdomen erect]
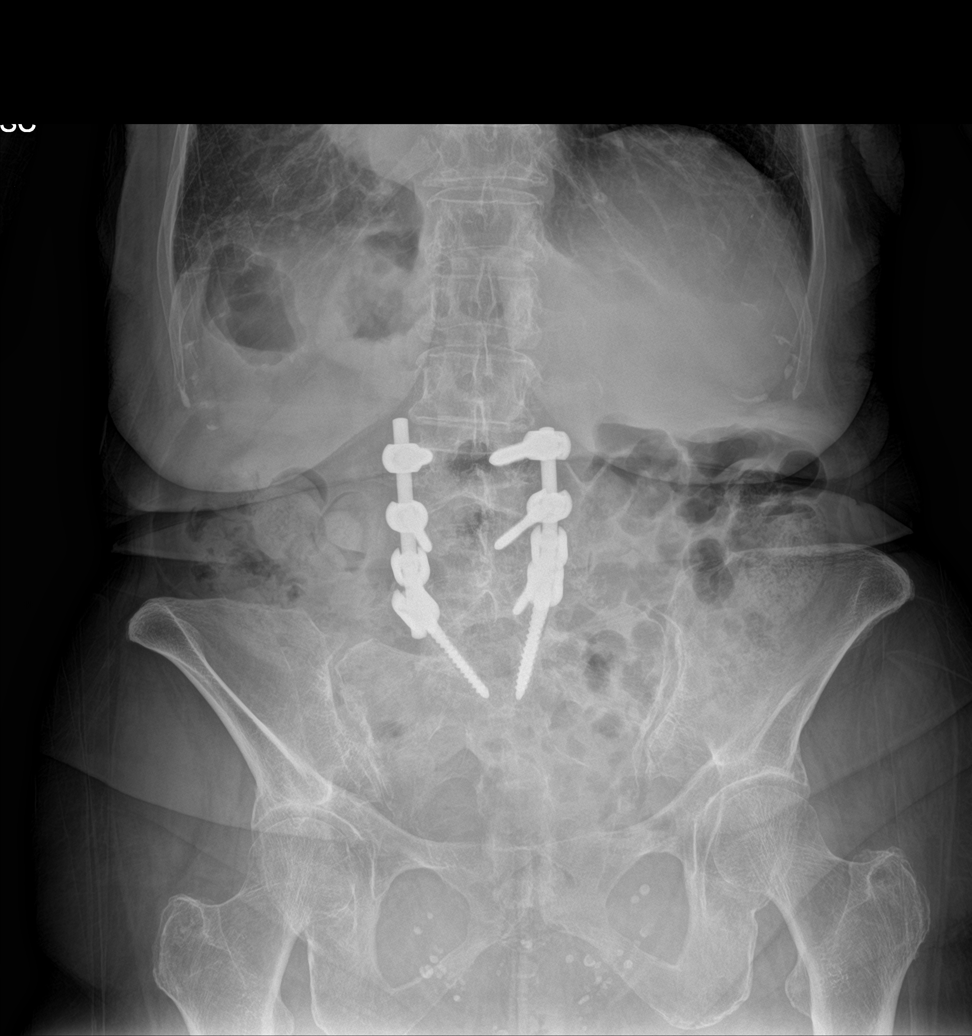

[abdomen supine (1 of 2)]
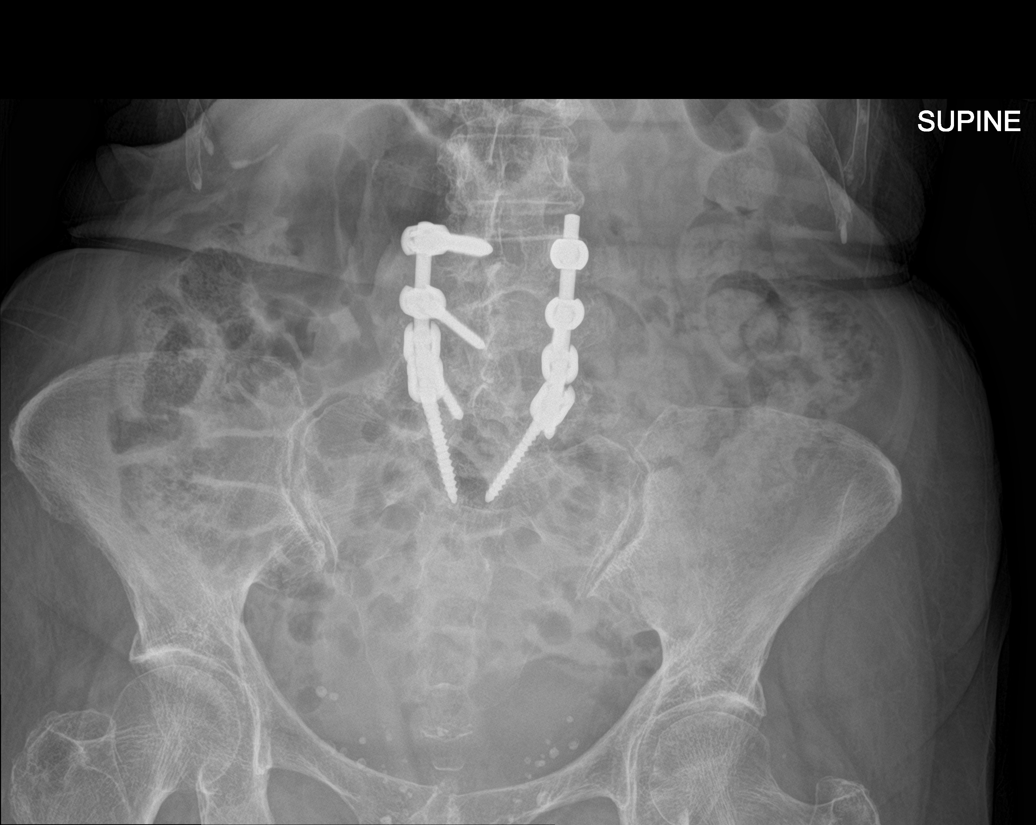

[abdomen supine (2 of 2)]
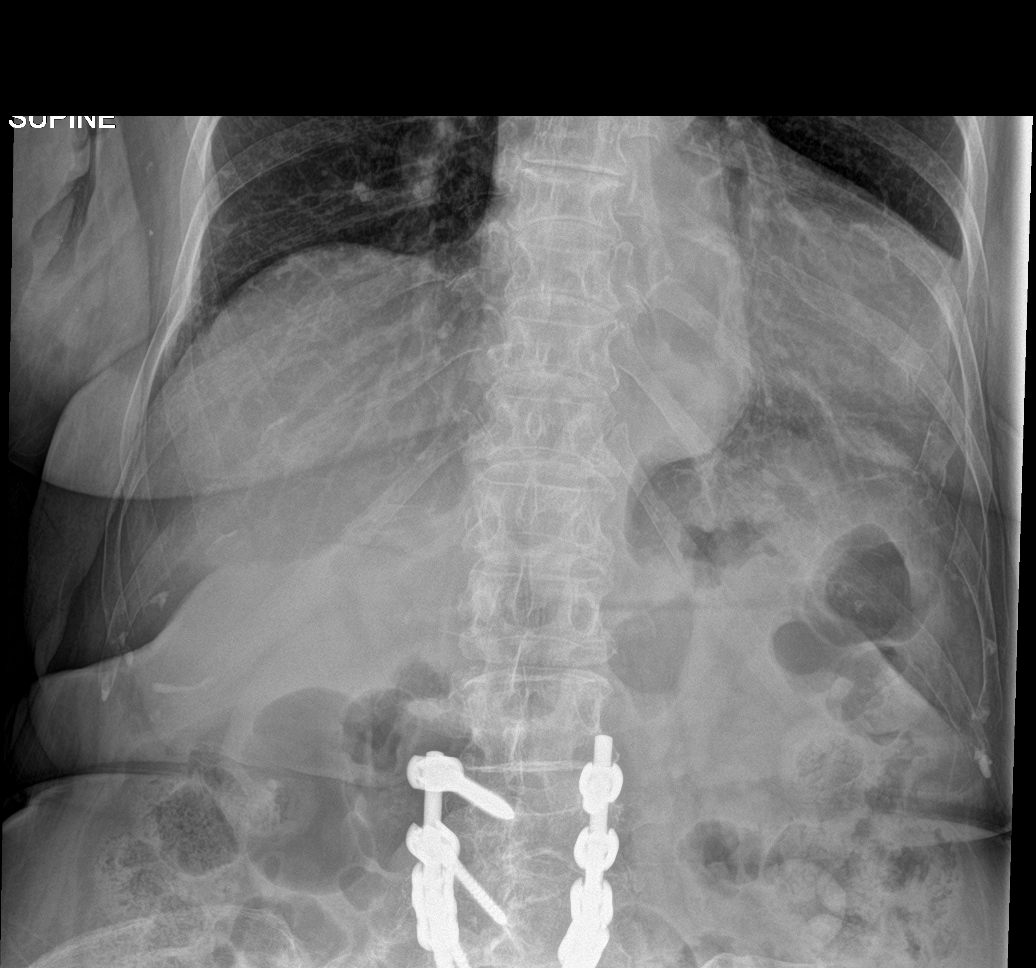

[4 of 4 positions shown; findings below may reference images not displayed]

FINDINGS: Enlargement of cardiac silhouette.

Tortuous thoracic aorta.

Mediastinal contours and pulmonary vascularity otherwise normal.

Emphysematous changes with minimal RIGHT basilar atelectasis.

No acute infiltrate, pleural effusion or pneumothorax.

Nonobstructive bowel gas pattern.

No bowel dilatation, bowel wall thickening, or free air.

Numerous pelvic phleboliths without definite urinary tract
calcification.

Osseous demineralization with prior L3-S1 fusion.
IMPRESSION: No acute abdominal abnormalities.

Enlargement of cardiac silhouette.

Emphysematous changes with minimal RIGHT basilar atelectasis.

Emphysema (45MM2-TBB.S).

## 2019-03-26 DIAGNOSIS — I10 Essential (primary) hypertension: Secondary | ICD-10-CM | POA: Diagnosis not present

## 2019-03-26 DIAGNOSIS — M5416 Radiculopathy, lumbar region: Secondary | ICD-10-CM | POA: Diagnosis not present

## 2019-03-26 DIAGNOSIS — M4126 Other idiopathic scoliosis, lumbar region: Secondary | ICD-10-CM | POA: Diagnosis not present

## 2019-03-26 DIAGNOSIS — M5126 Other intervertebral disc displacement, lumbar region: Secondary | ICD-10-CM | POA: Diagnosis not present

## 2019-03-26 DIAGNOSIS — M545 Low back pain: Secondary | ICD-10-CM | POA: Diagnosis not present

## 2019-03-26 DIAGNOSIS — M4316 Spondylolisthesis, lumbar region: Secondary | ICD-10-CM | POA: Diagnosis not present

## 2019-04-05 ENCOUNTER — Telehealth: Payer: Self-pay

## 2019-04-05 NOTE — Telephone Encounter (Signed)
The pt called complaining of possible UTI. Constant vaginal burning that worsen with urination. She also complains of a headache and nervousness. She states all the symptoms started yesterday afternoon. She checked her urine with OTC U/A dipsticks that she got at home. She said it showed positive for a UTI. The pt declined coming in to give a urine sample. She states she is 85 yrs old and her symptoms are to bad to come in the office.

## 2019-04-05 NOTE — Telephone Encounter (Signed)
Scheduled for tomorrow virtual visit  [name], [facility] 04/05/2019, 12:47 PM

## 2019-04-06 ENCOUNTER — Ambulatory Visit (INDEPENDENT_AMBULATORY_CARE_PROVIDER_SITE_OTHER): Payer: Medicare Other | Admitting: Family Medicine

## 2019-04-06 ENCOUNTER — Encounter: Payer: Self-pay | Admitting: Family Medicine

## 2019-04-06 ENCOUNTER — Other Ambulatory Visit: Payer: Self-pay

## 2019-04-06 ENCOUNTER — Other Ambulatory Visit: Payer: Self-pay | Admitting: Urology

## 2019-04-06 DIAGNOSIS — N3000 Acute cystitis without hematuria: Secondary | ICD-10-CM | POA: Diagnosis not present

## 2019-04-06 DIAGNOSIS — R339 Retention of urine, unspecified: Secondary | ICD-10-CM

## 2019-04-06 MED ORDER — CEPHALEXIN 500 MG PO CAPS: 500.0000 mg | ORAL_CAPSULE | Freq: Three times a day (TID) | ORAL | 0 refills | Status: DC

## 2019-04-06 NOTE — Progress Notes (Signed)
Virtual Visit via Telephone The purpose of this virtual visit is to provide medical care while limiting exposure to the novel coronavirus (COVID19) for both patient and office staff.  Consent was obtained for phone visit:  Yes.   Answered questions that patient had about telehealth interaction:  Yes.   I discussed the limitations, risks, security and privacy concerns of performing an evaluation and management service by telephone. I also discussed with the patient that there may be a patient responsible charge related to this service. The patient expressed understanding and agreed to proceed.  Patient Location: Home Provider Location: [facility] (Office)  PCP is Cassell Smiles, AGPCNP-BC - I am currently covering during her maternity leave.   ---------------------------------------------------------------------- Chief Complaint  Patient presents with  . Urinary Tract Infection     Constant vaginal burning that worsen with urination. She also complains of worsen nervousness x 2 days. She checked her urinary with a OTC urine dipstick that came back positive x 2 days ago.     S: Reviewed CMA documentation. I have called patient and gathered additional HPI as follows from both patient and from daughter, [name].  UTI Reports that symptoms started 2 days ago, with home UTI test showed positive nitrite and leukocytes. she has had similar UTI in past, chart says e coli and other bacteria, also history of yeast. She descries dysuria with urinating and at rest sometimes, urinary frequency. - Tried OTC AZO PRN with some temporary relief Denies any hematuria, nausea vomiting flank or abdominal pain, fever chills sweats   Past Medical History:  Diagnosis Date  . Allergy    environmental  . Anxiety   . Chronic back pain   . High cholesterol   . History of urinary retention   . Hypertension   . Migraines   . Osteoporosis   . Pneumonia   . Sinusitis    Social History    Tobacco Use  . Smoking status: Never Smoker  . Smokeless tobacco: Never Used  Substance Use Topics  . Alcohol use: No  . Drug use: No    Current Outpatient Medications:  .  amLODipine (NORVASC) 5 MG tablet, Take 1 tablet (5 mg total) by mouth daily., Disp: 90 tablet, Rfl: 1 .  carvedilol (COREG) 6.25 MG tablet, Take 1 tablet (6.25 mg total) by mouth 2 (two) times daily with a meal., Disp: 180 tablet, Rfl: 1 .  furosemide (LASIX) 20 MG tablet, Take 1 tablet po QD prn, Disp: 90 tablet, Rfl: 1 .  HYDROcodone-acetaminophen (NORCO) 10-325 MG tablet, Take 1 tablet by mouth every 4 (four) hours as needed for moderate pain. , Disp: , Rfl:  .  LORazepam (ATIVAN) 0.5 MG tablet, Take 1 tablet (0.5 mg total) by mouth 3 (three) times daily as needed for anxiety., Disp: 90 tablet, Rfl: 5 .  mirtazapine (REMERON) 30 MG tablet, Take 1 tablet (30 mg total) by mouth at bedtime., Disp: 90 tablet, Rfl: 1 .  phenazopyridine (PYRIDIUM) 95 MG tablet, Take 95 mg by mouth 3 (three) times daily as needed for pain., Disp: , Rfl:  .  tamsulosin (FLOMAX) 0.4 MG CAPS capsule, Take 1 capsule (0.4 mg total) by mouth daily., Disp: 30 capsule, Rfl: 0 .  cephALEXin (KEFLEX) 500 MG capsule, Take 1 capsule (500 mg total) by mouth 3 (three) times daily. For 7 days, Disp: 21 capsule, Rfl: 0  Depression screen [facility] 2/9 04/06/2019 01/19/2019 07/07/2018  Decreased Interest 0 0 0  Down, Depressed, Hopeless 0 0  0  PHQ - 2 Score 0 0 0  Altered sleeping 0 0 -  Tired, decreased energy 0 1 -  Change in appetite 0 0 -  Feeling bad or failure about yourself  0 0 -  Trouble concentrating 0 0 -  Moving slowly or fidgety/restless 0 0 -  Suicidal thoughts 0 0 -  PHQ-9 Score 0 1 -  Difficult doing work/chores Not difficult at all Not difficult at all -    GAD 7 : Generalized Anxiety Score 02/15/2019 01/19/2019  Nervous, Anxious, on Edge (No Data) 0  Control/stop worrying - 0  Worry too much - different things - 0  Trouble relaxing - 0   Restless - 0  Easily annoyed or irritable - 0  Afraid - awful might happen - 0  Total GAD 7 Score - 0  Anxiety Difficulty - Not difficult at all    -------------------------------------------------------------------------- O: No physical exam performed due to remote telephone encounter.  Lab results reviewed.  No results found for this or any previous visit (from the past 2160 hour(s)).  -------------------------------------------------------------------------- A&P:  Problem List Items Addressed This Visit    None    Visit Diagnoses    Acute cystitis without hematuria    -  Primary   Relevant Medications   cephALEXin (KEFLEX) 500 MG capsule     Clinically consistent with UTI Home UA dipstick test suggestive of UTI. Prior Urine cultures reviewed, some resistances, prior yeast as well No concern for pyelo today (no systemic symptoms, neg fever, back pain, n/v).  Plan: 1. Unable to order urine culture patient not in office, declined to come in due to [place] risk 2. Start empiric  Keflex 500mg  TID x 7 days - she has listed cefuroxime and PCN allergy but she declines this and says she can take Keflex without any problem. - Considered diflucan, hold for now would consider if unresolved Improve PO hydration F/u if not improving, return criteria given  Meds ordered this encounter  Medications  . cephALEXin (KEFLEX) 500 MG capsule    Sig: Take 1 capsule (500 mg total) by mouth 3 (three) times daily. For 7 days    Dispense:  21 capsule    Refill:  0    Follow-up: - Return in 1 week as needed UTI  Patient verbalizes understanding with the above medical recommendations including the limitation of remote medical advice.  Specific follow-up and call-back criteria were given for patient to follow-up or seek medical care more urgently if needed.   - Time spent in direct consultation with patient on phone: 8 minutes   [name], [facility] 04/06/2019, 9:03 AM

## 2019-04-06 NOTE — Patient Instructions (Signed)
AVS given by phone. No Mychart

## 2019-04-25 DIAGNOSIS — M4316 Spondylolisthesis, lumbar region: Secondary | ICD-10-CM | POA: Diagnosis not present

## 2019-04-25 DIAGNOSIS — M961 Postlaminectomy syndrome, not elsewhere classified: Secondary | ICD-10-CM | POA: Diagnosis not present

## 2019-05-10 ENCOUNTER — Telehealth: Payer: Self-pay | Admitting: Nurse Practitioner

## 2019-05-10 NOTE — Telephone Encounter (Signed)
Daughter called stating Pt is having issues with her ears, maybe an ear infection? Pt wants to talk to [facility] before setting up an appointment, please call pt cell number.

## 2019-05-10 NOTE — Telephone Encounter (Signed)
No additional advice needed. Patient should follow-up with ENT as referred previously.

## 2019-05-10 NOTE — Telephone Encounter (Addendum)
The pt called complaining of left ear pain x 1 week.  I recommended that the patient contact the ENT as recommended previously about her ear concern. The pt doesn't want to go in for a face to face visit. She states she will try to reach out to them.

## 2019-05-16 ENCOUNTER — Telehealth: Payer: Self-pay | Admitting: Nurse Practitioner

## 2019-05-16 DIAGNOSIS — H9202 Otalgia, left ear: Secondary | ICD-10-CM | POA: Diagnosis not present

## 2019-05-16 DIAGNOSIS — J019 Acute sinusitis, unspecified: Secondary | ICD-10-CM | POA: Diagnosis not present

## 2019-05-16 NOTE — Telephone Encounter (Signed)
The pt was notified of Laurens recommendation. She was extremely upset and crying saying she need some help because she's in excruciating pain. She refuse to call [facility], because she states Dr. Pryor Ochoa is on vacation and no one is there to help her.     I contacted [facility] and spoke with a [place] who informed me that she spoke with the patient on Monday about her ear pain. The pt at that time had not even pick up the ear drops that was prescribed. She stress the important of the patient starting the ear drops. Ms. Zeimet agreed to pick up the ear drops that day. Crystal also informed me that the patient was instructed to call back if her symptoms doesn't improve with treatment and they will schedule her a face to face appt.   I attempted to f/u with Ms. Draughn daughter, no answer. LMOM to return my call per Lauren verbal recommendation.

## 2019-05-16 NOTE — Telephone Encounter (Signed)
This is recurrent ear pain and problems with infection.  Patient has had multiple courses of antibiotics in the past 6 months.  She should see if there is anyone else at Dr. Darien Ramus office that could suggest further course of treatment while he is on vacation.

## 2019-05-16 NOTE — Telephone Encounter (Signed)
pt called saying she had ear pain and Dr. Pryor Ochoa prescribed her some drops for 5 days but her ear is not better and he is on vacation.  She has finished the drops  CB#  [phone]  Con Memos

## 2019-05-22 DIAGNOSIS — R040 Epistaxis: Secondary | ICD-10-CM | POA: Diagnosis not present

## 2019-05-22 DIAGNOSIS — G501 Atypical facial pain: Secondary | ICD-10-CM | POA: Diagnosis not present

## 2019-05-28 NOTE — Telephone Encounter (Signed)
The pt called complaining of severe Left ear pain. She states her ear feels like it's on fire. She reports that she went to the [facility] recently and just finished up Amoxicillin and her symptoms return. I informed the patient that she need to contact Dr. Pryor Ochoa and she said Dr. Pryor Ochoa told her that it is not an infection. The pt state that she doesn't want to see him anymore. Please advise

## 2019-05-28 NOTE — Telephone Encounter (Signed)
Patient stated she has used abx that were left over from walk in clinic.  Patient has started on them and has run out again after 1 day.  Patient claims that they have helped her with the pain. - Patient reports she saw Dr. Pryor Ochoa in person about one week ago and was told she did not have an infection, but used ear drops.  Patient did have interval improvement of pain, but it has now returned.   - Patient has long history of ear pain.  Concern that she may have neuropathy - PATIENT states she is going to be seeing a neurologist.  - Continually requests abx to help with pain "to get through the night." - Instructed patient to ask her daughter to help her with ibuprofen 400 mg tonight for pain.  Will likely need to consider gabapentin in future. - Continues to be very anxious, needs improved anxiety control - Encouraged patient to make in person appointment tomorrow for further evaluation when we can physically examine her ear.

## 2019-05-31 ENCOUNTER — Other Ambulatory Visit: Payer: Self-pay | Admitting: Urology

## 2019-05-31 DIAGNOSIS — R339 Retention of urine, unspecified: Secondary | ICD-10-CM

## 2019-06-01 ENCOUNTER — Other Ambulatory Visit: Payer: Self-pay | Admitting: Urology

## 2019-06-01 DIAGNOSIS — R339 Retention of urine, unspecified: Secondary | ICD-10-CM

## 2019-06-14 ENCOUNTER — Telehealth: Payer: Self-pay

## 2019-06-14 DIAGNOSIS — R413 Other amnesia: Secondary | ICD-10-CM

## 2019-06-14 NOTE — Telephone Encounter (Signed)
Patient cannot continue to be treated via telephone safely.  She needs to have evaluation in office.  Please try to contact patient's daughter to arrange appointment for patient.  I have also placed a referral for SW to CCM.  To SW: - Patient has memory impairment, impulsiveness, is difficult to discuss medical problems with over the phone.  Patient refuses to have in person appointment due to transportation difficulties.  She lives with her daughter who is usually very responsive to bring patient to clinic when she needs to come.  Patient calls regularly during office hours when her daughters are not home about pain complaints and it is very difficult for our staff and for me to console. Patient is often tearful when we request appointments of her.   - Please assess for transportation needs.   - Please assess for needs for caregiver during daytime to assist patient prn.

## 2019-06-14 NOTE — Telephone Encounter (Signed)
Patient called stating she has a UTI.  Daughter came by at lunch and did a strip that was dark purple.  She is refusing to make appointment and wants an ABX called in.

## 2019-06-15 ENCOUNTER — Other Ambulatory Visit: Payer: Self-pay

## 2019-06-15 ENCOUNTER — Telehealth: Payer: Self-pay | Admitting: Nurse Practitioner

## 2019-06-15 ENCOUNTER — Ambulatory Visit (INDEPENDENT_AMBULATORY_CARE_PROVIDER_SITE_OTHER): Payer: Medicare Other | Admitting: Nurse Practitioner

## 2019-06-15 DIAGNOSIS — R300 Dysuria: Secondary | ICD-10-CM

## 2019-06-15 DIAGNOSIS — R110 Nausea: Secondary | ICD-10-CM | POA: Diagnosis not present

## 2019-06-15 MED ORDER — ONDANSETRON HCL 4 MG PO TABS: 4.0000 mg | ORAL_TABLET | Freq: Three times a day (TID) | ORAL | 0 refills | Status: DC | PRN

## 2019-06-15 MED ORDER — CEPHALEXIN 500 MG PO CAPS: 500.0000 mg | ORAL_CAPSULE | Freq: Three times a day (TID) | ORAL | 0 refills | Status: AC

## 2019-06-15 NOTE — Telephone Encounter (Signed)
Spoke to Mrs. Rosemarie Ax daughter of Mrs. Soffner, she reports that they will call back to try and schedule an office visit.

## 2019-06-15 NOTE — Telephone Encounter (Signed)
Pt have called back again about her UTI . Pleas advise pt.

## 2019-06-15 NOTE — Telephone Encounter (Signed)
Spoke to mrs. [name] daughter of [name]. Mrs. [name] reports that they can not bring [name] to an office visit today. I advise if symptoms get worse to go to Urgent Care or ER.

## 2019-06-15 NOTE — Telephone Encounter (Signed)
Please schedule [name] for a virtual visit this afternoon.

## 2019-06-15 NOTE — Progress Notes (Signed)
Telemedicine Encounter: Disclosed to patient at start of encounter that we will provide appropriate telemedicine services.  Patient consents to be treated via phone prior to discussion. - Patient is at her home and is accessed via telephone. - Services are provided by Cassell Smiles from [facility].  Subjective:    Patient ID: [name], female    DOB: 1925-06-01, 85 y.o.   MRN: [id number]  [name] is a 85 y.o. female presenting on 06/15/2019 for Urinary Tract Infection (patient reports painful urination and burning since yesterday. Patient denies fever, abdominal pain or vaginal discharge. )  HPI Dysuria Patient is being seen for dysuria x last 1-2 days.  Patient notes pain only where urine exits.  Patient has mildly increased frequency.  Patient states now is not using the bathroom as much as she should - referring to perceived volumes excreted.   Patient is drinking liquids normally. - Patient also has mild nausea, but this is not unusual for this patient. - Patient denies any bladder pressure, back pain, side pain. - Vaginal burning, irritation is present even at rest / not while urinating.   Social History   Tobacco Use  . Smoking status: Never Smoker  . Smokeless tobacco: Never Used  Substance Use Topics  . Alcohol use: No  . Drug use: No    Review of Systems Per HPI unless specifically indicated above     Objective:    There were no vitals taken for this visit.  Wt Readings from Last 3 Encounters:  11/30/18 122 lb 9.6 oz (55.6 kg)  11/11/18 117 lb (53.1 kg)  10/20/18 121 lb 3.2 oz (55 kg)    Physical Exam Patient remotely monitored.  Verbal communication appropriate.  Cognition normal.  Results for orders placed or performed during the hospital encounter of 11/11/18  Urine Culture   Specimen: Urine, Random  Result Value Ref Range   Specimen Description URINE, RANDOM    Special Requests      NONE Performed at [facility], [address] [place], [place] [address]    Culture 20,000 COLONIES/mL STAPHYLOCOCCUS EPIDERMIDIS (A)    Report Status 11/14/2018 FINAL    Organism ID, Bacteria STAPHYLOCOCCUS EPIDERMIDIS (A)       Susceptibility   Staphylococcus epidermidis - MIC*    CIPROFLOXACIN <=0.5 SENSITIVE Sensitive     GENTAMICIN <=0.5 SENSITIVE Sensitive     NITROFURANTOIN <=16 SENSITIVE Sensitive     OXACILLIN >=4 RESISTANT Resistant     TETRACYCLINE 2 SENSITIVE Sensitive     VANCOMYCIN 2 SENSITIVE Sensitive     TRIMETH/SULFA 160 RESISTANT Resistant     CLINDAMYCIN >=8 RESISTANT Resistant     RIFAMPIN <=0.5 SENSITIVE Sensitive     Inducible Clindamycin NEGATIVE Sensitive     * 20,000 COLONIES/mL STAPHYLOCOCCUS EPIDERMIDIS  CBC  Result Value Ref Range   WBC 7.5 4.0 - 10.5 K/uL   RBC 4.57 3.87 - 5.11 MIL/uL   Hemoglobin 12.4 12.0 - 15.0 g/dL   HCT 38.2 36.0 - 46.0 %   MCV 83.6 80.0 - 100.0 fL   MCH 27.1 26.0 - 34.0 pg   MCHC 32.5 30.0 - 36.0 g/dL   RDW 14.3 11.5 - 15.5 %   Platelets 215 150 - 400 K/uL   nRBC 0.0 0.0 - 0.2 %  Comprehensive metabolic panel  Result Value Ref Range   Sodium 135 135 - 145 mmol/L   Potassium 3.7 3.5 - 5.1 mmol/L   Chloride  103 98 - 111 mmol/L   CO2 26 22 - 32 mmol/L   Glucose, Bld 101 (H) 70 - 99 mg/dL   BUN 9 8 - 23 mg/dL   Creatinine, Ser 0.49 0.44 - 1.00 mg/dL   Calcium 9.8 8.9 - 10.3 mg/dL   Total Protein 7.3 6.5 - 8.1 g/dL   Albumin 4.4 3.5 - 5.0 g/dL   AST 24 15 - 41 U/L   ALT 15 0 - 44 U/L   Alkaline Phosphatase 89 38 - 126 U/L   Total Bilirubin 0.9 0.3 - 1.2 mg/dL   GFR calc non Af Amer >60 >60 mL/min   GFR calc Af Amer >60 >60 mL/min   Anion gap 6 5 - 15  Lipase, blood  Result Value Ref Range   Lipase 32 11 - 51 U/L  Urinalysis, Complete w Microscopic  Result Value Ref Range   Color, Urine STRAW (A) YELLOW   APPearance HAZY (A) CLEAR   Specific Gravity, Urine 1.003 (L) 1.005 - 1.030   pH 9.0 (H) 5.0 - 8.0   Glucose, UA NEGATIVE NEGATIVE  mg/dL   Hgb urine dipstick MODERATE (A) NEGATIVE   Bilirubin Urine NEGATIVE NEGATIVE   Ketones, ur NEGATIVE NEGATIVE mg/dL   Protein, ur NEGATIVE NEGATIVE mg/dL   Nitrite NEGATIVE NEGATIVE   Leukocytes, UA NEGATIVE NEGATIVE   RBC / HPF 11-20 0 - 5 RBC/hpf   WBC, UA 0-5 0 - 5 WBC/hpf   Bacteria, UA RARE (A) NONE SEEN   Squamous Epithelial / LPF 0-5 0 - 5   Budding Yeast PRESENT       Assessment & Plan:   Problem List Items Addressed This Visit    None    Visit Diagnoses    Dysuria    -  Primary   Nausea       Relevant Medications   ondansetron (ZOFRAN) 4 MG tablet    Presumed acute cystitis due to symptoms.  Cannot fully exclude vaginal candidiasis vs atrophic vaginitis as causes for dysuria given irritation at rest without urination.  Pt symptomatic currently with dysuria x 1-2 days. Currently without systemic signs or symptoms of infection.   - No current risk of concurrent STI.  Plan: 1. START Keflex 500mg  3 times daily for next 5 days.   - Requested in person office visit, but patient was unable to attend in clinic due to having no transportation.  CCM referral for Social Work previously submitted to help address these problems.  Patient needs to best be evaluated in person.  Patient verbalizes understanding to current treatment limitations and agrees to come in person to clinic if symptoms persist after treatment. 2. Provided non-pharm measures for UTI prevention for good hygiene. 3. Drink plenty of fluids and improve hydration over next 1 week. 4. Provided precautions for severe symptoms requiring ED visit to include no urine in 24-48 hours. 5. Followup 2-5 days as needed for worsening or persistent symptoms.  Meds ordered this encounter  Medications  . ondansetron (ZOFRAN) 4 MG tablet    Sig: Take 1 tablet (4 mg total) by mouth every 8 (eight) hours as needed for nausea.    Dispense:  6 tablet    Refill:  0    Order Specific Question:   Supervising Provider    Answer:    Olin Hauser [[id number]]  . cephALEXin (KEFLEX) 500 MG capsule    Sig: Take 1 capsule (500 mg total) by mouth 3 (three) times daily for 5 days.  Dispense:  15 capsule    Refill:  0    Order Specific Question:   Supervising Provider    Answer:   Olin Hauser [[id number]]   - Time spent in direct consultation with patient via telemedicine about above concerns: 10 minutes  Follow up plan: Return in about 4 weeks (around 07/13/2019) for dysuria (obtain UA/culture to check for resolution), hypertension, GERD.  Cassell Smiles, DNP, AGPCNP-BC Adult Gerontology Primary Care Nurse Practitioner [facility] 06/15/2019, 4:16 PM

## 2019-06-19 ENCOUNTER — Telehealth: Payer: Self-pay | Admitting: Nurse Practitioner

## 2019-06-19 NOTE — Telephone Encounter (Deleted)
The pt will be willing to come in tomorrow, but you don't have any appt available.

## 2019-06-19 NOTE — Telephone Encounter (Signed)
Pt called said that her UTI was not getting better, now she have some sinus, sores in nose please advise  Pt. Pt call back # is  [phone]

## 2019-06-19 NOTE — Telephone Encounter (Signed)
From [place], CMA: She is availabe to come in tomorrow, but you don't have anything available. She said she cannot wait until Thursday because she is so sick.  No ability to work in on [employer].  Patient called and discussed need to see her.  Reminded patient of our agreement from telephone visit Friday that in-person visit is required.  She verbalizes understanding. - Also discussed inappropriate telephone behaviors with office staff to include crying, screaming, Making statements like I'm dying, I have to see Anjelina Dung today.  Discussed if behaviors continue, we may need to discuss termination of this patient provider relationship due to repeated unsafe requests, not following medical advice.  Reassured patient that we want to take care of her medical needs, but only through the proper channels and appointments in person as required.  Appointment scheduled for 8/10 at 4pm

## 2019-06-21 ENCOUNTER — Ambulatory Visit: Payer: Medicare Other | Admitting: Nurse Practitioner

## 2019-06-22 ENCOUNTER — Encounter: Payer: Self-pay | Admitting: Nurse Practitioner

## 2019-06-22 ENCOUNTER — Other Ambulatory Visit: Payer: Self-pay

## 2019-06-22 ENCOUNTER — Ambulatory Visit (INDEPENDENT_AMBULATORY_CARE_PROVIDER_SITE_OTHER): Payer: Medicare Other | Admitting: Family Medicine

## 2019-06-22 ENCOUNTER — Encounter: Payer: Self-pay | Admitting: Family Medicine

## 2019-06-22 VITALS — BP 116/70 | HR 96 | Temp 98.5°F

## 2019-06-22 DIAGNOSIS — R590 Localized enlarged lymph nodes: Secondary | ICD-10-CM | POA: Diagnosis not present

## 2019-06-22 DIAGNOSIS — Z87898 Personal history of other specified conditions: Secondary | ICD-10-CM

## 2019-06-22 DIAGNOSIS — F411 Generalized anxiety disorder: Secondary | ICD-10-CM

## 2019-06-22 DIAGNOSIS — R300 Dysuria: Secondary | ICD-10-CM | POA: Diagnosis not present

## 2019-06-22 NOTE — Progress Notes (Signed)
Subjective:    Patient ID: [name], female    DOB: 01-Sep-1925, 85 y.o.   MRN: [id number]  [name] is a 85 y.o. female presenting on 06/22/2019 for Anxiety (have to call EMS x 4 in past 3 weeks), Constipation, Dysuria, and Cyst (Left side of her neck onset 2 days ago)   HPI   Left side of neck, Cyst Reported it as new problem. Chart review shows same problem documented cervical lymphadenopathy L side in same area dating back to 07/07/18 with prior PCP, US neck was ordered but never completed. - Today now reports pain on this spot for past 2 days, reduced appetite, unable to give time frame.  Anxiety EMS x 4 in past 3 weeks Patient was anxious, thought heart attack She is asking about add extra ativan 0.5mg  - has rx at home, on last 1 month, normally PCP rx it for 3 times daily with good results  Follow-up UTI Still has some intermittent burning dysuria Taking Flomax, less urinary retention Last treated about 1 week ago for UTI empirically on remote visit  Constipation History of opiates On miralax most days, misses some days, not taking daily  Chronic sinusitis Followed by [facility] - Per Dr Pryor Ochoa Angus Seller but has not been able to find this. - Admits chronic ear pain   Depression screen [facility] 2/9 04/06/2019 01/19/2019 07/07/2018  Decreased Interest 0 0 0  Down, Depressed, Hopeless 0 0 0  PHQ - 2 Score 0 0 0  Altered sleeping 0 0 -  Tired, decreased energy 0 1 -  Change in appetite 0 0 -  Feeling bad or failure about yourself  0 0 -  Trouble concentrating 0 0 -  Moving slowly or fidgety/restless 0 0 -  Suicidal thoughts 0 0 -  PHQ-9 Score 0 1 -  Difficult doing work/chores Not difficult at all Not difficult at all -    Social History   Tobacco Use  . Smoking status: Never Smoker  . Smokeless tobacco: Never Used  Substance Use Topics  . Alcohol use: No  . Drug use: No    Review of Systems Per HPI unless specifically indicated above      Objective:    BP 116/70   Pulse 96   Temp 98.5 F (36.9 C) (Oral)   Wt Readings from Last 3 Encounters:  06/24/19 115 lb (52.2 kg)  11/30/18 122 lb 9.6 oz (55.6 kg)  11/11/18 117 lb (53.1 kg)    Physical Exam Vitals signs and nursing note reviewed.  Constitutional:      General: She is not in acute distress.    Appearance: She is well-developed. She is not diaphoretic.     Comments: Chronically ill appearing, in wheelchair  HENT:     Head: Normocephalic and atraumatic.     Comments: Hard of hearing    Right Ear: Tympanic membrane normal.     Left Ear: Tympanic membrane normal.     Ears:     Comments: Narrow ear canals Eyes:     General:        Right eye: No discharge.        Left eye: No discharge.     Conjunctiva/sclera: Conjunctivae normal.  Neck:     Comments: Localized tender nodular density L side anterior cervical, question lymphadenopathy vs other palpable mass Cardiovascular:     Rate and Rhythm: Normal rate.  Pulmonary:     Effort: Pulmonary effort is normal.  Skin:    General: Skin is warm and dry.     Findings: No erythema or rash.  Neurological:     Mental Status: She is alert and oriented to person, place, and time.  Psychiatric:        Behavior: Behavior normal.     Comments: Well groomed, good eye contact, normal speech and thoughts    Results for orders placed or performed during the hospital encounter of 11/11/18  Urine Culture   Specimen: Urine, Random  Result Value Ref Range   Specimen Description URINE, RANDOM    Special Requests      NONE Performed at [facility], [address] [place], [place] [address]    Culture 20,000 COLONIES/mL STAPHYLOCOCCUS EPIDERMIDIS (A)    Report Status 11/14/2018 FINAL    Organism ID, Bacteria STAPHYLOCOCCUS EPIDERMIDIS (A)       Susceptibility   Staphylococcus epidermidis - MIC*    CIPROFLOXACIN <=0.5 SENSITIVE Sensitive     GENTAMICIN <=0.5 SENSITIVE Sensitive     NITROFURANTOIN <=16  SENSITIVE Sensitive     OXACILLIN >=4 RESISTANT Resistant     TETRACYCLINE 2 SENSITIVE Sensitive     VANCOMYCIN 2 SENSITIVE Sensitive     TRIMETH/SULFA 160 RESISTANT Resistant     CLINDAMYCIN >=8 RESISTANT Resistant     RIFAMPIN <=0.5 SENSITIVE Sensitive     Inducible Clindamycin NEGATIVE Sensitive     * 20,000 COLONIES/mL STAPHYLOCOCCUS EPIDERMIDIS  CBC  Result Value Ref Range   WBC 7.5 4.0 - 10.5 K/uL   RBC 4.57 3.87 - 5.11 MIL/uL   Hemoglobin 12.4 12.0 - 15.0 g/dL   HCT 38.2 36.0 - 46.0 %   MCV 83.6 80.0 - 100.0 fL   MCH 27.1 26.0 - 34.0 pg   MCHC 32.5 30.0 - 36.0 g/dL   RDW 14.3 11.5 - 15.5 %   Platelets 215 150 - 400 K/uL   nRBC 0.0 0.0 - 0.2 %  Comprehensive metabolic panel  Result Value Ref Range   Sodium 135 135 - 145 mmol/L   Potassium 3.7 3.5 - 5.1 mmol/L   Chloride 103 98 - 111 mmol/L   CO2 26 22 - 32 mmol/L   Glucose, Bld 101 (H) 70 - 99 mg/dL   BUN 9 8 - 23 mg/dL   Creatinine, Ser 0.49 0.44 - 1.00 mg/dL   Calcium 9.8 8.9 - 10.3 mg/dL   Total Protein 7.3 6.5 - 8.1 g/dL   Albumin 4.4 3.5 - 5.0 g/dL   AST 24 15 - 41 U/L   ALT 15 0 - 44 U/L   Alkaline Phosphatase 89 38 - 126 U/L   Total Bilirubin 0.9 0.3 - 1.2 mg/dL   GFR calc non Af Amer >60 >60 mL/min   GFR calc Af Amer >60 >60 mL/min   Anion gap 6 5 - 15  Lipase, blood  Result Value Ref Range   Lipase 32 11 - 51 U/L  Urinalysis, Complete w Microscopic  Result Value Ref Range   Color, Urine STRAW (A) YELLOW   APPearance HAZY (A) CLEAR   Specific Gravity, Urine 1.003 (L) 1.005 - 1.030   pH 9.0 (H) 5.0 - 8.0   Glucose, UA NEGATIVE NEGATIVE mg/dL   Hgb urine dipstick MODERATE (A) NEGATIVE   Bilirubin Urine NEGATIVE NEGATIVE   Ketones, ur NEGATIVE NEGATIVE mg/dL   Protein, ur NEGATIVE NEGATIVE mg/dL   Nitrite NEGATIVE NEGATIVE   Leukocytes, UA NEGATIVE NEGATIVE   RBC / HPF 11-20 0 - 5  RBC/hpf   WBC, UA 0-5 0 - 5 WBC/hpf   Bacteria, UA RARE (A) NONE SEEN   Squamous Epithelial / LPF 0-5 0 - 5   Budding  Yeast PRESENT       Assessment & Plan:   Problem List Items Addressed This Visit    History of urinary retention    Other Visit Diagnoses    Dysuria    -  Primary   Relevant Orders   Urine Culture   Left cervical lymphadenopathy          #Dysuria - Check UA, results negative, awaiting enter results into chart by CMA - Order Urine culture, f/u results Hold repeat antibiotic course  #LAD Seems to be acutely painful but by chart review chronic problem >1 year. Last [place] never completed. - Will recommend to PCP to order repeat Neck Soft Tissue US to investigate, check to determine if evaluated by ENT in past as well. - Ultimately will need to return to [facility] ENT most likely  #Anxiety On chronic ativan 0.5 TID PRN Given recent changes in anxiety, discussed possibility of may warrant additional dose PRN. Advised that I would not initiate any new BDZ rx at this time, she would need to discuss with her PCP at follow-up to determine proper course with this controlled med, reviewed risks of medication. - She may benefit from x 4 daily however.  No orders of the defined types were placed in this encounter.     Follow up plan: Return in about 3 weeks (around 07/13/2019) for Follow-up 3 weeks with Lauren for med refill anxiety.   [name], [facility] 06/22/2019, 3:45 PM

## 2019-06-22 NOTE — Patient Instructions (Addendum)
Thank you for coming to the office today.  We will send urine culture - stay tuned for results. No sign of UTI infection today.  If not improved - can return to Dr Bernardo Heater - ask for a daily preventative prophylactic antibiotic dose  Per ENT - Dr Pryor Ochoa - can use the [facility] - for sinuses, check [facility] or [facility], or [employer] (non rx)  May use an extra dose of Ativan for agitation / anxiety if needed - please come in sooner to see Lauren before refill is due to authorize that rx if she agrees.  We will order a Neck Soft Tissue/Thyroid Ultrasound - stay tuned for appointment, once scheduled.  May follow back with Dr Pryor Ochoa based on imaging results.   Please schedule a Follow-up Appointment to: Return in about 3 weeks (around 07/13/2019) for Follow-up 3 weeks with Lauren for med refill anxiety.  If you have any other questions or concerns, please feel free to call the office or send a message through [place]. You may also schedule an earlier appointment if necessary.  Additionally, you may be receiving a survey about your experience at our office within a few days to 1 week by e-mail or mail. We value your feedback.  [name], DO [place]

## 2019-06-24 ENCOUNTER — Emergency Department: Payer: Medicare Other

## 2019-06-24 ENCOUNTER — Other Ambulatory Visit: Payer: Self-pay

## 2019-06-24 ENCOUNTER — Encounter: Payer: Self-pay | Admitting: Emergency Medicine

## 2019-06-24 ENCOUNTER — Emergency Department
Admission: EM | Admit: 2019-06-24 | Discharge: 2019-06-25 | Disposition: A | Discharge status: Home / Self Care | Payer: Medicare Other | Attending: Emergency Medicine | Admitting: Emergency Medicine

## 2019-06-24 DIAGNOSIS — M5030 Other cervical disc degeneration, unspecified cervical region: Secondary | ICD-10-CM | POA: Diagnosis not present

## 2019-06-24 DIAGNOSIS — Y998 Other external cause status: Secondary | ICD-10-CM | POA: Insufficient documentation

## 2019-06-24 DIAGNOSIS — Y9389 Activity, other specified: Secondary | ICD-10-CM | POA: Diagnosis not present

## 2019-06-24 DIAGNOSIS — S134XXA Sprain of ligaments of cervical spine, initial encounter: Secondary | ICD-10-CM | POA: Diagnosis not present

## 2019-06-24 DIAGNOSIS — Z79899 Other long term (current) drug therapy: Secondary | ICD-10-CM | POA: Diagnosis not present

## 2019-06-24 DIAGNOSIS — S199XXA Unspecified injury of neck, initial encounter: Secondary | ICD-10-CM | POA: Insufficient documentation

## 2019-06-24 DIAGNOSIS — Y9289 Other specified places as the place of occurrence of the external cause: Secondary | ICD-10-CM | POA: Diagnosis not present

## 2019-06-24 DIAGNOSIS — R937 Abnormal findings on diagnostic imaging of other parts of musculoskeletal system: Secondary | ICD-10-CM | POA: Diagnosis not present

## 2019-06-24 DIAGNOSIS — E079 Disorder of thyroid, unspecified: Secondary | ICD-10-CM | POA: Insufficient documentation

## 2019-06-24 DIAGNOSIS — M4802 Spinal stenosis, cervical region: Secondary | ICD-10-CM | POA: Diagnosis not present

## 2019-06-24 DIAGNOSIS — X500XXA Overexertion from strenuous movement or load, initial encounter: Secondary | ICD-10-CM | POA: Insufficient documentation

## 2019-06-24 DIAGNOSIS — I6782 Cerebral ischemia: Secondary | ICD-10-CM | POA: Diagnosis not present

## 2019-06-24 DIAGNOSIS — S139XXA Sprain of joints and ligaments of unspecified parts of neck, initial encounter: Secondary | ICD-10-CM

## 2019-06-24 DIAGNOSIS — R9389 Abnormal findings on diagnostic imaging of other specified body structures: Secondary | ICD-10-CM | POA: Diagnosis not present

## 2019-06-24 DIAGNOSIS — E041 Nontoxic single thyroid nodule: Secondary | ICD-10-CM | POA: Diagnosis not present

## 2019-06-24 DIAGNOSIS — M542 Cervicalgia: Secondary | ICD-10-CM | POA: Diagnosis not present

## 2019-06-24 DIAGNOSIS — I10 Essential (primary) hypertension: Secondary | ICD-10-CM | POA: Diagnosis not present

## 2019-06-24 DIAGNOSIS — R0902 Hypoxemia: Secondary | ICD-10-CM | POA: Diagnosis not present

## 2019-06-24 DIAGNOSIS — R52 Pain, unspecified: Secondary | ICD-10-CM | POA: Diagnosis not present

## 2019-06-24 DIAGNOSIS — M47812 Spondylosis without myelopathy or radiculopathy, cervical region: Secondary | ICD-10-CM | POA: Diagnosis not present

## 2019-06-24 LAB — COMPREHENSIVE METABOLIC PANEL
ALT: 18 U/L (ref 0–44)
AST: 33 U/L (ref 15–41)
Albumin: 3.9 g/dL (ref 3.5–5.0)
Alkaline Phosphatase: 79 U/L (ref 38–126)
Anion gap: 7 (ref 5–15)
BUN: 9 mg/dL (ref 8–23)
CO2: 25 mmol/L (ref 22–32)
Calcium: 10 mg/dL (ref 8.9–10.3)
Chloride: 99 mmol/L (ref 98–111)
Creatinine, Ser: 0.5 mg/dL (ref 0.44–1.00)
GFR calc Af Amer: >60 mL/min (ref >60)
GFR calc non Af Amer: >60 mL/min (ref >60)
Glucose, Bld: 108 mg/dL — ABNORMAL HIGH (ref 70–99)
Potassium: 4.1 mmol/L (ref 3.5–5.1)
Sodium: 131 mmol/L — ABNORMAL LOW (ref 135–145)
Total Bilirubin: 0.7 mg/dL (ref 0.3–1.2)
Total Protein: 6.8 g/dL (ref 6.5–8.1)

## 2019-06-24 LAB — CBC WITH DIFFERENTIAL/PLATELET
Abs Immature Granulocytes: 0.02 10*3/uL (ref 0.00–0.07)
Basophils Absolute: 0.1 10*3/uL (ref 0.0–0.1)
Basophils Relative: 1 %
Eosinophils Absolute: 0.1 10*3/uL (ref 0.0–0.5)
Eosinophils Relative: 2 %
HCT: 36.9 % (ref 36.0–46.0)
Hemoglobin: 12.2 g/dL (ref 12.0–15.0)
Immature Granulocytes: 0 %
Lymphocytes Relative: 36 %
Lymphs Abs: 2.4 10*3/uL (ref 0.7–4.0)
MCH: 26.3 pg (ref 26.0–34.0)
MCHC: 33.1 g/dL (ref 30.0–36.0)
MCV: 79.7 fL — ABNORMAL LOW (ref 80.0–100.0)
Monocytes Absolute: 1 10*3/uL (ref 0.1–1.0)
Monocytes Relative: 15 %
Neutro Abs: 3.1 10*3/uL (ref 1.7–7.7)
Neutrophils Relative %: 46 %
Platelets: 261 10*3/uL (ref 150–400)
RBC: 4.63 MIL/uL (ref 3.87–5.11)
RDW: 13.6 % (ref 11.5–15.5)
WBC: 6.6 10*3/uL (ref 4.0–10.5)
nRBC: 0 % (ref 0.0–0.2)

## 2019-06-24 LAB — URINALYSIS, COMPLETE (UACMP) WITH MICROSCOPIC
Bacteria, UA: NONE SEEN
Bilirubin Urine: NEGATIVE
Glucose, UA: NEGATIVE mg/dL
Ketones, ur: NEGATIVE mg/dL
Leukocytes,Ua: NEGATIVE
Nitrite: NEGATIVE
Protein, ur: NEGATIVE mg/dL
Specific Gravity, Urine: 1.005 (ref 1.005–1.030)
pH: 8 (ref 5.0–8.0)

## 2019-06-24 LAB — TSH: TSH: 1.279 u[IU]/mL (ref 0.350–4.500)

## 2019-06-24 LAB — T4, FREE: Free T4: 1.02 ng/dL (ref 0.61–1.12)

## 2019-06-24 MED ORDER — DICLOFENAC SODIUM 1 % TD GEL
4.0000 g | Freq: Four times a day (QID) | TRANSDERMAL | Status: DC
  Administered 2019-06-25: 01:00:00 4 g via TOPICAL
  Filled 2019-06-24: qty 100

## 2019-06-24 MED ORDER — KETOROLAC TROMETHAMINE 30 MG/ML IJ SOLN
15.0000 mg | Freq: Once | INTRAMUSCULAR | Status: DC
  Filled 2019-06-24: qty 1

## 2019-06-24 MED ORDER — FENTANYL CITRATE (PF) 100 MCG/2ML IJ SOLN
25.0000 ug | Freq: Once | INTRAMUSCULAR | Status: DC
  Filled 2019-06-24: qty 2

## 2019-06-24 MED ORDER — HYDROCORTISONE 1 % EX OINT
TOPICAL_OINTMENT | Freq: Two times a day (BID) | CUTANEOUS | Status: DC
  Administered 2019-06-25: 01:00:00 via TOPICAL
  Filled 2019-06-24: qty 28.35

## 2019-06-24 MED ORDER — HYDROCODONE-ACETAMINOPHEN 5-325 MG PO TABS
2.0000 | ORAL_TABLET | Freq: Once | ORAL | Status: AC
  Administered 2019-06-24: 23:00:00 2 via ORAL
  Filled 2019-06-24: qty 2

## 2019-06-24 MED ORDER — PHENAZOPYRIDINE HCL 100 MG PO TABS
95.0000 mg | ORAL_TABLET | Freq: Once | ORAL | Status: AC
  Administered 2019-06-25: 100 mg via ORAL
  Filled 2019-06-24: qty 1

## 2019-06-24 NOTE — ED Triage Notes (Signed)
Patient presents to Emergency Department via [facility] from home with complaints of left base of neck pain.    Per EMS pt bent over to pick up clothes on THursday and felt a pop in the neck, within the last 12 hours pain has become very bad with radiation into the back and pelvis.  No trauma.     History of back surgery, and pt is quite deaf without hearing aids.

## 2019-06-24 NOTE — Discharge Instructions (Addendum)
For your neck pain, we have given you Voltaren gel.  This is a pain gel that can be applied to the neck 3-4 times a day.  Using your hand, apply this liberally to the area of pain.  Continue taking your hydrocodone.  I suspect this is from a slipped disc in your neck and you should call your doctor to set up an appointment for follow-up.  Your CT scan also showed a thyroid mass.  This is likely the cause of the swelling in your neck.  I do not think this is causing your pain.  However, it is very important that you call your doctor to set up a thyroid ultrasound and follow-up for this, as it could represent cancer or growth.  You should call tomorrow.  Your burning in the vaginal area is likely due to an allergic reaction to the catheter.  We have put a cream in the area.  You can apply topical hydrocortisone cream to help with itching and burning.  This should resolve over the next 24 hours.

## 2019-06-24 NOTE — ED Notes (Signed)
Responded to patient call bell. Pt stated that the purewick burned and she had to pull it off. She was very upset about the burning sensation and wanted to speak to the doctor about something for her bladder. Dr Ellender Hose notified.

## 2019-06-24 NOTE — ED Provider Notes (Signed)
[facility] Emergency Department Provider Note  ____________________________________________   First MD Initiated Contact with Patient 06/24/19 2127     (approximate)  I have reviewed the triage vital signs and the nursing notes.   HISTORY  Chief Complaint Neck Injury    HPI [name] is a 85 y.o. female  Here with left neck pain. Pt reporst that she was bending over two days ago when she felt like something pulled in her left neck. Since then, she's had significant left paraspinal neck pain which she describes as an aching, throbbing, severe pain of her neck that is worse w/ any movement or palpation. She's had associated radiation of the pain toward her shoulder and down her back. No injuries. No UE or LE weakness or numbness. She also incidentally reports that she has had swelling of her anterior neck for months. She has talked to her PCP but states she has not had any imaging. This area of swelling is along her anterior neck and is not tender. No warmth or redness. No other complaints.        Past Medical History:  Diagnosis Date   Allergy    environmental   Anxiety    Chronic back pain    High cholesterol    History of urinary retention    Hypertension    Migraines    Osteoporosis    Pneumonia    Sinusitis     Patient Active Problem List   Diagnosis Date Noted   Macular degeneration 09/04/2018   Cervical lymphadenopathy 07/18/2018   Pain in right hip 07/18/2018   Lymphadenopathy 06/25/2018   Stomatitis and mucositis 06/25/2018   Shortness of breath 02/16/2018   Arm fracture, left 01/03/2018   Arthritis 01/03/2018   Compression fracture of thoracic vertebra ([place]) 01/03/2018   Emphysema lung ([place]) 01/03/2018   GERD (gastroesophageal reflux disease) 01/03/2018   GI bleeding 01/03/2018   Lumbar spondylosis 01/03/2018   Menstrual problem 01/03/2018   Osteoarthritis of knee 01/03/2018   Osteoporosis  01/03/2018   Acute pain of right knee 01/03/2018   Synovial cyst of popliteal space 01/03/2018   History of urinary retention 11/10/2017   Generalized anxiety disorder 10/24/2017   Constipation 10/24/2017   Internal hemorrhoids 10/24/2017   Essential (primary) hypertension 10/24/2017   Thrombocytopenia ([place]) 10/24/2017   Acute encephalopathy 08/04/2017   Hyponatremia 12/15/2015    Past Surgical History:  Procedure Laterality Date   ABDOMINAL HYSTERECTOMY     APPENDECTOMY     BACK SURGERY     BACK SURGERY  2008   2009   BREAST BIOPSY Right 2002   neg   HEMORRHOID SURGERY     MOUTH SURGERY  2019   TONSILLECTOMY      Prior to Admission medications   Medication Sig Start Date End Date Taking? Authorizing Provider  amLODipine (NORVASC) 5 MG tablet Take 1 tablet (5 mg total) by mouth daily. 01/19/19   Mikey College, NP  carvedilol (COREG) 6.25 MG tablet Take 1 tablet (6.25 mg total) by mouth 2 (two) times daily with a meal. 02/15/19   Karamalegos, [name], DO  furosemide (LASIX) 20 MG tablet Take 1 tablet po QD prn 01/19/19   Mikey College, NP  HYDROcodone-acetaminophen (NORCO) 10-325 MG tablet Take 1 tablet by mouth every 4 (four) hours as needed for moderate pain.     [provider]  LORazepam (ATIVAN) 0.5 MG tablet Take 1 tablet (0.5 mg total) by mouth 3 (three)  times daily as needed for anxiety. 01/19/19   Mikey College, NP  mirtazapine (REMERON) 30 MG tablet Take 1 tablet (30 mg total) by mouth at bedtime. 01/19/19   Mikey College, NP  ondansetron (ZOFRAN) 4 MG tablet Take 1 tablet (4 mg total) by mouth every 8 (eight) hours as needed for nausea. 06/15/19   Mikey College, NP  phenazopyridine (PYRIDIUM) 95 MG tablet Take 95 mg by mouth 3 (three) times daily as needed for pain.    [provider]  tamsulosin (FLOMAX) 0.4 MG CAPS capsule Take 1 capsule (0.4 mg total) by mouth daily. 06/01/19   Zara Council A,  PA-C    Allergies Ceftin [cefuroxime], Cortisone, Fluticasone, Levofloxacin, Meperidine, Morphine and related, Morphine sulfate, Nisoldipine, Penicillin g potassium in d5w, Prednisone, and Sulfa antibiotics  Family History  Problem Relation Age of Onset   Breast cancer Sister 68   Breast cancer Maternal Grandmother 60   Breast cancer Sister 52   Bladder Cancer Brother    Hypertension Neg Hx    Kidney cancer Neg Hx     Social History Social History   Tobacco Use   Smoking status: Never Smoker   Smokeless tobacco: Never Used  Substance Use Topics   Alcohol use: No   Drug use: No    Review of Systems  Review of Systems  Constitutional: Negative for fatigue and fever.  HENT: Negative for congestion and sore throat.   Eyes: Negative for visual disturbance.  Respiratory: Negative for cough and shortness of breath.   Cardiovascular: Negative for chest pain.  Gastrointestinal: Negative for abdominal pain, diarrhea, nausea and vomiting.  Genitourinary: Negative for flank pain.  Musculoskeletal: Positive for neck pain and neck stiffness. Negative for back pain.  Skin: Negative for rash and wound.  Neurological: Negative for weakness.     ____________________________________________  PHYSICAL EXAM:      VITAL SIGNS: ED Triage Vitals  Enc Vitals Group     BP 06/24/19 2132 132/89     Pulse Rate 06/24/19 2132 (!) 103     Resp 06/24/19 2132 20     Temp 06/24/19 2132 98.5 F (36.9 C)     Temp Source 06/24/19 2132 Oral     SpO2 06/24/19 2132 95 %     Weight 06/24/19 2127 115 lb (52.2 kg)     Height 06/24/19 2127 5\' 2"  (1.575 m)     Head Circumference --      Peak Flow --      Pain Score 06/24/19 2127 10     Pain Loc --      Pain Edu? --      Excl. in [place]? --      Physical Exam Vitals signs and nursing note reviewed.  Constitutional:      General: She is not in acute distress.    Appearance: She is well-developed.  HENT:     Head: Normocephalic and  atraumatic.  Eyes:     Conjunctiva/sclera: Conjunctivae normal.  Neck:     Musculoskeletal: Neck supple.     Comments: Moderate TTP along left paraspinal cervical musculature, with reproduction of her neck pain upon palpation. Positive Spurling's on left with tingling to her shoulder. Palpable thyromegaly with nodule versus growth of left thyroid extending to lateral neck. Cardiovascular:     Rate and Rhythm: Normal rate and regular rhythm.     Heart sounds: Normal heart sounds. No murmur. No friction rub.  Pulmonary:     Effort: Pulmonary  effort is normal. No respiratory distress.     Breath sounds: Normal breath sounds. No wheezing or rales.  Abdominal:     General: There is no distension.     Palpations: Abdomen is soft.     Tenderness: There is no abdominal tenderness.  Skin:    General: Skin is warm.     Capillary Refill: Capillary refill takes less than 2 seconds.  Neurological:     Mental Status: She is alert and oriented to person, place, and time.     Motor: No abnormal muscle tone.     Comments: Strength 5/5 bilateral UE including grip strength, wrist flexion/extension. Normal sensation to pinprick and light touch b/l UE and LE. Reflexes 2+ symmetric.       ____________________________________________   LABS (all labs ordered are listed, but only abnormal results are displayed)  Labs Reviewed  CBC WITH DIFFERENTIAL/PLATELET - Abnormal; Notable for the following components:      Result Value   MCV 79.7 (*)    All other components within normal limits  COMPREHENSIVE METABOLIC PANEL - Abnormal; Notable for the following components:   Sodium 131 (*)    Glucose, Bld 108 (*)    All other components within normal limits  URINALYSIS, COMPLETE (UACMP) WITH MICROSCOPIC - Abnormal; Notable for the following components:   Color, Urine STRAW (*)    APPearance CLEAR (*)    Hgb urine dipstick SMALL (*)    All other components within normal limits  TSH  T4, FREE     ____________________________________________  EKG: None ________________________________________  RADIOLOGY All imaging, including plain films, CT scans, and ultrasounds, independently reviewed by me, and interpretations confirmed via formal radiology reads.  ED MD interpretation:   CT Head: NAICA CT C-Spine: Significant degen disease worse C3-C6, large thyroid mass  Official radiology report(s): Ct Head Wo Contrast  Result Date: 06/24/2019 CLINICAL DATA:  Cervical pain with bending EXAM: CT HEAD WITHOUT CONTRAST CT CERVICAL SPINE WITHOUT CONTRAST TECHNIQUE: Multidetector CT imaging of the head and cervical spine was performed following the standard protocol without intravenous contrast. Multiplanar CT image reconstructions of the cervical spine were also generated. COMPARISON:  CTs August 23, 2018, March 22, 2018, MRI September 16, 2000 FINDINGS: CT HEAD FINDINGS Brain: No evidence of acute infarction, hemorrhage, hydrocephalus, extra-axial collection or mass lesion/mass effect. Symmetric prominence of the ventricles, cisterns and sulci compatible with parenchymal volume loss. Patchy areas of white matter hypoattenuation are most compatible with chronic microvascular angiopathy. Vascular: Atherosclerotic calcification of the carotid siphons and intradural vertebral arteries. No hyperdense vessel. Skull: Diffuse osteopenia. No calvarial fracture or suspicious osseous lesion. No scalp swelling or hematoma. Sinuses/Orbits: Paranasal sinuses and mastoid air cells are predominantly clear. Orbital structures are unremarkable aside from prior lens extractions and senescent scleral plaques. Other: None CT CERVICAL SPINE FINDINGS Alignment: Slight degenerative straightening of the normal cervical lordosis. Minimal 2 mm of degenerative anterolisthesis of C5 on C6. No abnormal facet widening. Normal alignment of the craniocervical and atlantoaxial articulations. Skull base and vertebrae: No acute fracture.  No primary bone lesion or focal pathologic process. Diffuse osteopenia and fatty marrow replacement. Soft tissues and spinal canal: No pre or paravertebral fluid or swelling. No visible canal hematoma. Disc levels: Multilevel intervertebral disc height loss with spondylitic endplate changes. Disc osteophyte complexes at C2-3 result in mild canal stenosis. Disc osteophyte complex at C3-4, in combination with posterior ligamentum flavum infolding results in at least moderate canal stenosis. Additional mild disc bulges result in no  significant canal stenosis. Multilevel uncinate spurring is present in combination with facet hypertrophic changes resulting in moderate to severe canal stenosis on the right at C3-4 and moderate stenosis bilaterally at C4-5 and C5-6. Upper chest: No acute abnormality in the upper chest or imaged lung apices. Atherosclerotic calcifications seen in the aortic arch. Additional atherosclerotic calcifications in the cervical carotid arteries. Other: Large 4.5 x 4.2 hypoattenuating mass-like enlargement of the left thyroid lobe with geographic areas of mixed hyper and hypoattenuation. Additional 1.2 cm hypoattenuating nodule in the right lobe thyroid as well. IMPRESSION: 1. No acute intracranial abnormality. 2. Chronic microvascular changes, intracranial atherosclerosis and parenchymal volume loss. 3. No acute cervical spine fracture. Multilevel degenerative changes of the cervical spine as described above, changes maximal from C3-C6. 4. Large 4.5 x 4.2 mass appearing to arise from the left thyroid lobe with geographic areas of mixed hyper and hypoattenuation. Recommend further evaluation with thyroid ultrasound. Electronically Signed   By: Lovena Le M.D.   On: 06/24/2019 22:34   Ct Cervical Spine Wo Contrast  Result Date: 06/24/2019 CLINICAL DATA:  Cervical pain with bending EXAM: CT HEAD WITHOUT CONTRAST CT CERVICAL SPINE WITHOUT CONTRAST TECHNIQUE: Multidetector CT imaging of the head  and cervical spine was performed following the standard protocol without intravenous contrast. Multiplanar CT image reconstructions of the cervical spine were also generated. COMPARISON:  CTs August 23, 2018, March 22, 2018, MRI September 16, 2000 FINDINGS: CT HEAD FINDINGS Brain: No evidence of acute infarction, hemorrhage, hydrocephalus, extra-axial collection or mass lesion/mass effect. Symmetric prominence of the ventricles, cisterns and sulci compatible with parenchymal volume loss. Patchy areas of white matter hypoattenuation are most compatible with chronic microvascular angiopathy. Vascular: Atherosclerotic calcification of the carotid siphons and intradural vertebral arteries. No hyperdense vessel. Skull: Diffuse osteopenia. No calvarial fracture or suspicious osseous lesion. No scalp swelling or hematoma. Sinuses/Orbits: Paranasal sinuses and mastoid air cells are predominantly clear. Orbital structures are unremarkable aside from prior lens extractions and senescent scleral plaques. Other: None CT CERVICAL SPINE FINDINGS Alignment: Slight degenerative straightening of the normal cervical lordosis. Minimal 2 mm of degenerative anterolisthesis of C5 on C6. No abnormal facet widening. Normal alignment of the craniocervical and atlantoaxial articulations. Skull base and vertebrae: No acute fracture. No primary bone lesion or focal pathologic process. Diffuse osteopenia and fatty marrow replacement. Soft tissues and spinal canal: No pre or paravertebral fluid or swelling. No visible canal hematoma. Disc levels: Multilevel intervertebral disc height loss with spondylitic endplate changes. Disc osteophyte complexes at C2-3 result in mild canal stenosis. Disc osteophyte complex at C3-4, in combination with posterior ligamentum flavum infolding results in at least moderate canal stenosis. Additional mild disc bulges result in no significant canal stenosis. Multilevel uncinate spurring is present in combination with  facet hypertrophic changes resulting in moderate to severe canal stenosis on the right at C3-4 and moderate stenosis bilaterally at C4-5 and C5-6. Upper chest: No acute abnormality in the upper chest or imaged lung apices. Atherosclerotic calcifications seen in the aortic arch. Additional atherosclerotic calcifications in the cervical carotid arteries. Other: Large 4.5 x 4.2 hypoattenuating mass-like enlargement of the left thyroid lobe with geographic areas of mixed hyper and hypoattenuation. Additional 1.2 cm hypoattenuating nodule in the right lobe thyroid as well. IMPRESSION: 1. No acute intracranial abnormality. 2. Chronic microvascular changes, intracranial atherosclerosis and parenchymal volume loss. 3. No acute cervical spine fracture. Multilevel degenerative changes of the cervical spine as described above, changes maximal from C3-C6. 4. Large 4.5 x 4.2 mass  appearing to arise from the left thyroid lobe with geographic areas of mixed hyper and hypoattenuation. Recommend further evaluation with thyroid ultrasound. Electronically Signed   By: Lovena Le M.D.   On: 06/24/2019 22:34    ____________________________________________  PROCEDURES   Procedure(s) performed (including Critical Care):  Procedures  ____________________________________________  INITIAL IMPRESSION / MDM / [place] / ED COURSE  As part of my medical decision making, I reviewed the following data within the electronic MEDICAL RECORD NUMBER Notes from prior ED visits and [place] Controlled Substance Database      *[name] was evaluated in Emergency Department on 06/25/2019 for the symptoms described in the history of present illness. She was evaluated in the context of the global COVID-19 pandemic, which necessitated consideration that the patient might be at risk for infection with the SARS-CoV-2 virus that causes COVID-19. Institutional protocols and algorithms that pertain to the evaluation of patients at  risk for COVID-19 are in a state of rapid change based on information released by regulatory bodies including the CDC and federal and state organizations. These policies and algorithms were followed during the patient's care in the ED.  Some ED evaluations and interventions may be delayed as a result of limited staffing during the pandemic.*      Medical Decision Making: 85 yo F with PMhx as above, including chronic LBP 2/2 DDD, here with neck pain after bending down. Exam, history is most consistent with cervical neck strain, likely 2/2 degenerative joint disease, with possible component of degenerative disc disease as well. Diffuse disease noted on CT scan and sx are positional, reproducible on exam. She has no TTP over anterior neck/carotid and no neurological deficits; do not suspect dissection, carotid aneurysm, or vascular etiology. Pain is improved w/ norco and voltaren here. Given that she is already on Hydrocodone, which is high risk given her age, will avoid additional narcotics and rx voltaren for topical treatment, with outpt follow-up.  Incidentally, pt also complains of neck mass. On exam, and on CT, concern for thyroid CA. This was discussed with pt in detail. She does not appear to be clinically hyper- or hypothyroid. I notified her of CT results and told her to call PCP in AM to set up U/S and follow-up.  Pt also incidentally had mild local dermatitis reaction to purewick catheter. Topical hydrocortisone given. No signs of UTI. No signs of cellulitis.   Given pt's age, offered to discuss management and tx options with family but pt refuses. She does not want to worry them. Will be sent home w/ family. ____________________________________________  FINAL CLINICAL IMPRESSION(S) / ED DIAGNOSES  Final diagnoses:  Neck sprain, initial encounter  DDD (degenerative disc disease), cervical  Thyroid mass  Abnormal CT scan     MEDICATIONS GIVEN DURING THIS VISIT:  Medications    fentaNYL (SUBLIMAZE) injection 25 mcg (25 mcg Intravenous Refused 06/24/19 2346)  ketorolac (TORADOL) 30 MG/ML injection 15 mg (15 mg Intravenous Refused 06/24/19 2321)  hydrocortisone 1 % ointment ( Topical Given 06/25/19 0052)  diclofenac sodium (VOLTAREN) 1 % transdermal gel 4 g (4 g Topical Given 06/25/19 0051)  HYDROcodone-acetaminophen (NORCO/VICODIN) 5-325 MG per tablet 2 tablet (2 tablets Oral Given 06/24/19 2311)  phenazopyridine (PYRIDIUM) tablet 100 mg (100 mg Oral Given 06/25/19 0054)     ED Discharge Orders    None       Note:  This document was prepared using Dragon voice recognition software and may include unintentional dictation errors.  Duffy Bruce, MD 06/25/19 [phone]

## 2019-06-24 NOTE — ED Notes (Signed)
Pt refused fentanyl because she was not familiar with it and asked for hydrocodone like she takes at home. Dr Ellender Hose notified.

## 2019-06-24 NOTE — ED Notes (Signed)
Patient transported to CT 

## 2019-06-25 ENCOUNTER — Telehealth: Payer: Self-pay | Admitting: Family Medicine

## 2019-06-25 DIAGNOSIS — Z0189 Encounter for other specified special examinations: Secondary | ICD-10-CM

## 2019-06-25 DIAGNOSIS — S199XXA Unspecified injury of neck, initial encounter: Secondary | ICD-10-CM | POA: Diagnosis not present

## 2019-06-25 LAB — URINE CULTURE
MICRO NUMBER:: 871717
SPECIMEN QUALITY:: ADEQUATE

## 2019-06-25 NOTE — ED Notes (Signed)
Pt unable to sign using e signature pad due to poor vision. Pts daughter educated on discharge instructions over the phone and verbalized understanding. Pt moved to daughters car by wheelchair.

## 2019-06-25 NOTE — ED Notes (Signed)
Called patients daughter with update and discharge instructions. She stated that she was on her way to pick her mother up.

## 2019-06-25 NOTE — Telephone Encounter (Signed)
Patient was seen in office by me on Friday 06/22/19.  1. Results of Urine Culture now result as E Coli with several resistances, however only 10-50k CFU, may not be consistent with UTI if she is chronic colonizer.  2. Left Neck Mass - Planned to proceed with Neck Soft Tissue US, deferred to PCP to proceed vs ENT orders this week. However, over weekend patient already went to ED had Neck CT, which identified the following:  Large 4.5 x 4.2 mass appearing to arise from the left thyroid lobe with geographic areas of mixed hyper and hypoattenuation. Recommend further evaluation with thyroid ultrasound.  Also chart review shows previous Neck Soft Tissue US that was ordered in 06/2018 by prior PCP but it was never completed. Appears to be same issue, despite only a recent complaint again.  Notification to PCP to consider antibiotics if consistent with UTI symptoms still and also order Neck US to be scheduled.  [name], DO [facility] 06/25/2019, 8:16 AM

## 2019-06-25 NOTE — Addendum Note (Signed)
Addended by: Cassell Smiles R on: 06/25/2019 12:17 PM   Modules accepted: Orders

## 2019-06-26 ENCOUNTER — Telehealth: Payer: Self-pay

## 2019-06-26 DIAGNOSIS — Z0189 Encounter for other specified special examinations: Secondary | ICD-10-CM

## 2019-06-26 LAB — POCT URINALYSIS DIPSTICK
Bilirubin, UA: NEGATIVE
Blood, UA: NEGATIVE
Glucose, UA: NEGATIVE
Ketones, UA: NEGATIVE
Leukocytes, UA: NEGATIVE
Nitrite, UA: NEGATIVE
Protein, UA: NEGATIVE
Spec Grav, UA: 1.01 (ref 1.010–1.025)
Urobilinogen, UA: 0.2 E.U./dL
pH, UA: 5 (ref 5.0–8.0)

## 2019-06-26 NOTE — Telephone Encounter (Signed)
I received a message from Gilbert stating that she trying to schedule the [place] for [facility], scheduling stated the order needs to be US Thyroid.

## 2019-06-26 NOTE — Addendum Note (Signed)
Addended by: Frederich Cha D on: 06/26/2019 01:22 PM   Modules accepted: Orders

## 2019-07-02 ENCOUNTER — Ambulatory Visit: Payer: Medicare Other

## 2019-07-11 ENCOUNTER — Other Ambulatory Visit: Payer: Self-pay | Admitting: Family Medicine

## 2019-07-11 DIAGNOSIS — N3000 Acute cystitis without hematuria: Secondary | ICD-10-CM

## 2019-07-12 ENCOUNTER — Telehealth: Payer: Self-pay

## 2019-07-12 DIAGNOSIS — R300 Dysuria: Secondary | ICD-10-CM

## 2019-07-12 DIAGNOSIS — N3090 Cystitis, unspecified without hematuria: Secondary | ICD-10-CM

## 2019-07-12 MED ORDER — PHENAZOPYRIDINE HCL 100 MG PO TABS: 100.0000 mg | ORAL_TABLET | Freq: Three times a day (TID) | ORAL | 0 refills | Status: DC | PRN

## 2019-07-12 NOTE — Telephone Encounter (Signed)
The pt called complaining of urinary difficulty, bladder pain, and little burning with urination x 1 day. The pt state that the she attempted to call several times yesterday with no call back. She called the pharmacy and they sent her over 3 tablet of Cephalexin. She said she cannot come in the office because she have a neck sprain w/ neck brace and unable to get around. She is requesting that you send a abx to her pharmacy.  Please advise

## 2019-07-12 NOTE — Telephone Encounter (Signed)
I spoke with the patient and notified her of Laurens recommendation.  She verbalize understanding.

## 2019-07-12 NOTE — Telephone Encounter (Signed)
The pt called back complaining of burning all over after taking the Pyridium x 1 hr ago.  She is requesting that Lauren call her in antibiotic to help with the burning, because she will not take this medication again. I instructed the patient that she need to be evaluated if she is truly burning all over. She declined she said she cannot go anywhere because of her immobility due to her neck strain. I informed her that she have taken this medication in the past its the same as AZO. She wanted to know if she can take AZO instead.  Verbal order pre Lauren the patient can take AZO every eight hours as need for the dysuria.

## 2019-07-12 NOTE — Telephone Encounter (Signed)
Patient cannot continue to be treated via telephone.  She needs to follow prior medical advice for keeping appointments and completing a referral to urology. - I sent bladder pain relief medication to her pharmacy.  Last symptoms were not consistent with active infection as colony count not high enough.  Opted not to treat.  If patient has fever or chills she can be seen again in clinic.  No telephone visit.

## 2019-07-13 ENCOUNTER — Encounter: Payer: Self-pay | Admitting: *Deleted

## 2019-07-13 NOTE — Progress Notes (Signed)
This encounter was created in error - please disregard.

## 2019-07-15 DIAGNOSIS — R39198 Other difficulties with micturition: Secondary | ICD-10-CM | POA: Diagnosis not present

## 2019-07-15 DIAGNOSIS — Z23 Encounter for immunization: Secondary | ICD-10-CM | POA: Diagnosis not present

## 2019-07-18 ENCOUNTER — Telehealth: Payer: Self-pay | Admitting: Nurse Practitioner

## 2019-07-18 DIAGNOSIS — F411 Generalized anxiety disorder: Secondary | ICD-10-CM

## 2019-07-18 NOTE — Telephone Encounter (Signed)
Pt daughter called requesting refill on Lorazepam

## 2019-07-19 ENCOUNTER — Telehealth: Payer: Self-pay

## 2019-07-19 MED ORDER — LORAZEPAM 0.5 MG PO TABS: 0.5000 mg | ORAL_TABLET | Freq: Three times a day (TID) | ORAL | 2 refills | Status: DC | PRN

## 2019-07-19 NOTE — Telephone Encounter (Signed)
Spoke with the patient daughter and notified her that the patient refill request was submitted over to her pharmacy.

## 2019-07-25 ENCOUNTER — Ambulatory Visit: Payer: Medicare Other | Admitting: Urology

## 2019-07-25 DIAGNOSIS — Z79891 Long term (current) use of opiate analgesic: Secondary | ICD-10-CM | POA: Diagnosis not present

## 2019-07-25 DIAGNOSIS — I10 Essential (primary) hypertension: Secondary | ICD-10-CM | POA: Diagnosis not present

## 2019-07-25 DIAGNOSIS — M961 Postlaminectomy syndrome, not elsewhere classified: Secondary | ICD-10-CM | POA: Diagnosis not present

## 2019-07-30 ENCOUNTER — Ambulatory Visit (INDEPENDENT_AMBULATORY_CARE_PROVIDER_SITE_OTHER): Payer: Medicare Other | Admitting: *Deleted

## 2019-07-30 DIAGNOSIS — I10 Essential (primary) hypertension: Secondary | ICD-10-CM | POA: Diagnosis not present

## 2019-07-30 DIAGNOSIS — M1990 Unspecified osteoarthritis, unspecified site: Secondary | ICD-10-CM

## 2019-07-30 DIAGNOSIS — F411 Generalized anxiety disorder: Secondary | ICD-10-CM

## 2019-07-30 DIAGNOSIS — R413 Other amnesia: Secondary | ICD-10-CM

## 2019-07-30 NOTE — Chronic Care Management (AMB) (Signed)
Chronic Care Management   Initial Visit Note  07/30/2019 Name: [name] MRN: [id number] DOB: 01/18/1925  Referred by: Kennedy, Lauren Renee, NP Reason for referral : No chief complaint on file.   [name] is a 85 y.o. year old female who is a primary care patient of Kennedy, Lauren Renee, NP. The CCM team was consulted for assistance with chronic disease management and care coordination needs related to HTN, Anxiety, Dementia and caregiver strain  4pm- Initial call placed to patient's home. Patient's caregiver answered, took my name and number and stated she would have [name] daughter call me after she gets home from work around 5.  5:15pm-Return from daughter Claudia, started CCM evaluation then patient noted to be yelling in the background stating "I'm on fire. I gotta have something I am in so much pain." Daughter had to end the conversation with me to care for her mother. She stated she would try to call me back.  6pm- Claudia called back. She expressed extreme caregiver strain from she and her sister in dealing with their mom. She stated her mom will not listen to them and has a lot of anxiety. They are trying to hire a caregiver but they do not have a lot of money to maintain this. At times their mother has to be alone because they both work. Discussed palliative care and community resources.   Review of patient status, including review of consultants reports, relevant laboratory and other test results, and collaboration with appropriate care team members and the patient's provider was performed as part of comprehensive patient evaluation and provision of chronic care management services.    SDOH (Social Determinants of Health) screening performed today: Depression   Advanced Directives Stress Physical Activity. See Care Plan for related entries.   Advanced Directives Status: N See Care Plan and Vynca application for related entries.   Medications: Outpatient  Encounter Medications as of 07/30/2019  Medication Sig Note  . amLODipine (NORVASC) 5 MG tablet Take 1 tablet (5 mg total) by mouth daily.   . carvedilol (COREG) 6.25 MG tablet Take 1 tablet (6.25 mg total) by mouth 2 (two) times daily with a meal.   . furosemide (LASIX) 20 MG tablet Take 1 tablet po QD prn   . HYDROcodone-acetaminophen (NORCO) 10-325 MG tablet Take 1 tablet by mouth every 4 (four) hours as needed for moderate pain.  07/29/2018: Takes fairly regularly   . LORazepam (ATIVAN) 0.5 MG tablet Take 1 tablet (0.5 mg total) by mouth 3 (three) times daily as needed for anxiety.   . mirtazapine (REMERON) 30 MG tablet Take 1 tablet (30 mg total) by mouth at bedtime.   . ondansetron (ZOFRAN) 4 MG tablet Take 1 tablet (4 mg total) by mouth every 8 (eight) hours as needed for nausea.   . phenazopyridine (PYRIDIUM) 100 MG tablet Take 1 tablet (100 mg total) by mouth 3 (three) times daily as needed (bladder pain).   . tamsulosin (FLOMAX) 0.4 MG CAPS capsule Take 1 capsule (0.4 mg total) by mouth daily.    No facility-administered encounter medications on file as of 07/30/2019.      Objective:   Goals Addressed            This Visit's Progress   . we need help with mom's care (pt-stated)       Current Barriers:  . Knowledge Deficits related to community resources to assist with patient's care . Lacks caregiver support.  . Financial Constraints.  .   Cognitive Deficits . No Advanced Directives in place  Nurse Case Manager Clinical Goal(s):  . Over the next 90 days, patient will work with RNCM to address needs related to Caregiver strain.   Interventions: ALL INTERVENTIONS DISCUSSED WITH DAUGHTER-Claudia . Reviewed medications with patient and discussed patient's anxiety, depression and pain.  . Discussed plans with patient for ongoing care management follow up and provided patient with direct contact information for care management team . Provided patient and/or caregiver with  verbal  information about palliative care, [employer] [facility], and [facility] (community resource).-Plan to mail resources to [name] who lives in the home. . Long discussion with daughter Claudia about what she and her sister have been dealing with as they care for their mom.  . Claudia discusses patient can not see well, is hard of hearing, has extreme anxiety, pain and depression. She notes patient had a very traumatic childhood and she has dealt with anxiety her whole adult life.  . Claudia discussed she and her sister have been caring for their mother for awhile now and her care has gotten increasingly more difficult. She states she and her sister both continue to work. She stated patient has had some falls, so they have recently hired a caregiver to help, but are unsure if they will be able to continue to afford this.  . Daughter reports patient has become more confused in the last few months.  . Daughter does assist with medications but patient takes her own medications at noon.  . Discussed with daughter the need for patient to have an US of her neck, daughter said it was scheduled and when the day of the appointment arrived the patient refused to go.   Patient Self Care Activities:  . Calls provider office for new concerns or questions inappropriately at times  . Unable to independently care for self . Does not attend all scheduled provider appointments . Unable to perform IADLs independently . Vision impairment . Hearing impairment  Initial goal documentation           Ms. Merendino was given information about Chronic Care Management services today including:  1. CCM service includes personalized support from designated clinical staff supervised by her physician, including individualized plan of care and coordination with other care providers 2. 24/7 contact phone numbers for assistance for urgent and routine care needs. 3. Service will only be billed when office clinical staff  spend 20 minutes or more in a month to coordinate care. 4. Only one practitioner may furnish and bill the service in a calendar month. 5. The patient may stop CCM services at any time (effective at the end of the month) by phone call to the office staff. 6. The patient will be responsible for cost sharing (co-pay) of up to 20% of the service fee (after annual deductible is met).  Patient agreed to services and verbal consent obtained.   Plan:   The care management team will reach out to the patient again over the next 30 days.  The patient has been provided with contact information for the care management team and has been advised to call with any health related questions or concerns.     RN, BSN Nurse Case Manager [facility] Care Management  ([phone]) [employer]    

## 2019-07-30 NOTE — Patient Instructions (Signed)
Thank you allowing the Chronic Care Management Team to be a part of your care! It was a pleasure speaking with you today!   CCM (Chronic Care Management) Team   Lindel Marcell RN, BSN Nurse Care Coordinator  [phone]  Harlow Asa PharmD  Clinical Pharmacist  [phone]  Eula Fried LCSW Clinical Social Worker [phone]  Goals Addressed            This Visit's Progress   . we need help with mom's care (pt-stated)       Current Barriers:  [name] Knowledge Deficits related to community resources to assist with patient's care . Lacks caregiver support.  . Film/video editor.  . Cognitive Deficits . No Advanced Directives in place  Nurse Case Manager Clinical Goal(s):  [name] Over the next 90 days, patient will work with [facility] to address needs related to Caregiver strain.   Interventions: ALL INTERVENTIONS DISCUSSED WITH DAUGHTER-Claudia . Reviewed medications with patient and discussed patient's anxiety, depression and pain.  . Discussed plans with patient for ongoing care management follow up and provided patient with direct contact information for care management team . Provided patient and/or caregiver with verbal  information about palliative care, [employer], and Museum/gallery curator (Data processing manager).-Plan to mail resources to [place] who lives in the home. . Long discussion with daughter Rosemarie Ax about what she and her sister have been dealing with as they care for their mom.  Rosemarie Ax discusses patient can not see well, is hard of hearing, has extreme anxiety, pain and depression. She notes patient had a very traumatic childhood and she has dealt with anxiety her whole adult life.  Rosemarie Ax discussed she and her sister have been caring for their mother for awhile now and her care has gotten increasingly more difficult. She states she and her sister both continue to work. She stated patient has had some falls, so they have recently hired a caregiver to help, but  are unsure if they will be able to continue to afford this.  . Daughter reports patient has become more confused in the last few months.  . Daughter does assist with medications but patient takes her own medications at noon.  . Discussed with daughter the need for patient to have an [place] of her neck, daughter said it was scheduled and when the day of the appointment arrived the patient refused to go.   Patient Self Care Activities:  . Calls provider office for new concerns or questions inappropriately at times  . Unable to independently care for self . Does not attend all scheduled provider appointments . Unable to perform IADLs independently . Vision impairment . Hearing impairment  Initial goal documentation          The patient verbalized understanding of instructions provided today and declined a print copy of patient instruction materials.   The patient has been provided with contact information for the care management team and has been advised to call with any health related questions or concerns.

## 2019-07-31 ENCOUNTER — Other Ambulatory Visit: Payer: Self-pay | Admitting: Urology

## 2019-07-31 DIAGNOSIS — R339 Retention of urine, unspecified: Secondary | ICD-10-CM

## 2019-08-03 ENCOUNTER — Ambulatory Visit: Payer: Medicare Other | Admitting: Urology

## 2019-08-10 ENCOUNTER — Other Ambulatory Visit: Payer: Self-pay

## 2019-08-10 ENCOUNTER — Ambulatory Visit (INDEPENDENT_AMBULATORY_CARE_PROVIDER_SITE_OTHER): Payer: Medicare Other | Admitting: Physician Assistant

## 2019-08-10 ENCOUNTER — Encounter: Payer: Self-pay | Admitting: Physician Assistant

## 2019-08-10 DIAGNOSIS — M25562 Pain in left knee: Secondary | ICD-10-CM | POA: Diagnosis not present

## 2019-08-10 DIAGNOSIS — N3000 Acute cystitis without hematuria: Secondary | ICD-10-CM

## 2019-08-10 MED ORDER — CEPHALEXIN 500 MG PO CAPS: 500.0000 mg | ORAL_CAPSULE | Freq: Three times a day (TID) | ORAL | 0 refills | Status: AC

## 2019-08-10 NOTE — Progress Notes (Signed)
Subjective:    Patient ID: [name], female    DOB: [birth year], 85 y.o.   MRN: [id number]  [name] is a 85 y.o. female presenting on 08/10/2019 for Dysuria (x 3 days. The pt took a OTC dipstick test that was positive. Urinary frequency w/ strong foul odor, with urgency. ) and Fall (the pt fell down 2 weeks ago injuried her back, bilateral knees pain w/ more discomfort with the left knee. Lose her balance turn at the sink with her walker.  )   HPI   Patient is a 85 y/o woman with history of dementia presenting today for dysuria x 3 days. History is supplemented by patient's daughter, [name]. Patient has chronic colonization with E. Coli that is MDR. Chills that are bad but no measured temperature. Abdominal pain across her stomach. Denies vomiting or diarrhea. Daughter reports patient took a home urinary tract infection test and it was positive.   Patient also fell 2 weeks ago - did not hit her head and did not lose consciousness. Reports her left knee is still hurting her since that fall. Patient has declined in person visit.   Social History   Tobacco Use  . Smoking status: Never Smoker  . Smokeless tobacco: Never Used  Substance Use Topics  . Alcohol use: No  . Drug use: No    Review of Systems Per HPI unless specifically indicated above     Objective:    There were no vitals taken for this visit.  Wt Readings from Last 3 Encounters:  06/24/19 115 lb (52.2 kg)  11/30/18 122 lb 9.6 oz (55.6 kg)  11/11/18 117 lb (53.1 kg)    Physical Exam Results for orders placed or performed during the hospital encounter of 06/24/19  CBC with Differential/Platelet  Result Value Ref Range   WBC 6.6 4.0 - 10.5 K/uL   RBC 4.63 3.87 - 5.11 MIL/uL   Hemoglobin 12.2 12.0 - 15.0 g/dL   HCT 36.9 36.0 - 46.0 %   MCV 79.7 (L) 80.0 - 100.0 fL   MCH 26.3 26.0 - 34.0 pg   MCHC 33.1 30.0 - 36.0 g/dL   RDW 13.6 11.5 - 15.5 %   Platelets 261 150 - 400 K/uL   nRBC 0.0 0.0 - 0.2 %     Neutrophils Relative % 46 %   Neutro Abs 3.1 1.7 - 7.7 K/uL   Lymphocytes Relative 36 %   Lymphs Abs 2.4 0.7 - 4.0 K/uL   Monocytes Relative 15 %   Monocytes Absolute 1.0 0.1 - 1.0 K/uL   Eosinophils Relative 2 %   Eosinophils Absolute 0.1 0.0 - 0.5 K/uL   Basophils Relative 1 %   Basophils Absolute 0.1 0.0 - 0.1 K/uL   Immature Granulocytes 0 %   Abs Immature Granulocytes 0.02 0.00 - 0.07 K/uL  Comprehensive metabolic panel  Result Value Ref Range   Sodium 131 (L) 135 - 145 mmol/L   Potassium 4.1 3.5 - 5.1 mmol/L   Chloride 99 98 - 111 mmol/L   CO2 25 22 - 32 mmol/L   Glucose, Bld 108 (H) 70 - 99 mg/dL   BUN 9 8 - 23 mg/dL   Creatinine, Ser 0.50 0.44 - 1.00 mg/dL   Calcium 10.0 8.9 - 10.3 mg/dL   Total Protein 6.8 6.5 - 8.1 g/dL   Albumin 3.9 3.5 - 5.0 g/dL   AST 33 15 - 41 U/L   ALT 18 0 - 44 U/L  Alkaline Phosphatase 79 38 - 126 U/L   Total Bilirubin 0.7 0.3 - 1.2 mg/dL   GFR calc non Af Amer >60 >60 mL/min   GFR calc Af Amer >60 >60 mL/min   Anion gap 7 5 - 15  TSH  Result Value Ref Range   TSH 1.279 0.350 - 4.500 uIU/mL  T4, free  Result Value Ref Range   Free T4 1.02 0.61 - 1.12 ng/dL  Urinalysis, Complete w Microscopic  Result Value Ref Range   Color, Urine STRAW (A) YELLOW   APPearance CLEAR (A) CLEAR   Specific Gravity, Urine 1.005 1.005 - 1.030   pH 8.0 5.0 - 8.0   Glucose, UA NEGATIVE NEGATIVE mg/dL   Hgb urine dipstick SMALL (A) NEGATIVE   Bilirubin Urine NEGATIVE NEGATIVE   Ketones, ur NEGATIVE NEGATIVE mg/dL   Protein, ur NEGATIVE NEGATIVE mg/dL   Nitrite NEGATIVE NEGATIVE   Leukocytes,Ua NEGATIVE NEGATIVE   RBC / HPF 0-5 0 - 5 RBC/hpf   WBC, UA 0-5 0 - 5 WBC/hpf   Bacteria, UA NONE SEEN NONE SEEN   Squamous Epithelial / LPF 0-5 0 - 5      Assessment & Plan:  1. Acute cystitis without hematuria  No urine sample today. Unclear if acute infection but patient endorsing worsening of symptoms. She does appear to be chronically colonized with MDR  e. Coli. Daughter reports Keflex is the only thing this patient can take. Will send it in as below. She does have an upcoming appointment with Dr. Bernardo Heater on 08/24/2019 for this chronic issue.   - cephALEXin (KEFLEX) 500 MG capsule; Take 1 capsule (500 mg total) by mouth 3 (three) times daily for 7 days.  Dispense: 21 capsule; Refill: 0  2. Acute pain of left knee  She has been advised that the [facility] imaging center is below but she can be seen at the [facility] in [place], [place] - DG Knee Complete 4 Views Left; Future    Follow up plan: No follow-ups on file.  Carles Collet, PA-C [employer] 08/10/2019, 3:13 PM

## 2019-08-10 NOTE — Patient Instructions (Signed)

## 2019-08-12 ENCOUNTER — Other Ambulatory Visit: Payer: Self-pay | Admitting: Family Medicine

## 2019-08-12 DIAGNOSIS — I10 Essential (primary) hypertension: Secondary | ICD-10-CM

## 2019-08-14 ENCOUNTER — Other Ambulatory Visit: Payer: Self-pay

## 2019-08-14 ENCOUNTER — Emergency Department
Admission: EM | Admit: 2019-08-14 | Discharge: 2019-08-15 | Disposition: A | Discharge status: Home / Self Care | Payer: Medicare Other | Attending: Emergency Medicine | Admitting: Emergency Medicine

## 2019-08-14 ENCOUNTER — Encounter: Payer: Self-pay | Admitting: *Deleted

## 2019-08-14 DIAGNOSIS — R339 Retention of urine, unspecified: Secondary | ICD-10-CM

## 2019-08-14 DIAGNOSIS — R1030 Lower abdominal pain, unspecified: Secondary | ICD-10-CM | POA: Diagnosis not present

## 2019-08-14 DIAGNOSIS — N3000 Acute cystitis without hematuria: Secondary | ICD-10-CM | POA: Diagnosis not present

## 2019-08-14 DIAGNOSIS — I10 Essential (primary) hypertension: Secondary | ICD-10-CM | POA: Insufficient documentation

## 2019-08-14 DIAGNOSIS — R0902 Hypoxemia: Secondary | ICD-10-CM | POA: Diagnosis not present

## 2019-08-14 DIAGNOSIS — Z79899 Other long term (current) drug therapy: Secondary | ICD-10-CM | POA: Insufficient documentation

## 2019-08-14 DIAGNOSIS — R109 Unspecified abdominal pain: Secondary | ICD-10-CM | POA: Diagnosis present

## 2019-08-14 DIAGNOSIS — R404 Transient alteration of awareness: Secondary | ICD-10-CM | POA: Diagnosis not present

## 2019-08-14 DIAGNOSIS — R5381 Other malaise: Secondary | ICD-10-CM | POA: Diagnosis not present

## 2019-08-14 DIAGNOSIS — W19XXXA Unspecified fall, initial encounter: Secondary | ICD-10-CM | POA: Diagnosis not present

## 2019-08-14 DIAGNOSIS — R000 Tachycardia, unspecified: Secondary | ICD-10-CM | POA: Diagnosis not present

## 2019-08-14 LAB — URINALYSIS, COMPLETE (UACMP) WITH MICROSCOPIC
Bilirubin Urine: NEGATIVE
Glucose, UA: NEGATIVE mg/dL
Hgb urine dipstick: NEGATIVE
Ketones, ur: NEGATIVE mg/dL
Leukocytes,Ua: NEGATIVE
Nitrite: POSITIVE — AB
Protein, ur: NEGATIVE mg/dL
Specific Gravity, Urine: 1.003 — ABNORMAL LOW (ref 1.005–1.030)
pH: 7 (ref 5.0–8.0)

## 2019-08-14 LAB — COMPREHENSIVE METABOLIC PANEL
ALT: 10 U/L (ref 0–44)
AST: 17 U/L (ref 15–41)
Albumin: 3.2 g/dL — ABNORMAL LOW (ref 3.5–5.0)
Alkaline Phosphatase: 55 U/L (ref 38–126)
Anion gap: 8 (ref 5–15)
BUN: 10 mg/dL (ref 8–23)
CO2: 20 mmol/L — ABNORMAL LOW (ref 22–32)
Calcium: 8 mg/dL — ABNORMAL LOW (ref 8.9–10.3)
Chloride: 105 mmol/L (ref 98–111)
Creatinine, Ser: 0.41 mg/dL — ABNORMAL LOW (ref 0.44–1.00)
GFR calc Af Amer: >60 mL/min (ref >60)
GFR calc non Af Amer: >60 mL/min (ref >60)
Glucose, Bld: 83 mg/dL (ref 70–99)
Potassium: 3.2 mmol/L — ABNORMAL LOW (ref 3.5–5.1)
Sodium: 133 mmol/L — ABNORMAL LOW (ref 135–145)
Total Bilirubin: 0.7 mg/dL (ref 0.3–1.2)
Total Protein: 5.8 g/dL — ABNORMAL LOW (ref 6.5–8.1)

## 2019-08-14 LAB — CBC
HCT: 37.9 % (ref 36.0–46.0)
Hemoglobin: 12.2 g/dL (ref 12.0–15.0)
MCH: 25.6 pg — ABNORMAL LOW (ref 26.0–34.0)
MCHC: 32.2 g/dL (ref 30.0–36.0)
MCV: 79.6 fL — ABNORMAL LOW (ref 80.0–100.0)
Platelets: 213 10*3/uL (ref 150–400)
RBC: 4.76 MIL/uL (ref 3.87–5.11)
RDW: 13.9 % (ref 11.5–15.5)
WBC: 7.5 10*3/uL (ref 4.0–10.5)
nRBC: 0 % (ref 0.0–0.2)

## 2019-08-14 LAB — TROPONIN I (HIGH SENSITIVITY): Troponin I (High Sensitivity): 5 ng/L (ref <18)

## 2019-08-14 MED ORDER — SODIUM CHLORIDE 0.9 % IV SOLN
1.0000 g | Freq: Once | INTRAVENOUS | Status: AC
  Administered 2019-08-14: 23:00:00 1 g via INTRAVENOUS
  Filled 2019-08-14: qty 10

## 2019-08-14 MED ORDER — HYDROCODONE-ACETAMINOPHEN 5-325 MG PO TABS
2.0000 | ORAL_TABLET | Freq: Once | ORAL | Status: AC
  Administered 2019-08-14: 23:00:00 2 via ORAL
  Filled 2019-08-14: qty 2

## 2019-08-14 MED ORDER — HYDROCODONE-ACETAMINOPHEN 5-325 MG PO TABS: 1.0000 | ORAL_TABLET | ORAL | Status: DC | PRN

## 2019-08-14 MED ORDER — HYDROCODONE-ACETAMINOPHEN 5-325 MG PO TABS
2.0000 | ORAL_TABLET | Freq: Once | ORAL | Status: AC
  Administered 2019-08-14: 2 via ORAL
  Filled 2019-08-14: qty 2

## 2019-08-14 NOTE — ED Provider Notes (Addendum)
[facility] Emergency Department Provider Note   ____________________________________________   First MD Initiated Contact with Patient 08/14/19 2238     (approximate)  I have reviewed the triage vital signs and the nursing notes.   HISTORY  Chief Complaint Urinary Frequency    HPI [name] is a 85 y.o. female with past medical history of hypertension, chronic back pain, and urinary retention presents to the ED complaining of abdominal pain and dysuria.  History is limited as patient is very hard of hearing.  She states she has been dealing with discomfort in her lower abdomen for the past 3 to 4 days as well as burning when she urinates.  She has not had any fevers and denies any nausea, vomiting, or flank pain.  She does state that has become very difficult for her to urinate.  She was seen by her PCP a few days prior and started on Keflex for presumed UTI.  Patient states that Keflex is the only antibiotic that she can take due to medication allergies.        Past Medical History:  Diagnosis Date  . Allergy    environmental  . Anxiety   . Chronic back pain   . High cholesterol   . History of urinary retention   . Hypertension   . Migraines   . Osteoporosis   . Pneumonia   . Sinusitis     Patient Active Problem List   Diagnosis Date Noted  . Macular degeneration 09/04/2018  . Cervical lymphadenopathy 07/18/2018  . Pain in right hip 07/18/2018  . Lymphadenopathy 06/25/2018  . Stomatitis and mucositis 06/25/2018  . Shortness of breath 02/16/2018  . Arm fracture, left 01/03/2018  . Arthritis 01/03/2018  . Compression fracture of thoracic vertebra ([place]) 01/03/2018  . Emphysema lung ([place]) 01/03/2018  . GERD (gastroesophageal reflux disease) 01/03/2018  . GI bleeding 01/03/2018  . Lumbar spondylosis 01/03/2018  . Menstrual problem 01/03/2018  . Osteoarthritis of knee 01/03/2018  . Osteoporosis 01/03/2018  . Acute pain of right  knee 01/03/2018  . Synovial cyst of popliteal space 01/03/2018  . History of urinary retention 11/10/2017  . Generalized anxiety disorder 10/24/2017  . Constipation 10/24/2017  . Internal hemorrhoids 10/24/2017  . Essential (primary) hypertension 10/24/2017  . Thrombocytopenia ([place]) 10/24/2017  . Acute encephalopathy 08/04/2017  . Hyponatremia 12/15/2015    Past Surgical History:  Procedure Laterality Date  . ABDOMINAL HYSTERECTOMY    . APPENDECTOMY    . BACK SURGERY    . BACK SURGERY  2008   2009  . BREAST BIOPSY Right 2002   neg  . HEMORRHOID SURGERY    . MOUTH SURGERY  2019  . TONSILLECTOMY      Prior to Admission medications   Medication Sig Start Date End Date Taking? Authorizing Provider  amLODipine (NORVASC) 5 MG tablet Take 1 tablet (5 mg total) by mouth daily. 01/19/19   Mikey College, NP  carvedilol (COREG) 6.25 MG tablet Take 1 tablet (6.25 mg total) by mouth 2 (two) times daily with a meal. 08/13/19   Karamalegos, [name], DO  cephALEXin (KEFLEX) 500 MG capsule Take 1 capsule (500 mg total) by mouth 3 (three) times daily for 7 days. 08/10/19 08/17/19  [name], PA-C  FLUoxetine (PROZAC) 10 MG capsule Take 20 mg by mouth daily.  07/27/19   [provider]  furosemide (LASIX) 20 MG tablet Take 1 tablet po QD prn 01/19/19   Mikey College,  NP  HYDROcodone-acetaminophen (NORCO) 10-325 MG tablet Take 1 tablet by mouth every 4 (four) hours as needed for moderate pain.     [provider]  LORazepam (ATIVAN) 0.5 MG tablet Take 1 tablet (0.5 mg total) by mouth 3 (three) times daily as needed for anxiety. 07/19/19   Mikey College, NP  mirtazapine (REMERON) 30 MG tablet Take 1 tablet (30 mg total) by mouth at bedtime. 01/19/19   Mikey College, NP  ondansetron (ZOFRAN) 4 MG tablet Take 1 tablet (4 mg total) by mouth every 8 (eight) hours as needed for nausea. Patient not taking: Reported on 08/10/2019 06/15/19   Mikey College, NP  phenazopyridine (PYRIDIUM) 100 MG tablet Take 1 tablet (100 mg total) by mouth 3 (three) times daily as needed (bladder pain). Patient not taking: Reported on 08/10/2019 07/12/19   Mikey College, NP  tamsulosin (FLOMAX) 0.4 MG CAPS capsule Take 1 capsule (0.4 mg total) by mouth daily. 07/31/19   Stoioff, Ronda Fairly, MD    Allergies Ceftin [cefuroxime], Cortisone, Fluticasone, Levofloxacin, Meperidine, Morphine and related, Morphine sulfate, Nisoldipine, Penicillin g potassium in d5w, Prednisone, and Sulfa antibiotics  Family History  Problem Relation Age of Onset  . Breast cancer Sister 39  . Breast cancer Maternal Grandmother 79  . Breast cancer Sister 86  . Bladder Cancer Brother   . Hypertension Neg Hx   . Kidney cancer Neg Hx     Social History Social History   Tobacco Use  . Smoking status: Never Smoker  . Smokeless tobacco: Never Used  Substance Use Topics  . Alcohol use: No  . Drug use: No    Review of Systems  Constitutional: No fever/chills Eyes: No visual changes. ENT: No sore throat. Cardiovascular: Denies chest pain. Respiratory: Denies shortness of breath. Gastrointestinal: Positive for abdominal pain.  No nausea, no vomiting.  No diarrhea.  No constipation. Genitourinary: Positive for dysuria. Musculoskeletal: Negative for back pain. Skin: Negative for rash. Neurological: Negative for headaches, focal weakness or numbness.  ____________________________________________   PHYSICAL EXAM:  VITAL SIGNS: ED Triage Vitals  Enc Vitals Group     BP 08/14/19 1703 (!) 142/87     Pulse Rate 08/14/19 1703 90     Resp 08/14/19 1703 20     Temp 08/14/19 1703 97.9 F (36.6 C)     Temp Source 08/14/19 1703 Oral     SpO2 08/14/19 1705 95 %     Weight 08/14/19 1704 130 lb (59 kg)     Height 08/14/19 1704 5\' 3"  (1.6 m)     Head Circumference --      Peak Flow --      Pain Score 08/14/19 1704 0     Pain Loc --      Pain Edu? --       Excl. in [place]? --     Constitutional: Alert and oriented. Eyes: Conjunctivae are normal. Head: Atraumatic. Nose: No congestion/rhinnorhea. Mouth/Throat: Mucous membranes are moist. Neck: Normal ROM Cardiovascular: Normal rate, regular rhythm. Grossly normal heart sounds. Respiratory: Normal respiratory effort.  No retractions. Lungs CTAB. Gastrointestinal: Soft and tender to palpation in the suprapubic area with palpable distended bladder. Genitourinary: deferred Musculoskeletal: No lower extremity tenderness nor edema. Neurologic:  Normal speech and language. No gross focal neurologic deficits are appreciated. Skin:  Skin is warm, dry and intact. No rash noted. Psychiatric: Mood and affect are normal. Speech and behavior are normal.  ____________________________________________   ED ECG REPORT I, Juanda Crumble  Micheline Markes, the attending physician, personally viewed and interpreted this ECG.   Date: 08/14/2019  EKG Time: 17:30  Rate: 88  Rhythm: normal sinus rhythm  Axis: Normal  Intervals:none  ST&T Change: None   LABS (all labs ordered are listed, but only abnormal results are displayed)  Labs Reviewed  COMPREHENSIVE METABOLIC PANEL - Abnormal; Notable for the following components:      Result Value   Sodium 133 (*)    Potassium 3.2 (*)    CO2 20 (*)    Creatinine, Ser 0.41 (*)    Calcium 8.0 (*)    Total Protein 5.8 (*)    Albumin 3.2 (*)    All other components within normal limits  CBC - Abnormal; Notable for the following components:   MCV 79.6 (*)    MCH 25.6 (*)    All other components within normal limits  URINALYSIS, COMPLETE (UACMP) WITH MICROSCOPIC - Abnormal; Notable for the following components:   Color, Urine AMBER (*)    APPearance CLEAR (*)    Specific Gravity, Urine 1.003 (*)    Nitrite POSITIVE (*)    Bacteria, UA RARE (*)    All other components within normal limits  URINE CULTURE  TROPONIN I (HIGH SENSITIVITY)     PROCEDURES  Procedure(s)  performed (including Critical Care):  Procedures   ____________________________________________   INITIAL IMPRESSION / ASSESSMENT AND PLAN / ED COURSE       85 year old female presents to the ED complaining of difficulty urinating as well as some burning when she urinates.  UA is consistent with UTI given positive nitrites, given she has tolerated Keflex in the past, will treat with Rocephin and culture urine.  Prior culture shows sensitivity to Keflex and given patient's numerous medication allergies, will continue this.  She did have a bladder scan showing greater than 600 cc of urine in her bladder.  Foley catheter was placed with improvement in patient's pain and she already has follow-up with urology scheduled tomorrow morning.  Spoke with patient's daughter regarding the plan and daughter will ensure the patient gets to her urology appointment in the morning.  Patient to be observed while she receives IV antibiotics, will provide with her chronic pain medication.  She will be appropriate for discharge once IV antibiotics completed.      ____________________________________________   FINAL CLINICAL IMPRESSION(S) / ED DIAGNOSES  Final diagnoses:  Urinary retention  Acute cystitis without hematuria     ED Discharge Orders    None       Note:  This document was prepared using Dragon voice recognition software and may include unintentional dictation errors.   Blake Divine, MD 08/14/19 2322    Blake Divine, MD 08/14/19 [phone]

## 2019-08-14 NOTE — ED Triage Notes (Signed)
Pt to triage via wheelchair.  Pt brought in via ems from home.  Pt is on meds for uti and family reports that she is not improving per ems.  Pt alert.  Iv in place.

## 2019-08-14 NOTE — ED Notes (Signed)
Lab called and inform add on urine culture.

## 2019-08-14 NOTE — ED Notes (Signed)
Bladder scan preformed Patient had 629 ml in bladder. Scan preformed twice. [place] notified. Verbal order for foley to be inserted. This Probation officer inserted 16 fr foley cath with ED tech [facility].

## 2019-08-15 ENCOUNTER — Ambulatory Visit: Payer: Medicare Other | Admitting: Physician Assistant

## 2019-08-15 ENCOUNTER — Ambulatory Visit: Payer: Medicare Other | Admitting: Urology

## 2019-08-15 LAB — URINE CULTURE: Culture: NO GROWTH

## 2019-08-15 MED ORDER — CEPHALEXIN 500 MG PO CAPS: 500.0000 mg | ORAL_CAPSULE | Freq: Three times a day (TID) | ORAL | 0 refills | Status: AC

## 2019-08-15 NOTE — Discharge Instructions (Addendum)

## 2019-08-15 NOTE — ED Notes (Signed)
Daughter notified of discharge.

## 2019-08-15 NOTE — ED Provider Notes (Signed)
Ptient finished IV abx. Will be dc per plan from Dr. Charna Archer.   Rudene Re, MD 08/15/19 [phone]

## 2019-08-16 ENCOUNTER — Encounter: Payer: Self-pay | Admitting: Urology

## 2019-08-16 ENCOUNTER — Other Ambulatory Visit: Payer: Self-pay

## 2019-08-16 ENCOUNTER — Ambulatory Visit (INDEPENDENT_AMBULATORY_CARE_PROVIDER_SITE_OTHER): Payer: Medicare Other | Admitting: Urology

## 2019-08-16 VITALS — BP 115/70 | HR 76 | Ht 60.0 in | Wt 130.0 lb

## 2019-08-16 DIAGNOSIS — T839XXA Unspecified complication of genitourinary prosthetic device, implant and graft, initial encounter: Secondary | ICD-10-CM

## 2019-08-16 DIAGNOSIS — R339 Retention of urine, unspecified: Secondary | ICD-10-CM | POA: Diagnosis not present

## 2019-08-16 NOTE — Progress Notes (Addendum)
08/16/2019 9:09 AM   [name] [birth year] [id number]  Referring provider: Olin Hauser, DO [address] [place],  [place] [address]  Chief Complaint  Patient presents with  . Follow-up    HPI: [name] is a 85 year old female with a history of recurrent urinary retention who had a Foley catheter placed in the emergency department on November 3 and family is fearful she may have tugged on it with her daughter, [name].   She presented to the emergency room on November 3 with a complaint of difficulty urinating and dysuria.  Bladder was palpable on exam and bladder scan demonstrated a greater than 600 cc of urine.  Daughter states that they obtained 1 L of urine once the catheter was placed.  During the night, the caregiver had noticed that there was blood dripping down the Foley tube and the patient was complaining of pain.  Daughter is fearful that her mother had dispositioned the catheter during the night.  PMH: Past Medical History:  Diagnosis Date  . Allergy    environmental  . Anxiety   . Chronic back pain   . High cholesterol   . History of urinary retention   . Hypertension   . Migraines   . Osteoporosis   . Pneumonia   . Sinusitis     Surgical History: Past Surgical History:  Procedure Laterality Date  . ABDOMINAL HYSTERECTOMY    . APPENDECTOMY    . BACK SURGERY    . BACK SURGERY  2008   2009  . BREAST BIOPSY Right 2002   neg  . HEMORRHOID SURGERY    . MOUTH SURGERY  2019  . TONSILLECTOMY      Home Medications:  Allergies as of 08/16/2019      Reactions   Ceftin [cefuroxime]    Cortisone Other (See Comments)   Headache   Fluticasone Swelling   Facial   Levofloxacin Nausea And Vomiting   Meperidine Other (See Comments)   Morphine And Related Other (See Comments)   headache   Morphine Sulfate    Nisoldipine    Penicillin G Potassium In D5w    Prednisone Other (See Comments)   Headache   Sulfa Antibiotics Nausea Only       Medication List       Accurate as of August 16, 2019  9:09 AM. If you have any questions, ask your nurse or doctor.        amLODipine 5 MG tablet Commonly known as: NORVASC Take 1 tablet (5 mg total) by mouth daily.   carvedilol 6.25 MG tablet Commonly known as: COREG Take 1 tablet (6.25 mg total) by mouth 2 (two) times daily with a meal.   cephALEXin 500 MG capsule Commonly known as: KEFLEX Take 1 capsule (500 mg total) by mouth 3 (three) times daily for 7 days.   cephALEXin 500 MG capsule Commonly known as: KEFLEX Take 1 capsule (500 mg total) by mouth 3 (three) times daily for 7 days.   FLUoxetine 10 MG capsule Commonly known as: PROZAC Take 20 mg by mouth daily.   furosemide 20 MG tablet Commonly known as: LASIX Take 1 tablet po QD prn   HYDROcodone-acetaminophen 10-325 MG tablet Commonly known as: NORCO Take 1 tablet by mouth every 4 (four) hours as needed for moderate pain.   LORazepam 0.5 MG tablet Commonly known as: ATIVAN Take 1 tablet (0.5 mg total) by mouth 3 (three) times daily as needed for anxiety.  mirtazapine 30 MG tablet Commonly known as: REMERON Take 1 tablet (30 mg total) by mouth at bedtime.   ondansetron 4 MG tablet Commonly known as: Zofran Take 1 tablet (4 mg total) by mouth every 8 (eight) hours as needed for nausea.   phenazopyridine 100 MG tablet Commonly known as: Pyridium Take 1 tablet (100 mg total) by mouth 3 (three) times daily as needed (bladder pain).   tamsulosin 0.4 MG Caps capsule Commonly known as: FLOMAX Take 1 capsule (0.4 mg total) by mouth daily.       Allergies:  Allergies  Allergen Reactions  . Ceftin [Cefuroxime]   . Cortisone Other (See Comments)    Headache   . Fluticasone Swelling    Facial  . Levofloxacin Nausea And Vomiting  . Meperidine Other (See Comments)  . Morphine And Related Other (See Comments)    headache  . Morphine Sulfate   . Nisoldipine   . Penicillin G Potassium In D5w   .  Prednisone Other (See Comments)    Headache   . Sulfa Antibiotics Nausea Only    Family History: Family History  Problem Relation Age of Onset  . Breast cancer Sister 60  . Breast cancer Maternal Grandmother 20  . Breast cancer Sister 9  . Bladder Cancer Brother   . Hypertension Neg Hx   . Kidney cancer Neg Hx     Social History:  reports that she has never smoked. She has never used smokeless tobacco. She reports that she does not drink alcohol or use drugs.  ROS: UROLOGY Frequent Urination?: Yes Hard to postpone urination?: No Burning/pain with urination?: Yes Get up at night to urinate?: Yes Leakage of urine?: Yes Urine stream starts and stops?: Yes Trouble starting stream?: Yes Do you have to strain to urinate?: No Blood in urine?: No Urinary tract infection?: Yes Sexually transmitted disease?: No Injury to kidneys or bladder?: No Painful intercourse?: No Weak stream?: No Currently pregnant?: No Vaginal bleeding?: No Last menstrual period?: n  Gastrointestinal Nausea?: No Vomiting?: No Indigestion/heartburn?: No Diarrhea?: Yes Constipation?: Yes  Constitutional Fever: No Night sweats?: No Weight loss?: No Fatigue?: No  Skin Skin rash/lesions?: No Itching?: No  Eyes Blurred vision?: Yes Double vision?: Yes  Ears/Nose/Throat Sore throat?: No Sinus problems?: No  Hematologic/Lymphatic Swollen glands?: No Easy bruising?: No  Cardiovascular Leg swelling?: No Chest pain?: No  Respiratory Cough?: No Shortness of breath?: No  Endocrine Excessive thirst?: No  Musculoskeletal Back pain?: No Joint pain?: No  Neurological Headaches?: No Dizziness?: No  Psychologic Depression?: No Anxiety?: No  Physical Exam: BP 115/70   Pulse 76   Ht 5' (1.524 m)   Wt 130 lb (59 kg)   BMI 25.39 kg/m   Constitutional:  Well nourished. Alert and oriented, No acute distress. HEENT: [place] AT, moist mucus membranes.  Trachea midline, no masses.  Cardiovascular: No clubbing, cyanosis, or edema. Respiratory: Normal respiratory effort, no increased work of breathing. GI: Abdomen is soft, non tender, non distended, no abdominal masses. Liver and spleen not palpable.  No hernias appreciated.  Stool sample for occult testing is not indicated.   GU: No CVA tenderness.  No bladder fullness or masses.  Foley in place draining clear yellow urine.   Neurologic: Grossly intact, no focal deficits, moving all 4 extremities. Psychiatric: Normal mood and affect.  Laboratory Data: Lab Results  Component Value Date   WBC 7.5 08/14/2019   HGB 12.2 08/14/2019   HCT 37.9 08/14/2019   MCV 79.6 (L)  08/14/2019   PLT 213 08/14/2019    Lab Results  Component Value Date   CREATININE 0.41 (L) 08/14/2019    No results found for: PSA  No results found for: TESTOSTERONE  No results found for: HGBA1C  Lab Results  Component Value Date   TSH 1.279 06/24/2019    No results found for: CHOL, HDL, CHOLHDL, VLDL, LDLCALC  Lab Results  Component Value Date   AST 17 08/14/2019   Lab Results  Component Value Date   ALT 10 08/14/2019   No components found for: ALKALINEPHOPHATASE No components found for: BILIRUBINTOTAL  No results found for: ESTRADIOL  Urinalysis    Component Value Date/Time   COLORURINE AMBER (A) 08/14/2019 1728   APPEARANCEUR CLEAR (A) 08/14/2019 1728   APPEARANCEUR Clear 10/05/2018 1513   LABSPEC 1.003 (L) 08/14/2019 1728   LABSPEC 1.004 08/05/2014 0115   PHURINE 7.0 08/14/2019 1728   GLUCOSEU NEGATIVE 08/14/2019 1728   GLUCOSEU Negative 08/05/2014 0115   HGBUR NEGATIVE 08/14/2019 1728   BILIRUBINUR NEGATIVE 08/14/2019 1728   BILIRUBINUR Negative 06/26/2019 1322   BILIRUBINUR Negative 10/05/2018 1513   BILIRUBINUR Negative 08/05/2014 0115   KETONESUR NEGATIVE 08/14/2019 1728   PROTEINUR NEGATIVE 08/14/2019 1728   UROBILINOGEN 0.2 06/26/2019 1322   UROBILINOGEN 0.2 03/07/2008 1048   NITRITE POSITIVE (A)  08/14/2019 1728   LEUKOCYTESUR NEGATIVE 08/14/2019 1728   LEUKOCYTESUR Negative 08/05/2014 0115    I have reviewed the labs.   Procedure I let down the balloon and the Foley catheter and repositioned the catheter.  Clear yellow urine continue to drain into the overnight bag.  No blood was seen on exam.  Assessment & Plan:    1.  Urinary retention Patient is scheduled for voiding trial on November 13  2.  Catheter trauma It is possible that patient tugged on catheter and that caused the bleeding last evening.  The catheter is in place at this time and draining clear yellow urine.  Just advised patient and daughter to be mindful of the catheter tubing and the overnight bag position.  No follow-ups on file.  These notes generated with voice recognition software. I apologize for typographical errors.  Zara Council, PA-C  [facility] [address]  [place], [place] [address] [phone]

## 2019-08-17 ENCOUNTER — Ambulatory Visit: Payer: Medicare Other | Admitting: Urology

## 2019-08-20 ENCOUNTER — Ambulatory Visit: Payer: Medicare Other | Admitting: *Deleted

## 2019-08-20 DIAGNOSIS — R413 Other amnesia: Secondary | ICD-10-CM

## 2019-08-20 DIAGNOSIS — F411 Generalized anxiety disorder: Secondary | ICD-10-CM

## 2019-08-20 NOTE — Chronic Care Management (AMB) (Signed)
Chronic Care Management   Follow Up Note   08/20/2019 Name: LEAANN RANCK MRN: [id number] DOB: 10/15/1924  Referred by: Olin Hauser, DO Reason for referral : Chronic Care Management (Care giver support )   MAUD SCARBRO is a 85 y.o. year old female who is a primary care patient of Olin Hauser, DO. The CCM team was consulted for assistance with chronic disease management and care coordination needs.    Review of patient status, including review of consultants reports, relevant laboratory and other test results, and collaboration with appropriate care team members and the patient's provider was performed as part of comprehensive patient evaluation and provision of chronic care management services.    SDOH (Social Determinants of Health) screening performed today: None. See Care Plan for related entries.   Outpatient Encounter Medications as of 08/20/2019  Medication Sig Note  . amLODipine (NORVASC) 5 MG tablet Take 1 tablet (5 mg total) by mouth daily.   . carvedilol (COREG) 6.25 MG tablet Take 1 tablet (6.25 mg total) by mouth 2 (two) times daily with a meal.   . cephALEXin (KEFLEX) 500 MG capsule Take 1 capsule (500 mg total) by mouth 3 (three) times daily for 7 days.   [name] FLUoxetine (PROZAC) 10 MG capsule Take 20 mg by mouth daily.    . furosemide (LASIX) 20 MG tablet Take 1 tablet po QD prn   . HYDROcodone-acetaminophen (NORCO) 10-325 MG tablet Take 1 tablet by mouth every 4 (four) hours as needed for moderate pain.  07/29/2018: Takes fairly regularly   . LORazepam (ATIVAN) 0.5 MG tablet Take 1 tablet (0.5 mg total) by mouth 3 (three) times daily as needed for anxiety.   . mirtazapine (REMERON) 30 MG tablet Take 1 tablet (30 mg total) by mouth at bedtime.   . ondansetron (ZOFRAN) 4 MG tablet Take 1 tablet (4 mg total) by mouth every 8 (eight) hours as needed for nausea.   . phenazopyridine (PYRIDIUM) 100 MG tablet Take 1 tablet (100 mg total) by mouth 3  (three) times daily as needed (bladder pain).   . tamsulosin (FLOMAX) 0.4 MG CAPS capsule Take 1 capsule (0.4 mg total) by mouth daily.    No facility-administered encounter medications on file as of 08/20/2019.      Goals Addressed            This Visit's Progress   . we need help with mom's care (pt-stated)       Current Barriers:  [name] Knowledge Deficits related to community resources to assist with patient's care . Lacks caregiver support.  . Film/video editor.  . Cognitive Deficits . No Advanced Directives in place  Nurse Case Manager Clinical Goal(s):  [name] Over the next 90 days, patient will work with [facility] to address needs related to Caregiver strain.   Interventions: ALL INTERVENTIONS DISCUSSED WITH DAUGHTER-Claudia . Discussed plans with patient for ongoing care management follow up and provided patient with direct contact information for care management team . Provided patient and/or caregiver with verbal  information about palliative care, Project CARES, and Museum/gallery curator (Data processing manager).-Plan to mail resources to [place] who lives in the home.Rosemarie Ax confirms this information was received by her sister but her sister made her aware it was not helpful.  . I discussed briefly with Rosemarie Ax the front page had palliative and respite resources to assist to offset cost of her mothers caregiver needs. Phone call was interrupted by patient needing to go to the restroom so daughter  had to go assist.  . Collaborated with PCP about CCM RN's outreach.   Patient Self Care Activities:  . Calls provider office for new concerns or questions inappropriately at times  . Unable to independently care for self . Does not attend all scheduled provider appointments . Unable to perform IADLs independently . Vision impairment . Hearing impairment  Please see past updates related to this goal by clicking on the "Past Updates" button in the selected goal            The care  management team will reach out to the patient again over the next 30 days.  The patient has been provided with contact information for the care management team and has been advised to call with any health related questions or concerns.    Merlene Morse Barbara Ahart RN, BSN Nurse Case Pharmacist, [facility]  [phone]) Business Mobile

## 2019-08-20 NOTE — Patient Instructions (Addendum)
Thank you allowing the Chronic Care Management Team to be a part of your care! It was a pleasure speaking with you today!   CCM (Chronic Care Management) Team   Marimar Suber RN, BSN Nurse Care Coordinator  [phone]  Harlow Asa PharmD  Clinical Pharmacist  [phone]  Eula Fried LCSW Clinical Social Worker [phone]  Goals Addressed            This Visit's Progress   . we need help with mom's care (pt-stated)       Current Barriers:  [name] Knowledge Deficits related to community resources to assist with patient's care . Lacks caregiver support.  . Film/video editor.  . Cognitive Deficits . No Advanced Directives in place  Nurse Case Manager Clinical Goal(s):  [name] Over the next 90 days, patient will work with [facility] to address needs related to Caregiver strain.   Interventions: ALL INTERVENTIONS DISCUSSED WITH DAUGHTER-Claudia . Discussed plans with patient for ongoing care management follow up and provided patient with direct contact information for care management team . Provided patient and/or caregiver with verbal  information about palliative care, Project CARES, and Museum/gallery curator (Data processing manager).-Plan to mail resources to [place] who lives in the home.Rosemarie Ax confirms this information was received by her sister but her sister made her aware it was not helpful.  . I discussed briefly with Rosemarie Ax the front page had palliative and respite resources to assist to offset cost of her mothers caregiver needs. Phone call was interrupted by patient needing to go to the restroom so daughter had to go assist.  . Collaborated with PCP about CCM RN's outreach.   Patient Self Care Activities:  . Calls provider office for new concerns or questions inappropriately at times  . Unable to independently care for self . Does not attend all scheduled provider appointments . Unable to perform IADLs independently . Vision impairment . Hearing impairment  Please  see past updates related to this goal by clicking on the "Past Updates" button in the selected goal           The patient verbalized understanding of instructions provided today and declined a print copy of patient instruction materials.   The patient has been provided with contact information for the care management team and has been advised to call with any health related questions or concerns.

## 2019-08-24 ENCOUNTER — Ambulatory Visit (INDEPENDENT_AMBULATORY_CARE_PROVIDER_SITE_OTHER): Payer: Medicare Other | Admitting: Physician Assistant

## 2019-08-24 ENCOUNTER — Ambulatory Visit: Payer: Medicare Other | Admitting: Physician Assistant

## 2019-08-24 ENCOUNTER — Ambulatory Visit: Payer: Medicare Other | Admitting: Urology

## 2019-08-24 ENCOUNTER — Other Ambulatory Visit: Payer: Self-pay

## 2019-08-24 VITALS — BP 99/67 | HR 65 | Ht 60.0 in

## 2019-08-24 DIAGNOSIS — R339 Retention of urine, unspecified: Secondary | ICD-10-CM | POA: Diagnosis not present

## 2019-08-24 NOTE — Progress Notes (Signed)
Fill and Pull Catheter Removal  Patient is present today for a catheter removal.  Patient was cleaned and prepped in a sterile fashion 216ml of sterile water was instilled into the bladder when the patient felt the urge to urinate. 53ml of water was then drained from the balloon.  A 16FR foley cath was removed from the bladder no complications were noted .  Patient was then given some time to void on their own.  Patient was unable to urinate on her own after some time.  Patient tolerated well.  Performed by: Debroah Loop, PA-C  Follow up/ Additional notes: Patient accompanied by daughter Rosemarie Ax today. Patient tearful, does not want catheter. Explained it is medically necessary because she cannot empty her bladder. Daughter consented. Discussed indwelling Foley vs. SP tube for long-term management of recurrent urinary retention. Patient and daughter do not want her to have a catheter. Will reattempt voiding trial in 2 weeks; if she fails this, will recommend chronic Foley vs. SPT placement.  Of note, patient's urine culture from her hospitalization earlier this month was negative. Counseled patient's daughter to stop administering prescribed Keflex. Daughter reports her mother seems to be doing better, is not exhibiting infective symptoms.  Simple Catheter Placement  Due to urinary retention patient is present today for a foley cath placement.  Patient was cleaned and prepped in a sterile fashion with betadine.  A 16 FR foley catheter was inserted, urine return was noted 251ml, urine was clear pale yellow in color.  The balloon was filled with 10cc of sterile water.  A night bag was attached for drainage. Patient was given instruction on proper catheter care.  Patient tolerated well, no complications were noted   Performed by: Debroah Loop, PA-C   Additional notes/ Follow up: Return in about 2 weeks (around 09/07/2019) for Voiding trial.  I spent 15 min with this patient, of which  greater than 50% was spent in counseling and coordination of care with the patient.  Debroah Loop, PA-C 08/24/19 10:18 AM

## 2019-08-27 ENCOUNTER — Telehealth: Payer: Self-pay | Admitting: Nurse Practitioner

## 2019-08-27 DIAGNOSIS — F411 Generalized anxiety disorder: Secondary | ICD-10-CM

## 2019-08-27 MED ORDER — FLUOXETINE HCL 20 MG PO CAPS: 20.0000 mg | ORAL_CAPSULE | Freq: Every day | ORAL | 5 refills | Status: DC

## 2019-08-27 NOTE — Telephone Encounter (Addendum)
The pt daughter [name] called requesting that you increase her mama Prozac from 10MG  to 20 MG daily. She state that she discuss her anxiety with you at her last appt. She reports that her mama anxiety have worsen since her last hospital visit. They had to place a foley catheter , because the pt was in urinary retention.  The patient went to Urology and they attempted to remove the catheter, but the patient failed the voiding trial. They placed the catheter back in and will reattempt voiding trial in 2 weeks; if she fails they will discuss long term cath or SPT catheter. The pt daughter [name] state that this ordeal has making her anxiety worse. Please advise

## 2019-08-27 NOTE — Telephone Encounter (Signed)
No problem. You can let them know that I agree and I can increase her Fluoxetine from 10 to 20mg  - new rx sent to [facility]. Start next dose at 20mg  once daily.  [name], [facility] 08/27/2019, 3:48 PM

## 2019-08-27 NOTE — Telephone Encounter (Signed)
I attempted to contact [name] back, no answer. I left a message with Kennyth Lose the caregiver to have either [name] or [name] to return my call.

## 2019-08-28 NOTE — Telephone Encounter (Signed)
I attempted to contact [name] again, no answer.

## 2019-08-29 NOTE — Telephone Encounter (Signed)
The pt daughter was notified. No questions or concerns.

## 2019-08-31 ENCOUNTER — Other Ambulatory Visit: Payer: Self-pay | Admitting: Urology

## 2019-08-31 DIAGNOSIS — R339 Retention of urine, unspecified: Secondary | ICD-10-CM

## 2019-09-03 ENCOUNTER — Telehealth: Payer: Self-pay | Admitting: Physician Assistant

## 2019-09-03 ENCOUNTER — Other Ambulatory Visit: Payer: Self-pay | Admitting: Nurse Practitioner

## 2019-09-03 DIAGNOSIS — F411 Generalized anxiety disorder: Secondary | ICD-10-CM

## 2019-09-03 NOTE — Telephone Encounter (Signed)
Pt's daughter [facility] and would like a call back to discuss cath removal before appt on 09/14/2019 she states that it is hurting pt and would like a sooner appt. Please advise.[name]

## 2019-09-03 NOTE — Telephone Encounter (Signed)
We can move her appointment up earlier next week if she would prefer.

## 2019-09-10 ENCOUNTER — Other Ambulatory Visit: Payer: Self-pay

## 2019-09-10 ENCOUNTER — Ambulatory Visit: Payer: Self-pay | Admitting: Physician Assistant

## 2019-09-10 ENCOUNTER — Ambulatory Visit (INDEPENDENT_AMBULATORY_CARE_PROVIDER_SITE_OTHER): Payer: Medicare Other | Admitting: Physician Assistant

## 2019-09-10 ENCOUNTER — Encounter: Payer: Self-pay | Admitting: Physician Assistant

## 2019-09-10 VITALS — BP 96/63 | HR 90

## 2019-09-10 DIAGNOSIS — R319 Hematuria, unspecified: Secondary | ICD-10-CM | POA: Diagnosis not present

## 2019-09-10 DIAGNOSIS — R82998 Other abnormal findings in urine: Secondary | ICD-10-CM | POA: Diagnosis not present

## 2019-09-10 DIAGNOSIS — R339 Retention of urine, unspecified: Secondary | ICD-10-CM

## 2019-09-10 DIAGNOSIS — R8290 Unspecified abnormal findings in urine: Secondary | ICD-10-CM | POA: Diagnosis not present

## 2019-09-10 LAB — BLADDER SCAN AMB NON-IMAGING: Scan Result: 11 mL

## 2019-09-10 NOTE — Progress Notes (Signed)
Fill and Pull Catheter Removal  Patient is present today for a catheter removal.  Patient was cleaned and prepped in a sterile fashion 163ml of sterile water was instilled into the bladder when the patient felt the urge to urinate. 41ml of water was then drained from the balloon.  A 16FR foley cath was removed from the bladder no complications were noted .  Patient was then given some time to void on their own.  Patient can void 155ml on their own after some time.  Patient tolerated well.  Performed by: Debroah Loop, PA-C   Follow up/ Additional notes: Patient is accompanied by her daughter, Rosemarie Ax, today.  Daughter reports that the patient's catheter clogged earlier this week, requiring manipulation by EMS to allow to continue draining.  Catheter was not replaced associated with this.  On exam, there are collections of keratinaceous-appearing sediment in her overnight bag that has clotted the drainage tube.  Foley catheter is draining at this time.  Sent urine for cytology for further evaluation given unusual appearance of sediment.  Afternoon PVR Results for orders placed or performed in visit on 09/10/19  Bladder Scan (Post Void Residual) in office  Result Value Ref Range   Scan Result 11 mL    PVR WNL this afternoon.  No indication for recatheterization.  Patient reports "it hurts."  I suspect this discomfort is secondary to her recent catheter and not indicative of urinary tract infection.  I advised Rosemarie Ax that I expect she will feel better over the next 1 to 2 days.  If she does not feel better or begins to feel worse, including developing fever, chills, or altered mental status, I advised her to contact the office.  Will defer urine culture today, as I suspect this will be positive secondary to her recent catheterization.  Sent patient home with urine specimen cup and instructions for at home collection if she becomes symptomatic; will culture at that time.

## 2019-09-12 ENCOUNTER — Telehealth: Payer: Self-pay | Admitting: Physician Assistant

## 2019-09-12 ENCOUNTER — Other Ambulatory Visit: Payer: Self-pay

## 2019-09-12 ENCOUNTER — Other Ambulatory Visit: Payer: Self-pay | Admitting: *Deleted

## 2019-09-12 ENCOUNTER — Other Ambulatory Visit: Payer: Medicare Other

## 2019-09-12 DIAGNOSIS — R32 Unspecified urinary incontinence: Secondary | ICD-10-CM | POA: Diagnosis not present

## 2019-09-12 LAB — MICROSCOPIC EXAMINATION: WBC, UA: >30 /hpf — AB (ref 0–5)

## 2019-09-12 LAB — URINALYSIS, COMPLETE
Bilirubin, UA: NEGATIVE
Glucose, UA: NEGATIVE
Ketones, UA: NEGATIVE
Nitrite, UA: NEGATIVE
Protein,UA: NEGATIVE
Specific Gravity, UA: 1.01 (ref 1.005–1.030)
Urobilinogen, Ur: 0.2 mg/dL (ref 0.2–1.0)
pH, UA: 7.5 (ref 5.0–7.5)

## 2019-09-12 NOTE — Telephone Encounter (Signed)
Pt's daughter called to see if the UA results are complete yet.

## 2019-09-13 ENCOUNTER — Other Ambulatory Visit: Payer: Self-pay | Admitting: Physician Assistant

## 2019-09-13 ENCOUNTER — Telehealth: Payer: Self-pay

## 2019-09-13 MED ORDER — CEPHALEXIN 500 MG PO CAPS: 500.0000 mg | ORAL_CAPSULE | Freq: Two times a day (BID) | ORAL | 0 refills | Status: DC

## 2019-09-13 NOTE — Telephone Encounter (Signed)
Pt.'s daughter called to check results and states pt. Is in a lot of pain and seems very confused.

## 2019-09-13 NOTE — Telephone Encounter (Signed)
I just spoke with the patient's daughter, [name].  She reports Ms. Standing has reported suprapubic pain since removal of her urinary catheter earlier this week.  She has had urinary frequency and seems a bit confused today.  [name] believes her mother is emptying her bladder, and states Ms. Coley has a caregiver with her during the day who has been closely monitoring her for signs of urinary retention; the caregiver is not concerned for inadequate emptying at this point.  [name] reports her mother has not had a fever.  Patient has tolerated Keflex well in the past.  Will start her on this today.  I advised [name] that I would contact her if I need to adjust Ms. Rhyner's antibiotic therapy based on the results of her urine culture, sent yesterday.  She expressed understanding.

## 2019-09-14 ENCOUNTER — Ambulatory Visit: Payer: Medicare Other | Admitting: Physician Assistant

## 2019-09-17 ENCOUNTER — Telehealth: Payer: Self-pay | Admitting: Physician Assistant

## 2019-09-17 NOTE — Telephone Encounter (Signed)
Pt's daughter LMOM wanting to know pt's culture results.  She's still hurting and was told to call office on Monday if she wasn't any better.  She is currently taking antibiotics twice per day.  Please call Avon Gully, daughter, at [phone] [place], daughter, [phone].

## 2019-09-18 ENCOUNTER — Telehealth: Payer: Self-pay | Admitting: Physician Assistant

## 2019-09-18 ENCOUNTER — Other Ambulatory Visit: Payer: Self-pay | Admitting: Physician Assistant

## 2019-09-18 LAB — CULTURE, URINE COMPREHENSIVE

## 2019-09-18 MED ORDER — SULFAMETHOXAZOLE-TRIMETHOPRIM 800-160 MG PO TABS: 1.0000 | ORAL_TABLET | Freq: Two times a day (BID) | ORAL | 0 refills | Status: DC

## 2019-09-18 MED ORDER — CIPROFLOXACIN HCL 250 MG PO TABS: 250.0000 mg | ORAL_TABLET | Freq: Two times a day (BID) | ORAL | 0 refills | Status: AC

## 2019-09-18 MED ORDER — CIPROFLOXACIN HCL 250 MG PO TABS: 250.0000 mg | ORAL_TABLET | Freq: Two times a day (BID) | ORAL | 0 refills | Status: DC

## 2019-09-18 MED ORDER — FOSFOMYCIN TROMETHAMINE 3 G PO PACK: 3.0000 g | PACK | Freq: Once | ORAL | 0 refills | Status: DC

## 2019-09-18 NOTE — Telephone Encounter (Signed)
error 

## 2019-09-18 NOTE — Telephone Encounter (Signed)
After my most recent phone note, Rosemarie Ax called back to the office to request fosfomycin instead of Bactrim.  Prescription sent to her pharmacy.  Subsequently, I received an additional phone call from [place], Ms. Schlender's aide, who reports that Ms. Bradshaw is not in fact allergic to ciprofloxacin and would like to take this medication.  I do not see a record of her not tolerating ciprofloxacin in the past.  I am amenable with this plan.  Prescription resent to her pharmacy.  Lastly, I contacted the patient's pharmacy to ask them to disregard all antibiotic prescriptions sent today with the exception of one prescription of ciprofloxacin twice daily x5 days.  They expressed understanding.

## 2019-09-18 NOTE — Telephone Encounter (Signed)
[name], Rosemarie Ax, just called back to clinic to report that Ms. Paszkiewicz has a history of rash and confusion with ciprofloxacin and she cannot take this medication, per patient's other daughter. She requests Keflex. I explained that her most recent urine culture is resistant to Keflex and so this is not an option. Upon review of her listed drug allergies, she reports nausea only associated with Bactrim. Rosemarie Ax is willing to try this medication. I advised her to watch her mother closely for signs of anaphylaxis including rash, swelling, and difficulty breathing, and to discontinue the medication and seek medical attention immediately if these develop. She expressed understanding. If she is unable to complete Bactrim, will prescribe fosfomycin and consider an ID referral.

## 2019-09-18 NOTE — Telephone Encounter (Signed)
I just spoke with the patient's daughter, Rosemarie Ax, via telephone.  I explained that her most recent urine culture just resulted and states that her infection is resistant to the Keflex that she was prescribed.  Patient has taken and tolerated ciprofloxacin well in the recent past, despite a documented levofloxacin allergy.  I will prescribe this today.  Cipro 250 mg twice daily x5 days sent to her pharmacy.  I advised her to stop the Keflex and switch to this new medication today.  She expressed understanding.

## 2019-09-24 ENCOUNTER — Emergency Department: Payer: Medicare Other

## 2019-09-24 ENCOUNTER — Encounter: Payer: Self-pay | Admitting: Emergency Medicine

## 2019-09-24 ENCOUNTER — Telehealth: Payer: Self-pay

## 2019-09-24 ENCOUNTER — Emergency Department
Admission: EM | Admit: 2019-09-24 | Discharge: 2019-09-24 | Disposition: A | Discharge status: Home / Self Care | Payer: Medicare Other | Attending: Emergency Medicine | Admitting: Emergency Medicine

## 2019-09-24 ENCOUNTER — Other Ambulatory Visit: Payer: Self-pay

## 2019-09-24 DIAGNOSIS — H6692 Otitis media, unspecified, left ear: Secondary | ICD-10-CM | POA: Insufficient documentation

## 2019-09-24 DIAGNOSIS — H9202 Otalgia, left ear: Secondary | ICD-10-CM | POA: Diagnosis present

## 2019-09-24 DIAGNOSIS — I10 Essential (primary) hypertension: Secondary | ICD-10-CM | POA: Insufficient documentation

## 2019-09-24 DIAGNOSIS — W19XXXA Unspecified fall, initial encounter: Secondary | ICD-10-CM

## 2019-09-24 DIAGNOSIS — Z79899 Other long term (current) drug therapy: Secondary | ICD-10-CM | POA: Diagnosis not present

## 2019-09-24 DIAGNOSIS — R600 Localized edema: Secondary | ICD-10-CM

## 2019-09-24 DIAGNOSIS — R2243 Localized swelling, mass and lump, lower limb, bilateral: Secondary | ICD-10-CM | POA: Insufficient documentation

## 2019-09-24 DIAGNOSIS — H6690 Otitis media, unspecified, unspecified ear: Secondary | ICD-10-CM

## 2019-09-24 LAB — BASIC METABOLIC PANEL
Anion gap: 11 (ref 5–15)
BUN: 9 mg/dL (ref 8–23)
CO2: 27 mmol/L (ref 22–32)
Calcium: 9.8 mg/dL (ref 8.9–10.3)
Chloride: 94 mmol/L — ABNORMAL LOW (ref 98–111)
Creatinine, Ser: 0.61 mg/dL (ref 0.44–1.00)
GFR calc Af Amer: >60 mL/min (ref >60)
GFR calc non Af Amer: >60 mL/min (ref >60)
Glucose, Bld: 101 mg/dL — ABNORMAL HIGH (ref 70–99)
Potassium: 3.6 mmol/L (ref 3.5–5.1)
Sodium: 132 mmol/L — ABNORMAL LOW (ref 135–145)

## 2019-09-24 LAB — URINALYSIS, COMPLETE (UACMP) WITH MICROSCOPIC
Bacteria, UA: NONE SEEN
Bilirubin Urine: NEGATIVE
Glucose, UA: NEGATIVE mg/dL
Hgb urine dipstick: NEGATIVE
Ketones, ur: NEGATIVE mg/dL
Leukocytes,Ua: NEGATIVE
Nitrite: NEGATIVE
Protein, ur: NEGATIVE mg/dL
Specific Gravity, Urine: 1.006 (ref 1.005–1.030)
Squamous Epithelial / HPF: NONE SEEN (ref 0–5)
pH: 8 (ref 5.0–8.0)

## 2019-09-24 LAB — CBC
HCT: 35.4 % — ABNORMAL LOW (ref 36.0–46.0)
Hemoglobin: 11.8 g/dL — ABNORMAL LOW (ref 12.0–15.0)
MCH: 25.3 pg — ABNORMAL LOW (ref 26.0–34.0)
MCHC: 33.3 g/dL (ref 30.0–36.0)
MCV: 76 fL — ABNORMAL LOW (ref 80.0–100.0)
Platelets: 215 10*3/uL (ref 150–400)
RBC: 4.66 MIL/uL (ref 3.87–5.11)
RDW: 15.9 % — ABNORMAL HIGH (ref 11.5–15.5)
WBC: 6.3 10*3/uL (ref 4.0–10.5)
nRBC: 0 % (ref 0.0–0.2)

## 2019-09-24 LAB — TROPONIN I (HIGH SENSITIVITY): Troponin I (High Sensitivity): 7 ng/L (ref <18)

## 2019-09-24 LAB — BRAIN NATRIURETIC PEPTIDE: B Natriuretic Peptide: 213 pg/mL — ABNORMAL HIGH (ref 0.0–100.0)

## 2019-09-24 MED ORDER — AMOXICILLIN 500 MG PO CAPS: 500.0000 mg | ORAL_CAPSULE | Freq: Two times a day (BID) | ORAL | 0 refills | Status: AC

## 2019-09-24 MED ORDER — SODIUM CHLORIDE 0.9% FLUSH: 3.0000 mL | Freq: Once | INTRAVENOUS | Status: DC

## 2019-09-24 NOTE — ED Notes (Signed)
X-ray at bedside

## 2019-09-24 NOTE — Telephone Encounter (Signed)
Pt daughter called complaining of bilateral lower extremities edema w/ weeping from the leg. The daughter states that she have a open wound on one of her legs about the size of a quarter that is weeping. She states that her legs are so swollen that its heavy to even try to help the patient lift them. The pt is also complaining of a feeling of congestion, but the daughter denies the SOB. I recommended that the patient be evaluated face to face at a urgent care of the emergency room.  The daughter verbalize understanding.

## 2019-09-24 NOTE — ED Provider Notes (Signed)
[facility] Emergency Department Provider Note  ____________________________________________   First MD Initiated Contact with Patient 09/24/19 1726     (approximate)  I have reviewed the triage vital signs and the nursing notes.   HISTORY  Chief Complaint Urinary retention.   HPI [name] is a 85 y.o. female with hypertension, edema on as needed Lasix who comes in with concerns for possible UTI.  Patient presents with daughter.  Patient was recently treated for UTI and antibiotics stopped today.  She was on ciprofloxacin.  She was just wanted to make sure that there was no signs of recurrent infection or sepsis.  They had a Foley but it has been removed and she still seems to be urinating.  She states that she has had a little bit of bladder discomfort but otherwise no abdominal pain or back pain.  She also has some left ear pain which she is had chronic otitis media in.  Denies any redness or swelling around the ear.  He also noticed some increasing swelling in her legs.  They have noted that they restarted her Lasix a few days ago but that she had not been taking it.  They want to make sure that there was no signs of fluids inside her lungs.          Past Medical History:  Diagnosis Date  . Allergy    environmental  . Anxiety   . Chronic back pain   . High cholesterol   . History of urinary retention   . Hypertension   . Migraines   . Osteoporosis   . Pneumonia   . Sinusitis     Patient Active Problem List   Diagnosis Date Noted  . Macular degeneration 09/04/2018  . Cervical lymphadenopathy 07/18/2018  . Pain in right hip 07/18/2018  . Lymphadenopathy 06/25/2018  . Stomatitis and mucositis 06/25/2018  . Shortness of breath 02/16/2018  . Arm fracture, left 01/03/2018  . Arthritis 01/03/2018  . Compression fracture of thoracic vertebra ([place]) 01/03/2018  . Emphysema lung ([place]) 01/03/2018  . GERD (gastroesophageal reflux disease)  01/03/2018  . GI bleeding 01/03/2018  . Lumbar spondylosis 01/03/2018  . Menstrual problem 01/03/2018  . Osteoarthritis of knee 01/03/2018  . Osteoporosis 01/03/2018  . Acute pain of right knee 01/03/2018  . Synovial cyst of popliteal space 01/03/2018  . History of urinary retention 11/10/2017  . Generalized anxiety disorder 10/24/2017  . Constipation 10/24/2017  . Internal hemorrhoids 10/24/2017  . Essential (primary) hypertension 10/24/2017  . Thrombocytopenia ([place]) 10/24/2017  . Acute encephalopathy 08/04/2017  . Hyponatremia 12/15/2015    Past Surgical History:  Procedure Laterality Date  . ABDOMINAL HYSTERECTOMY    . APPENDECTOMY    . BACK SURGERY    . BACK SURGERY  2008   2009  . BREAST BIOPSY Right 2002   neg  . HEMORRHOID SURGERY    . MOUTH SURGERY  2019  . TONSILLECTOMY      Prior to Admission medications   Medication Sig Start Date End Date Taking? Authorizing Provider  amLODipine (NORVASC) 5 MG tablet Take 1 tablet (5 mg total) by mouth daily. 01/19/19   Mikey College, NP  carvedilol (COREG) 6.25 MG tablet Take 1 tablet (6.25 mg total) by mouth 2 (two) times daily with a meal. 08/13/19   Karamalegos, [name], DO  FLUoxetine (PROZAC) 20 MG capsule Take 1 capsule (20 mg total) by mouth daily. 08/27/19   Olin Hauser, DO  furosemide (  LASIX) 20 MG tablet Take 1 tablet po QD prn 01/19/19   Mikey College, NP  HYDROcodone-acetaminophen [facility]) 10-325 MG tablet Take 1 tablet by mouth every 4 (four) hours as needed for moderate pain.     [provider]  LORazepam (ATIVAN) 0.5 MG tablet Take 1 tablet (0.5 mg total) by mouth 3 (three) times daily as needed for anxiety. 07/19/19   Mikey College, NP  mirtazapine (REMERON) 30 MG tablet Take 1 tablet (30 mg total) by mouth at bedtime. 09/03/19   Karamalegos, [name], DO  ondansetron (ZOFRAN) 4 MG tablet Take 1 tablet (4 mg total) by mouth every 8 (eight) hours as needed for  nausea. 06/15/19   Mikey College, NP  phenazopyridine (PYRIDIUM) 100 MG tablet Take 1 tablet (100 mg total) by mouth 3 (three) times daily as needed (bladder pain). 07/12/19   Mikey College, NP  tamsulosin (FLOMAX) 0.4 MG CAPS capsule Take 1 capsule (0.4 mg total) by mouth daily. 08/31/19   Debroah Loop, PA-C    Allergies Ceftin [cefuroxime], Cortisone, Fluticasone, Levofloxacin, Meperidine, Morphine and related, Morphine sulfate, Nisoldipine, Penicillin g potassium in d5w, Prednisone, and Sulfa antibiotics  Family History  Problem Relation Age of Onset  . Breast cancer Sister 96  . Breast cancer Maternal Grandmother 76  . Breast cancer Sister 3  . Bladder Cancer Brother   . Hypertension Neg Hx   . Kidney cancer Neg Hx     Social History Social History   Tobacco Use  . Smoking status: Never Smoker  . Smokeless tobacco: Never Used  Substance Use Topics  . Alcohol use: No  . Drug use: No      Review of Systems Constitutional: No fever/chills Eyes: No visual changes. ENT: No sore throat.  Positive left ear pain Cardiovascular: Denies chest pain. Respiratory: Denies shortness of breath. Gastrointestinal: No abdominal pain.  No nausea, no vomiting.  No diarrhea.  No constipation. Genitourinary: Negative for dysuria.  Positive concern for UTI Musculoskeletal: Negative for back pain. Skin: Negative for rash. Neurological: Negative for headaches, focal weakness or numbness. All other ROS negative ____________________________________________   PHYSICAL EXAM:  VITAL SIGNS: ED Triage Vitals  Enc Vitals Group     BP 09/24/19 1602 140/83     Pulse Rate 09/24/19 1602 86     Resp 09/24/19 1613 18     Temp 09/24/19 1602 98.6 F (37 C)     Temp Source 09/24/19 1602 Oral     SpO2 09/24/19 1602 94 %     Weight 09/24/19 1603 125 lb (56.7 kg)     Height 09/24/19 1603 5\' 2"  (1.575 m)     Head Circumference --      Peak Flow --      Pain Score 09/24/19  1608 0     Pain Loc --      Pain Edu? --      Excl. in [place]? --     Constitutional: Alert and oriented. Well appearing and in no acute distress.  Hard of hearing Eyes: Conjunctivae are normal. EOMI. Head: Atraumatic.  Difficult to see left TM due to some earwax but no erythema or redness around the outside of the ear. Nose: No congestion/rhinnorhea. Mouth/Throat: Mucous membranes are moist.   Neck: No stridor. Trachea Midline. FROM Cardiovascular: Normal rate, regular rhythm. Grossly normal heart sounds.  Good peripheral circulation. Respiratory: Normal respiratory effort.  No retractions. Lungs CTAB. Gastrointestinal: Soft and nontender. No distention. No abdominal bruits.  Musculoskeletal: No lower extremity tenderness nor edema.  No joint effusions.  Slightly tender on the left hip and left ankle. Neurologic:  Normal speech and language. No gross focal neurologic deficits are appreciated.  Skin:  Skin is warm, dry and intact. No rash noted. Psychiatric: Mood and affect are normal. Speech and behavior are normal. GU: Deferred   ____________________________________________   LABS (all labs ordered are listed, but only abnormal results are displayed)  Labs Reviewed  BASIC METABOLIC PANEL - Abnormal; Notable for the following components:      Result Value   Sodium 132 (*)    Chloride 94 (*)    Glucose, Bld 101 (*)    All other components within normal limits  CBC - Abnormal; Notable for the following components:   Hemoglobin 11.8 (*)    HCT 35.4 (*)    MCV 76.0 (*)    MCH 25.3 (*)    RDW 15.9 (*)    All other components within normal limits  URINALYSIS, COMPLETE (UACMP) WITH MICROSCOPIC - Abnormal; Notable for the following components:   Color, Urine YELLOW (*)    APPearance HAZY (*)    All other components within normal limits  BRAIN NATRIURETIC PEPTIDE - Abnormal; Notable for the following components:   B Natriuretic Peptide 213.0 (*)    All other components within normal  limits  TROPONIN I (HIGH SENSITIVITY)  TROPONIN I (HIGH SENSITIVITY)   ____________________________________________   ED ECG REPORT I, Vanessa [place], the attending physician, personally viewed and interpreted this ECG.  EKG is normal sinus rate of 79, no ST elevation, T inversion in V2 and V3, type I AV block.  Looks similar to prior EKG ____________________________________________  RADIOLOGY Robert Bellow, personally viewed and evaluated these images (plain radiographs) as part of my medical decision making, as well as reviewing the written report by the radiologist.  ED MD interpretation: No pneumonia  Official radiology report(s): DG Chest 2 View  Result Date: 09/24/2019 CLINICAL DATA:  Shortness of breath EXAM: CHEST - 2 VIEW COMPARISON:  August 12, 2018 FINDINGS: There is mild scarring in the right base. There is no edema or consolidation. Heart is mildly enlarged with pulmonary vascularity normal. Aorta is tortuous, stable. No adenopathy evident. There is anterior wedging of a midthoracic vertebral body, stable. Bones are osteoporotic. IMPRESSION: No edema or consolidation. Stable cardiac prominence. Stable aortic tortuosity. Bones osteoporotic with stable midthoracic vertebral body compression fracture. Electronically Signed   By: Lowella Grip III M.D.   On: 09/24/2019 16:48   DG Knee 1-2 Views Left  Result Date: 09/24/2019 CLINICAL DATA:  Fall several months ago.  Continued pain EXAM: LEFT KNEE - 1-2 VIEW COMPARISON:  None. FINDINGS: No acute bony abnormality. Specifically, no fracture, subluxation, or dislocation. Early joint space narrowing and spurring. No joint effusion. IMPRESSION: No acute bony abnormality. Electronically Signed   By: Rolm Baptise M.D.   On: 09/24/2019 18:21   DG Ankle Complete Left  Result Date: 09/24/2019 CLINICAL DATA:  Fall several months ago.  Continued pain. EXAM: LEFT ANKLE COMPLETE - 3+ VIEW COMPARISON:  None. FINDINGS: No acute bony  abnormality. Specifically, no fracture, subluxation, or dislocation. Joint spaces maintained. Soft tissues are intact. IMPRESSION: No acute bony abnormality. Electronically Signed   By: Rolm Baptise M.D.   On: 09/24/2019 18:21   DG HIP UNILAT WITH PELVIS 2-3 VIEWS LEFT  Result Date: 09/24/2019 CLINICAL DATA:  Fall a few months ago.  Continued pain EXAM: DG HIP (  WITH OR WITHOUT PELVIS) 2-3V LEFT COMPARISON:  None. FINDINGS: Hip joints and SI joints are symmetric. Postoperative changes from posterior fusion in the lower lumbar spine. No acute bony abnormality. Specifically, no fracture, subluxation, or dislocation. IMPRESSION: No acute bony abnormality. Electronically Signed   By: Rolm Baptise M.D.   On: 09/24/2019 18:22    ____________________________________________   PROCEDURES  Procedure(s) performed (including Critical Care):  Procedures   ____________________________________________   INITIAL IMPRESSION / ASSESSMENT AND PLAN / ED COURSE  ELVY LISHMAN was evaluated in Emergency Department on 09/24/2019 for the symptoms described in the history of present illness. She was evaluated in the context of the global COVID-19 pandemic, which necessitated consideration that the patient might be at risk for infection with the SARS-CoV-2 virus that causes COVID-19. Institutional protocols and algorithms that pertain to the evaluation of patients at risk for COVID-19 are in a state of rapid change based on information released by regulatory bodies including the CDC and federal and state organizations. These policies and algorithms were followed during the patient's care in the ED.    Patient is a well-appearing 85 year old who comes in with discrete UTI by daughter.  Will get urine to evaluate for UTI.  Will get labs to evaluate for AKI, electrolyte abnormalities.  Will get some x-rays given some tenderness on her left leg after a fall few months ago although it sounds if she was able to ambulate  to the bathroom with nurses.  Patient does have some bilateral leg swelling with a chest x-ray to make sure is no evidence of pulmonary edema.  Most likely patient has a swelling secondary to being off of her Lasix.  For the left ear infection difficult to tell due to some earwax however family says that she normally is on amoxicillin and given it gets better.   No evidence of UTi. Labs re-assuring Hgb around baseline at 11.8.  X-rays are negative.  Bladder scan was around 300 cc.  We discussed about reinserting Foley but family says that she already feels that she has to urinate again and he would like to try to hold off on Foley at this time.  The understand that if she stops urinating that she needs to return to the ER immediately.  Encourage patient to restart her Lasix and given a short course of amoxicillin since they sayy that the 500mg  works the best for possible ear infection should follow-up with ENT.  I discussed the provisional nature of ED diagnosis, the treatment so far, the ongoing plan of care, follow up appointments and return precautions with the patient and any family or support people present. They expressed understanding and agreed with the plan, discharged home.  ____________________________________________   FINAL CLINICAL IMPRESSION(S) / ED DIAGNOSES   Final diagnoses:  Acute otitis media, unspecified otitis media type  Edema of both legs      MEDICATIONS GIVEN DURING THIS VISIT:  Medications  sodium chloride flush (NS) 0.9 % injection 3 mL ( Intravenous Canceled Entry 09/24/19 1714)     ED Discharge Orders         Ordered    amoxicillin (AMOXIL) 500 MG capsule  2 times daily     09/24/19 1918           Note:  This document was prepared using Dragon voice recognition software and may include unintentional dictation errors.   Vanessa [place], MD 09/24/19 [place]

## 2019-09-24 NOTE — Discharge Instructions (Addendum)
F/u with PCP for leg swelling check. Restart the lasix.   F/u with ENT for the ear pain.  Return to ER for any other concerns.

## 2019-09-24 NOTE — ED Notes (Addendum)
Bladder scanner showing 315ml post residual volume, dr Jari Pigg notified

## 2019-09-24 NOTE — Telephone Encounter (Signed)
Patient triaged. Should seek care more urgently for LE edema and wound with oozing, congestion.  Charlene Putnam, DO [facility] 09/24/2019, 4:18 PM

## 2019-09-24 NOTE — ED Triage Notes (Signed)
Patient states she fell a few months ago and just hasn't gotten better.  States she had a UTI and took antibiotics, but still doesn't feel better.  States she is feeling SOB and sore throat today.  Patient is AAOx3.  Skin warm and dry.  Sleeping in quick naps.

## 2019-09-24 NOTE — ED Triage Notes (Signed)
Pt in via EMS from home with c/o unable to urinate for several days. Weeping in [place].. BP 152/81, HR 82, 95%RA

## 2019-09-25 ENCOUNTER — Telehealth: Payer: Self-pay | Admitting: Family Medicine

## 2019-09-25 NOTE — Chronic Care Management (AMB) (Signed)
  Chronic Care Management   Outreach Note  09/25/2019 Name: [name] MRN: [id number] DOB: 1925/05/24  Referred by: Olin Hauser, DO Reason for referral : Chronic Care Management (CCM R/S outreach was unsuccessful. )   An unsuccessful telephone outreach was attempted today to reschedule appointment with CCM RN on 09/24/2019. The patient was referred to the case management team by for assistance with care management and care coordination.   Follow Up Plan: A HIPPA compliant phone message was left for the patient providing contact information and requesting a return call.  The care management team will reach out to the patient again over the next 7 days.  If patient returns call to provider office, please advise to call [place] at [phone].  [place], [place] [address] Direct Dial: [email or web address]  Website: [email or web address]

## 2019-09-28 ENCOUNTER — Ambulatory Visit: Payer: Medicare Other | Admitting: Physician Assistant

## 2019-09-29 ENCOUNTER — Emergency Department
Admission: EM | Admit: 2019-09-29 | Discharge: 2019-09-29 | Disposition: A | Discharge status: Home / Self Care | Payer: Medicare Other | Attending: Student in an Organized Health Care Education/Training Program | Admitting: Student in an Organized Health Care Education/Training Program

## 2019-09-29 ENCOUNTER — Encounter: Payer: Self-pay | Admitting: Emergency Medicine

## 2019-09-29 ENCOUNTER — Emergency Department: Payer: Medicare Other

## 2019-09-29 ENCOUNTER — Other Ambulatory Visit: Payer: Self-pay

## 2019-09-29 DIAGNOSIS — Z79899 Other long term (current) drug therapy: Secondary | ICD-10-CM | POA: Insufficient documentation

## 2019-09-29 DIAGNOSIS — T782XXA Anaphylactic shock, unspecified, initial encounter: Secondary | ICD-10-CM | POA: Diagnosis not present

## 2019-09-29 DIAGNOSIS — R21 Rash and other nonspecific skin eruption: Secondary | ICD-10-CM | POA: Diagnosis present

## 2019-09-29 DIAGNOSIS — I10 Essential (primary) hypertension: Secondary | ICD-10-CM | POA: Diagnosis not present

## 2019-09-29 LAB — CBC WITH DIFFERENTIAL/PLATELET
Abs Immature Granulocytes: 0.04 10*3/uL (ref 0.00–0.07)
Basophils Absolute: 0 10*3/uL (ref 0.0–0.1)
Basophils Relative: 0 %
Eosinophils Absolute: 0 10*3/uL (ref 0.0–0.5)
Eosinophils Relative: 0 %
HCT: 34.8 % — ABNORMAL LOW (ref 36.0–46.0)
Hemoglobin: 11.4 g/dL — ABNORMAL LOW (ref 12.0–15.0)
Immature Granulocytes: 0 %
Lymphocytes Relative: 11 %
Lymphs Abs: 1.1 10*3/uL (ref 0.7–4.0)
MCH: 26 pg (ref 26.0–34.0)
MCHC: 32.8 g/dL (ref 30.0–36.0)
MCV: 79.3 fL — ABNORMAL LOW (ref 80.0–100.0)
Monocytes Absolute: 0.2 10*3/uL (ref 0.1–1.0)
Monocytes Relative: 2 %
Neutro Abs: 8.6 10*3/uL — ABNORMAL HIGH (ref 1.7–7.7)
Neutrophils Relative %: 87 %
Platelets: 191 10*3/uL (ref 150–400)
RBC: 4.39 MIL/uL (ref 3.87–5.11)
RDW: 15.7 % — ABNORMAL HIGH (ref 11.5–15.5)
WBC: 10.1 10*3/uL (ref 4.0–10.5)
nRBC: 0 % (ref 0.0–0.2)

## 2019-09-29 LAB — COMPREHENSIVE METABOLIC PANEL
ALT: 12 U/L (ref 0–44)
AST: 24 U/L (ref 15–41)
Albumin: 3.6 g/dL (ref 3.5–5.0)
Alkaline Phosphatase: 60 U/L (ref 38–126)
Anion gap: 10 (ref 5–15)
BUN: 16 mg/dL (ref 8–23)
CO2: 24 mmol/L (ref 22–32)
Calcium: 9.5 mg/dL (ref 8.9–10.3)
Chloride: 94 mmol/L — ABNORMAL LOW (ref 98–111)
Creatinine, Ser: 0.76 mg/dL (ref 0.44–1.00)
GFR calc Af Amer: >60 mL/min (ref >60)
GFR calc non Af Amer: >60 mL/min (ref >60)
Glucose, Bld: 110 mg/dL — ABNORMAL HIGH (ref 70–99)
Potassium: 3.5 mmol/L (ref 3.5–5.1)
Sodium: 128 mmol/L — ABNORMAL LOW (ref 135–145)
Total Bilirubin: 1.6 mg/dL — ABNORMAL HIGH (ref 0.3–1.2)
Total Protein: 6.1 g/dL — ABNORMAL LOW (ref 6.5–8.1)

## 2019-09-29 MED ORDER — DIPHENHYDRAMINE HCL 50 MG/ML IJ SOLN
INTRAMUSCULAR | Status: AC
  Administered 2019-09-29: 12.5 mg via INTRAVENOUS
  Filled 2019-09-29: qty 1

## 2019-09-29 MED ORDER — DIPHENHYDRAMINE HCL 50 MG/ML IJ SOLN
INTRAMUSCULAR | Status: AC
  Filled 2019-09-29: qty 1

## 2019-09-29 MED ORDER — METHYLPREDNISOLONE 4 MG PO TBPK: ORAL_TABLET | ORAL | 0 refills | Status: DC

## 2019-09-29 MED ORDER — DIPHENHYDRAMINE HCL 25 MG PO TABS: 25.0000 mg | ORAL_TABLET | Freq: Four times a day (QID) | ORAL | 0 refills | Status: DC | PRN

## 2019-09-29 MED ORDER — EPINEPHRINE 0.3 MG/0.3ML IJ SOAJ: 0.3000 mg | Freq: Once | INTRAMUSCULAR | Status: AC

## 2019-09-29 MED ORDER — EPINEPHRINE 0.3 MG/0.3ML IJ SOAJ: 0.3000 mg | Freq: Once | INTRAMUSCULAR | 1 refills | Status: AC

## 2019-09-29 MED ORDER — HYDROCODONE-ACETAMINOPHEN 10-325 MG PO TABS
1.0000 | ORAL_TABLET | ORAL | Status: AC
  Administered 2019-09-29: 1 via ORAL
  Filled 2019-09-29: qty 1

## 2019-09-29 MED ORDER — METHYLPREDNISOLONE SODIUM SUCC 125 MG IJ SOLR
INTRAMUSCULAR | Status: AC
  Filled 2019-09-29: qty 2

## 2019-09-29 MED ORDER — FAMOTIDINE IN NACL 20-0.9 MG/50ML-% IV SOLN
20.0000 mg | Freq: Once | INTRAVENOUS | Status: AC
  Administered 2019-09-29: 20 mg via INTRAVENOUS

## 2019-09-29 MED ORDER — DIPHENHYDRAMINE HCL 50 MG/ML IJ SOLN: 12.5000 mg | Freq: Once | INTRAMUSCULAR | Status: AC

## 2019-09-29 MED ORDER — METHYLPREDNISOLONE SODIUM SUCC 125 MG IJ SOLR
125.0000 mg | Freq: Once | INTRAMUSCULAR | Status: AC
  Administered 2019-09-29: 125 mg via INTRAVENOUS

## 2019-09-29 MED ORDER — EPINEPHRINE 0.3 MG/0.3ML IJ SOAJ
INTRAMUSCULAR | Status: AC
  Administered 2019-09-29: 0.3 mg via INTRAMUSCULAR
  Filled 2019-09-29: qty 0.3

## 2019-09-29 MED ORDER — DIPHENHYDRAMINE HCL 50 MG/ML IJ SOLN
12.5000 mg | Freq: Once | INTRAMUSCULAR | Status: AC
  Administered 2019-09-29: 12.5 mg via INTRAVENOUS

## 2019-09-29 MED ORDER — PREDNISONE 20 MG PO TABS: 40.0000 mg | ORAL_TABLET | Freq: Every day | ORAL | 0 refills | Status: DC

## 2019-09-29 NOTE — ED Notes (Signed)
Pt with daughter at bedside reports allergic to prednisolone - causes HA for 3 days

## 2019-09-29 NOTE — ED Notes (Signed)
Peripheral IV discontinued. Catheter intact. No signs of infiltration or redness. Gauze applied to IV site.   Discharge instructions reviewed with patient. Questions fielded by this RN. Patient verbalizes understanding of instructions. Patient discharged home in stable condition per stafford. No acute distress noted at time of discharge.     Pt wheeled to car and loaded

## 2019-09-29 NOTE — ED Provider Notes (Signed)
[facility] Emergency Department Provider Note    First MD Initiated Contact with Patient 09/29/19 1336     (approximate)  I have reviewed the triage vital signs and the nursing notes.   HISTORY  Chief Complaint Rash    HPI [name] is a 85 y.o. female extensive history of allergies presents the ER after recently started amoxicillin with diffuse itching rash and lip swelling trouble swallowing.  Started initially having some the symptoms yesterday but the diffuse rash and urticaria popped up this morning and rapidly progressed.  She not having any fevers.  No sores in her mouth.  Does feel like she needs to clear her throat.    Past Medical History:  Diagnosis Date  . Allergy    environmental  . Anxiety   . Chronic back pain   . High cholesterol   . History of urinary retention   . Hypertension   . Migraines   . Osteoporosis   . Pneumonia   . Sinusitis    Family History  Problem Relation Age of Onset  . Breast cancer Sister 5  . Breast cancer Maternal Grandmother 48  . Breast cancer Sister 25  . Bladder Cancer Brother   . Hypertension Neg Hx   . Kidney cancer Neg Hx    Past Surgical History:  Procedure Laterality Date  . ABDOMINAL HYSTERECTOMY    . APPENDECTOMY    . BACK SURGERY    . BACK SURGERY  2008   2009  . BREAST BIOPSY Right 2002   neg  . HEMORRHOID SURGERY    . MOUTH SURGERY  2019  . TONSILLECTOMY     Patient Active Problem List   Diagnosis Date Noted  . Macular degeneration 09/04/2018  . Cervical lymphadenopathy 07/18/2018  . Pain in right hip 07/18/2018  . Lymphadenopathy 06/25/2018  . Stomatitis and mucositis 06/25/2018  . Shortness of breath 02/16/2018  . Arm fracture, left 01/03/2018  . Arthritis 01/03/2018  . Compression fracture of thoracic vertebra ([place]) 01/03/2018  . Emphysema lung ([place]) 01/03/2018  . GERD (gastroesophageal reflux disease) 01/03/2018  . GI bleeding 01/03/2018  . Lumbar  spondylosis 01/03/2018  . Menstrual problem 01/03/2018  . Osteoarthritis of knee 01/03/2018  . Osteoporosis 01/03/2018  . Acute pain of right knee 01/03/2018  . Synovial cyst of popliteal space 01/03/2018  . History of urinary retention 11/10/2017  . Generalized anxiety disorder 10/24/2017  . Constipation 10/24/2017  . Internal hemorrhoids 10/24/2017  . Essential (primary) hypertension 10/24/2017  . Thrombocytopenia ([place]) 10/24/2017  . Acute encephalopathy 08/04/2017  . Hyponatremia 12/15/2015      Prior to Admission medications   Medication Sig Start Date End Date Taking? Authorizing Provider  amLODipine (NORVASC) 5 MG tablet Take 1 tablet (5 mg total) by mouth daily. 01/19/19   Mikey College, NP  amoxicillin (AMOXIL) 500 MG capsule Take 1 capsule (500 mg total) by mouth 2 (two) times daily for 10 days. 09/24/19 10/04/19  Vanessa Lewiston, MD  carvedilol (COREG) 6.25 MG tablet Take 1 tablet (6.25 mg total) by mouth 2 (two) times daily with a meal. 08/13/19   Karamalegos, [name], DO  FLUoxetine (PROZAC) 20 MG capsule Take 1 capsule (20 mg total) by mouth daily. 08/27/19   Parks Ranger, [name], DO  furosemide (LASIX) 20 MG tablet Take 1 tablet po QD prn 01/19/19   Mikey College, NP  HYDROcodone-acetaminophen [facility]) 10-325 MG tablet Take 1 tablet by mouth every 4 (four) hours  as needed for moderate pain.     [provider]  LORazepam (ATIVAN) 0.5 MG tablet Take 1 tablet (0.5 mg total) by mouth 3 (three) times daily as needed for anxiety. 07/19/19   Mikey College, NP  mirtazapine (REMERON) 30 MG tablet Take 1 tablet (30 mg total) by mouth at bedtime. 09/03/19   Karamalegos, [name], DO  ondansetron (ZOFRAN) 4 MG tablet Take 1 tablet (4 mg total) by mouth every 8 (eight) hours as needed for nausea. 06/15/19   Mikey College, NP  phenazopyridine (PYRIDIUM) 100 MG tablet Take 1 tablet (100 mg total) by mouth 3 (three) times daily as needed (bladder  pain). 07/12/19   Mikey College, NP  tamsulosin (FLOMAX) 0.4 MG CAPS capsule Take 1 capsule (0.4 mg total) by mouth daily. 08/31/19   Debroah Loop, PA-C    Allergies Ceftin [cefuroxime], Cortisone, Fluticasone, Levofloxacin, Meperidine, Morphine and related, Morphine sulfate, Nisoldipine, Penicillin g potassium in d5w, Prednisone, Sulfa antibiotics, and Ciprofloxacin    Social History Social History   Tobacco Use  . Smoking status: Never Smoker  . Smokeless tobacco: Never Used  Substance Use Topics  . Alcohol use: No  . Drug use: No    Review of Systems Patient denies headaches, rhinorrhea, blurry vision, numbness, shortness of breath, chest pain, edema, cough, abdominal pain, nausea, vomiting, diarrhea, dysuria, fevers, rashes or hallucinations unless otherwise stated above in HPI. ____________________________________________   PHYSICAL EXAM:  VITAL SIGNS: Vitals:   09/29/19 1331 09/29/19 1430  BP: (!) 91/43 (!) 112/44  Pulse:  76  Resp:  18  Temp:    SpO2:  99%    Constitutional: Alert and oriented. Very HOH Eyes: Conjunctivae are normal.  Head: Atraumatic. Nose: No congestion/rhinnorhea. Mouth/Throat: Mucous membranes are moist.  Upper lip angioedema, no uvular swelling or edema Neck: No stridor. Painless ROM.  Cardiovascular: Normal rate, regular rhythm. Grossly normal heart sounds.  Good peripheral circulation. Respiratory: Normal respiratory effort.  No retractions. Lungs CTAB. Gastrointestinal: Soft and nontender. No distention. No abdominal bruits. No CVA tenderness. Genitourinary:  Musculoskeletal: No lower extremity tenderness, trace BLE edema.  No joint effusions. Neurologic:  Normal speech and language. No gross focal neurologic deficits are appreciated. No facial droop Skin:  Skin is warm, diffuse urticarial eruption over entire trunk, abdomen and back Psychiatric: Mood and affect are normal. Speech and behavior are  normal.  ____________________________________________   LABS (all labs ordered are listed, but only abnormal results are displayed)  No results found for this or any previous visit (from the past 24 hour(s)). ____________________________________________ ____________________________________________  [id number]  I personally reviewed all radiographic images ordered to evaluate for the above acute complaints and reviewed radiology reports and findings.  These findings were personally discussed with the patient.  Please see medical record for radiology report.  ____________________________________________   PROCEDURES  Procedure(s) performed:  Procedures    Critical Care performed: no ____________________________________________   INITIAL IMPRESSION / ASSESSMENT AND PLAN / ED COURSE  Pertinent labs & imaging results that were available during my care of the patient were reviewed by me and considered in my medical decision making (see chart for details).   DDX: anaphylaxis, angioedema, DRESS, sjs  [name] is a 85 y.o. who presents to the ED with rash and symptoms as described above concerning for anaphylactic reaction present in the setting of recent antibiotics however somewhat subacute but does certainly seem to be rapidly worsening therefore epinephrine was given as well as steroids H1  and H2 blockers.  Will be observed in the ER.  Will check basic blood work.  Does not seem consistent with TEN or SJS.  Likely drug eruption.  The patient will be placed on continuous pulse oximetry and telemetry for monitoring.  Laboratory evaluation will be sent to evaluate for the above complaints.     Clinical Course as of Sep 29 1519  Sat Sep 29, 2019  1448 Rash does appear to be improving.  Still having significant itching.   [PR]    Clinical Course User Index [PR] Merlyn Lot, MD   Will be observed in the ER.  Will signout to oncoming physician pending follow up labs and  reassess. Have discussed with the patient and available family all diagnostics and treatments performed thus far and all questions were answered to the best of my ability. The patient demonstrates understanding and agreement with plan.   The patient was evaluated in Emergency Department today for the symptoms described in the history of present illness. He/she was evaluated in the context of the global COVID-19 pandemic, which necessitated consideration that the patient might be at risk for infection with the SARS-CoV-2 virus that causes COVID-19. Institutional protocols and algorithms that pertain to the evaluation of patients at risk for COVID-19 are in a state of rapid change based on information released by regulatory bodies including the CDC and federal and state organizations. These policies and algorithms were followed during the patient's care in the ED.  As part of my medical decision making, I reviewed the following data within the [place] notes reviewed and incorporated, Labs reviewed, notes from prior ED visits and [place] Controlled Substance Database   ____________________________________________   FINAL CLINICAL IMPRESSION(S) / ED DIAGNOSES  Final diagnoses:  Anaphylaxis, initial encounter      NEW MEDICATIONS STARTED DURING THIS VISIT:  New Prescriptions   No medications on file     Note:  This document was prepared using Dragon voice recognition software and may include unintentional dictation errors.    Merlyn Lot, MD 09/29/19 [phone]

## 2019-09-29 NOTE — ED Notes (Signed)
PT's daughter states she feels like rash is lightening up and pt states she is ready to go. MD notified.

## 2019-09-29 NOTE — ED Provider Notes (Signed)
Procedures  Clinical Course as of Sep 28 1857  Sat Sep 29, 2019  1448 Rash does appear to be improving.  Still having significant itching.   [PR]    Clinical Course User Index [PR] Merlyn Lot, MD    ----------------------------------------- 6:58 PM on 09/29/2019 ----------------------------------------- Patient feels much better.  No more itching.  Rash improved.  Vital signs normal.  No tongue swelling or throat discomfort.  On exam, urticaria resolved.  Lungs clear to auscultation without wheezing or stridor.  No tongue swelling or elevation, no uvular edema, oropharynx looks normal.  Neck is soft and nontender.  Labs show sodium of 128, which is only slightly changed from her baseline sodium level of about 132.  Not clinically significant.  Counseled on continuing Benadryl and prednisone at home, EpiPen prescription.    Carrie Mew, MD 09/29/19 1900

## 2019-09-29 NOTE — ED Triage Notes (Addendum)
Pt here for allergic reaction after finishing cipro for UTI.  Pt has significant hives to entire body with itching.  Pt feels like has a knot in throat and having difficulty getting food to pass. Feels like hard to get a breath at times. Pt clearing throat in triage.  BP normally in 120s from previous record.

## 2019-10-02 NOTE — Chronic Care Management (AMB) (Signed)
  Chronic Care Management   Outreach Note  10/02/2019 Name: [name] MRN: [id number] DOB: 08-26-1925  Referred by: Olin Hauser, DO Reason for referral : Chronic Care Management (CCM R/S outreach was unsuccessful. ) and Chronic Care Management (CCM R/S outreach )   A second telephone outreach was attempted today to reschedule appointment. Patients daughter [name] states she will call me back to reschedule.  The patient was referred to the case management team for assistance with care management and care coordination.   Follow Up Plan: The care management team will reach out to the patient again over the next 7 days.   [place], [place] [address] Direct Dial: [email or web address]  Website: [email or web address]

## 2019-10-03 ENCOUNTER — Other Ambulatory Visit: Payer: Self-pay | Admitting: Family Medicine

## 2019-10-03 DIAGNOSIS — R339 Retention of urine, unspecified: Secondary | ICD-10-CM

## 2019-10-03 MED ORDER — TAMSULOSIN HCL 0.4 MG PO CAPS: 0.4000 mg | ORAL_CAPSULE | Freq: Every day | ORAL | 0 refills | Status: DC

## 2019-10-10 ENCOUNTER — Ambulatory Visit (INDEPENDENT_AMBULATORY_CARE_PROVIDER_SITE_OTHER): Payer: Medicare Other | Admitting: *Deleted

## 2019-10-10 DIAGNOSIS — M1990 Unspecified osteoarthritis, unspecified site: Secondary | ICD-10-CM | POA: Diagnosis not present

## 2019-10-10 DIAGNOSIS — F411 Generalized anxiety disorder: Secondary | ICD-10-CM

## 2019-10-10 DIAGNOSIS — Z87898 Personal history of other specified conditions: Secondary | ICD-10-CM

## 2019-10-10 DIAGNOSIS — R413 Other amnesia: Secondary | ICD-10-CM

## 2019-10-10 NOTE — Chronic Care Management (AMB) (Signed)
Chronic Care Management   Follow Up Note   10/10/2019 Name: [name] MRN: [id number] DOB: [birth year]  Referred by: Olin Hauser, DO Reason for referral : Chronic Care Management (Generalized Anxiety Disorder) and Care Coordination (Respite care/palliative )   [name] is a 85 y.o. year old female who is a primary care patient of Olin Hauser, DO. The CCM team was consulted for assistance with chronic disease management and care coordination needs.    Review of patient status, including review of consultants reports, relevant laboratory and other test results, and collaboration with appropriate care team members and the patient's provider was performed as part of comprehensive patient evaluation and provision of chronic care management services.    SDOH (Social Determinants of Health) screening performed today: Stress. See Care Plan for related entries.   Outpatient Encounter Medications as of 10/10/2019  Medication Sig Note  . amLODipine (NORVASC) 5 MG tablet Take 1 tablet (5 mg total) by mouth daily.   . carvedilol (COREG) 6.25 MG tablet Take 1 tablet (6.25 mg total) by mouth 2 (two) times daily with a meal.   . diphenhydrAMINE (BENADRYL) 25 MG tablet Take 1 tablet (25 mg total) by mouth every 6 (six) hours as needed for itching or allergies.   [name] FLUoxetine (PROZAC) 20 MG capsule Take 1 capsule (20 mg total) by mouth daily.   . furosemide (LASIX) 20 MG tablet Take 1 tablet po QD prn   . HYDROcodone-acetaminophen (NORCO) 10-325 MG tablet Take 1 tablet by mouth every 4 (four) hours as needed for moderate pain.  07/29/2018: Takes fairly regularly   . LORazepam (ATIVAN) 0.5 MG tablet Take 1 tablet (0.5 mg total) by mouth 3 (three) times daily as needed for anxiety.   . methylPREDNISolone (MEDROL) 4 MG TBPK tablet Follow package instruction for steroid taper.   . mirtazapine (REMERON) 30 MG tablet Take 1 tablet (30 mg total) by mouth at bedtime.   .  ondansetron (ZOFRAN) 4 MG tablet Take 1 tablet (4 mg total) by mouth every 8 (eight) hours as needed for nausea.   . phenazopyridine (PYRIDIUM) 100 MG tablet Take 1 tablet (100 mg total) by mouth 3 (three) times daily as needed (bladder pain).   . tamsulosin (FLOMAX) 0.4 MG CAPS capsule Take 1 capsule (0.4 mg total) by mouth daily.    No facility-administered encounter medications on file as of 10/10/2019.     Goals Addressed            This Visit's Progress   . we need help with mom's care (pt-stated)       Current Barriers:  [name] Knowledge Deficits related to community resources to assist with patient's care . Lacks caregiver support.  . Film/video editor.  . Cognitive Deficits . No Advanced Directives in place  Nurse Case Manager Clinical Goal(s):  [name] Over the next 90 days, patient will work with [facility] to address needs related to Caregiver strain.   Interventions: ALL INTERVENTIONS DISCUSSED WITH DAUGHTER-Claudia . Evaluation of current treatment plan related to HTN, GAD, Chronic pain, UTIs and patient's adherence to plan as established by provider. Nash Dimmer with PCP  regarding palliative care consult. ([name]'s discussing this and will let me know)  . Discussed plans with patient for ongoing care management follow up and provided patient with direct contact information for care management team . Provided patient and/or caregiver with verbal  information about palliative care, and Museum/gallery curator (community resource). . Social Work referral for  daughter's in need of caregiver resources and possible CHORE referral. . Email sent to Burr Medico at [facility] to request she outreach daughter Rosemarie Ax to discuss respite resources.  . Talked with daughter Rosemarie Ax at length concerning the patient's care. She expresses the great exhaustion both daughters are experiencing caring for their mother and trying to continue working. Rosemarie Ax expresses her mother has many  complaints and it is hard to determine if the patient is truly "sick" because the complaints are very general and she will not agree to go to the doctor most times. Rosemarie Ax expressed their mother is extremely demanding on her sister [name] who lives in the home. Rosemarie Ax stated at times her mom can walk well with her walker and others she need total assistance. She expressed the patient has recently not wanting to bathe or change clothes and she wonders if this could be the beginning of some sort of memory issue, because her mom has always been very "Prissy" about her looks.  . Expressed patient is very [place] . Discussed the benefit of Palliative care to have NP come to the patient's home to evaluate patient and assist with symptom management. Rosemarie Ax is going to discuss this with her sister Daine Floras.   Patient Self Care Activities:  . Calls provider office for new concerns or questions inappropriately at times  . Unable to independently care for self . Does not attend all scheduled provider appointments . Unable to perform IADLs independently . Vision impairment . Hearing impairment  Please see past updates related to this goal by clicking on the "Past Updates" button in the selected goal            The care management team will reach out to the patient again over the next 60 days.    Merlene Morse Jacquese Cassarino RN, BSN Nurse Case Pharmacist, [facility] Care Management  [phone]) [employer]

## 2019-10-10 NOTE — Chronic Care Management (AMB) (Signed)
  Care Management   Note  10/10/2019 Name: [name] MRN: [id number] DOB: 1924/10/30  [name] is a 85 y.o. year old female who is a primary care patient of Olin Hauser, DO and is actively engaged with the care management team. I reached out to [name] by phone today to assist with re-schedulinga follow up appointment with the RN Case Manager  Follow up plan: The CM team will reach out to the patient again over the next 7 days.   [place], [place] [address] Direct Dial: [email or web address]  Website: [email or web address]

## 2019-10-10 NOTE — Patient Instructions (Signed)
Thank you allowing the Chronic Care Management Team to be a part of your care! It was a pleasure speaking with you today!   CCM (Chronic Care Management) Team   Saketh Daubert RN, BSN Nurse Care Coordinator  [phone]  Harlow Asa PharmD  Clinical Pharmacist  [phone]  Eula Fried LCSW Clinical Social Worker [phone]  Goals Addressed            This Visit's Progress   . we need help with mom's care (pt-stated)       Current Barriers:  [name] Knowledge Deficits related to community resources to assist with patient's care . Lacks caregiver support.  . Film/video editor.  . Cognitive Deficits . No Advanced Directives in place  Nurse Case Manager Clinical Goal(s):  [name] Over the next 90 days, patient will work with [facility] to address needs related to Caregiver strain.   Interventions: ALL INTERVENTIONS DISCUSSED WITH DAUGHTER-Claudia . Evaluation of current treatment plan related to HTN, GAD, Chronic pain, UTIs and patient's adherence to plan as established by provider. Nash Dimmer with PCP  regarding palliative care consult. ([name]'s discussing this and will let me know)  . Discussed plans with patient for ongoing care management follow up and provided patient with direct contact information for care management team . Provided patient and/or caregiver with verbal  information about palliative care, and Museum/gallery curator (community resource). . Social Work referral for daughter's in need of caregiver resources and possible CHORE referral. . Email sent to Burr Medico at [employer] to request she outreach daughter Rosemarie Ax to discuss respite resources.  . Talked with daughter Rosemarie Ax at length concerning the patient's care. She expresses the great exhaustion both daughters are experiencing caring for their mother and trying to continue working. Rosemarie Ax expresses her mother has many complaints and it is hard to determine if the patient is truly  "sick" because the complaints are very general and she will not agree to go to the doctor most times. Rosemarie Ax expressed their mother is extremely demanding on her sister [name] who lives in the home. Rosemarie Ax stated at times her mom can walk well with her walker and others she need total assistance. She expressed the patient has recently not wanting to bathe or change clothes and she wonders if this could be the beginning of some sort of memory issue, because her mom has always been very "Prissy" about her looks.  . Expressed patient is very [place] . Discussed the benefit of Palliative care to have NP come to the patient's home to evaluate patient and assist with symptom management. Rosemarie Ax is going to discuss this with her sister Daine Floras.   Patient Self Care Activities:  . Calls provider office for new concerns or questions inappropriately at times  . Unable to independently care for self . Does not attend all scheduled provider appointments . Unable to perform IADLs independently . Vision impairment . Hearing impairment  Please see past updates related to this goal by clicking on the "Past Updates" button in the selected goal           The patient verbalized understanding of instructions provided today and declined a print copy of patient instruction materials.   The patient has been provided with contact information for the care management team and has been advised to call with any health related questions or concerns.

## 2019-10-11 ENCOUNTER — Ambulatory Visit: Payer: Self-pay | Admitting: Licensed Clinical Social Worker

## 2019-10-11 ENCOUNTER — Telehealth: Payer: Self-pay

## 2019-10-11 NOTE — Chronic Care Management (AMB) (Signed)
  Care Management   Follow Up Note   10/11/2019 Name: JAZMERE STAILEY MRN: [id number] DOB: 08-17-1925  Referred by: Olin Hauser, DO Reason for referral : Care Coordination   NIKYRA BAIRD is a 86 y.o. year old female who is a primary care patient of Olin Hauser, DO. The care management team was consulted for assistance with care management and care coordination needs.    Review of patient status, including review of consultants reports, relevant laboratory and other test results, and collaboration with appropriate care team members and the patient's provider was performed as part of comprehensive patient evaluation and provision of chronic care management services.    LCSW completed CCM outreach attempt today but was unable to reach patient successfully. A HIPPA compliant voice message was left encouraging patient to return call once available. LCSW rescheduled CCM SW appointment as well.  A HIPPA compliant phone message was left for the patient providing contact information and requesting a return call.   Eula Fried, BSW, MSW, [facility] Care Management [email or web address] Phone: [phone]

## 2019-10-15 ENCOUNTER — Ambulatory Visit (INDEPENDENT_AMBULATORY_CARE_PROVIDER_SITE_OTHER): Payer: Medicare Other | Admitting: *Deleted

## 2019-10-15 DIAGNOSIS — Z87898 Personal history of other specified conditions: Secondary | ICD-10-CM

## 2019-10-15 DIAGNOSIS — F411 Generalized anxiety disorder: Secondary | ICD-10-CM

## 2019-10-15 DIAGNOSIS — M1990 Unspecified osteoarthritis, unspecified site: Secondary | ICD-10-CM

## 2019-10-15 DIAGNOSIS — I10 Essential (primary) hypertension: Secondary | ICD-10-CM

## 2019-10-15 DIAGNOSIS — R413 Other amnesia: Secondary | ICD-10-CM

## 2019-10-15 NOTE — Patient Instructions (Signed)
Thank you allowing the Chronic Care Management Team to be a part of your care! It was a pleasure speaking with you today!   CCM (Chronic Care Management) Team   Verdie Wilms RN, BSN Nurse Care Coordinator  [phone]  Harlow Asa PharmD  Clinical Pharmacist  [phone]  Eula Fried LCSW Clinical Social Worker [phone]  Goals Addressed            This Visit's Progress   . we need help with mom's care (pt-stated)       Current Barriers:  [name] Knowledge Deficits related to community resources to assist with patient's care . Lacks caregiver support.  . Film/video editor.  . Cognitive Deficits . No Advanced Directives in place  Nurse Case Manager Clinical Goal(s):  [name] Over the next 90 days, patient will work with [facility] to address needs related to Caregiver strain.   Interventions: ALL INTERVENTIONS DISCUSSED WITH DAUGHTER-Claudia . Evaluation of current treatment plan related to HTN, GAD, Chronic pain, UTIs and patient's adherence to plan as established by provider. . Discussed plans with patient for ongoing care management follow up and provided patient with direct contact information for care management team . Social Work referral for follow up with patient about her anxiety. Note she is very [place].  . Talked with daughter [name] who lives in the home with patient at length concerning the patient's care. She expresses the great exhaustion both daughters are experiencing caring for their mother and trying to continue working. [name] expresses her mother has many complaints and it is hard to determine if the patient is truly "sick" because the complaints are very general and she will not agree to go to the doctor most times. [name] expressed their mother is extremely demanding of her and although she is in the home her mother wants her to be right there beside her at all times. [name] stated the patient has had elevated anxiety since her fall a few months ago, and will say  " I am dying" a lot.  [name] stated at times her mom can walk well with her walker and others she need total assistance x-rays performed at recent ED visit showed no evidence of fracture to feet/legs.  She expressed the patient has recently(Just this month) has been seeing flowers on the walls. She denies patient having any urinary tract infection symptoms. Made her aware delusions can be a sign of a UTI in an elderly person.  . Expressed patient is very [place] . Discussed the benefit of Palliative care to have NP come to the patient's home to evaluate patient and assist with symptom management. [name] is going to discuss this with her sister and get back to me. .   Patient Self Care Activities:  . Calls provider office for new concerns or questions inappropriately at times  . Unable to independently care for self . Does not attend all scheduled provider appointments . Unable to perform IADLs independently . Vision impairment . Hearing impairment  Please see past updates related to this goal by clicking on the "Past Updates" button in the selected goal           The patient verbalized understanding of instructions provided today and declined a print copy of patient instruction materials.   The patient has been provided with contact information for the care management team and has been advised to call with any health related questions or concerns.

## 2019-10-15 NOTE — Chronic Care Management (AMB) (Signed)
Chronic Care Management   Follow Up Note   10/15/2019 Name: [name] MRN: [id number] DOB: 07/31/1925  Referred by: Charlene Hauser, DO Reason for referral : Chronic Care Management (Generalized anxiety order)   [name] is a 86 y.o. year old female who is a primary care patient of Charlene Hauser, DO. The CCM team was consulted for assistance with chronic disease management and care coordination needs.    Review of patient status, including review of consultants reports, relevant laboratory and other test results, and collaboration with appropriate care team members and the patient's provider was performed as part of comprehensive patient evaluation and provision of chronic care management services.    SDOH (Social Determinants of Health) screening performed today: Stress Physical Activity. See Care Plan for related entries.   Outpatient Encounter Medications as of 10/15/2019  Medication Sig Note  . amLODipine (NORVASC) 5 MG tablet Take 1 tablet (5 mg total) by mouth daily.   . carvedilol (COREG) 6.25 MG tablet Take 1 tablet (6.25 mg total) by mouth 2 (two) times daily with a meal.   . diphenhydrAMINE (BENADRYL) 25 MG tablet Take 1 tablet (25 mg total) by mouth every 6 (six) hours as needed for itching or allergies.   [name] FLUoxetine (PROZAC) 20 MG capsule Take 1 capsule (20 mg total) by mouth daily.   . furosemide (LASIX) 20 MG tablet Take 1 tablet po QD prn   . HYDROcodone-acetaminophen (NORCO) 10-325 MG tablet Take 1 tablet by mouth every 4 (four) hours as needed for moderate pain.  07/29/2018: Takes fairly regularly   . LORazepam (ATIVAN) 0.5 MG tablet Take 1 tablet (0.5 mg total) by mouth 3 (three) times daily as needed for anxiety.   . methylPREDNISolone (MEDROL) 4 MG TBPK tablet Follow package instruction for steroid taper.   . mirtazapine (REMERON) 30 MG tablet Take 1 tablet (30 mg total) by mouth at bedtime.   . ondansetron (ZOFRAN) 4 MG tablet Take 1  tablet (4 mg total) by mouth every 8 (eight) hours as needed for nausea.   . phenazopyridine (PYRIDIUM) 100 MG tablet Take 1 tablet (100 mg total) by mouth 3 (three) times daily as needed (bladder pain).   . tamsulosin (FLOMAX) 0.4 MG CAPS capsule Take 1 capsule (0.4 mg total) by mouth daily.    No facility-administered encounter medications on file as of 10/15/2019.     Goals Addressed            This Visit's Progress   . we need help with mom's care (pt-stated)       Current Barriers:  [name] Knowledge Deficits related to community resources to assist with patient's care . Lacks caregiver support.  . Film/video editor.  . Cognitive Deficits . No Advanced Directives in place  Nurse Case Manager Clinical Goal(s):  [name] Over the next 90 days, patient will work with [facility] to address needs related to Caregiver strain.   Interventions: ALL INTERVENTIONS DISCUSSED WITH DAUGHTER-Claudia . Evaluation of current treatment plan related to HTN, GAD, Chronic pain, UTIs and patient's adherence to plan as established by provider. . Discussed plans with patient for ongoing care management follow up and provided patient with direct contact information for care management team . Social Work referral for follow up with patient about her anxiety. Note she is very HOH.  . Talked with daughter [name] who lives in the home with patient at length concerning the patient's care. She expresses the great exhaustion both daughters are experiencing  caring for their mother and trying to continue working. [name] expresses her mother has many complaints and it is hard to determine if the patient is truly "sick" because the complaints are very general and she will not agree to go to the doctor most times. [name] expressed their mother is extremely demanding of her and although she is in the home her mother wants her to be right there beside her at all times. [name] stated the patient has had elevated anxiety since her fall a few  months ago, and will say " I am dying" a lot.  [name] stated at times her mom can walk well with her walker and others she need total assistance x-rays performed at recent ED visit showed no evidence of fracture to feet/legs.  She expressed the patient has recently(Just this month) has been seeing flowers on the walls. She denies patient having any urinary tract infection symptoms. Made her aware delusions can be a sign of a UTI in an elderly person.  . Expressed patient is very [place] . Discussed the benefit of Palliative care to have NP come to the patient's home to evaluate patient and assist with symptom management. [name] is going to discuss this with her sister and get back to me. .   Patient Self Care Activities:  . Calls provider office for new concerns or questions inappropriately at times  . Unable to independently care for self . Does not attend all scheduled provider appointments . Unable to perform IADLs independently . Vision impairment . Hearing impairment  Please see past updates related to this goal by clicking on the "Past Updates" button in the selected goal            The care management team will reach out to the patient again over the next 60 days.  The patient has been provided with contact information for the care management team and has been advised to call with any health related questions or concerns.    Merlene Morse Winnell Bento RN, BSN Nurse Case Pharmacist, [facility]  [phone]) [employer]

## 2019-10-16 ENCOUNTER — Ambulatory Visit: Payer: Self-pay | Admitting: *Deleted

## 2019-10-16 ENCOUNTER — Other Ambulatory Visit: Payer: Self-pay

## 2019-10-16 DIAGNOSIS — G501 Atypical facial pain: Secondary | ICD-10-CM | POA: Diagnosis not present

## 2019-10-16 DIAGNOSIS — F411 Generalized anxiety disorder: Secondary | ICD-10-CM

## 2019-10-16 DIAGNOSIS — R040 Epistaxis: Secondary | ICD-10-CM | POA: Diagnosis not present

## 2019-10-16 DIAGNOSIS — H9202 Otalgia, left ear: Secondary | ICD-10-CM | POA: Diagnosis not present

## 2019-10-16 DIAGNOSIS — I10 Essential (primary) hypertension: Secondary | ICD-10-CM

## 2019-10-16 MED ORDER — LORAZEPAM 0.5 MG PO TABS: 0.5000 mg | ORAL_TABLET | Freq: Three times a day (TID) | ORAL | 2 refills | Status: DC | PRN

## 2019-10-16 NOTE — Chronic Care Management (AMB) (Signed)
Chronic Care Management   Follow Up Note   10/16/2019 Name: [name] MRN: [id number] DOB: 12-27-1924  Referred by: Olin Hauser, DO Reason for referral : No chief complaint on file.   [name] is a 86 y.o. year old female who is a primary care patient of Olin Hauser, DO. The CCM team was consulted for assistance with chronic disease management and care coordination needs.    Review of patient status, including review of consultants reports, relevant laboratory and other test results, and collaboration with appropriate care team members and the patient's provider was performed as part of comprehensive patient evaluation and provision of chronic care management services.    SDOH (Social Determinants of Health) screening performed today: None. See Care Plan for related entries.   Outpatient Encounter Medications as of 10/16/2019  Medication Sig Note  . amLODipine (NORVASC) 5 MG tablet Take 1 tablet (5 mg total) by mouth daily.   . carvedilol (COREG) 6.25 MG tablet Take 1 tablet (6.25 mg total) by mouth 2 (two) times daily with a meal.   . diphenhydrAMINE (BENADRYL) 25 MG tablet Take 1 tablet (25 mg total) by mouth every 6 (six) hours as needed for itching or allergies.   [name] FLUoxetine (PROZAC) 20 MG capsule Take 1 capsule (20 mg total) by mouth daily.   . furosemide (LASIX) 20 MG tablet Take 1 tablet po QD prn   . HYDROcodone-acetaminophen (NORCO) 10-325 MG tablet Take 1 tablet by mouth every 4 (four) hours as needed for moderate pain.  07/29/2018: Takes fairly regularly   . LORazepam (ATIVAN) 0.5 MG tablet Take 1 tablet (0.5 mg total) by mouth 3 (three) times daily as needed for anxiety.   . methylPREDNISolone (MEDROL) 4 MG TBPK tablet Follow package instruction for steroid taper.   . mirtazapine (REMERON) 30 MG tablet Take 1 tablet (30 mg total) by mouth at bedtime.   . ondansetron (ZOFRAN) 4 MG tablet Take 1 tablet (4 mg total) by mouth every 8  (eight) hours as needed for nausea.   . phenazopyridine (PYRIDIUM) 100 MG tablet Take 1 tablet (100 mg total) by mouth 3 (three) times daily as needed (bladder pain).   . tamsulosin (FLOMAX) 0.4 MG CAPS capsule Take 1 capsule (0.4 mg total) by mouth daily.    No facility-administered encounter medications on file as of 10/16/2019.     Goals Addressed            This Visit's Progress   . we need help with mom's care (pt-stated)       Current Barriers:  [name] Knowledge Deficits related to community resources to assist with patient's care . Lacks caregiver support.  . Film/video editor.  . Cognitive Deficits . No Advanced Directives in place  Nurse Case Manager Clinical Goal(s):  [name] Over the next 90 days, patient will work with [facility] to address needs related to Caregiver strain.   Interventions: ALL INTERVENTIONS DISCUSSED WITH DAUGHTER-Claudia . Evaluation of current treatment plan related to HTN, GAD, Chronic pain, UTIs and patient's adherence to plan as established by provider. . Discussed plans with patient for ongoing care management follow up and provided patient with direct contact information for care management team . Social Work referral for follow up with patient about her anxiety. Note she is very [place].  . Talked with daughter [name] who lives in the home with patient at length concerning the patient's care. She expresses the great exhaustion both daughters are experiencing caring for their  mother and trying to continue working. [name] expresses her mother has many complaints and it is hard to determine if the patient is truly "sick" because the complaints are very general and she will not agree to go to the doctor most times. [name] expressed their mother is extremely demanding of her and although she is in the home her mother wants her to be right there beside her at all times. [name] stated the patient has had elevated anxiety since her fall a few months ago, and will say " I am dying"  a lot.  [name] stated at times her mom can walk well with her walker and others she need total assistance x-rays performed at recent ED visit showed no evidence of fracture to feet/legs.  She expressed the patient has recently(Just this month) has been seeing flowers on the walls. She denies patient having any urinary tract infection symptoms. Made her aware delusions can be a sign of a UTI in an elderly person.  . Expressed patient is very [place] . Discussed the benefit of Palliative care to have NP come to the patient's home to evaluate patient and assist with symptom management. [name] is going to discuss this with her sister and get back to me. . Placed a call to patient's daughter Rosemarie Ax and left a detailed message letting her know about if patient is continuing to take benadryl it could cause hallucinations. Requested a call back from daughter to confirm message was received.    Patient Self Care Activities:  . Calls provider office for new concerns or questions inappropriately at times  . Unable to independently care for self . Does not attend all scheduled provider appointments . Unable to perform IADLs independently . Vision impairment . Hearing impairment  Please see past updates related to this goal by clicking on the "Past Updates" button in the selected goal            A HIPPA compliant phone message was left for the patient providing contact information and requesting a return call.    Merlene Morse Forrestine Lecrone RN, BSN Nurse Case Pharmacist, [facility]  [phone]) [employer]

## 2019-10-17 ENCOUNTER — Telehealth: Payer: Self-pay

## 2019-10-17 NOTE — Telephone Encounter (Signed)
The pt daughter called with concerns that her mother might have another UTI. She state that she is complaining of bladder pain, cloudy urine and her urine has a foul odor x 2 day. She want to know if she can come by and pick up a specimens cup and schedule a virtual visit. I informed her that we currently do not have any appointment available today or tomorrow. I recommended that she contact [facility] to see if she can get a virtual visit with them today or tomorrow. I also informed her that I will send Dr. Raliegh Ip a message and f/u with his recommendation.

## 2019-10-17 NOTE — Telephone Encounter (Signed)
Recent course.  09/24/19 evaluated for possible UTI. Urine  Was negative for UTI.  She did receive antibiotics at that time however, and developed allergic reaction to Amoxicillin, she was back in ED on 09/29/19 with Anaphylaxis allergic reaction and treated.  In the ED on 09/24/19 they did a bladder scan showed residual post void 300 cc they offered foley but she declined.  She has had issues with some hallucinations at times as well. There was concern of UTI due to confusion and hallucinations.  --------------------------------  I am concerned that I am not best suited to treat this issue currently especially if she is retaining urine.  She would probably benefit from a urine specimen and bladder scan from urology to determine appropriate course of treatment, because I am hesitant to prescribe an antibiotic if she doesn't really need one and that may put her at risk of allergic reaction again.  [name], [facility] 10/17/2019, 12:19 PM

## 2019-10-17 NOTE — Telephone Encounter (Signed)
The pt daughter was notified of Dr. Raliegh Ip recommendation and she verbalize understanding, no questions or concerns.

## 2019-10-18 ENCOUNTER — Other Ambulatory Visit: Payer: Self-pay

## 2019-10-18 ENCOUNTER — Telehealth: Payer: Self-pay | Admitting: Physician Assistant

## 2019-10-18 ENCOUNTER — Ambulatory Visit: Payer: Medicare Other | Admitting: Physician Assistant

## 2019-10-18 ENCOUNTER — Other Ambulatory Visit: Payer: Medicare Other

## 2019-10-18 ENCOUNTER — Other Ambulatory Visit: Payer: Self-pay | Admitting: Physician Assistant

## 2019-10-18 ENCOUNTER — Encounter: Payer: Self-pay | Admitting: Physician Assistant

## 2019-10-18 DIAGNOSIS — R339 Retention of urine, unspecified: Secondary | ICD-10-CM | POA: Diagnosis not present

## 2019-10-18 DIAGNOSIS — R300 Dysuria: Secondary | ICD-10-CM

## 2019-10-18 NOTE — Telephone Encounter (Signed)
Patient dropped off a urine sample today with concern for UTI with symptoms of dysuria and malodorous urine.  UA grossly infected, will send for culture.  I just spoke with the patient's daughter, [name], via telephone to report the results of her urinalysis.  I explained that given her numerous drug allergies and recent anaphylactoid reaction to amoxicillin, I will not be prescribing antibiotics until urine culture results.  I expect these results on Monday and will start her on appropriate treatment at that time.  I advised her to treat her mother's discomfort with Azo in the meantime.  I explained that she should never take this medication for longer than 2 to 3 days continuously due to concerns for kidney damage.  [name] inquired about starting her mother on cranberry juice.  I explained that this may be a good option for UTI prevention, however will not treat an active infection.  I counseled her that I prefer cranberry supplements, as they more concentrated and cranberry juice and contain less sugar.  She expressed understanding.

## 2019-10-19 ENCOUNTER — Ambulatory Visit: Payer: Self-pay | Admitting: Physician Assistant

## 2019-10-19 ENCOUNTER — Telehealth: Payer: Self-pay | Admitting: Physician Assistant

## 2019-10-19 ENCOUNTER — Other Ambulatory Visit: Payer: Self-pay | Admitting: Physician Assistant

## 2019-10-19 LAB — URINALYSIS, COMPLETE
Bilirubin, UA: NEGATIVE
Glucose, UA: NEGATIVE
Ketones, UA: NEGATIVE
Nitrite, UA: POSITIVE — AB
Protein,UA: NEGATIVE
RBC, UA: NEGATIVE
Specific Gravity, UA: 1.02 (ref 1.005–1.030)
Urobilinogen, Ur: 1 mg/dL (ref 0.2–1.0)
pH, UA: 7 (ref 5.0–7.5)

## 2019-10-19 LAB — MICROSCOPIC EXAMINATION
Epithelial Cells (non renal): NONE SEEN /hpf (ref 0–10)
RBC, Urine: NONE SEEN /hpf (ref 0–2)

## 2019-10-19 MED ORDER — FOSFOMYCIN TROMETHAMINE 3 G PO PACK: 3.0000 g | PACK | Freq: Once | ORAL | 0 refills | Status: DC

## 2019-10-19 MED ORDER — CEPHALEXIN 500 MG PO CAPS: 500.0000 mg | ORAL_CAPSULE | Freq: Two times a day (BID) | ORAL | 0 refills | Status: AC

## 2019-10-19 NOTE — Telephone Encounter (Signed)
I just spoke with the patient's daughter, [name], via telephone.  She reports that [name] has been screaming all night in pain.  She reports she is urinating.  Given the patient's history of recurrent urinary retention, I explained that I would like Charlene to come into clinic for bladder scan.  [name] states that [name] is bedridden at the moment and that she is unable to bring Charlene to clinic.  She states this bedridden status does not represent an acute change requiring immediate intervention.  She requests a prescription for Keflex, as this has successfully treated [name]'s urinary tract infections in the past.  [name] most recent urine culture from December 2020 was resistant to first generation cephalosporins.  Additionally, she developed an anaphylactoid reaction to amoxicillin later that month.  I do not feel safe prescribing a first generation cephalosporin at this time given evidence of Charlene increasing resistance to these medications as well as potential cross-reactivity with amoxicillin.  I have prescribed 1 dose of fosfomycin for treatment of the patient's UTI.  I would like to see Charlene in clinic in 1 week for a bladder scan to rule out urinary retention.    We have had numerous conversation with the patient and Charlene family regarding the need for indwelling catheterization for management of [name]'s urinary retention, however they have repeatedly refused this. As I believe this known, unmanaged problem is playing a significant role in Charlene recurrent UTIs, I do not believe an ID consultation is warranted at this time.

## 2019-10-19 NOTE — Progress Notes (Signed)
I just spoke to the patient's daughter, [name], again via telephone.  She states that she spoke with her pharmacy and that she is not willing to administer fosfomycin for treatment of her mother's urinary tract infection due to concerns of the strength of this medication.  Despite ED notes to the contrary, she insists that her mother did not have an allergic reaction to amoxicillin 1 month ago.  She insists that the reaction that was documented was in response to ciprofloxacin and that she has tolerated amoxicillin well in the past.  She again requests Keflex for treatment of this infection.  I reiterated that her mother's most recent urine culture was resistant to first generation cephalosporins.  [name] expressed understanding of this and continued to request Keflex.  In light of the patient's grossly infected UA, apparent discomfort, and the family's unwillingness to utilize any other antibiotics in the interim, I am prescribing empiric Keflex.  I gave [name] strict instructions to monitor her mother very closely for potential allergic reactions.  She expressed understanding.

## 2019-10-21 LAB — CULTURE, URINE COMPREHENSIVE

## 2019-10-26 ENCOUNTER — Ambulatory Visit: Payer: Self-pay | Admitting: Licensed Clinical Social Worker

## 2019-10-26 ENCOUNTER — Telehealth: Payer: Self-pay | Admitting: Physician Assistant

## 2019-10-26 NOTE — Telephone Encounter (Signed)
Pt's daughter, [name] called office asking about pt's urine culture.  Pt was told Keflex was the appropriate abx for her infection.  Pt's daughter told me pt's lips were swelling some, but she finished abx and Benadryl was given to pt.  She said the infection seemed to have cleared up.

## 2019-10-26 NOTE — Chronic Care Management (AMB) (Signed)
  Care Management   Follow Up Note   10/26/2019 Name: [name] MRN: [id number] DOB: Jan 29, 1925  Referred by: Olin Hauser, DO Reason for referral : No chief complaint on file.  ERRONEOUS ENCOUNTER.   Eula Fried, BSW, MSW, [email or web address] Phone: [phone]

## 2019-10-30 ENCOUNTER — Encounter: Payer: Self-pay | Admitting: Physician Assistant

## 2019-10-30 ENCOUNTER — Other Ambulatory Visit: Payer: Self-pay

## 2019-10-30 ENCOUNTER — Other Ambulatory Visit: Payer: Self-pay | Admitting: Physician Assistant

## 2019-10-30 ENCOUNTER — Encounter: Payer: Self-pay | Admitting: Family Medicine

## 2019-10-30 ENCOUNTER — Ambulatory Visit (INDEPENDENT_AMBULATORY_CARE_PROVIDER_SITE_OTHER): Payer: Medicare Other | Admitting: Family Medicine

## 2019-10-30 VITALS — BP 101/79 | HR 95

## 2019-10-30 DIAGNOSIS — H6692 Otitis media, unspecified, left ear: Secondary | ICD-10-CM | POA: Diagnosis not present

## 2019-10-30 MED ORDER — AMOXICILLIN 500 MG PO CAPS: 500.0000 mg | ORAL_CAPSULE | Freq: Two times a day (BID) | ORAL | 0 refills | Status: DC

## 2019-10-30 MED ORDER — OFLOXACIN 0.3 % OT SOLN: 5.0000 [drp] | Freq: Two times a day (BID) | OTIC | 0 refills | Status: DC

## 2019-10-30 NOTE — Addendum Note (Signed)
Addended by: Olin Hauser on: 10/30/2019 03:43 PM   Modules accepted: Orders

## 2019-10-30 NOTE — Progress Notes (Addendum)
Virtual Visit via Telephone The purpose of this virtual visit is to provide medical care while limiting exposure to the novel coronavirus (COVID19) for both patient and office staff.  Consent was obtained for phone visit:  Yes.   Answered questions that patient had about telehealth interaction:  Yes.   I discussed the limitations, risks, security and privacy concerns of performing an evaluation and management service by telephone. I also discussed with the patient that there may be a patient responsible charge related to this service. The patient expressed understanding and agreed to proceed.  Patient Location: Home Provider Location: [facility])  ---------------------------------------------------------------------- Chief Complaint  Patient presents with  . Ear Pain    left ear pain w/ mild drainage, headache and warm feeling x 3 days    S: Reviewed CMA documentation. I have called patient and gathered additional HPI as follows:  Left Ear Pain, Infection Reports that symptoms started >3 days ago with Left ear infection having ear pain, drainage and warm feeling but no fever or other symptoms. She has some chronic loss of hearing and history of recurrent ear infections in past. She has usually taken Amoxicillin in past, she has received multiple other antibiotics in past but has multiple allergies listed on chart including cipro, levaquin, cephalosporin, PCN and Amoxicillin, however she has been taking Amoxicillin for ear infections for long time. Last episode reaction in 09/2019 had anaphylaxis, however there was discrepancy on if it was the cipro that caused the reaction.  Denies any high risk travel to areas of current concern for COVID19. Denies any known or suspected exposure to person with or possibly with COVID19.  Denies any fevers, chills, sweats, body ache, cough, shortness of breath, sinus pain or pressure, headache, abdominal pain, diarrhea  Past  Medical History:  Diagnosis Date  . Allergy    environmental  . Anxiety   . Chronic back pain   . High cholesterol   . History of urinary retention   . Hypertension   . Migraines   . Osteoporosis   . Pneumonia   . Sinusitis    Social History   Tobacco Use  . Smoking status: Never Smoker  . Smokeless tobacco: Never Used  Substance Use Topics  . Alcohol use: No  . Drug use: No    Current Outpatient Medications:  .  amLODipine (NORVASC) 5 MG tablet, Take 1 tablet (5 mg total) by mouth daily., Disp: 90 tablet, Rfl: 1 .  carvedilol (COREG) 6.25 MG tablet, Take 1 tablet (6.25 mg total) by mouth 2 (two) times daily with a meal., Disp: 180 tablet, Rfl: 0 .  FLUoxetine (PROZAC) 20 MG capsule, Take 1 capsule (20 mg total) by mouth daily., Disp: 30 capsule, Rfl: 5 .  furosemide (LASIX) 20 MG tablet, Take 1 tablet po QD prn, Disp: 90 tablet, Rfl: 1 .  HYDROcodone-acetaminophen (NORCO) 10-325 MG tablet, Take 1 tablet by mouth every 4 (four) hours as needed for moderate pain. , Disp: , Rfl:  .  LORazepam (ATIVAN) 0.5 MG tablet, Take 1 tablet (0.5 mg total) by mouth 3 (three) times daily as needed for anxiety., Disp: 90 tablet, Rfl: 2 .  mirtazapine (REMERON) 30 MG tablet, Take 1 tablet (30 mg total) by mouth at bedtime., Disp: 90 tablet, Rfl: 0 .  tamsulosin (FLOMAX) 0.4 MG CAPS capsule, Take 1 capsule (0.4 mg total) by mouth daily., Disp: 30 capsule, Rfl: 0 .  amoxicillin (AMOXIL) 500 MG capsule, Take 1 capsule (500 mg total)  by mouth 2 (two) times daily. For 10 days, Disp: 20 capsule, Rfl: 0 .  diphenhydrAMINE (BENADRYL) 25 MG tablet, Take 1 tablet (25 mg total) by mouth every 6 (six) hours as needed for itching or allergies. (Patient not taking: Reported on 10/30/2019), Disp: 30 tablet, Rfl: 0 .  EPINEPHrine 0.3 mg/0.3 mL IJ SOAJ injection, , Disp: , Rfl:  .  methylPREDNISolone (MEDROL) 4 MG TBPK tablet, Follow package instruction for steroid taper. (Patient not taking: Reported on  10/30/2019), Disp: 21 each, Rfl: 0 .  ofloxacin (FLOXIN) 0.3 % OTIC solution, Place 5 drops into the left ear 2 (two) times daily. For 7 days, Disp: 5 mL, Rfl: 0 .  ondansetron (ZOFRAN) 4 MG tablet, Take 1 tablet (4 mg total) by mouth every 8 (eight) hours as needed for nausea. (Patient not taking: Reported on 10/30/2019), Disp: 6 tablet, Rfl: 0 .  phenazopyridine (PYRIDIUM) 100 MG tablet, Take 1 tablet (100 mg total) by mouth 3 (three) times daily as needed (bladder pain). (Patient not taking: Reported on 10/30/2019), Disp: 10 tablet, Rfl: 0  Depression screen [facility] 2/9 04/06/2019 01/19/2019 07/07/2018  Decreased Interest 0 0 0  Down, Depressed, Hopeless 0 0 0  PHQ - 2 Score 0 0 0  Altered sleeping 0 0 -  Tired, decreased energy 0 1 -  Change in appetite 0 0 -  Feeling bad or failure about yourself  0 0 -  Trouble concentrating 0 0 -  Moving slowly or fidgety/restless 0 0 -  Suicidal thoughts 0 0 -  PHQ-9 Score 0 1 -  Difficult doing work/chores Not difficult at all Not difficult at all -    GAD 7 : Generalized Anxiety Score 06/22/2019 02/15/2019 01/19/2019  Nervous, Anxious, on Edge (No Data) (No Data) 0  Control/stop worrying - - 0  Worry too much - different things - - 0  Trouble relaxing - - 0  Restless - - 0  Easily annoyed or irritable - - 0  Afraid - awful might happen - - 0  Total GAD 7 Score - - 0  Anxiety Difficulty - - Not difficult at all    -------------------------------------------------------------------------- O: No physical exam performed due to remote telephone encounter.  Lab results reviewed.  Recent Results (from the past 2160 hour(s))  Comprehensive metabolic panel     Status: Abnormal   Collection Time: 08/14/19  5:28 PM  Result Value Ref Range   Sodium 133 (L) 135 - 145 mmol/L   Potassium 3.2 (L) 3.5 - 5.1 mmol/L   Chloride 105 98 - 111 mmol/L   CO2 20 (L) 22 - 32 mmol/L   Glucose, Bld 83 70 - 99 mg/dL   BUN 10 8 - 23 mg/dL   Creatinine, Ser 0.41 (L) 0.44  - 1.00 mg/dL   Calcium 8.0 (L) 8.9 - 10.3 mg/dL   Total Protein 5.8 (L) 6.5 - 8.1 g/dL   Albumin 3.2 (L) 3.5 - 5.0 g/dL   AST 17 15 - 41 U/L   ALT 10 0 - 44 U/L   Alkaline Phosphatase 55 38 - 126 U/L   Total Bilirubin 0.7 0.3 - 1.2 mg/dL   GFR calc non Af Amer >60 >60 mL/min   GFR calc Af Amer >60 >60 mL/min   Anion gap 8 5 - 15    Comment: Performed at [facility], [address] [place], [place] [address]  CBC     Status: Abnormal   Collection Time: 08/14/19  5:28 PM  Result Value Ref Range   WBC 7.5 4.0 - 10.5 K/uL   RBC 4.76 3.87 - 5.11 MIL/uL   Hemoglobin 12.2 12.0 - 15.0 g/dL   HCT 37.9 36.0 - 46.0 %   MCV 79.6 (L) 80.0 - 100.0 fL   MCH 25.6 (L) 26.0 - 34.0 pg   MCHC 32.2 30.0 - 36.0 g/dL   RDW 13.9 11.5 - 15.5 %   Platelets 213 150 - 400 K/uL   nRBC 0.0 0.0 - 0.2 %    Comment: Performed at [facility], [address] [place], [place] [address]  Urinalysis, Complete w Microscopic     Status: Abnormal   Collection Time: 08/14/19  5:28 PM  Result Value Ref Range   Color, Urine AMBER (A) YELLOW    Comment: BIOCHEMICALS MAY BE AFFECTED BY COLOR   APPearance CLEAR (A) CLEAR   Specific Gravity, Urine 1.003 (L) 1.005 - 1.030   pH 7.0 5.0 - 8.0   Glucose, UA NEGATIVE NEGATIVE mg/dL   Hgb urine dipstick NEGATIVE NEGATIVE   Bilirubin Urine NEGATIVE NEGATIVE   Ketones, ur NEGATIVE NEGATIVE mg/dL   Protein, ur NEGATIVE NEGATIVE mg/dL   Nitrite POSITIVE (A) NEGATIVE   Leukocytes,Ua NEGATIVE NEGATIVE   WBC, UA 0-5 0 - 5 WBC/hpf   Bacteria, UA RARE (A) NONE SEEN   Squamous Epithelial / LPF 0-5 0 - 5    Comment: Performed at [facility], [place], [place] [address]  Troponin I (High Sensitivity)     Status: None   Collection Time: 08/14/19  5:28 PM  Result Value Ref Range   Troponin I (High Sensitivity) 5 <18 ng/L    Comment: (NOTE) Elevated high sensitivity troponin I (hsTnI) values and significant  changes across serial  measurements may suggest ACS but many other  chronic and acute conditions are known to elevate hsTnI results.  Refer to the "Links" section for chest pain algorithms and additional  guidance. Performed at [facility], [address], [place], [place] [address]   Urine culture     Status: None   Collection Time: 08/14/19  5:28 PM   Specimen: Urine, Random  Result Value Ref Range   Specimen Description      URINE, RANDOM Performed at [facility], [address] [place], [place] [address]    Special Requests      NONE Performed at [facility], [address] [place], [place] [address]    Culture      NO GROWTH Performed at [facility], [place] [address], [place], [place] [address]    Report Status 08/15/2019 FINAL   Bladder Scan (Post Void Residual) in office     Status: None   Collection Time: 09/10/19  2:38 PM  Result Value Ref Range   Scan Result 11 mL  CULTURE, URINE COMPREHENSIVE     Status: Abnormal   Collection Time: 09/12/19  9:45 AM   Specimen: Urine   UR  Result Value Ref Range   Urine Culture, Comprehensive Final report (A)    Organism ID, Bacteria Klebsiella oxytoca (A)     Comment: Greater than 100,000 colony forming units per mL   ANTIMICROBIAL SUSCEPTIBILITY Comment     Comment:       ** S = Susceptible; I = Intermediate; R = Resistant **                    P = Positive; N =  Negative             MICS are expressed in micrograms per mL    Antibiotic                 RSLT#1    RSLT#2    RSLT#3    RSLT#4 Amoxicillin/Clavulanic Acid    I Ampicillin                     R Cefazolin                      R Cefepime                       S Ceftriaxone                    S Cefuroxime                     R Ciprofloxacin                  S Gentamicin                     S Imipenem                       S Levofloxacin                   S Meropenem                      S Nitrofurantoin                 I Tetracycline                    S Tobramycin                     S Trimethoprim/Sulfa             S   Urinalysis, Complete     Status: Abnormal   Collection Time: 09/12/19  9:45 AM  Result Value Ref Range   Specific Gravity, UA 1.010 1.005 - 1.030   pH, UA 7.5 5.0 - 7.5   Color, UA Yellow Yellow   Appearance Ur Cloudy (A) Clear   Leukocytes,UA 3+ (A) Negative   Protein,UA Negative Negative/Trace   Glucose, UA Negative Negative   Ketones, UA Negative Negative   RBC, UA 1+ (A) Negative   Bilirubin, UA Negative Negative   Urobilinogen, Ur 0.2 0.2 - 1.0 mg/dL   Nitrite, UA Negative Negative   Microscopic Examination See below:   Microscopic Examination     Status: Abnormal   Collection Time: 09/12/19  9:45 AM   URINE  Result Value Ref Range   WBC, UA >30 (A) 0 - 5 /hpf   RBC 3-10 (A) 0 - 2 /hpf   Epithelial Cells (non renal) 0-10 0 - 10 /hpf   Crystals Present (A) N/A   Crystal Type Amorphous Sediment N/A   Bacteria, UA Many (A) None seen/Few  Basic metabolic panel     Status: Abnormal   Collection Time: 09/24/19  4:05 PM  Result Value Ref Range   Sodium 132 (L) 135 - 145 mmol/L   Potassium 3.6 3.5 - 5.1 mmol/L   Chloride 94 (L) 98 - 111 mmol/L   CO2 27 22 - 32 mmol/L  Glucose, Bld 101 (H) 70 - 99 mg/dL   BUN 9 8 - 23 mg/dL   Creatinine, Ser 0.61 0.44 - 1.00 mg/dL   Calcium 9.8 8.9 - 10.3 mg/dL   GFR calc non Af Amer >60 >60 mL/min   GFR calc Af Amer >60 >60 mL/min   Anion gap 11 5 - 15    Comment: Performed at [facility], [address] [place], [place] [address]  CBC     Status: Abnormal   Collection Time: 09/24/19  4:05 PM  Result Value Ref Range   WBC 6.3 4.0 - 10.5 K/uL   RBC 4.66 3.87 - 5.11 MIL/uL   Hemoglobin 11.8 (L) 12.0 - 15.0 g/dL   HCT 35.4 (L) 36.0 - 46.0 %   MCV 76.0 (L) 80.0 - 100.0 fL   MCH 25.3 (L) 26.0 - 34.0 pg   MCHC 33.3 30.0 - 36.0 g/dL   RDW 15.9 (H) 11.5 - 15.5 %   Platelets 215 150 - 400 K/uL   nRBC 0.0 0.0 - 0.2 %    Comment: Performed at [facility], [address] [place], [place] [address]  Troponin I (High Sensitivity)     Status: None   Collection Time: 09/24/19  4:05 PM  Result Value Ref Range   Troponin I (High Sensitivity) 7 <18 ng/L    Comment: (NOTE) Elevated high sensitivity troponin I (hsTnI) values and significant  changes across serial measurements may suggest ACS but many other  chronic and acute conditions are known to elevate hsTnI results.  Refer to the "Links" section for chest pain algorithms and additional  guidance. Performed at [facility], [address] [place], [place] [address]   Brain natriuretic peptide     Status: Abnormal   Collection Time: 09/24/19  4:05 PM  Result Value Ref Range   B Natriuretic Peptide 213.0 (H) 0.0 - 100.0 pg/mL    Comment: Performed at [facility], [address] [place], [place] [address]  Urinalysis, Complete w Microscopic     Status: Abnormal   Collection Time: 09/24/19  4:33 PM  Result Value Ref Range   Color, Urine YELLOW (A) YELLOW   APPearance HAZY (A) CLEAR   Specific Gravity, Urine 1.006 1.005 - 1.030   pH 8.0 5.0 - 8.0   Glucose, UA NEGATIVE NEGATIVE mg/dL   Hgb urine dipstick NEGATIVE NEGATIVE   Bilirubin Urine NEGATIVE NEGATIVE   Ketones, ur NEGATIVE NEGATIVE mg/dL   Protein, ur NEGATIVE NEGATIVE mg/dL   Nitrite NEGATIVE NEGATIVE   Leukocytes,Ua NEGATIVE NEGATIVE   RBC / HPF 0-5 0 - 5 RBC/hpf   WBC, UA 0-5 0 - 5 WBC/hpf   Bacteria, UA NONE SEEN NONE SEEN   Squamous Epithelial / LPF NONE SEEN 0 - 5   Amorphous Crystal PRESENT     Comment: Performed at [facility], [address] [place], [place] [address]  CBC with Differential/Platelet     Status: Abnormal   Collection Time: 09/29/19  3:30 PM  Result Value Ref Range   WBC 10.1 4.0 - 10.5 K/uL   RBC 4.39 3.87 - 5.11 MIL/uL   Hemoglobin 11.4 (L) 12.0 - 15.0 g/dL   HCT 34.8 (L) 36.0 - 46.0 %   MCV 79.3 (L) 80.0 - 100.0 fL   MCH 26.0 26.0 - 34.0 pg   MCHC  32.8 30.0 - 36.0 g/dL   RDW 15.7 (H) 11.5 - 15.5 %   Platelets 191 150 -  400 K/uL   nRBC 0.0 0.0 - 0.2 %   Neutrophils Relative % 87 %   Neutro Abs 8.6 (H) 1.7 - 7.7 K/uL   Lymphocytes Relative 11 %   Lymphs Abs 1.1 0.7 - 4.0 K/uL   Monocytes Relative 2 %   Monocytes Absolute 0.2 0.1 - 1.0 K/uL   Eosinophils Relative 0 %   Eosinophils Absolute 0.0 0.0 - 0.5 K/uL   Basophils Relative 0 %   Basophils Absolute 0.0 0.0 - 0.1 K/uL   Immature Granulocytes 0 %   Abs Immature Granulocytes 0.04 0.00 - 0.07 K/uL    Comment: Performed at [facility], [address] [place], [place] [address]  Comprehensive metabolic panel     Status: Abnormal   Collection Time: 09/29/19  3:30 PM  Result Value Ref Range   Sodium 128 (L) 135 - 145 mmol/L   Potassium 3.5 3.5 - 5.1 mmol/L   Chloride 94 (L) 98 - 111 mmol/L   CO2 24 22 - 32 mmol/L   Glucose, Bld 110 (H) 70 - 99 mg/dL   BUN 16 8 - 23 mg/dL   Creatinine, Ser 0.76 0.44 - 1.00 mg/dL   Calcium 9.5 8.9 - 10.3 mg/dL   Total Protein 6.1 (L) 6.5 - 8.1 g/dL   Albumin 3.6 3.5 - 5.0 g/dL   AST 24 15 - 41 U/L   ALT 12 0 - 44 U/L   Alkaline Phosphatase 60 38 - 126 U/L   Total Bilirubin 1.6 (H) 0.3 - 1.2 mg/dL   GFR calc non Af Amer >60 >60 mL/min   GFR calc Af Amer >60 >60 mL/min   Anion gap 10 5 - 15    Comment: Performed at [facility], [address] [place], [place] [address]  CULTURE, URINE COMPREHENSIVE     Status: Abnormal   Collection Time: 10/18/19 10:40 AM   UR  Result Value Ref Range   Urine Culture, Comprehensive Final report (A)    Organism ID, Bacteria Escherichia coli (A)     Comment: Greater than 100,000 colony forming units per mL Cefazolin <=4 ug/mL Cefazolin with an MIC <=16 predicts susceptibility to the oral agents cefaclor, cefdinir, cefpodoxime, cefprozil, cefuroxime, cephalexin, and loracarbef when used for therapy of uncomplicated urinary tract infections due to E. coli, Klebsiella pneumoniae, and  Proteus mirabilis.    ANTIMICROBIAL SUSCEPTIBILITY Comment     Comment:       ** S = Susceptible; I = Intermediate; R = Resistant **                    P = Positive; N = Negative             MICS are expressed in micrograms per mL    Antibiotic                 RSLT#1    RSLT#2    RSLT#3    RSLT#4 Amoxicillin/Clavulanic Acid    S Ampicillin                     R Cefepime                       S Ceftriaxone                    S Cefuroxime  S Ciprofloxacin                  R Ertapenem                      S Gentamicin                     S Imipenem                       S Levofloxacin                   R Meropenem                      S Nitrofurantoin                 S Piperacillin/Tazobactam        S Tetracycline                   S Tobramycin                     S Trimethoprim/Sulfa             S   Urinalysis, Complete     Status: Abnormal   Collection Time: 10/18/19  4:33 PM  Result Value Ref Range   Specific Gravity, UA 1.020 1.005 - 1.030   pH, UA 7.0 5.0 - 7.5   Color, UA Yellow Yellow   Appearance Ur Cloudy (A) Clear   Leukocytes,UA 1+ (A) Negative   Protein,UA Negative Negative/Trace   Glucose, UA Negative Negative   Ketones, UA Negative Negative   RBC, UA Negative Negative   Bilirubin, UA Negative Negative   Urobilinogen, Ur 1.0 0.2 - 1.0 mg/dL   Nitrite, UA Positive (A) Negative   Microscopic Examination See below:   Microscopic Examination     Status: Abnormal   Collection Time: 10/18/19  4:33 PM   URINE  Result Value Ref Range   WBC, UA 11-30 (A) 0 - 5 /hpf   RBC None seen 0 - 2 /hpf   Epithelial Cells (non renal) None seen 0 - 10 /hpf   Bacteria, UA Many (A) None seen/Few    -------------------------------------------------------------------------- A&P:  Problem List Items Addressed This Visit    None    Visit Diagnoses    Acute left otitis media    -  Primary   Relevant Medications   amoxicillin (AMOXIL) 500 MG capsule    ofloxacin (FLOXIN) 0.3 % OTIC solution     Acute L AOM, history of recurrent issue Previously multiple recurrent complex AOM, has had documented allergic reaction to most antibiotics including amoxicillin. Patient requests to take the Amoxicillin because it has done well for her most times and usually does not cause any problem. - There was discrepancy on chart that said patient and family thought the last allergic reaction in 09/2019 took her to ED was caused by Cipro actually instead  Plan Initial plan was to trial amoxicillin 500 BID x 10 days, for AOM Advise caution with amoxicillin if develop acute allergic reaction needs to seek care immediately at hospital or ED if short of breath or acute allergic reaction  However, after further review and discussion with patient's daughter [name], we agreed to mutually switch to Oflacacin Otic ear drops instead of oral Amoxicillin to start with due to potential risk of allergy. She did well with these  ear drops before and was recommended by her ENT Dr Pryor Ochoa and prior pcp Cassell Smiles, AGPCNP-BC in past.  We called [facility] Amoxicillin. New rx ofloxacin otic sent and then if not improved we can reconsider oral Amoxicillin later this week or OTHER antibiotic perhaps such as doxycycline  Meds ordered this encounter  Medications  . amoxicillin (AMOXIL) 500 MG capsule    DISCONTINUED          . ofloxacin (FLOXIN) 0.3 % OTIC solution    Sig: Place 5 drops into the left ear 2 (two) times daily. For 7 days    Dispense:  5 mL    Refill:  0    Discontinued Amoxicillin oral, switched to Ofloxacin Otic instead.    Follow-up: - Return as needed  Patient verbalizes understanding with the above medical recommendations including the limitation of remote medical advice.  Specific follow-up and call-back criteria were given for patient to follow-up or seek medical care more urgently if needed.   - Time spent in direct consultation with  patient on phone: 9 minutes   [name], [facility] 10/30/2019, 2:54 PM

## 2019-10-31 ENCOUNTER — Other Ambulatory Visit: Payer: Self-pay | Admitting: *Deleted

## 2019-10-31 ENCOUNTER — Other Ambulatory Visit: Payer: Self-pay | Admitting: Urology

## 2019-10-31 DIAGNOSIS — R339 Retention of urine, unspecified: Secondary | ICD-10-CM

## 2019-11-13 ENCOUNTER — Ambulatory Visit (INDEPENDENT_AMBULATORY_CARE_PROVIDER_SITE_OTHER): Payer: Medicare Other | Admitting: Licensed Clinical Social Worker

## 2019-11-13 ENCOUNTER — Ambulatory Visit: Payer: Medicare Other | Admitting: Family Medicine

## 2019-11-13 DIAGNOSIS — M1990 Unspecified osteoarthritis, unspecified site: Secondary | ICD-10-CM

## 2019-11-13 DIAGNOSIS — R413 Other amnesia: Secondary | ICD-10-CM

## 2019-11-13 DIAGNOSIS — M1710 Unilateral primary osteoarthritis, unspecified knee: Secondary | ICD-10-CM

## 2019-11-13 DIAGNOSIS — F411 Generalized anxiety disorder: Secondary | ICD-10-CM

## 2019-11-13 DIAGNOSIS — I10 Essential (primary) hypertension: Secondary | ICD-10-CM

## 2019-11-13 DIAGNOSIS — M179 Osteoarthritis of knee, unspecified: Secondary | ICD-10-CM

## 2019-11-14 NOTE — Chronic Care Management (AMB) (Signed)
Chronic Care Management    Clinical Social Work Follow Up Note  11/14/2019 Name: [name] MRN: [id number] DOB: June 06, 1925  [name] is a 86 y.o. year old female who is a primary care patient of Olin Hauser, DO. The CCM team was consulted for assistance with Caregiver Stress.   Review of patient status, including review of consultants reports, other relevant assessments, and collaboration with appropriate care team members and the patient's provider was performed as part of comprehensive patient evaluation and provision of chronic care management services.    SDOH (Social Determinants of Health) screening performed today: Physical Activity. See Care Plan for related entries.   Advanced Directives Status: <no information> See Care Plan for related entries.   Outpatient Encounter Medications as of 11/13/2019  Medication Sig Note  . amLODipine (NORVASC) 5 MG tablet Take 1 tablet (5 mg total) by mouth daily.   [name] amoxicillin (AMOXIL) 500 MG capsule Take 1 capsule (500 mg total) by mouth 2 (two) times daily. For 10 days   . carvedilol (COREG) 6.25 MG tablet Take 1 tablet (6.25 mg total) by mouth 2 (two) times daily with a meal.   . diphenhydrAMINE (BENADRYL) 25 MG tablet Take 1 tablet (25 mg total) by mouth every 6 (six) hours as needed for itching or allergies. (Patient not taking: Reported on 10/30/2019)   . EPINEPHrine 0.3 mg/0.3 mL IJ SOAJ injection    . FLUoxetine (PROZAC) 20 MG capsule Take 1 capsule (20 mg total) by mouth daily.   . furosemide (LASIX) 20 MG tablet Take 1 tablet po QD prn   . HYDROcodone-acetaminophen (NORCO) 10-325 MG tablet Take 1 tablet by mouth every 4 (four) hours as needed for moderate pain.  07/29/2018: Takes fairly regularly   . LORazepam (ATIVAN) 0.5 MG tablet Take 1 tablet (0.5 mg total) by mouth 3 (three) times daily as needed for anxiety.   . methylPREDNISolone (MEDROL) 4 MG TBPK tablet Follow package instruction for steroid taper.  (Patient not taking: Reported on 10/30/2019)   . mirtazapine (REMERON) 30 MG tablet Take 1 tablet (30 mg total) by mouth at bedtime.   [name] ofloxacin (FLOXIN) 0.3 % OTIC solution Place 5 drops into the left ear 2 (two) times daily. For 7 days   . ondansetron (ZOFRAN) 4 MG tablet Take 1 tablet (4 mg total) by mouth every 8 (eight) hours as needed for nausea. (Patient not taking: Reported on 10/30/2019)   . phenazopyridine (PYRIDIUM) 100 MG tablet Take 1 tablet (100 mg total) by mouth 3 (three) times daily as needed (bladder pain). (Patient not taking: Reported on 10/30/2019)   . tamsulosin (FLOMAX) 0.4 MG CAPS capsule Take 1 capsule (0.4 mg total) by mouth daily.    No facility-administered encounter medications on file as of 11/13/2019.     Goals Addressed    . SW-"we need help with mom's care" (pt-stated)       Current Barriers:  [name] Knowledge Deficits related to community resources to assist with patient's care . Lacks caregiver support.  . Film/video editor.  . Cognitive Deficits . No Advanced Directives in place . Ongoing depression/anxiety  Licensed Clinical Social Worker Goal(s):  [name] Over the next 90 days, patient will work with LCSW to address needs related to Caregiver strain/mental health management   Interventions: ALL INTERVENTIONS DISCUSSED WITH DAUGHTER-[name] . Evaluation of current treatment plan related to HTN, GAD, Chronic pain, UTIs and patient's adherence to plan as established by provider. . Discussed plans with patient for  ongoing care management follow up and provided patient with direct contact information for care management team . CCM RNCM referral for follow up with patient about her health care/recent allergic reactions/unknown causes  . Talked with daughters [name] and [name] at length concerning the patient's care. She expresses the great exhaustion both daughters are experiencing caring for their mother and trying to continue working. However, things have improved  since hiring new caregiver.  . Family report patient's mood has improved since socialization gained with new caregiver as well. . Discussed the benefit of Palliative care to have NP come to the patient's home to evaluate patient and assist with symptom management.    Patient Self Care Activities:  . Calls provider office for new concerns or questions inappropriately at times  . Unable to independently care for self . Does not attend all scheduled provider appointments . Unable to perform IADLs independently . Vision impairment . Hearing impairment  Please see past updates related to this goal by clicking on the "Past Updates" button in the selected goal        Follow Up Plan: SW will follow up with patient by phone over the next quarter  [name], [place], MSW, [email or web address] Phone: [phone]

## 2019-11-22 ENCOUNTER — Ambulatory Visit: Payer: Self-pay | Admitting: General Practice

## 2019-11-22 DIAGNOSIS — M1990 Unspecified osteoarthritis, unspecified site: Secondary | ICD-10-CM | POA: Diagnosis not present

## 2019-11-22 DIAGNOSIS — Z7689 Persons encountering health services in other specified circumstances: Secondary | ICD-10-CM

## 2019-11-22 DIAGNOSIS — M1710 Unilateral primary osteoarthritis, unspecified knee: Secondary | ICD-10-CM

## 2019-11-22 DIAGNOSIS — I10 Essential (primary) hypertension: Secondary | ICD-10-CM

## 2019-11-22 DIAGNOSIS — F411 Generalized anxiety disorder: Secondary | ICD-10-CM

## 2019-11-22 DIAGNOSIS — R413 Other amnesia: Secondary | ICD-10-CM

## 2019-11-22 NOTE — Patient Instructions (Signed)
Visit Information  Goals Addressed   None     The patient verbalized understanding of instructions provided today and declined a print copy of patient instruction materials.   The patient has been provided with contact information for the care management team and has been advised to call with any health related questions or concerns.   [name] RN, MSN, [place] Mobile: [phone]

## 2019-11-22 NOTE — Chronic Care Management (AMB) (Signed)
°  Chronic Care Management   Note  11/22/2019 Name: [name] MRN: [id number] DOB: Aug 22, 1925  Referral received from LCSW, Eula Fried for possible needs related to [facility] Chronic Disease management and care coordination needs.  Spoke with the daughter Amey Samara who is also DPR.  The daughter feels they are doing much better in caring for their mother now that they have a caregiver that comes into the home.  The daughter verbalized they had no immediate needs or concerns.  The daughter expressed thanks for the CCM team and verbalized she would call the [facility] for any questions or concerns in the future.   Follow up plan: The patient has been provided with contact information for the care management team and has been advised to call with any health related questions or concerns.   [name] RN, MSN, [place] Mobile: [phone]

## 2019-11-29 ENCOUNTER — Ambulatory Visit: Payer: Medicare Other | Admitting: Family Medicine

## 2019-12-10 ENCOUNTER — Other Ambulatory Visit: Payer: Self-pay | Admitting: Nurse Practitioner

## 2019-12-10 DIAGNOSIS — F411 Generalized anxiety disorder: Secondary | ICD-10-CM

## 2019-12-21 ENCOUNTER — Ambulatory Visit: Payer: Medicare Other | Admitting: Family Medicine

## 2020-01-08 DIAGNOSIS — M961 Postlaminectomy syndrome, not elsewhere classified: Secondary | ICD-10-CM | POA: Diagnosis not present

## 2020-01-08 DIAGNOSIS — M4316 Spondylolisthesis, lumbar region: Secondary | ICD-10-CM | POA: Diagnosis not present

## 2020-01-11 ENCOUNTER — Other Ambulatory Visit: Payer: Self-pay | Admitting: Family Medicine

## 2020-01-11 ENCOUNTER — Ambulatory Visit: Payer: Medicare Other

## 2020-01-11 DIAGNOSIS — F411 Generalized anxiety disorder: Secondary | ICD-10-CM

## 2020-01-11 MED ORDER — LORAZEPAM 0.5 MG PO TABS: 0.5000 mg | ORAL_TABLET | Freq: Three times a day (TID) | ORAL | 2 refills | Status: DC | PRN

## 2020-01-15 ENCOUNTER — Other Ambulatory Visit: Payer: Self-pay

## 2020-01-15 ENCOUNTER — Telehealth: Payer: Self-pay

## 2020-01-18 ENCOUNTER — Other Ambulatory Visit: Payer: Self-pay | Admitting: Nurse Practitioner

## 2020-01-18 DIAGNOSIS — I10 Essential (primary) hypertension: Secondary | ICD-10-CM

## 2020-02-04 ENCOUNTER — Other Ambulatory Visit: Payer: Self-pay | Admitting: Nurse Practitioner

## 2020-02-04 DIAGNOSIS — R601 Generalized edema: Secondary | ICD-10-CM

## 2020-02-13 ENCOUNTER — Other Ambulatory Visit: Payer: Self-pay | Admitting: Family Medicine

## 2020-02-13 DIAGNOSIS — I10 Essential (primary) hypertension: Secondary | ICD-10-CM

## 2020-02-13 NOTE — Telephone Encounter (Signed)
Requested Prescriptions  Pending Prescriptions Disp Refills  . carvedilol (COREG) 6.25 MG tablet [Pharmacy Med Name: CARVEDILOL 6.25 MG TABLET] 180 tablet 0    Sig: Take 1 tablet (6.25 mg total) by mouth 2 (two) times daily with a meal.     Cardiovascular:  Beta Blockers Passed - 02/13/2020 11:50 AM      Passed - Last BP in normal range    BP Readings from Last 1 Encounters:  10/30/19 101/79         Passed - Last Heart Rate in normal range    Pulse Readings from Last 1 Encounters:  10/30/19 95         Passed - Valid encounter within last 6 months    Recent Outpatient Visits          3 months ago Acute left otitis media   [place], [place]   6 months ago Acute cystitis without hematuria   [place], [place]   7 months ago [place], DO   8 months ago [facility] Mikey College, NP   10 months ago Acute cystitis without hematuria   [place], DO

## 2020-02-15 ENCOUNTER — Ambulatory Visit: Payer: Medicare Other

## 2020-02-21 ENCOUNTER — Ambulatory Visit: Payer: Self-pay

## 2020-02-21 NOTE — Chronic Care Management (AMB) (Signed)
  Care Management   Follow Up Note   02/21/2020 Name: [name] MRN: [id number] DOB: 01-05-1925  Referred by: Olin Hauser, DO Reason for referral : [place] is a 86 y.o. year old female who is a primary care patient of Olin Hauser, DO. The care management team was consulted for assistance with care management and care coordination needs.    Review of patient status, including review of consultants reports, relevant laboratory and other test results, and collaboration with appropriate care team members and the patient's provider was performed as part of comprehensive patient evaluation and provision of chronic care management services.    LCSW completed CCM outreach attempt today but was unable to reach patient successfully. A HIPPA compliant voice message was left encouraging patient to return call once available. LCSW rescheduled CCM SW appointment as well.  A HIPPA compliant phone message was left for the patient providing contact information and requesting a return call.   Eula Fried, BSW, MSW, [email or web address] Phone: [phone]

## 2020-02-28 ENCOUNTER — Other Ambulatory Visit: Payer: Self-pay | Admitting: Family Medicine

## 2020-02-28 DIAGNOSIS — F411 Generalized anxiety disorder: Secondary | ICD-10-CM

## 2020-02-28 NOTE — Telephone Encounter (Signed)
Requested medication (s) are due for refill today - yes  Requested medication (s) are on the active medication list -yes  Future visit scheduled -no  Last refill: 12/15/19  Notes to clinic: Request for medication that fails lab protocol  Requested Prescriptions  Pending Prescriptions Disp Refills   mirtazapine (REMERON) 30 MG tablet [Pharmacy Med Name: MIRTAZAPINE 30 MG TABLET] 90 tablet 0    Sig: Take 1 tablet (30 mg total) by mouth at bedtime.      Psychiatry: Antidepressants - mirtazapine Failed - 02/28/2020  4:01 PM      Failed - Triglycerides in normal range and within 360 days    No results found for: TRIG, POCTRIG        Failed - Total Cholesterol in normal range and within 360 days    No results found for: CHOL, POCCHOL, CHOLTOT        Passed - AST in normal range and within 360 days    AST  Date Value Ref Range Status  09/29/2019 24 15 - 41 U/L Final   SGOT(AST)  Date Value Ref Range Status  05/03/2014 17 15 - 37 Unit/L Final          Passed - ALT in normal range and within 360 days    ALT  Date Value Ref Range Status  09/29/2019 12 0 - 44 U/L Final   SGPT (ALT)  Date Value Ref Range Status  05/03/2014 21 U/L Final    Comment:    14-63 NOTE: New Reference Range 04/30/14           Passed - WBC in normal range and within 360 days    WBC  Date Value Ref Range Status  09/29/2019 10.1 4.0 - 10.5 K/uL Final          Passed - Valid encounter within last 6 months    Recent Outpatient Visits           4 months ago Acute left otitis media   [place], DO   6 months ago Acute cystitis without hematuria   [place], [place], [place]   8 months ago [place], DO   8 months ago [facility] Merrilyn Puma, Jerrel Ivory, NP   10 months ago Acute cystitis without hematuria   [place], DO                  Requested Prescriptions  Pending Prescriptions Disp Refills   mirtazapine (REMERON) 30 MG tablet [Pharmacy Med Name: MIRTAZAPINE 30 MG TABLET] 90 tablet 0    Sig: Take 1 tablet (30 mg total) by mouth at bedtime.      Psychiatry: Antidepressants - mirtazapine Failed - 02/28/2020  4:01 PM      Failed - Triglycerides in normal range and within 360 days    No results found for: TRIG, POCTRIG        Failed - Total Cholesterol in normal range and within 360 days    No results found for: CHOL, POCCHOL, CHOLTOT        Passed - AST in normal range and within 360 days    AST  Date Value Ref Range Status  09/29/2019 24 15 - 41 U/L Final   SGOT(AST)  Date Value Ref Range Status  05/03/2014 17 15 - 37 Unit/L Final  Passed - ALT in normal range and within 360 days    ALT  Date Value Ref Range Status  09/29/2019 12 0 - 44 U/L Final   SGPT (ALT)  Date Value Ref Range Status  05/03/2014 21 U/L Final    Comment:    14-63 NOTE: New Reference Range 04/30/14           Passed - WBC in normal range and within 360 days    WBC  Date Value Ref Range Status  09/29/2019 10.1 4.0 - 10.5 K/uL Final          Passed - Valid encounter within last 6 months    Recent Outpatient Visits           4 months ago Acute left otitis media   [place], DO   6 months ago Acute cystitis without hematuria   [place], [place]   8 months ago [place], DO   8 months ago [facility] Mikey College, NP   10 months ago Acute cystitis without hematuria   [place], DO

## 2020-02-29 ENCOUNTER — Ambulatory Visit: Payer: Medicare Other

## 2020-03-06 ENCOUNTER — Ambulatory Visit
Admission: RE | Admit: 2020-03-06 | Discharge: 2020-03-06 | Disposition: A | Discharge status: Home / Self Care | Payer: Medicare Other | Source: Ambulatory Visit | Attending: Nurse Practitioner | Admitting: Nurse Practitioner

## 2020-03-06 ENCOUNTER — Other Ambulatory Visit: Payer: Self-pay

## 2020-03-06 DIAGNOSIS — Z0189 Encounter for other specified special examinations: Secondary | ICD-10-CM | POA: Insufficient documentation

## 2020-03-06 DIAGNOSIS — E041 Nontoxic single thyroid nodule: Secondary | ICD-10-CM | POA: Diagnosis not present

## 2020-03-07 ENCOUNTER — Other Ambulatory Visit: Payer: Self-pay | Admitting: Family Medicine

## 2020-03-07 DIAGNOSIS — F411 Generalized anxiety disorder: Secondary | ICD-10-CM

## 2020-03-18 ENCOUNTER — Encounter: Payer: Self-pay | Admitting: Family Medicine

## 2020-03-18 ENCOUNTER — Telehealth: Payer: Self-pay

## 2020-03-18 DIAGNOSIS — E042 Nontoxic multinodular goiter: Secondary | ICD-10-CM | POA: Insufficient documentation

## 2020-03-18 DIAGNOSIS — C73 Malignant neoplasm of thyroid gland: Secondary | ICD-10-CM | POA: Insufficient documentation

## 2020-03-18 NOTE — Telephone Encounter (Signed)
I reviewed the chart. Here is what is going on.  Previously followed by Cassell Smiles, AGPCNP-BC.  Back in September 2020, the patient had a CT Neck Scan.  The CT neck showed a large neck mass of Left thyroid:  Large 4.5 x 4.2 mass appearing to arise from the left thyroid lobe with geographic areas of mixed hyper and hypoattenuation. Recommend further evaluation with thyroid ultrasound.  It looks like Lauren ordered the Thyroid Ultrasound back on 06/26/19.  I am not sure why it was not completed sooner.  The results did not go to my results. Most likely they were sent to Lauren or we did not receive them. However they are on Epic now and I can view it.  ---------------------------------  Please share thyroid ultrasound results with patient  Thyroid ultrasound results show:  Multiple thyroid nodules (goiter) and Mass of Left thyroid.  The approximately 8.2 cm mass replacing the left lobe of the thyroid, correlating with the findings on preceding neck CT performed 06/2019, meets imaging criteria to recommend percutaneous sampling as clinically indicated.  She will need to see a specialist for the biopsy of the thyroid mass and to follow up on the thyroid nodules  She has seen [facility] before for her ears, they can also assist with Thyroid biopsy. She may contact them to schedule OR if needed we can place new referral for this problem.  She may need a Thyroid specialist in future as well depending on what the result of biopsy shows.  [name], [facility] 03/18/2020, 5:19 PM

## 2020-03-18 NOTE — Telephone Encounter (Signed)
Copied from [place] [id number]. Topic: General - Inquiry >> Mar 18, 2020 11:17 AM Mathis Bud wrote: Reason for CRM: Patient daughter [name] called requesting to get mothers lab results from May 27th. Call back [phone]

## 2020-03-18 NOTE — Telephone Encounter (Signed)
[name] that have not received any lab result she can tell them to refax it ?

## 2020-03-19 NOTE — Telephone Encounter (Signed)
[name] informed as per Dr. Raliegh Ip also blood test was from the ED visit advised that we don't see any result and she will schedule physical apt for future.

## 2020-03-20 NOTE — Telephone Encounter (Signed)
Pt's daughter called in to inform PCP that patient is going to [facility] on June 30th and they are requesting a copy of the scan of patient's thyroid. Please  Advise.

## 2020-03-20 NOTE — Telephone Encounter (Signed)
Faxed to ENT

## 2020-04-03 DIAGNOSIS — M25551 Pain in right hip: Secondary | ICD-10-CM | POA: Diagnosis not present

## 2020-04-03 DIAGNOSIS — M4316 Spondylolisthesis, lumbar region: Secondary | ICD-10-CM | POA: Diagnosis not present

## 2020-04-03 DIAGNOSIS — M961 Postlaminectomy syndrome, not elsewhere classified: Secondary | ICD-10-CM | POA: Diagnosis not present

## 2020-04-03 DIAGNOSIS — M545 Low back pain: Secondary | ICD-10-CM | POA: Diagnosis not present

## 2020-04-03 DIAGNOSIS — M25561 Pain in right knee: Secondary | ICD-10-CM | POA: Diagnosis not present

## 2020-04-04 ENCOUNTER — Ambulatory Visit: Payer: Medicare Other | Admitting: Physician Assistant

## 2020-04-08 ENCOUNTER — Other Ambulatory Visit: Payer: Self-pay | Admitting: Family Medicine

## 2020-04-08 DIAGNOSIS — F411 Generalized anxiety disorder: Secondary | ICD-10-CM

## 2020-04-08 NOTE — Telephone Encounter (Signed)
Requested medication (s) are due for refill today: yes  Requested medication (s) are on the active medication list: yes  Last refill:  01/11/20  Future visit scheduled: no  Notes to clinic:  med not delegated to NT to RF   Requested Prescriptions  Pending Prescriptions Disp Refills   LORazepam (ATIVAN) 0.5 MG tablet [Pharmacy Med Name: LORAZEPAM 0.5 MG TABLET] 90 tablet 0    Sig: Take 1 tablet (0.5 mg total) by mouth 3 (three) times daily as needed for anxiety.      Not Delegated - Psychiatry:  Anxiolytics/Hypnotics Failed - 04/08/2020 10:10 AM      Failed - This refill cannot be delegated      Failed - Urine Drug Screen completed in last 360 days.      Passed - Valid encounter within last 6 months    Recent Outpatient Visits           5 months ago Acute left otitis media   [place], DO   8 months ago Acute cystitis without hematuria   [place], [place]   9 months ago [place], DO   9 months ago [facility] Merrilyn Puma, Jerrel Ivory, NP   1 year ago Acute cystitis without hematuria   [place], DO

## 2020-04-16 ENCOUNTER — Emergency Department: Payer: Medicare Other

## 2020-04-16 ENCOUNTER — Emergency Department
Admission: EM | Admit: 2020-04-16 | Discharge: 2020-04-17 | Disposition: A | Discharge status: Home / Self Care | Payer: Medicare Other | Attending: Emergency Medicine | Admitting: Emergency Medicine

## 2020-04-16 ENCOUNTER — Other Ambulatory Visit: Payer: Self-pay

## 2020-04-16 DIAGNOSIS — M4316 Spondylolisthesis, lumbar region: Secondary | ICD-10-CM | POA: Diagnosis not present

## 2020-04-16 DIAGNOSIS — I10 Essential (primary) hypertension: Secondary | ICD-10-CM | POA: Diagnosis not present

## 2020-04-16 DIAGNOSIS — Z9889 Other specified postprocedural states: Secondary | ICD-10-CM | POA: Diagnosis not present

## 2020-04-16 DIAGNOSIS — M533 Sacrococcygeal disorders, not elsewhere classified: Secondary | ICD-10-CM | POA: Diagnosis not present

## 2020-04-16 DIAGNOSIS — M79605 Pain in left leg: Secondary | ICD-10-CM | POA: Diagnosis not present

## 2020-04-16 DIAGNOSIS — Z981 Arthrodesis status: Secondary | ICD-10-CM | POA: Diagnosis not present

## 2020-04-16 DIAGNOSIS — N390 Urinary tract infection, site not specified: Secondary | ICD-10-CM | POA: Insufficient documentation

## 2020-04-16 DIAGNOSIS — R600 Localized edema: Secondary | ICD-10-CM | POA: Diagnosis not present

## 2020-04-16 DIAGNOSIS — W19XXXA Unspecified fall, initial encounter: Secondary | ICD-10-CM | POA: Diagnosis not present

## 2020-04-16 DIAGNOSIS — M25559 Pain in unspecified hip: Secondary | ICD-10-CM

## 2020-04-16 DIAGNOSIS — Z79899 Other long term (current) drug therapy: Secondary | ICD-10-CM | POA: Insufficient documentation

## 2020-04-16 DIAGNOSIS — R52 Pain, unspecified: Secondary | ICD-10-CM | POA: Diagnosis not present

## 2020-04-16 DIAGNOSIS — M25551 Pain in right hip: Secondary | ICD-10-CM | POA: Insufficient documentation

## 2020-04-16 DIAGNOSIS — M79604 Pain in right leg: Secondary | ICD-10-CM | POA: Diagnosis not present

## 2020-04-16 DIAGNOSIS — R102 Pelvic and perineal pain: Secondary | ICD-10-CM | POA: Diagnosis not present

## 2020-04-16 DIAGNOSIS — M25561 Pain in right knee: Secondary | ICD-10-CM | POA: Diagnosis not present

## 2020-04-16 DIAGNOSIS — R319 Hematuria, unspecified: Secondary | ICD-10-CM | POA: Diagnosis not present

## 2020-04-16 DIAGNOSIS — M79661 Pain in right lower leg: Secondary | ICD-10-CM | POA: Diagnosis not present

## 2020-04-16 DIAGNOSIS — M810 Age-related osteoporosis without current pathological fracture: Secondary | ICD-10-CM | POA: Diagnosis not present

## 2020-04-16 LAB — URINALYSIS, COMPLETE (UACMP) WITH MICROSCOPIC
Bilirubin Urine: NEGATIVE
Glucose, UA: NEGATIVE mg/dL
Hgb urine dipstick: NEGATIVE
Ketones, ur: NEGATIVE mg/dL
Nitrite: NEGATIVE
Protein, ur: NEGATIVE mg/dL
Specific Gravity, Urine: 1.005 (ref 1.005–1.030)
Squamous Epithelial / HPF: NONE SEEN (ref 0–5)
pH: 7 (ref 5.0–8.0)

## 2020-04-16 LAB — COMPREHENSIVE METABOLIC PANEL
ALT: 12 U/L (ref 0–44)
AST: 22 U/L (ref 15–41)
Albumin: 4 g/dL (ref 3.5–5.0)
Alkaline Phosphatase: 74 U/L (ref 38–126)
Anion gap: 9 (ref 5–15)
BUN: 11 mg/dL (ref 8–23)
CO2: 27 mmol/L (ref 22–32)
Calcium: 9.6 mg/dL (ref 8.9–10.3)
Chloride: 102 mmol/L (ref 98–111)
Creatinine, Ser: 0.61 mg/dL (ref 0.44–1.00)
GFR calc Af Amer: >60 mL/min (ref >60)
GFR calc non Af Amer: >60 mL/min (ref >60)
Glucose, Bld: 96 mg/dL (ref 70–99)
Potassium: 3.4 mmol/L — ABNORMAL LOW (ref 3.5–5.1)
Sodium: 138 mmol/L (ref 135–145)
Total Bilirubin: 0.8 mg/dL (ref 0.3–1.2)
Total Protein: 6.9 g/dL (ref 6.5–8.1)

## 2020-04-16 LAB — CBC WITH DIFFERENTIAL/PLATELET
Abs Immature Granulocytes: 0.01 10*3/uL (ref 0.00–0.07)
Basophils Absolute: 0.1 10*3/uL (ref 0.0–0.1)
Basophils Relative: 1 %
Eosinophils Absolute: 0.3 10*3/uL (ref 0.0–0.5)
Eosinophils Relative: 5 %
HCT: 32.3 % — ABNORMAL LOW (ref 36.0–46.0)
Hemoglobin: 10.7 g/dL — ABNORMAL LOW (ref 12.0–15.0)
Immature Granulocytes: 0 %
Lymphocytes Relative: 38 %
Lymphs Abs: 2.6 10*3/uL (ref 0.7–4.0)
MCH: 24.8 pg — ABNORMAL LOW (ref 26.0–34.0)
MCHC: 33.1 g/dL (ref 30.0–36.0)
MCV: 74.8 fL — ABNORMAL LOW (ref 80.0–100.0)
Monocytes Absolute: 0.8 10*3/uL (ref 0.1–1.0)
Monocytes Relative: 12 %
Neutro Abs: 3 10*3/uL (ref 1.7–7.7)
Neutrophils Relative %: 44 %
Platelets: 192 10*3/uL (ref 150–400)
RBC: 4.32 MIL/uL (ref 3.87–5.11)
RDW: 15.8 % — ABNORMAL HIGH (ref 11.5–15.5)
WBC: 6.8 10*3/uL (ref 4.0–10.5)
nRBC: 0 % (ref 0.0–0.2)

## 2020-04-16 LAB — SEDIMENTATION RATE: Sed Rate: 9 mm/hr (ref 0–30)

## 2020-04-16 MED ORDER — HYDROCODONE-ACETAMINOPHEN 5-325 MG PO TABS
1.0000 | ORAL_TABLET | Freq: Once | ORAL | Status: AC
  Administered 2020-04-16: 1 via ORAL
  Filled 2020-04-16: qty 1

## 2020-04-16 MED ORDER — ONDANSETRON HCL 4 MG/2ML IJ SOLN: 4.0000 mg | Freq: Once | INTRAMUSCULAR | Status: DC

## 2020-04-16 MED ORDER — FOSFOMYCIN TROMETHAMINE 3 G PO PACK
3.0000 g | PACK | Freq: Once | ORAL | Status: AC
  Administered 2020-04-16: 3 g via ORAL
  Filled 2020-04-16: qty 3

## 2020-04-16 MED ORDER — PHENAZOPYRIDINE HCL 100 MG PO TABS
100.0000 mg | ORAL_TABLET | Freq: Once | ORAL | Status: DC
  Filled 2020-04-16: qty 1

## 2020-04-16 MED ORDER — FENTANYL CITRATE (PF) 100 MCG/2ML IJ SOLN: 50.0000 ug | Freq: Once | INTRAMUSCULAR | Status: DC

## 2020-04-16 NOTE — ED Triage Notes (Signed)
See first nurse note. Daughter reports pt has been crying and screaming in pain when moving the right leg despite being on pain medication. Daughter states she called her pain management doctor yesterday and was told to call her PCP so daughter brought patient here. Daughter reports pt has been crying all morning. Pt sitting in wheelchair in NAD at this time, skin warm and dry

## 2020-04-16 NOTE — ED Notes (Signed)
Patient was taken to the shower room and linens were changed after voiding in the stretcher. Patient was cleaned, brief and blue scrub pants were put on. Patient was repositioned on stretcher. Daughter remains at bedside.

## 2020-04-16 NOTE — ED Provider Notes (Signed)
[facility] Emergency Department Provider Note   ____________________________________________   First MD Initiated Contact with Patient 04/16/20 1711     (approximate)  I have reviewed the triage vital signs and the nursing notes.   HISTORY  Chief Complaint Fall    HPI [name] is a 86 y.o. female patient has been complaining of pain in the right leg.  Especially in the right hip today it was very bad.  Pain medicine is not helping by mouth.  [name] reports that patient said that she seems to have hurt her leg when they were getting in and out of the car going to the doctor.  Patient had surgery in the past on her back and has a lot of metal in there.       Past Medical History:  Diagnosis Date  . Allergy    environmental  . Anxiety   . Chronic back pain   . High cholesterol   . History of urinary retention   . Hypertension   . Migraines   . Osteoporosis   . Pneumonia   . Sinusitis     Patient Active Problem List   Diagnosis Date Noted  . Multinodular goiter 03/18/2020  . Thyroid mass 03/18/2020  . Macular degeneration 09/04/2018  . Cervical lymphadenopathy 07/18/2018  . Pain in right hip 07/18/2018  . Lymphadenopathy 06/25/2018  . Stomatitis and mucositis 06/25/2018  . Shortness of breath 02/16/2018  . Arm fracture, left 01/03/2018  . Arthritis 01/03/2018  . Compression fracture of thoracic vertebra ([place]) 01/03/2018  . Emphysema lung ([place]) 01/03/2018  . GERD (gastroesophageal reflux disease) 01/03/2018  . GI bleeding 01/03/2018  . Lumbar spondylosis 01/03/2018  . Menstrual problem 01/03/2018  . Osteoarthritis of knee 01/03/2018  . Osteoporosis 01/03/2018  . Acute pain of right knee 01/03/2018  . Synovial cyst of popliteal space 01/03/2018  . History of urinary retention 11/10/2017  . Generalized anxiety disorder 10/24/2017  . Constipation 10/24/2017  . Internal hemorrhoids 10/24/2017  . Essential (primary)  hypertension 10/24/2017  . Thrombocytopenia ([place]) 10/24/2017  . Acute encephalopathy 08/04/2017  . Hyponatremia 12/15/2015    Past Surgical History:  Procedure Laterality Date  . ABDOMINAL HYSTERECTOMY    . APPENDECTOMY    . BACK SURGERY    . BACK SURGERY  2008   2009  . BREAST BIOPSY Right 2002   neg  . HEMORRHOID SURGERY    . MOUTH SURGERY  2019  . TONSILLECTOMY      Prior to Admission medications   Medication Sig Start Date End Date Taking? Authorizing Provider  amLODipine (NORVASC) 5 MG tablet Take 1 tablet (5 mg total) by mouth daily. 01/20/20   Karamalegos, [name], DO  carvedilol (COREG) 6.25 MG tablet Take 1 tablet (6.25 mg total) by mouth 2 (two) times daily with a meal. 02/13/20   Karamalegos, [name], DO  diphenhydrAMINE (BENADRYL) 25 MG tablet Take 1 tablet (25 mg total) by mouth every 6 (six) hours as needed for itching or allergies. Patient not taking: Reported on 10/30/2019 09/29/19   Carrie Mew, MD  EPINEPHrine 0.3 mg/0.3 mL IJ SOAJ injection  09/30/19   [provider]  FLUoxetine (PROZAC) 20 MG capsule Take 1 capsule (20 mg total) by mouth daily. 03/07/20   Karamalegos, [name], DO  furosemide (LASIX) 20 MG tablet TAKE 1 TABLET DAILY AS NEEDED 02/04/20   Par[place] Ranger, [name], DO  HYDROcodone-acetaminophen (NORCO) 10-325 MG tablet Take 1 tablet by mouth every  4 (four) hours as needed for moderate pain.     [provider]  LORazepam (ATIVAN) 0.5 MG tablet Take 1 tablet (0.5 mg total) by mouth 3 (three) times daily as needed for anxiety. 04/08/20   Karamalegos, [name], DO  methylPREDNISolone (MEDROL) 4 MG TBPK tablet Follow package instruction for steroid taper. Patient not taking: Reported on 10/30/2019 09/29/19   Carrie Mew, MD  mirtazapine (REMERON) 30 MG tablet Take 1 tablet (30 mg total) by mouth at bedtime. 02/29/20   Karamalegos, [name], DO  ofloxacin (FLOXIN) 0.3 % OTIC solution Place 5 drops into the left ear  2 (two) times daily. For 7 days 10/30/19   Olin Hauser, DO  ondansetron (ZOFRAN) 4 MG tablet Take 1 tablet (4 mg total) by mouth every 8 (eight) hours as needed for nausea. Patient not taking: Reported on 10/30/2019 06/15/19   Mikey College, NP  phenazopyridine (PYRIDIUM) 100 MG tablet Take 1 tablet (100 mg total) by mouth 3 (three) times daily as needed (bladder pain). Patient not taking: Reported on 10/30/2019 07/12/19   Mikey College, NP  tamsulosin (FLOMAX) 0.4 MG CAPS capsule Take 1 capsule (0.4 mg total) by mouth daily. 10/31/19   Debroah Loop, PA-C    Allergies Amoxicillin, Ceftin [cefuroxime], Cortisone, Fluticasone, Levofloxacin, Meperidine, Morphine and related, Morphine sulfate, Nisoldipine, Penicillin g potassium in d5w, Prednisone, Sulfa antibiotics, Cephalexin, and Ciprofloxacin  Family History  Problem Relation Age of Onset  . Breast cancer Sister 70  . Breast cancer Maternal Grandmother 23  . Breast cancer Sister 23  . Bladder Cancer Brother   . Hypertension Neg Hx   . Kidney cancer Neg Hx     Social History Social History   Tobacco Use  . Smoking status: Never Smoker  . Smokeless tobacco: Never Used  Vaping Use  . Vaping Use: Never used  Substance Use Topics  . Alcohol use: No  . Drug use: No    Review of Systems  Constitutional: No fever/chills Eyes: No visual changes. ENT: No sore throat. Cardiovascular: Denies chest pain. Respiratory: Denies shortness of breath. Gastrointestinal: No abdominal pain.  No nausea, no vomiting.  No diarrhea.  No constipation. Genitourinary: Negative for dysuria. Musculoskeletal: Negative for back pain. Skin: Negative for rash. Neurological: Negative for headaches, focal weakness  ____________________________________________   PHYSICAL EXAM:  VITAL SIGNS: ED Triage Vitals  Enc Vitals Group     BP 04/16/20 1459 120/68     Pulse Rate 04/16/20 1459 81     Resp 04/16/20 1459 18      Temp 04/16/20 1459 98.4 F (36.9 C)     Temp Source 04/16/20 1459 Oral     SpO2 04/16/20 1459 96 %     Weight 04/16/20 1500 115 lb (52.2 kg)     Height 04/16/20 1500 5' (1.524 m)     Head Circumference --      Peak Flow --      Pain Score 04/16/20 1500 10     Pain Loc --      Pain Edu? --      Excl. in [place]? --     Constitutional: Alert and oriented.  Complaining of a lot of pain in the right leg. Eyes: Conjunctivae are normal.  Head: Atraumatic. Nose: No congestion/rhinnorhea. Mouth/Throat: Mucous membranes are moist.  Neck: No stridor. Cardiovascular: Normal rate, regular rhythm. Grossly normal heart sounds.  Good peripheral circulation. Respiratory: Normal respiratory effort.  No retractions. Lungs CTAB. Gastrointestinal: Soft and nontender.  No distention. No abdominal bruits. No CVA tenderness. Musculoskeletal: Both legs are diffusely tender to even light touch.  There are some edema bilaterally.  Patient reports the right leg especially the femur is much worse than the left leg. Neurologic:  Normal speech and language. No gross focal neurologic deficits are appreciated. Skin:  Skin is warm, dry and intact.    ____________________________________________   LABS (all labs ordered are listed, but only abnormal results are displayed)  Labs Reviewed  COMPREHENSIVE METABOLIC PANEL - Abnormal; Notable for the following components:      Result Value   Potassium 3.4 (*)    All other components within normal limits  CBC WITH DIFFERENTIAL/PLATELET - Abnormal; Notable for the following components:   Hemoglobin 10.7 (*)    HCT 32.3 (*)    MCV 74.8 (*)    MCH 24.8 (*)    RDW 15.8 (*)    All other components within normal limits  URINALYSIS, COMPLETE (UACMP) WITH MICROSCOPIC - Abnormal; Notable for the following components:   Color, Urine YELLOW (*)    APPearance HAZY (*)    Leukocytes,Ua SMALL (*)    Bacteria, UA RARE (*)    All other components within normal limits    SEDIMENTATION RATE   ____________________________________________  EKG   ____________________________________________  RADIOLOGY  ED MD interpretation:    Official radiology report(s): DG Lumbar Spine Complete  Result Date: 04/16/2020 CLINICAL DATA:  Severe right leg pain. EXAM: LUMBAR SPINE - COMPLETE 4+ VIEW COMPARISON:  Lumbar radiograph 04/03/2020 FINDINGS: Stable alignment with unchanged 7 mm anterolisthesis of L3 on L4. 3 mm anterolisthesis of L4 on L5. L3 through S1 posterior rod and intrapedicular screw fusion. Hardware is unchanged. Bones are under mineralized. Vertebral body heights are preserved. No evidence of acute fracture. The sacroiliac joints are congruent. IMPRESSION: 1. No acute abnormality. 2. Stable postsurgical change in the lumbar spine from L3 through S1. Stable anterolisthesis of L3 on L4 and L4 on L5. 3. Osteopenia/osteoporosis. Electronically Signed   By: Keith Rake M.D.   On: 04/16/2020 18:51   DG Pelvis 1-2 Views  Result Date: 04/16/2020 CLINICAL DATA:  86 year old female with pain in the right lower extremity. EXAM: RIGHT TIBIA AND FIBULA - 2 VIEW; RIGHT FEMUR 2 VIEWS; PELVIS - 1-2 VIEW COMPARISON:  Abdominal radiograph dated 11/11/2018. FINDINGS: There is no acute fracture or dislocation. There is advanced osteopenia. Mild to moderate arthritic changes of the right hip and right knee. Lower lumbar fusion hardware. The soft tissues are unremarkable. IMPRESSION: No acute fracture or dislocation. Electronically Signed   By: Anner Crete M.D.   On: 04/16/2020 18:53   DG Tibia/Fibula Right  Result Date: 04/16/2020 CLINICAL DATA:  86 year old female with pain in the right lower extremity. EXAM: RIGHT TIBIA AND FIBULA - 2 VIEW; RIGHT FEMUR 2 VIEWS; PELVIS - 1-2 VIEW COMPARISON:  Abdominal radiograph dated 11/11/2018. FINDINGS: There is no acute fracture or dislocation. There is advanced osteopenia. Mild to moderate arthritic changes of the right hip and right  knee. Lower lumbar fusion hardware. The soft tissues are unremarkable. IMPRESSION: No acute fracture or dislocation. Electronically Signed   By: Anner Crete M.D.   On: 04/16/2020 18:53   DG Femur Min 2 Views Right  Result Date: 04/16/2020 CLINICAL DATA:  86 year old female with pain in the right lower extremity. EXAM: RIGHT TIBIA AND FIBULA - 2 VIEW; RIGHT FEMUR 2 VIEWS; PELVIS - 1-2 VIEW COMPARISON:  Abdominal radiograph dated 11/11/2018. FINDINGS: There is no acute  fracture or dislocation. There is advanced osteopenia. Mild to moderate arthritic changes of the right hip and right knee. Lower lumbar fusion hardware. The soft tissues are unremarkable. IMPRESSION: No acute fracture or dislocation. Electronically Signed   By: Anner Crete M.D.   On: 04/16/2020 18:53    ____________________________________________   PROCEDURES  Procedure(s) performed (including Critical Care):  Procedures   ____________________________________________   INITIAL IMPRESSION / ASSESSMENT AND PLAN / ED COURSE  Patient also reports she has to pee but could not produce very much urine at all when she went to the bathroom.    ----------------------------------------- 6:22 PM on 04/16/2020 -----------------------------------------  Patient refuses IV and IV pain meds.  Therefore we will give her Vicodin which she usually takes at home.   ----------------------------------------- 11:43 PM on 04/16/2020 -----------------------------------------  Patient went to MRI but could not tolerate it.  She wants to go home now.  She does not want any more tests or anything else.  She has been walking with a walker at home although she cannot walk without a walker.  While I want her to stay I cannot convince her to stay.  She is perfectly capable of making this decision and she is more than competent.  I will let her go home.  Her daughter will call ambulance and bring her back if there is any other problems or she  gets worse or is not a lot better by tomorrow.     ____________________________________________   FINAL CLINICAL IMPRESSION(S) / ED DIAGNOSES  Final diagnoses:  Hip pain  Urinary tract infection with hematuria, site unspecified     ED Discharge Orders    None       Note:  This document was prepared using Dragon voice recognition software and may include unintentional dictation errors.    Nena Polio, MD 04/16/20 [phone]

## 2020-04-16 NOTE — ED Notes (Signed)
Patient c/o that one tablet of Vicodin is not enough. MD aware.

## 2020-04-16 NOTE — ED Triage Notes (Signed)
First nurse Note:  Arrives via [facility] with c/o right knee and right thigh pain.  [place] [address] one month ago.  Seen by neurology, no injury found.  Walks at home with a walker at home, daughter reports decreased mobility.

## 2020-04-16 NOTE — Discharge Instructions (Signed)
I wish you could stay and get a CAT scan or MRI of your hip.  If you are not better by tomorrow I would consider coming back.  If we can put an IV in you we can give you more pain medicine and make an MRI or CT of the hip more tolerable.  Also remember the antibiotics we gave you will take a little bit of time to work.  You may still be having urinary tract symptoms in the morning tomorrow.  Please also return for worse pain fever vomiting or feeling sicker.

## 2020-04-16 NOTE — ED Notes (Signed)
Patient taken to MRI

## 2020-04-16 NOTE — ED Notes (Signed)
Patient is anxious, repeatedly stating her stomach is "going to bust." Patient states she is going to die. [name] is at bedside.

## 2020-04-16 NOTE — ED Notes (Signed)
Patient taken to imaging. 

## 2020-04-16 NOTE — ED Notes (Signed)
Report given to Mac RN

## 2020-04-16 NOTE — ED Notes (Signed)
Patient taken by techs to shower and cleaned up after voiding.

## 2020-04-21 ENCOUNTER — Telehealth: Payer: Self-pay | Admitting: Family Medicine

## 2020-04-21 DIAGNOSIS — F411 Generalized anxiety disorder: Secondary | ICD-10-CM

## 2020-04-21 NOTE — Telephone Encounter (Signed)
Copied from [place] [phone]. Topic: Quick Communication - Rx Refill/Question >> Apr 21, 2020 10:09 AM Yvette Rack wrote: Medication: mirtazapine (REMERON) 30 MG tablet  Has the patient contacted their pharmacy? yes   Preferred Pharmacy (with phone number or street name): [place], [place]  Phone: [phone]  Fax: [phone]  Agent: Please be advised that RX refills may take up to 3 business days. We ask that you follow-up with your pharmacy.

## 2020-04-22 NOTE — Telephone Encounter (Signed)
This patient will need a visit. If it is difficult to get into office, we would need to do a virtual at least telephone visit if possible.  Please notify patient's daughter [name] to help schedule when she is available as well.  No new medicines today. Also clarify if she is still taking Mirtazapine 30mg  nightly or not? She should not have run out.  Also tell her I asked [name] to reach out and call again to see if anything is needed - because I am concerned the patient may need extra help.  -----------------------  1. Mirtazapine 30mg  nightly, was ordered for 90 day supply +1 refill, or 6 month supply.  There is no reason she should have run out of med unless she is taking more than prescribed.  2. For pain. She is followed by Pain Management and is on high dose Hydrocodone 10-325mg  every 4 hours as needed.  There is not much else I can do for her pain. She should check in with her pain doctor who prescribes this medicine to see what they can do to help.  3. For sleep she is also on Lorazepam 0.5mg  up to 3 times daily PRN. This should help with anxiety and sleep.  I would need to discuss in a virtual visit about her medicines, and talk with her daughter [name] as well to help figure out how I can help.  Also - I sent a message to [name] RN for CCM Case Management.  The patient/family ended CCM services in 11/2019 because they were no longer needed.  But now I do think it is worth getting them involved again. The patient has declined Palliative Care, but again I think this is important.  [name], DO [facility] 04/22/2020, 12:18 PM

## 2020-04-22 NOTE — Telephone Encounter (Signed)
Pt had a couple falls and is in pain and not able to sleep at night/ Pt would like to see if something can be called in to help with the pain or to help her sleep since her Rx for mirtazapine Is not due for refill until August/ please advise asap

## 2020-04-22 NOTE — Addendum Note (Signed)
Addended by: Olin Hauser on: 04/22/2020 12:20 PM   Modules accepted: Orders

## 2020-04-22 NOTE — Telephone Encounter (Signed)
Left message for [name] to call back, please relay Dr Marthann Schiller question and schedule her apt, Thank you.

## 2020-04-23 NOTE — Telephone Encounter (Signed)
Attempted to call pt's daughter, Judson Roch @ [phone].  Rec'd recording that "wireless customer is not available; please try to call again later."

## 2020-04-23 NOTE — Telephone Encounter (Signed)
[name] Judson Roch called and she says there was 1 refill left on Mirtazapine, so she doesn't need the refill. I advised she will need an appointment before the next refill as noted below by Dr. Raliegh Ip. She says she will call later on to schedule the appointment when it's closer to her needing the refill.

## 2020-04-24 ENCOUNTER — Ambulatory Visit: Payer: Self-pay

## 2020-04-24 NOTE — Chronic Care Management (AMB) (Signed)
  Care Management   Follow Up Note   04/24/2020 Name: [name] MRN: [id number] DOB: 01/26/1925  Referred by: Olin Hauser, DO Reason for referral : Berkley is a 87 y.o. year old female who is a primary care patient of Olin Hauser, DO. The care management team was consulted for assistance with care management and care coordination needs.    Review of patient status, including review of consultants reports, relevant laboratory and other test results, and collaboration with appropriate care team members and the patient's provider was performed as part of comprehensive patient evaluation and provision of chronic care management services.    LCSW completed CCM outreach attempt today but was unable to reach patient successfully. A HIPPA compliant voice message was left encouraging patient to return call once available. LCSW rescheduled CCM SW appointment as well.  A HIPPA compliant phone message was left for the patient providing contact information and requesting a return call.   Eula Fried, BSW, MSW, [email or web address] Phone: [phone]

## 2020-05-08 ENCOUNTER — Other Ambulatory Visit: Payer: Self-pay | Admitting: Nurse Practitioner

## 2020-05-08 DIAGNOSIS — R601 Generalized edema: Secondary | ICD-10-CM

## 2020-05-08 NOTE — Telephone Encounter (Signed)
Requested medication (s) are due for refill today:  Yes  Requested medication (s) are on the active medication list:  Yes  Future visit scheduled:  No  Last Refill: 02/04/20; #90; no refills  Notes to clinic:  pt. Was eval. 04/16/20 in ER for hip pain but her K+ was 3.4.  Last seen by Dr. Raliegh Ip. In Jan. 2021.  Please advise.   Requested Prescriptions  Pending Prescriptions Disp Refills   furosemide (LASIX) 20 MG tablet [Pharmacy Med Name: FUROSEMIDE 20 MG TABLET] 90 tablet 0    Sig: TAKE 1 TABLET DAILY AS NEEDED      Cardiovascular:  Diuretics - Loop Failed - 05/08/2020 11:06 AM      Failed - K in normal range and within 360 days    Potassium  Date Value Ref Range Status  04/16/2020 3.4 (L) 3.5 - 5.1 mmol/L Final  08/04/2014 3.6 3.5 - 5.1 mmol/L Final          Failed - Valid encounter within last 6 months    Recent Outpatient Visits           6 months ago Acute left otitis media   [facility] Olin Hauser, DO   9 months ago Acute cystitis without hematuria   [place], [place]   10 months ago [place], DO   10 months ago [facility] Merrilyn Puma, Jerrel Ivory, NP   1 year ago Acute cystitis without hematuria   [place], DO              Passed - Ca in normal range and within 360 days    Calcium  Date Value Ref Range Status  04/16/2020 9.6 8.9 - 10.3 mg/dL Final   Calcium, Total  Date Value Ref Range Status  08/04/2014 9.2 8.5 - 10.1 mg/dL Final          Passed - Na in normal range and within 360 days    Sodium  Date Value Ref Range Status  04/16/2020 138 135 - 145 mmol/L Final  08/04/2014 140 136 - 145 mmol/L Final          Passed - Cr in normal range and within 360 days    Creatinine  Date Value Ref Range Status  08/04/2014 0.98 0.60 - 1.30 mg/dL Final   Creatinine, Ser  Date  Value Ref Range Status  04/16/2020 0.61 0.44 - 1.00 mg/dL Final          Passed - Last BP in normal range    BP Readings from Last 1 Encounters:  04/17/20 106/70

## 2020-05-09 ENCOUNTER — Other Ambulatory Visit: Payer: Self-pay | Admitting: Family Medicine

## 2020-05-09 DIAGNOSIS — I10 Essential (primary) hypertension: Secondary | ICD-10-CM

## 2020-05-09 NOTE — Telephone Encounter (Signed)
Courtesy refill. Sent message to make appointment.

## 2020-05-27 DIAGNOSIS — H60332 Swimmer's ear, left ear: Secondary | ICD-10-CM | POA: Diagnosis not present

## 2020-05-27 DIAGNOSIS — D440 Neoplasm of uncertain behavior of thyroid gland: Secondary | ICD-10-CM | POA: Diagnosis not present

## 2020-05-30 ENCOUNTER — Other Ambulatory Visit: Payer: Self-pay | Admitting: Otolaryngology

## 2020-05-30 DIAGNOSIS — E041 Nontoxic single thyroid nodule: Secondary | ICD-10-CM

## 2020-06-04 ENCOUNTER — Other Ambulatory Visit: Admission: RE | Admit: 2020-06-04 | Payer: Medicare Other | Source: Ambulatory Visit

## 2020-06-05 ENCOUNTER — Other Ambulatory Visit: Payer: Self-pay | Admitting: Family Medicine

## 2020-06-05 DIAGNOSIS — F411 Generalized anxiety disorder: Secondary | ICD-10-CM

## 2020-06-05 DIAGNOSIS — I10 Essential (primary) hypertension: Secondary | ICD-10-CM

## 2020-06-05 NOTE — Telephone Encounter (Signed)
Requested Prescriptions  Pending Prescriptions Disp Refills   FLUoxetine (PROZAC) 20 MG capsule [Pharmacy Med Name: FLUOXETINE HCL 20 MG CAPSULE] 30 capsule 0    Sig: Take 1 capsule (20 mg total) by mouth daily.     Psychiatry:  Antidepressants - SSRI Failed - 06/05/2020 11:12 AM      Failed - Valid encounter within last 6 months    Recent Outpatient Visits          7 months ago Acute left otitis media   [facility] [place], [name], DO   10 months ago Acute cystitis without hematuria   [place], [place], [place]   11 months ago [place], DO   11 months ago [facility] Merrilyn Puma, Jerrel Ivory, NP   1 year ago Acute cystitis without hematuria   [place], DO      Future Appointments            In 3 weeks [place], [name], DO [facility]            carvedilol (COREG) 6.25 MG tablet [Pharmacy Med Name: CARVEDILOL 6.25 MG TABLET] 60 tablet 0    Sig: Take 1 tablet (6.25 mg total) by mouth 2 (two) times daily with a meal.     Cardiovascular:  Beta Blockers Failed - 06/05/2020 11:12 AM      Failed - Valid encounter within last 6 months    Recent Outpatient Visits          7 months ago Acute left otitis media   [place], DO   10 months ago Acute cystitis without hematuria   [place], [place], [place]   11 months ago [place], DO   11 months ago [facility] Merrilyn Puma, Jerrel Ivory, NP   1 year ago Acute cystitis without hematuria   [place], DO      Future Appointments            In 3 weeks [place], [name], DO [facility], [place] BP in  normal range    BP Readings from Last 1 Encounters:  04/17/20 106/70         Passed - Last Heart Rate in normal range    Pulse Readings from Last 1 Encounters:  04/17/20 78         Courtesy refill give. Appointment scheduled 06/27/20.

## 2020-06-06 ENCOUNTER — Ambulatory Visit
Admission: RE | Admit: 2020-06-06 | Discharge: 2020-06-06 | Disposition: A | Discharge status: Home / Self Care | Payer: Medicare Other | Source: Ambulatory Visit | Attending: Otolaryngology | Admitting: Otolaryngology

## 2020-06-06 ENCOUNTER — Other Ambulatory Visit: Payer: Self-pay

## 2020-06-06 DIAGNOSIS — E041 Nontoxic single thyroid nodule: Secondary | ICD-10-CM | POA: Diagnosis not present

## 2020-06-06 NOTE — Discharge Instructions (Signed)
Thyroid Needle Biopsy, Care After This sheet gives you information about how to care for yourself after your procedure. Your health care provider may also give you more specific instructions. If you have problems or questions, contact your health care provider. What can I expect after the procedure? After the procedure, it is common to have:  Soreness and tenderness that lasts for a few days.  Bruising where the needle was inserted (puncture site). Follow these instructions at home:   Take over-the-counter and prescription medicines only as told by your health care provider.  To help ease discomfort, keep your head raised (elevated) when you are lying down. When you move from lying down to sitting up, use both hands to support the back of your head and neck.  Check your puncture site every day for signs of infection. Check for: ? Redness, swelling, or pain. ? Fluid or blood. ? Warmth. ? Pus or a bad smell.  Return to your normal activities as told by your health care provider. Ask your health care provider what activities are safe for you.  Keep all follow-up visits as told by your health care provider. This is important. Contact a health care provider if:  You have redness, swelling, or pain around your puncture site.  You have fluid or blood coming from your puncture site.  Your puncture site feels warm to the touch.  You have pus or a bad smell coming from your puncture site.  You have a fever. Get help right away if:  You have severe bleeding from the puncture site.  You have difficulty swallowing.  You have swollen glands (lymph nodes) in your neck. Summary  It is common to have some bruising and soreness where the needle was inserted in your lower front neck area (puncture site).  Check your puncture site every day for signs of infection, such as redness, swelling, or pain.  Get help right away if you have severe bleeding from your puncture site. This  information is not intended to replace advice given to you by your health care provider. Make sure you discuss any questions you have with your health care provider. Document Revised: 09/09/2017 Document Reviewed: 07/11/2017 Elsevier Patient Education  2020 [employer]  

## 2020-06-06 NOTE — Procedures (Signed)
Successful FNA x 5 of left thyroid mass - 2 samples sent for Afirma testing. EBL: none  Please see imaging section of Epic for full dictation.

## 2020-06-09 LAB — CYTOLOGY - NON PAP

## 2020-06-17 DIAGNOSIS — C73 Malignant neoplasm of thyroid gland: Secondary | ICD-10-CM | POA: Diagnosis not present

## 2020-06-18 DIAGNOSIS — H9190 Unspecified hearing loss, unspecified ear: Secondary | ICD-10-CM | POA: Diagnosis not present

## 2020-06-18 DIAGNOSIS — I10 Essential (primary) hypertension: Secondary | ICD-10-CM | POA: Diagnosis not present

## 2020-06-18 DIAGNOSIS — Z885 Allergy status to narcotic agent status: Secondary | ICD-10-CM | POA: Diagnosis not present

## 2020-06-18 DIAGNOSIS — C73 Malignant neoplasm of thyroid gland: Secondary | ICD-10-CM | POA: Diagnosis not present

## 2020-06-18 DIAGNOSIS — Z880 Allergy status to penicillin: Secondary | ICD-10-CM | POA: Diagnosis not present

## 2020-06-18 DIAGNOSIS — Z7982 Long term (current) use of aspirin: Secondary | ICD-10-CM | POA: Diagnosis not present

## 2020-06-18 DIAGNOSIS — F329 Major depressive disorder, single episode, unspecified: Secondary | ICD-10-CM | POA: Diagnosis not present

## 2020-06-25 DIAGNOSIS — C73 Malignant neoplasm of thyroid gland: Secondary | ICD-10-CM | POA: Diagnosis not present

## 2020-06-25 DIAGNOSIS — I82C12 Acute embolism and thrombosis of left internal jugular vein: Secondary | ICD-10-CM | POA: Diagnosis not present

## 2020-06-25 DIAGNOSIS — E041 Nontoxic single thyroid nodule: Secondary | ICD-10-CM | POA: Diagnosis not present

## 2020-06-26 ENCOUNTER — Ambulatory Visit (INDEPENDENT_AMBULATORY_CARE_PROVIDER_SITE_OTHER): Payer: Medicare Other | Admitting: Licensed Clinical Social Worker

## 2020-06-26 ENCOUNTER — Other Ambulatory Visit: Payer: Self-pay | Admitting: Family Medicine

## 2020-06-26 DIAGNOSIS — M1710 Unilateral primary osteoarthritis, unspecified knee: Secondary | ICD-10-CM | POA: Diagnosis not present

## 2020-06-26 DIAGNOSIS — F411 Generalized anxiety disorder: Secondary | ICD-10-CM

## 2020-06-26 DIAGNOSIS — I10 Essential (primary) hypertension: Secondary | ICD-10-CM

## 2020-06-26 DIAGNOSIS — M179 Osteoarthritis of knee, unspecified: Secondary | ICD-10-CM

## 2020-06-26 DIAGNOSIS — G9340 Encephalopathy, unspecified: Secondary | ICD-10-CM

## 2020-06-26 DIAGNOSIS — R413 Other amnesia: Secondary | ICD-10-CM

## 2020-06-26 MED ORDER — MIRTAZAPINE 30 MG PO TABS: 30.0000 mg | ORAL_TABLET | Freq: Every day | ORAL | 1 refills | Status: DC

## 2020-06-26 NOTE — Chronic Care Management (AMB) (Signed)
Chronic Care Management    Clinical Social Work Follow Up Note  06/26/2020 Name: [name] MRN: [id number] DOB: 08/19/1925  [name] is a 86 y.o. year old female who is a primary care patient of Olin Hauser, DO. The CCM team was consulted for assistance with Level of Care Concerns and Mental Health Counseling and Resources.   Review of patient status, including review of consultants reports, other relevant assessments, and collaboration with appropriate care team members and the patient's provider was performed as part of comprehensive patient evaluation and provision of chronic care management services.    SDOH (Social Determinants of Health) assessments performed: Yes    Outpatient Encounter Medications as of 06/26/2020  Medication Sig Note  . amLODipine (NORVASC) 5 MG tablet Take 1 tablet (5 mg total) by mouth daily.   . carvedilol (COREG) 6.25 MG tablet Take 1 tablet (6.25 mg total) by mouth 2 (two) times daily with a meal.   . EPINEPHrine 0.3 mg/0.3 mL IJ SOAJ injection    . FLUoxetine (PROZAC) 20 MG capsule Take 1 capsule (20 mg total) by mouth daily.   . furosemide (LASIX) 20 MG tablet TAKE 1 TABLET DAILY AS NEEDED   . HYDROcodone-acetaminophen (NORCO) 10-325 MG tablet Take 1 tablet by mouth every 4 (four) hours as needed for moderate pain.  07/29/2018: Takes fairly regularly   . LORazepam (ATIVAN) 0.5 MG tablet Take 1 tablet (0.5 mg total) by mouth 3 (three) times daily as needed for anxiety.   . mirtazapine (REMERON) 30 MG tablet Take 1 tablet (30 mg total) by mouth at bedtime.   . tamsulosin (FLOMAX) 0.4 MG CAPS capsule Take 1 capsule (0.4 mg total) by mouth daily.    No facility-administered encounter medications on file as of 06/26/2020.     Goals Addressed    .  SW-"we need help with mom's care" (pt-stated)        Current Barriers:  [name] Knowledge Deficits related to community resources to assist with patient's care . Lacks caregiver support.   . Film/video editor.  . Cognitive Deficits . No Advanced Directives in place . Ongoing depression/anxiety  Licensed Clinical Social Worker Goal(s):  [name] Over the next 90 days, patient will work with LCSW to address needs related to Caregiver strain/mental health management   Interventions: ALL INTERVENTIONS DISCUSSED WITH DAUGHTER  . Evaluation of current treatment plan related to HTN, GAD, Chronic pain, UTIs and patient's adherence to plan as established by provider. . Discussed plans with patient for ongoing care management follow up and provided patient with direct contact information for care management team . CCM RNCM referral for follow up with patient about her health care/recent allergic reactions/unknown causes  . Talked with daughters [name] and [name] in the past at length concerning the patient's care. She expresses the great exhaustion both daughters are experiencing caring for their mother and trying to continue working. However, things have improved since hiring new caregiver.  . Family report patient's mood has "somewhat improved" since socialization gained with new caregiver as well.  . Discussed the benefit of Palliative care to have NP come to the patient's home to evaluate patient and assist with symptom management.  [name] UPDATE- [name] reports that patient was recently diagnoses with thyroid cancer and will need to have surgery. Surgery will be scheduled soon per family.  . Patient took her last remeron pil last night and [name] is asking for LCSW to complete care coordination and ask PCP for a refill  so that patient can have medication for tonight. PCP sent out medication refill on 06/26/20. . Family report stable transportation to upcoming PCP appointment tomorrow.  Patient Self Care Activities:  . Calls provider office for new concerns or questions inappropriately at times  . Unable to independently care for self . Does not attend all scheduled provider  appointments . Unable to perform IADLs independently . Vision impairment . Hearing impairment  Please see past updates related to this goal by clicking on the "Past Updates" button in the selected goal        Follow Up Plan: SW will follow up with patient by phone over the next 60 days  Eula Fried, [employer], MSW, [email or web address] Phone: [phone]

## 2020-06-27 ENCOUNTER — Telehealth (INDEPENDENT_AMBULATORY_CARE_PROVIDER_SITE_OTHER): Payer: Medicare Other | Admitting: Family Medicine

## 2020-06-27 ENCOUNTER — Encounter: Payer: Self-pay | Admitting: Family Medicine

## 2020-06-27 ENCOUNTER — Other Ambulatory Visit: Payer: Self-pay

## 2020-06-27 DIAGNOSIS — F411 Generalized anxiety disorder: Secondary | ICD-10-CM

## 2020-06-27 DIAGNOSIS — I10 Essential (primary) hypertension: Secondary | ICD-10-CM | POA: Diagnosis not present

## 2020-06-27 DIAGNOSIS — R601 Generalized edema: Secondary | ICD-10-CM | POA: Diagnosis not present

## 2020-06-27 DIAGNOSIS — C73 Malignant neoplasm of thyroid gland: Secondary | ICD-10-CM

## 2020-06-27 MED ORDER — AMLODIPINE BESYLATE 5 MG PO TABS: 5.0000 mg | ORAL_TABLET | Freq: Every day | ORAL | 1 refills | Status: DC

## 2020-06-27 MED ORDER — LORAZEPAM 0.5 MG PO TABS: 0.5000 mg | ORAL_TABLET | Freq: Four times a day (QID) | ORAL | 2 refills | Status: DC | PRN

## 2020-06-27 MED ORDER — CARVEDILOL 6.25 MG PO TABS: 6.2500 mg | ORAL_TABLET | Freq: Two times a day (BID) | ORAL | 1 refills | Status: DC

## 2020-06-27 MED ORDER — FLUOXETINE HCL 20 MG PO CAPS: 20.0000 mg | ORAL_CAPSULE | Freq: Every day | ORAL | 1 refills | Status: DC

## 2020-06-27 MED ORDER — FUROSEMIDE 20 MG PO TABS: 20.0000 mg | ORAL_TABLET | Freq: Every day | ORAL | 1 refills | Status: DC

## 2020-06-27 NOTE — Progress Notes (Signed)
Virtual Visit via Telephone The purpose of this virtual visit is to provide medical care while limiting exposure to the novel coronavirus (COVID19) for both patient and office staff.  Consent was obtained for phone visit:  Yes.   Answered questions that patient had about telehealth interaction:  Yes.   I discussed the limitations, risks, security and privacy concerns of performing an evaluation and management service by telephone. I also discussed with the patient that there may be a patient responsible charge related to this service. The patient expressed understanding and agreed to proceed.  Patient Location: Home Provider Location: [facility])  ---------------------------------------------------------------------- Chief Complaint  Patient presents with  . Anxiety    as per daughter they want Lorazepam X 4 day instead of X 3 daily and Mirtazapine--two tab instead one --also taking furosemide taking every day isntead as needed     S: Reviewed CMA documentation. I have called patient and gathered additional HPI as follows:  History provided primarily by daughter, [name]. Patient is on the phone as well.  Papillary Thyroid Carcinoma Followed now by [facility] Previously identified on imaging and referred to [facility], Dr Pryor Ochoa [place] FNA Biopsy to confirm. Now has seen [facility] last visit 06/18/20, review of confirmed biopsy 06/06/20 showed papillary thyroid carcinoma, she will be pursuing CT imaging, and discussions of treatment options if surgical thyroidectomy is available if limited to thyroid. - Additionally she admits having some burning from dye from last imaging test at [facility], she also says some blurred vision, they have not called [facility]  Generalized Anxiety Chronic problem, managed by prior PCP on Ativan 0.5mg  TID PRN. She has been stable on this medication over years. Recently with new diagnosis of thyroid cancer she has been more anxious and interested in taking a 4th  dose more often as it has helped, will run out sooner, asks about re order dose. - She takes mirtazapine 30mg  nightly, this was just refilled yesterday - They have been offered Palliative Care in past but have declined   Denies any fevers, chills, sweats, body ache, cough, shortness of breath, sinus pain or pressure, headache, abdominal pain, diarrhea  Past Medical History:  Diagnosis Date  . Allergy    environmental  . Anxiety   . Chronic back pain   . High cholesterol   . History of urinary retention   . Hypertension   . Migraines   . Osteoporosis   . Pneumonia   . Sinusitis    Social History   Tobacco Use  . Smoking status: Never Smoker  . Smokeless tobacco: Never Used  Vaping Use  . Vaping Use: Never used  Substance Use Topics  . Alcohol use: No  . Drug use: No    Current Outpatient Medications:  .  amLODipine (NORVASC) 5 MG tablet, Take 1 tablet (5 mg total) by mouth daily., Disp: 90 tablet, Rfl: 1 .  carvedilol (COREG) 6.25 MG tablet, Take 1 tablet (6.25 mg total) by mouth 2 (two) times daily with a meal., Disp: 180 tablet, Rfl: 1 .  EPINEPHrine 0.3 mg/0.3 mL IJ SOAJ injection, , Disp: , Rfl:  .  FLUoxetine (PROZAC) 20 MG capsule, Take 1 capsule (20 mg total) by mouth daily., Disp: 90 capsule, Rfl: 1 .  furosemide (LASIX) 20 MG tablet, Take 1 tablet (20 mg total) by mouth daily., Disp: 90 tablet, Rfl: 1 .  HYDROcodone-acetaminophen (NORCO) 10-325 MG tablet, Take 1 tablet by mouth every 4 (four) hours as needed for moderate  pain. six tablets a day every four hours --per pain management, Disp: , Rfl:  .  LORazepam (ATIVAN) 0.5 MG tablet, Take 1 tablet (0.5 mg total) by mouth 4 (four) times daily as needed for anxiety., Disp: 120 tablet, Rfl: 2 .  mirtazapine (REMERON) 30 MG tablet, Take 1 tablet (30 mg total) by mouth at bedtime., Disp: 90 tablet, Rfl: 1 .  tamsulosin (FLOMAX) 0.4 MG CAPS capsule, Take 1 capsule (0.4 mg total) by mouth daily., Disp: 30 capsule, Rfl:  11  Depression screen [facility] 2/9 06/27/2020 04/06/2019 01/19/2019  Decreased Interest 2 0 0  Down, Depressed, Hopeless 0 0 0  PHQ - 2 Score 2 0 0  Altered sleeping 0 0 0  Tired, decreased energy 2 0 1  Change in appetite 2 0 0  Feeling bad or failure about yourself  0 0 0  Trouble concentrating 0 0 0  Moving slowly or fidgety/restless 0 0 0  Suicidal thoughts 0 0 0  PHQ-9 Score 6 0 1  Difficult doing work/chores Not difficult at all Not difficult at all Not difficult at all  Some recent data might be hidden    GAD 7 : Generalized Anxiety Score 06/27/2020 06/22/2019 02/15/2019 01/19/2019  Nervous, Anxious, on Edge 0 (No Data) (No Data) 0  Control/stop worrying 1 - - 0  Worry too much - different things 1 - - 0  Trouble relaxing 1 - - 0  Restless 0 - - 0  Easily annoyed or irritable 0 - - 0  Afraid - awful might happen 2 - - 0  Total GAD 7 Score 5 - - 0  Anxiety Difficulty Not difficult at all - - Not difficult at all    -------------------------------------------------------------------------- O: No physical exam performed due to remote telephone encounter.  Lab results reviewed.  Recent Results (from the past 2160 hour(s))  Urinalysis, Complete w Microscopic     Status: Abnormal   Collection Time: 04/16/20  5:37 PM  Result Value Ref Range   Color, Urine YELLOW (A) YELLOW   APPearance HAZY (A) CLEAR   Specific Gravity, Urine 1.005 1.005 - 1.030   pH 7.0 5.0 - 8.0   Glucose, UA NEGATIVE NEGATIVE mg/dL   Hgb urine dipstick NEGATIVE NEGATIVE   Bilirubin Urine NEGATIVE NEGATIVE   Ketones, ur NEGATIVE NEGATIVE mg/dL   Protein, ur NEGATIVE NEGATIVE mg/dL   Nitrite NEGATIVE NEGATIVE   Leukocytes,Ua SMALL (A) NEGATIVE   RBC / HPF 0-5 0 - 5 RBC/hpf   WBC, UA 21-50 0 - 5 WBC/hpf   Bacteria, UA RARE (A) NONE SEEN   Squamous Epithelial / LPF NONE SEEN 0 - 5   WBC Clumps PRESENT    Amorphous Crystal PRESENT     Comment: Performed at [facility], [address]  [place],  [address]  Comprehensive metabolic panel     Status: Abnormal   Collection Time: 04/16/20  6:30 PM  Result Value Ref Range   Sodium 138 135 - 145 mmol/L   Potassium 3.4 (L) 3.5 - 5.1 mmol/L   Chloride 102 98 - 111 mmol/L   CO2 27 22 - 32 mmol/L   Glucose, Bld 96 70 - 99 mg/dL    Comment: Glucose reference range applies only to samples taken after fasting for at least 8 hours.   BUN 11 8 - 23 mg/dL   Creatinine, Ser 0.61 0.44 - 1.00 mg/dL   Calcium 9.6 8.9 - 10.3 mg/dL   Total Protein 6.9 6.5 -  8.1 g/dL   Albumin 4.0 3.5 - 5.0 g/dL   AST 22 15 - 41 U/L   ALT 12 0 - 44 U/L   Alkaline Phosphatase 74 38 - 126 U/L   Total Bilirubin 0.8 0.3 - 1.2 mg/dL   GFR calc non Af Amer >60 >60 mL/min   GFR calc Af Amer >60 >60 mL/min   Anion gap 9 5 - 15    Comment: Performed at [facility], [address] [place], [place] [address]  CBC with Differential     Status: Abnormal   Collection Time: 04/16/20  6:30 PM  Result Value Ref Range   WBC 6.8 4.0 - 10.5 K/uL   RBC 4.32 3.87 - 5.11 MIL/uL   Hemoglobin 10.7 (L) 12.0 - 15.0 g/dL   HCT 32.3 (L) 36 - 46 %   MCV 74.8 (L) 80.0 - 100.0 fL   MCH 24.8 (L) 26.0 - 34.0 pg   MCHC 33.1 30.0 - 36.0 g/dL   RDW 15.8 (H) 11.5 - 15.5 %   Platelets 192 150 - 400 K/uL   nRBC 0.0 0.0 - 0.2 %   Neutrophils Relative % 44 %   Neutro Abs 3.0 1.7 - 7.7 K/uL   Lymphocytes Relative 38 %   Lymphs Abs 2.6 0.7 - 4.0 K/uL   Monocytes Relative 12 %   Monocytes Absolute 0.8 0 - 1 K/uL   Eosinophils Relative 5 %   Eosinophils Absolute 0.3 0 - 0 K/uL   Basophils Relative 1 %   Basophils Absolute 0.1 0 - 0 K/uL   Immature Granulocytes 0 %   Abs Immature Granulocytes 0.01 0.00 - 0.07 K/uL    Comment: Performed at [facility], [address] [place], [place] [address]  Sedimentation rate     Status: None   Collection Time: 04/16/20  6:30 PM  Result Value Ref Range   Sed Rate 9 0 - 30 mm/hr    Comment: Performed at [facility], [address], [place], [place] [address]  Cytology - Non PAP;     Status: None   Collection Time: 06/06/20 11:01 AM  Result Value Ref Range   CYTOLOGY - NON GYN      CYTOLOGY - NON PAP CASE: [id number] PATIENT: [facility] Non-Gynecological Cytology Report     Specimen Submitted: A. Thyroid, left  Clinical History: 8.2 cm thyroid mass seen on CT. Previous bx: No    DIAGNOSIS: A. THYROID GLAND, LEFT LOBE; ULTRASOUND-GUIDED FINE-NEEDLE ASPIRATION: - MALIGNANT (BETHESDA CATEGORY VI). - COMPATIBLE WITH PAPILLARY THYROID CARCINOMA.  Comment: Smears display an adequately cellular sampling, comprised of abnormal clusters of epithelial cells with significant nuclear crowding and overlap. Papillary configurations are noted. Nuclear grooves and rare inclusions are present. The findings are compatible with papillary thyroid carcinoma.  Slides reviewed: One ThinPrep, 3 Diff-Quik, 3 Pap stain  GROSS DESCRIPTION: A. Site: Left thyroid Procedure: Ultrasound-guided FNA Cytotechnologist(s): Rivka Barbara  Specimen material collected and submitted for: 3 diff Quik stained slides 3 Pap stained slides Material for ThyroSeq if  needed, labeled with the patients name, date of birth, specimen site, and collection date ThinPrep: 1  Specimen description: Fixative: CytoLyt Volume: 30 mL Color: Red Transparency: Clear Tissue fragments present: No   Final Diagnosis performed by Allena Napoleon, MD.   Electronically signed 06/09/2020 4:48:13PM The electronic signature indicates that the named Attending Pathologist has evaluated the specimen Technical component performed at [facility], [address], [place], [place] [address] Lab: [phone] Dir:  Rush Farmer, MD, MMM  Professional component performed at [facility], [facility], [place], [place], [place] [address] Lab: [phone] Dir: Dellia Nims. Rubinas, MD      -------------------------------------------------------------------------- A&P:  Problem List Items Addressed This Visit    Papillary thyroid carcinoma (HCC)   Relevant Medications   LORazepam (ATIVAN) 0.5 MG tablet   carvedilol (COREG) 6.25 MG tablet   Generalized anxiety disorder - Primary   Relevant Medications   LORazepam (ATIVAN) 0.5 MG tablet   FLUoxetine (PROZAC) 20 MG capsule   Essential (primary) hypertension   Relevant Medications   carvedilol (COREG) 6.25 MG tablet   amLODipine (NORVASC) 5 MG tablet   furosemide (LASIX) 20 MG tablet    Other Visit Diagnoses    Generalized edema       Relevant Medications   furosemide (LASIX) 20 MG tablet     #Papillary Thyroid Carcinoma Followed by [facility]NT now Neck ultrasound diagnosed in 2021. Identified on biopsy FNA 06/06/20. Awaiting further imaging staging and determine treatment options, if surgical thyroidectomy is available.  # Generalized Anxiety Chronic problem Worse with recent thyroid cancer diagnosis Discussion on safety of BDZ and risks Expressed my concern for her using this med long term as well as opiate therapy, however given age and her comorbidity she has demonstrated tolerating med well, she has good support system with daughter at home, there have been no major adverse events on this med, I am okay to increase her dose- as she is already tolerating 4 times a day Lorazepam 0.5mg  now, but needs new order. - I strongly emphasized, next step is Palliative Care consultation and determine goals of care and symptom management as well with home visit care PRN in future. - Especially with regards to controlled and higher risk medications, it is beneficial to have consultation by Palliative to help guide me to help control her symptoms more safely. Daughter will discuss with family and notify me when ready for referral    Meds ordered this encounter  Medications  . LORazepam (ATIVAN) 0.5 MG tablet    Sig: Take 1  tablet (0.5 mg total) by mouth 4 (four) times daily as needed for anxiety.    Dispense:  120 tablet    Refill:  2    Increase frequency.  [name] FLUoxetine (PROZAC) 20 MG capsule    Sig: Take 1 capsule (20 mg total) by mouth daily.    Dispense:  90 capsule    Refill:  1    Add refills  . carvedilol (COREG) 6.25 MG tablet    Sig: Take 1 tablet (6.25 mg total) by mouth 2 (two) times daily with a meal.    Dispense:  180 tablet    Refill:  1    Add refills  . amLODipine (NORVASC) 5 MG tablet    Sig: Take 1 tablet (5 mg total) by mouth daily.    Dispense:  90 tablet    Refill:  1    Add refills  . furosemide (LASIX) 20 MG tablet    Sig: Take 1 tablet (20 mg total) by mouth daily.    Dispense:  90 tablet    Refill:  1    Add refills    Follow-up: 3 month as needed  Patient verbalizes understanding with the above medical recommendations including the limitation of remote medical advice.  Specific follow-up and call-back criteria were given for patient to follow-up or seek medical care more urgently if needed.   - Time  spent in direct consultation with patient on phone: 15 minutes   [name], [facility] 06/27/2020, 4:49 PM

## 2020-06-30 DIAGNOSIS — M25551 Pain in right hip: Secondary | ICD-10-CM | POA: Diagnosis not present

## 2020-06-30 DIAGNOSIS — M545 Low back pain: Secondary | ICD-10-CM | POA: Diagnosis not present

## 2020-06-30 DIAGNOSIS — M5416 Radiculopathy, lumbar region: Secondary | ICD-10-CM | POA: Diagnosis not present

## 2020-07-17 DIAGNOSIS — G8929 Other chronic pain: Secondary | ICD-10-CM | POA: Diagnosis not present

## 2020-07-17 DIAGNOSIS — F32A Depression, unspecified: Secondary | ICD-10-CM | POA: Diagnosis not present

## 2020-07-17 DIAGNOSIS — C73 Malignant neoplasm of thyroid gland: Secondary | ICD-10-CM | POA: Diagnosis not present

## 2020-07-17 DIAGNOSIS — I10 Essential (primary) hypertension: Secondary | ICD-10-CM | POA: Diagnosis not present

## 2020-07-17 DIAGNOSIS — M47816 Spondylosis without myelopathy or radiculopathy, lumbar region: Secondary | ICD-10-CM | POA: Diagnosis not present

## 2020-07-17 DIAGNOSIS — M545 Low back pain, unspecified: Secondary | ICD-10-CM | POA: Diagnosis not present

## 2020-07-17 DIAGNOSIS — R221 Localized swelling, mass and lump, neck: Secondary | ICD-10-CM | POA: Diagnosis not present

## 2020-07-17 DIAGNOSIS — H3530 Unspecified macular degeneration: Secondary | ICD-10-CM | POA: Diagnosis not present

## 2020-07-17 DIAGNOSIS — H9190 Unspecified hearing loss, unspecified ear: Secondary | ICD-10-CM | POA: Diagnosis not present

## 2020-07-24 ENCOUNTER — Telehealth: Payer: Self-pay

## 2020-07-24 ENCOUNTER — Ambulatory Visit (INDEPENDENT_AMBULATORY_CARE_PROVIDER_SITE_OTHER): Payer: Medicare Other | Admitting: Cardiology

## 2020-07-24 ENCOUNTER — Encounter: Payer: Self-pay | Admitting: Cardiology

## 2020-07-24 ENCOUNTER — Other Ambulatory Visit: Payer: Self-pay

## 2020-07-24 VITALS — BP 126/72 | HR 72 | Ht 60.0 in | Wt 116.8 lb

## 2020-07-24 DIAGNOSIS — Z01810 Encounter for preprocedural cardiovascular examination: Secondary | ICD-10-CM

## 2020-07-24 DIAGNOSIS — I10 Essential (primary) hypertension: Secondary | ICD-10-CM

## 2020-07-24 NOTE — Telephone Encounter (Signed)
[name] called stating that she believes patient has a UTI. She is complaining of dysuria and pain with urination. Patient was offered an apt today unable to come. Apt made for tomorrow patient unsure if she will feel up to coming in. [name] states she is having surgery next week on her thyroid. [name] will stop by the office to pick up a cup for visit tomorrow. Patient has trouble providing a sample in office

## 2020-07-24 NOTE — Progress Notes (Signed)
Cardiology Office Note:    Date:  07/24/2020   ID:  [name], DOB 10/28/1924, MRN [id number]  PCP:  Olin Hauser, DO  [place] Cardiologist:  Kate Sable, MD  [place] Electrophysiologist:  None   Referring MD: [name] *   Chief Complaint  Patient presents with  . New Patient (Initial Visit)    Establish care with provider for cardiac clearance to have surgical procedure. Sugery is scheduled for 07/29/2020 and will need an echo prior. Medications verbally reviewed with patient and daughter.    History of Present Illness:    [name] is a 86 y.o. female with a hx of hypertension, anxiety, papillary thyroid carcinoma, hard of hearing who presents for cardiovascular risk stratification prior to surgical procedure.  Patient has a diagnosis of papillary thyroid carcinoma.  Surgical thyroidectomy is being planned.  Patient has a prior echocardiogram from 08/2018 showing normal systolic function, EF 55 to 60%, impaired relaxation, no significant valvular pathology noted.  Patient denies any history of heart disease.  Denies palpitations, edema, syncope, chest pain or shortness of breath.  She takes her blood pressure medications as prescribed.  Past Medical History:  Diagnosis Date  . Allergy    environmental  . Anxiety   . Chronic back pain   . High cholesterol   . History of urinary retention   . Hypertension   . Migraines   . Osteoporosis   . Pneumonia   . Sinusitis     Past Surgical History:  Procedure Laterality Date  . ABDOMINAL HYSTERECTOMY    . APPENDECTOMY    . BACK SURGERY    . BACK SURGERY  2008   2009  . BREAST BIOPSY Right 2002   neg  . HEMORRHOID SURGERY    . MOUTH SURGERY  2019  . TONSILLECTOMY      Current Medications: Current Meds  Medication Sig  . amLODipine (NORVASC) 5 MG tablet Take 1 tablet (5 mg total) by mouth daily.  . carvedilol (COREG) 6.25 MG tablet Take 1 tablet (6.25 mg total)  by mouth 2 (two) times daily with a meal.  . EPINEPHrine 0.3 mg/0.3 mL IJ SOAJ injection   . FLUoxetine (PROZAC) 20 MG capsule Take 1 capsule (20 mg total) by mouth daily.  . furosemide (LASIX) 20 MG tablet Take 1 tablet (20 mg total) by mouth daily.  [name] HYDROcodone-acetaminophen (NORCO) 10-325 MG tablet Take 1 tablet by mouth every 4 (four) hours as needed for moderate pain. six tablets a day every four hours --per pain management  . LORazepam (ATIVAN) 0.5 MG tablet Take 1 tablet (0.5 mg total) by mouth 4 (four) times daily as needed for anxiety.  . mirtazapine (REMERON) 30 MG tablet Take 1 tablet (30 mg total) by mouth at bedtime.  . tamsulosin (FLOMAX) 0.4 MG CAPS capsule Take 1 capsule (0.4 mg total) by mouth daily.     Allergies:   Amoxicillin, Ceftin [cefuroxime], Cephalosporins, Cortisone, Fluticasone, Levofloxacin, Meperidine, Morphine and related, Morphine sulfate, Nisoldipine, Penicillin g potassium in d5w, Prednisone, Sulfa antibiotics, Cephalexin, and Ciprofloxacin   Social History   Socioeconomic History  . Marital status: Widowed    Spouse name: Not on file  . Number of children: Not on file  . Years of education: Not on file  . Highest education level: High school graduate  Occupational History  . Occupation: retired  Tobacco Use  . Smoking status: Never Smoker  . Smokeless tobacco: Never Used  Vaping Use  .  Vaping Use: Never used  Substance and Sexual Activity  . Alcohol use: No  . Drug use: No  . Sexual activity: Not Currently  Other Topics Concern  . Not on file  Social History Narrative   Lives at home with daughter [name], ambulates with a walker   Social Determinants of Health   Financial Resource Strain:   . Difficulty of Paying Living Expenses: Not on file  Food Insecurity:   . Worried About Charity fundraiser in the Last Year: Not on file  . Ran Out of Food in the Last Year: Not on file  Transportation Needs:   . Lack of Transportation (Medical): Not  on file  . Lack of Transportation (Non-Medical): Not on file  Physical Activity:   . Days of Exercise per Week: Not on file  . Minutes of Exercise per Session: Not on file  Stress:   . Feeling of Stress : Not on file  Social Connections:   . Frequency of Communication with Friends and Family: Not on file  . Frequency of Social Gatherings with Friends and Family: Not on file  . Attends Religious Services: Not on file  . Active Member of Clubs or Organizations: Not on file  . Attends Archivist Meetings: Not on file  . Marital Status: Not on file     Family History: The patient's family history includes Bladder Cancer in her brother; Breast cancer (age of onset: 48) in her maternal grandmother; Breast cancer (age of onset: 61) in her sister; Breast cancer (age of onset: 37) in her sister. There is no history of Hypertension or Kidney cancer.  ROS:   Please see the history of present illness.     All other systems reviewed and are negative.  EKGs/Labs/Other Studies Reviewed:    The following studies were reviewed today:   EKG:  EKG is  ordered today.  The ekg ordered today demonstrates normal sinus rhythm, right bundle branch block.  Recent Labs: 09/24/2019: B Natriuretic Peptide 213.0 04/16/2020: ALT 12; BUN 11; Creatinine, Ser 0.61; Hemoglobin 10.7; Platelets 192; Potassium 3.4; Sodium 138  Recent Lipid Panel No results found for: CHOL, TRIG, HDL, CHOLHDL, VLDL, LDLCALC, LDLDIRECT   Risk Assessment/Calculations:      Physical Exam:    VS:  BP 126/72 (BP Location: Left Arm, Patient Position: Sitting, Cuff Size: Normal)   Pulse 72   Ht 5' (1.524 m)   Wt 116 lb 12.8 oz (53 kg)   SpO2 94%   BMI 22.81 kg/m     Wt Readings from Last 3 Encounters:  07/24/20 116 lb 12.8 oz (53 kg)  04/16/20 115 lb (52.2 kg)  09/29/19 125 lb (56.7 kg)     GEN:  Well nourished, elderly female HEENT: Normal NECK: No JVD; No carotid bruits LYMPHATICS: No  lymphadenopathy CARDIAC: RRR, no murmurs, rubs, gallops RESPIRATORY:  Clear to auscultation without rales, wheezing or rhonchi  ABDOMEN: Soft, non-tender, non-distended MUSCULOSKELETAL:  No edema; No deformity  SKIN: Warm and dry NEUROLOGIC:  Alert and oriented x 3 PSYCHIATRIC:  Normal affect   ASSESSMENT:    1. Pre-operative cardiovascular examination   2. Primary hypertension    PLAN:    In order of problems listed above:  1. Patient being seen for cardiac risk stratification prior to thyroidectomy due to papillary thyroid cancer.  This is a low risk procedure from a cardiac perspective.  Echocardiogram on 08/2018 showed normal systolic function, no significant valvular pathology.  Patient denies any  cardiac symptoms or history of heart disease.  Ms. Cieslewicz perioperative risk of a major cardiac event is 0.4% according to the Revised Cardiac Risk Index (RCRI).  Therefore, she is at low risk for perioperative complications.    Recommendations: According to ACC/AHA guidelines, no further cardiovascular testing needed.  The patient may proceed to surgery at acceptable risk.     2.  History of hypertension, blood pressure controlled.  Continue Coreg and amlodipine as currently prescribed.  Follow-up as needed.  Total encounter time 60 minutes  Greater than 50% was spent in counseling and coordination of care with the patient    Medication Adjustments/Labs and Tests Ordered: Current medicines are reviewed at length with the patient today.  Concerns regarding medicines are outlined above.  Orders Placed This Encounter  Procedures  . EKG 12-Lead   No orders of the defined types were placed in this encounter.   Patient Instructions  Medication Instructions:  No changes  *If you need a refill on your cardiac medications before your next appointment, please call your pharmacy*   Lab Work: None  If you have labs (blood work) drawn today and your tests are completely  normal, you will receive your results only by: [name] MyChart Message (if you have MyChart) OR . A paper copy in the mail If you have any lab test that is abnormal or we need to change your treatment, we will call you to review the results.   Testing/Procedures: None   Follow-Up: At [facility], you and your health needs are our priority.  As part of our continuing mission to provide you with exceptional heart care, we have created designated Provider Care Teams.  These Care Teams include your primary Cardiologist (physician) and Advanced Practice Providers (APPs -  Physician Assistants and Nurse Practitioners) who all work together to provide you with the care you need, when you need it.  We recommend signing up for the patient portal called "MyChart".  Sign up information is provided on this After Visit Summary.  MyChart is used to connect with patients for Virtual Visits (Telemedicine).  Patients are able to view lab/test results, encounter notes, upcoming appointments, etc.  Non-urgent messages can be sent to your provider as well.   To learn more about what you can do with MyChart, go to [email or web address].    Your next appointment:   Follow up as needed.     Signed, Kate Sable, MD  07/24/2020 1:00 PM    [place]

## 2020-07-24 NOTE — Patient Instructions (Signed)

## 2020-07-25 ENCOUNTER — Ambulatory Visit: Payer: Self-pay | Admitting: Physician Assistant

## 2020-07-29 DIAGNOSIS — F419 Anxiety disorder, unspecified: Secondary | ICD-10-CM | POA: Diagnosis present

## 2020-07-29 DIAGNOSIS — Z880 Allergy status to penicillin: Secondary | ICD-10-CM | POA: Diagnosis not present

## 2020-07-29 DIAGNOSIS — I10 Essential (primary) hypertension: Secondary | ICD-10-CM | POA: Diagnosis present

## 2020-07-29 DIAGNOSIS — I951 Orthostatic hypotension: Secondary | ICD-10-CM | POA: Diagnosis not present

## 2020-07-29 DIAGNOSIS — Z8781 Personal history of (healed) traumatic fracture: Secondary | ICD-10-CM | POA: Diagnosis not present

## 2020-07-29 DIAGNOSIS — R000 Tachycardia, unspecified: Secondary | ICD-10-CM | POA: Diagnosis not present

## 2020-07-29 DIAGNOSIS — R221 Localized swelling, mass and lump, neck: Secondary | ICD-10-CM | POA: Diagnosis not present

## 2020-07-29 DIAGNOSIS — Z8679 Personal history of other diseases of the circulatory system: Secondary | ICD-10-CM | POA: Diagnosis not present

## 2020-07-29 DIAGNOSIS — Z9641 Presence of insulin pump (external) (internal): Secondary | ICD-10-CM | POA: Diagnosis present

## 2020-07-29 DIAGNOSIS — E892 Postprocedural hypoparathyroidism: Secondary | ICD-10-CM | POA: Diagnosis not present

## 2020-07-29 DIAGNOSIS — H9190 Unspecified hearing loss, unspecified ear: Secondary | ICD-10-CM | POA: Diagnosis present

## 2020-07-29 DIAGNOSIS — Z95810 Presence of automatic (implantable) cardiac defibrillator: Secondary | ICD-10-CM | POA: Diagnosis not present

## 2020-07-29 DIAGNOSIS — R911 Solitary pulmonary nodule: Secondary | ICD-10-CM | POA: Diagnosis not present

## 2020-07-29 DIAGNOSIS — E041 Nontoxic single thyroid nodule: Secondary | ICD-10-CM | POA: Diagnosis present

## 2020-07-29 DIAGNOSIS — C73 Malignant neoplasm of thyroid gland: Secondary | ICD-10-CM | POA: Diagnosis present

## 2020-07-29 DIAGNOSIS — Z20822 Contact with and (suspected) exposure to covid-19: Secondary | ICD-10-CM | POA: Diagnosis present

## 2020-07-29 DIAGNOSIS — F32A Depression, unspecified: Secondary | ICD-10-CM | POA: Diagnosis present

## 2020-07-29 DIAGNOSIS — H3530 Unspecified macular degeneration: Secondary | ICD-10-CM | POA: Diagnosis present

## 2020-08-04 ENCOUNTER — Telehealth: Payer: Self-pay

## 2020-08-04 NOTE — Telephone Encounter (Signed)
Copied from [place] [id number]. Topic: Quick Communication - Home Health Verbal Orders >> Aug 04, 2020 11:10 AM Yvette Rack wrote: Caller/Agency: [place] with [facility] Number: [phone] Requesting OT/PT/Skilled Nursing/Social Work/Speech Therapy: skilled nursing [place] requests approval for removal [facility] at [facility] - Fax# [phone]

## 2020-08-05 NOTE — Telephone Encounter (Signed)
Verbal given for getting assessed by RN first and can be taken off if needed.

## 2020-08-06 DIAGNOSIS — I10 Essential (primary) hypertension: Secondary | ICD-10-CM | POA: Diagnosis not present

## 2020-08-06 DIAGNOSIS — M47896 Other spondylosis, lumbar region: Secondary | ICD-10-CM | POA: Diagnosis not present

## 2020-08-06 DIAGNOSIS — C73 Malignant neoplasm of thyroid gland: Secondary | ICD-10-CM | POA: Diagnosis not present

## 2020-08-06 DIAGNOSIS — E892 Postprocedural hypoparathyroidism: Secondary | ICD-10-CM | POA: Diagnosis not present

## 2020-08-06 DIAGNOSIS — E890 Postprocedural hypothyroidism: Secondary | ICD-10-CM | POA: Diagnosis not present

## 2020-08-06 DIAGNOSIS — Z483 Aftercare following surgery for neoplasm: Secondary | ICD-10-CM | POA: Diagnosis not present

## 2020-08-06 DIAGNOSIS — G8929 Other chronic pain: Secondary | ICD-10-CM | POA: Diagnosis not present

## 2020-08-06 DIAGNOSIS — M8448XD Pathological fracture, other site, subsequent encounter for fracture with routine healing: Secondary | ICD-10-CM | POA: Diagnosis not present

## 2020-08-06 DIAGNOSIS — M259 Joint disorder, unspecified: Secondary | ICD-10-CM | POA: Diagnosis not present

## 2020-08-06 DIAGNOSIS — F419 Anxiety disorder, unspecified: Secondary | ICD-10-CM | POA: Diagnosis not present

## 2020-08-07 ENCOUNTER — Telehealth: Payer: Self-pay | Admitting: Family Medicine

## 2020-08-07 NOTE — Telephone Encounter (Signed)
Yes, once urinalysis and urine culture are resulted and received by our office we can advise further. Patient may schedule virtual visit if need next week to discuss results if still has symptoms or concerns.  Note - she has extensive allergy list including - Penicillin, Amoxicillin, Cephalosporin, Keflex, Levoquin, Cipro, Sulfa.  [name], [facility] 08/07/2020, 4:35 PM

## 2020-08-07 NOTE — Telephone Encounter (Signed)
Home Health Verbal Orders - Caller/Agency: [facility]   Requesting OT/PT/Skilled Nursing/Social Work/Speech Therapy: pt's catheter was removed and now she is showing symptoms of a UTI.  They want orders for UA and possible antibiotics.

## 2020-08-07 NOTE — Telephone Encounter (Signed)
Verbal given to collect U/A and urine culture but not for antibiotics.

## 2020-08-12 ENCOUNTER — Ambulatory Visit: Payer: Self-pay

## 2020-08-12 DIAGNOSIS — Z79890 Hormone replacement therapy (postmenopausal): Secondary | ICD-10-CM | POA: Diagnosis not present

## 2020-08-12 DIAGNOSIS — Z7982 Long term (current) use of aspirin: Secondary | ICD-10-CM | POA: Diagnosis not present

## 2020-08-12 DIAGNOSIS — Z79899 Other long term (current) drug therapy: Secondary | ICD-10-CM | POA: Diagnosis not present

## 2020-08-12 DIAGNOSIS — N390 Urinary tract infection, site not specified: Secondary | ICD-10-CM | POA: Diagnosis not present

## 2020-08-12 DIAGNOSIS — R339 Retention of urine, unspecified: Secondary | ICD-10-CM | POA: Diagnosis not present

## 2020-08-12 DIAGNOSIS — F419 Anxiety disorder, unspecified: Secondary | ICD-10-CM | POA: Diagnosis not present

## 2020-08-12 DIAGNOSIS — K5900 Constipation, unspecified: Secondary | ICD-10-CM | POA: Diagnosis not present

## 2020-08-12 DIAGNOSIS — E890 Postprocedural hypothyroidism: Secondary | ICD-10-CM | POA: Diagnosis not present

## 2020-08-12 DIAGNOSIS — I10 Essential (primary) hypertension: Secondary | ICD-10-CM | POA: Diagnosis not present

## 2020-08-12 DIAGNOSIS — G8929 Other chronic pain: Secondary | ICD-10-CM | POA: Diagnosis not present

## 2020-08-12 DIAGNOSIS — Z6821 Body mass index (BMI) 21.0-21.9, adult: Secondary | ICD-10-CM | POA: Diagnosis not present

## 2020-08-12 DIAGNOSIS — B9620 Unspecified Escherichia coli [E. coli] as the cause of diseases classified elsewhere: Secondary | ICD-10-CM | POA: Diagnosis not present

## 2020-08-12 DIAGNOSIS — R52 Pain, unspecified: Secondary | ICD-10-CM | POA: Diagnosis not present

## 2020-08-12 NOTE — Progress Notes (Signed)
  Chronic Care Management    Clinical Social Work General Follow Up Note  08/12/2020 Name: [name] MRN: [id number] DOB: [birth year]  [name] is a 86 y.o. year old female who is a primary care patient of Olin Hauser, DO. The CCM team was consulted for assistance with Level of Care Concerns.   Review of patient status, including review of consultants reports, relevant laboratory and other test results, and collaboration with appropriate care team members and the patient's provider was performed as part of comprehensive patient evaluation and provision of chronic care management services.    LCSW completed CCM outreach attempt today but was unable to reach patient successfully. A HIPPA compliant voice message was left encouraging patient to return call once available. LCSW will reschedule CCM SW appointment for patient as well.  Outpatient Encounter Medications as of 08/12/2020  Medication Sig Note  . amLODipine (NORVASC) 5 MG tablet Take 1 tablet (5 mg total) by mouth daily.   . carvedilol (COREG) 6.25 MG tablet Take 1 tablet (6.25 mg total) by mouth 2 (two) times daily with a meal.   . EPINEPHrine 0.3 mg/0.3 mL IJ SOAJ injection    . FLUoxetine (PROZAC) 20 MG capsule Take 1 capsule (20 mg total) by mouth daily.   . furosemide (LASIX) 20 MG tablet Take 1 tablet (20 mg total) by mouth daily.   [name] HYDROcodone-acetaminophen (NORCO) 10-325 MG tablet Take 1 tablet by mouth every 4 (four) hours as needed for moderate pain. six tablets a day every four hours --per pain management 07/29/2018: Takes fairly regularly   . LORazepam (ATIVAN) 0.5 MG tablet Take 1 tablet (0.5 mg total) by mouth 4 (four) times daily as needed for anxiety.   . mirtazapine (REMERON) 30 MG tablet Take 1 tablet (30 mg total) by mouth at bedtime.   . tamsulosin (FLOMAX) 0.4 MG CAPS capsule Take 1 capsule (0.4 mg total) by mouth daily.    No facility-administered encounter medications on file as of  08/12/2020.    Follow Up Plan: SW will follow up with patient by phone over the next quarter   Eula Fried, [place], MSW, [email or web address] Phone: [phone]

## 2020-08-12 NOTE — Telephone Encounter (Signed)
FYI - No urine results while you were gone

## 2020-08-14 DIAGNOSIS — Z483 Aftercare following surgery for neoplasm: Secondary | ICD-10-CM | POA: Diagnosis not present

## 2020-08-14 DIAGNOSIS — E890 Postprocedural hypothyroidism: Secondary | ICD-10-CM | POA: Diagnosis not present

## 2020-08-14 DIAGNOSIS — E892 Postprocedural hypoparathyroidism: Secondary | ICD-10-CM | POA: Diagnosis not present

## 2020-08-14 DIAGNOSIS — I10 Essential (primary) hypertension: Secondary | ICD-10-CM | POA: Diagnosis not present

## 2020-08-14 DIAGNOSIS — C73 Malignant neoplasm of thyroid gland: Secondary | ICD-10-CM | POA: Diagnosis not present

## 2020-08-14 DIAGNOSIS — F419 Anxiety disorder, unspecified: Secondary | ICD-10-CM | POA: Diagnosis not present

## 2020-08-20 DIAGNOSIS — C73 Malignant neoplasm of thyroid gland: Secondary | ICD-10-CM | POA: Diagnosis not present

## 2020-08-20 NOTE — Progress Notes (Signed)
08/21/2020 7:11 PM   [name] April 15, 1925 [id number]  Referring provider: Olin Hauser, DO [address] [place],  [place] [address]  Chief Complaint  Patient presents with  . Urinary Retention    HPI: [name] is a 86 y.o. female with a PMH of urinary retention and 1 cm left renal mass who presents for a TOV.  Urinary retention Patient was seen at the [facility] emergency department for urinary retention on August 12, 2020.  Foley catheter was placed with greater than 1000 cc obtained.  Her urine culture is also positive for ill E. coli that was resistant to ampicillin, Levaquin and Cipro.  She was started on fosfomycin.   Her Foley was removed this morning and returns this afternoon for recheck.  She has not urinating and is having bladder pain.  Patient denies any modifying or aggravating factors.  Patient denies any gross hematuria, dysuria or flank pain.  Patient denies any fevers, chills, nausea or vomiting.   PVR is 417 mL.   1 cm left renal mass Family has decided they do not want any further surveillance of this mass.  Please see previous notes.  PMH: Past Medical History:  Diagnosis Date  . Allergy    environmental  . Anxiety   . Chronic back pain   . High cholesterol   . History of urinary retention   . Hypertension   . Migraines   . Osteoporosis   . Pneumonia   . Sinusitis     Surgical History: Past Surgical History:  Procedure Laterality Date  . ABDOMINAL HYSTERECTOMY    . APPENDECTOMY    . BACK SURGERY    . BACK SURGERY  2008   2009  . BREAST BIOPSY Right 2002   neg  . HEMORRHOID SURGERY    . MOUTH SURGERY  2019  . TONSILLECTOMY      Home Medications:  Allergies as of 08/21/2020      Reactions   Amoxicillin Itching, Swelling, Rash   Anaphylactoid reaction occurred around the time that the patient took both Amoxicillin and Cipro. ED notes state this reaction was to the Amoxicillin, patient's family reports it was to  the Cipro.   Nitrofurantoin    swelling   Ceftin [cefuroxime]    Cephalosporins    Cortisone Other (See Comments)   Headache   Fluticasone Swelling   Facial   Levofloxacin Nausea And Vomiting   Meperidine Other (See Comments)   Morphine And Related Other (See Comments)   headache   Morphine Sulfate    Nisoldipine    Penicillin G Potassium In D5w    Prednisone Other (See Comments)   Headache   Sulfa Antibiotics Nausea Only   Cephalexin Swelling   Lip swelling with Keflex prescribed in January 2021. Family administered benadryl.   Ciprofloxacin Rash      Medication List       Accurate as of August 21, 2020 11:59 PM. If you have any questions, ask your nurse or doctor.        amLODipine 5 MG tablet Commonly known as: NORVASC Take 1 tablet (5 mg total) by mouth daily.   calcitRIOL 1 MCG/ML solution Commonly known as: ROCALTROL   carvedilol 6.25 MG tablet Commonly known as: COREG Take 1 tablet (6.25 mg total) by mouth 2 (two) times daily with a meal.   EPINEPHrine 0.3 mg/0.3 mL Soaj injection Commonly known as: EPI-PEN   FLUoxetine 20 MG capsule Commonly known as:  PROZAC Take 1 capsule (20 mg total) by mouth daily.   furosemide 20 MG tablet Commonly known as: LASIX Take 1 tablet (20 mg total) by mouth daily.   HYDROcodone-acetaminophen 10-325 MG tablet Commonly known as: NORCO Take 1 tablet by mouth every 4 (four) hours as needed for moderate pain. six tablets a day every four hours --per pain management   levothyroxine 25 MCG tablet Commonly known as: SYNTHROID   LORazepam 0.5 MG tablet Commonly known as: ATIVAN Take 1 tablet (0.5 mg total) by mouth 4 (four) times daily as needed for anxiety.   mirtazapine 30 MG tablet Commonly known as: REMERON Take 1 tablet (30 mg total) by mouth at bedtime.   tamsulosin 0.4 MG Caps capsule Commonly known as: FLOMAX Take 1 capsule (0.4 mg total) by mouth daily.       Allergies:  Allergies  Allergen  Reactions  . Amoxicillin Itching, Swelling and Rash    Anaphylactoid reaction occurred around the time that the patient took both Amoxicillin and Cipro. ED notes state this reaction was to the Amoxicillin, patient's family reports it was to the Cipro.  . Nitrofurantoin     swelling  . Ceftin [Cefuroxime]   . Cephalosporins   . Cortisone Other (See Comments)    Headache   . Fluticasone Swelling    Facial  . Levofloxacin Nausea And Vomiting  . Meperidine Other (See Comments)  . Morphine And Related Other (See Comments)    headache  . Morphine Sulfate   . Nisoldipine   . Penicillin G Potassium In D5w   . Prednisone Other (See Comments)    Headache   . Sulfa Antibiotics Nausea Only  . Cephalexin Swelling    Lip swelling with Keflex prescribed in January 2021. Family administered benadryl.  . Ciprofloxacin Rash    Family History: Family History  Problem Relation Age of Onset  . Breast cancer Sister 22  . Breast cancer Maternal Grandmother 59  . Breast cancer Sister 60  . Bladder Cancer Brother   . Hypertension Neg Hx   . Kidney cancer Neg Hx     Social History:  reports that she has never smoked. She has never used smokeless tobacco. She reports that she does not drink alcohol and does not use drugs.  ROS: Pertinent ROS in HPI  Physical Exam: There were no vitals taken for this visit.  Constitutional:  Well nourished. Alert and oriented, No acute distress. HEENT: [place] AT, mask in place.  Trachea midline Cardiovascular: No clubbing, cyanosis, or edema. Respiratory: Normal respiratory effort, no increased work of breathing. Neurologic: Grossly intact, no focal deficits, moving all 4 extremities. Psychiatric: Normal mood and affect.   Laboratory Data: Lab Results  Component Value Date   WBC 6.8 04/16/2020   HGB 10.7 (L) 04/16/2020   HCT 32.3 (L) 04/16/2020   MCV 74.8 (L) 04/16/2020   PLT 192 04/16/2020    Lab Results  Component Value Date   CREATININE 0.61  04/16/2020    Lab Results  Component Value Date   TSH 1.279 06/24/2019    Lab Results  Component Value Date   AST 22 04/16/2020   Lab Results  Component Value Date   ALT 12 04/16/2020    Urinalysis    Component Value Date/Time   COLORURINE YELLOW (A) 04/16/2020 1737   APPEARANCEUR Clear 08/21/2020 1429   LABSPEC 1.005 04/16/2020 1737   LABSPEC 1.004 08/05/2014 0115   PHURINE 7.0 04/16/2020 1737   GLUCOSEU Negative 08/21/2020 1429  GLUCOSEU Negative 08/05/2014 0115   HGBUR NEGATIVE 04/16/2020 1737   BILIRUBINUR Negative 08/21/2020 1429   BILIRUBINUR Negative 08/05/2014 0115   KETONESUR NEGATIVE 04/16/2020 1737   PROTEINUR Negative 08/21/2020 1429   PROTEINUR NEGATIVE 04/16/2020 1737   UROBILINOGEN 0.2 06/26/2019 1322   UROBILINOGEN 0.2 03/07/2008 1048   NITRITE Negative 08/21/2020 1429   NITRITE NEGATIVE 04/16/2020 1737   LEUKOCYTESUR Negative 08/21/2020 1429   LEUKOCYTESUR SMALL (A) 04/16/2020 1737   LEUKOCYTESUR Negative 08/05/2014 0115    I have reviewed the labs.  Simple Catheter Placement Due to urinary retention patient is present today for a foley cath placement.  Patient was cleaned and prepped in a sterile fashion with betadine. A 16 FR foley catheter was inserted, urine return was noted  400 ml, urine was yellow clear in color.  The balloon was filled with 10cc of sterile water and attached to a night bag.  Patient was given instruction on proper catheter care.  Patient tolerated well, no complications were noted   Performed by: Zara Council, PA-C and Kerman Passey, CMA  Pertinent Imaging: Results for Charlene, Bailey (MRN [id number]) as of 08/24/2020 19:09  Ref. Range 08/21/2020 14:42  Scan Result Unknown 417 ml    Assessment & Plan:    1. Urinary retention Foley catheter replaced RTC in one week for TOV  2. Left renal mass Family has decided that they do not want further surveillance of this mass.  See previous notes.   Return in  about 1 week (around 08/28/2020) for TOV.  These notes generated with voice recognition software. I apologize for typographical errors.  Zara Council, PA-C  [facility] [address]  [place], [place] [address] [phone]

## 2020-08-21 ENCOUNTER — Ambulatory Visit (INDEPENDENT_AMBULATORY_CARE_PROVIDER_SITE_OTHER): Payer: Medicare Other | Admitting: Urology

## 2020-08-21 ENCOUNTER — Ambulatory Visit: Payer: Medicare Other | Admitting: Urology

## 2020-08-21 ENCOUNTER — Other Ambulatory Visit: Payer: Self-pay

## 2020-08-21 DIAGNOSIS — R300 Dysuria: Secondary | ICD-10-CM

## 2020-08-21 DIAGNOSIS — N2889 Other specified disorders of kidney and ureter: Secondary | ICD-10-CM

## 2020-08-21 DIAGNOSIS — R338 Other retention of urine: Secondary | ICD-10-CM

## 2020-08-21 LAB — BLADDER SCAN AMB NON-IMAGING: Scan Result: 417

## 2020-08-21 NOTE — Progress Notes (Signed)
Catheter Removal  Patient is present today for a catheter removal.  10 ml of water was drained from the balloon. A 16FR foley cath was removed from the bladder no complications were noted . Patient tolerated well.  Performed by: Kerman Passey, RMA`  Follow up/ Additional notes: Advised pt to drink a lot of water and return this afternoon at 2pm for a PVR.

## 2020-08-21 NOTE — Patient Instructions (Signed)
Indwelling Urinary Catheter Care, Adult An indwelling urinary catheter is a thin tube that is put into your bladder. The tube helps to drain pee (urine) out of your body. The tube goes in through your urethra. Your urethra is where pee comes out of your body. Your pee will come out through the catheter, then it will go into a bag (drainage bag). Take good care of your catheter so it will work well. How to wear your catheter and bag Supplies needed  Sticky tape (adhesive tape) or a leg strap.  Alcohol wipe or soap and water (if you use tape).  A clean towel (if you use tape).  Large overnight bag.  Smaller bag (leg bag). Wearing your catheter Attach your catheter to your leg with tape or a leg strap.  Make sure the catheter is not pulled tight.  If a leg strap gets wet, take it off and put on a dry strap.  If you use tape to hold the bag on your leg: 1. Use an alcohol wipe or soap and water to wash your skin where the tape made it sticky before. 2. Use a clean towel to pat-dry that skin. 3. Use new tape to make the bag stay on your leg. Wearing your bags You should have been given a large overnight bag.  You may wear the overnight bag in the day or night.  Always have the overnight bag lower than your bladder.  Do not let the bag touch the floor.  Before you go to sleep, put a clean plastic bag in a wastebasket. Then hang the overnight bag inside the wastebasket. You should also have a smaller leg bag that fits under your clothes.  Always wear the leg bag below your knee.  Do not wear your leg bag at night. How to care for your skin and catheter Supplies needed  A clean washcloth.  Water and mild soap.  A clean towel. Caring for your skin and catheter      Clean the skin around your catheter every day: 1. Wash your hands with soap and water. 2. Wet a clean washcloth in warm water and mild soap. 3. Clean the skin around your urethra.  If you are  female:  Gently spread the folds of skin around your vagina (labia).  With the washcloth in your other hand, wipe the inner side of your labia on each side. Wipe from front to back.  If you are female:  Pull back any skin that covers the end of your penis (foreskin).  With the washcloth in your other hand, wipe your penis in small circles. Start wiping at the tip of your penis, then move away from the catheter.  Move the foreskin back in place, if needed. 4. With your free hand, hold the catheter close to where it goes into your body.  Keep holding the catheter during cleaning so it does not get pulled out. 5. With the washcloth in your other hand, clean the catheter.  Only wipe downward on the catheter.  Do not wipe upward toward your body. Doing this may push germs into your urethra and cause infection. 6. Use a clean towel to pat-dry the catheter and the skin around it. Make sure to wipe off all soap. 7. Wash your hands with soap and water.  Shower every day. Do not take baths.  Do not use cream, ointment, or lotion on the area where the catheter goes into your body, unless your doctor tells you   to.  Do not use powders, sprays, or lotions on your genital area.  Check your skin around the catheter every day for signs of infection. Check for: ? Redness, swelling, or pain. ? Fluid or blood. ? Warmth. ? Pus or a bad smell. How to empty the bag Supplies needed  Rubbing alcohol.  Gauze pad or cotton ball.  Tape or a leg strap. Emptying the bag Pour the pee out of your bag when it is ?- full, or at least 2-3 times a day. Do this for your overnight bag and your leg bag. 1. Wash your hands with soap and water. 2. Separate (detach) the bag from your leg. 3. Hold the bag over the toilet or a clean pail. Keep the bag lower than your hips and bladder. This is so the pee (urine) does not go back into the tube. 4. Open the pour spout. It is at the bottom of the bag. 5. Empty the  pee into the toilet or pail. Do not let the pour spout touch any surface. 6. Put rubbing alcohol on a gauze pad or cotton ball. 7. Use the gauze pad or cotton ball to clean the pour spout. 8. Close the pour spout. 9. Attach the bag to your leg with tape or a leg strap. 10. Wash your hands with soap and water. Follow instructions for cleaning the drainage bag:  From the product maker.  As told by your doctor. How to change the bag Supplies needed  Alcohol wipes.  A clean bag.  Tape or a leg strap. Changing the bag Replace your bag when it starts to leak, smell bad, or look dirty. 1. Wash your hands with soap and water. 2. Separate the dirty bag from your leg. 3. Pinch the catheter with your fingers so that pee does not spill out. 4. Separate the catheter tube from the bag tube where these tubes connect (at the connection valve). Do not let the tubes touch any surface. 5. Clean the end of the catheter tube with an alcohol wipe. Use a different alcohol wipe to clean the end of the bag tube. 6. Connect the catheter tube to the tube of the clean bag. 7. Attach the clean bag to your leg with tape or a leg strap. Do not make the bag tight on your leg. 8. Wash your hands with soap and water. General rules   Never pull on your catheter. Never try to take it out. Doing that can hurt you.  Always wash your hands before and after you touch your catheter or bag. Use a mild, fragrance-free soap. If you do not have soap and water, use hand sanitizer.  Always make sure there are no twists or bends (kinks) in the catheter tube.  Always make sure there are no leaks in the catheter or bag.  Drink enough fluid to keep your pee pale yellow.  Do not take baths, swim, or use a hot tub.  If you are female, wipe from front to back after you poop (have a bowel movement). Contact a doctor if:  Your pee is cloudy.  Your pee smells worse than usual.  Your catheter gets clogged.  Your catheter  leaks.  Your bladder feels full. Get help right away if:  You have redness, swelling, or pain where the catheter goes into your body.  You have fluid, blood, pus, or a bad smell coming from the area where the catheter goes into your body.  Your skin feels warm where   the catheter goes into your body.  You have a fever.  You have pain in your: ? Belly (abdomen). ? Legs. ? Lower back. ? Bladder.  You see blood in the catheter.  Your pee is pink or red.  You feel sick to your stomach (nauseous).  You throw up (vomit).  You have chills.  Your pee is not draining into the bag.  Your catheter gets pulled out. Summary  An indwelling urinary catheter is a thin tube that is placed into the bladder to help drain pee (urine) out of the body.  The catheter is placed into the part of the body that drains pee from the bladder (urethra).  Taking good care of your catheter will keep it working properly and help prevent problems.  Always wash your hands before and after touching your catheter or bag.  Never pull on your catheter or try to take it out. This information is not intended to replace advice given to you by your health care provider. Make sure you discuss any questions you have with your health care provider. Document Revised: 01/19/2019 Document Reviewed: 05/13/2017 Elsevier Patient Education  2020 Elsevier Inc.  

## 2020-08-22 ENCOUNTER — Telehealth: Payer: Self-pay | Admitting: Family Medicine

## 2020-08-22 DIAGNOSIS — Z483 Aftercare following surgery for neoplasm: Secondary | ICD-10-CM | POA: Diagnosis not present

## 2020-08-22 DIAGNOSIS — E890 Postprocedural hypothyroidism: Secondary | ICD-10-CM | POA: Diagnosis not present

## 2020-08-22 DIAGNOSIS — E892 Postprocedural hypoparathyroidism: Secondary | ICD-10-CM | POA: Diagnosis not present

## 2020-08-22 DIAGNOSIS — I10 Essential (primary) hypertension: Secondary | ICD-10-CM | POA: Diagnosis not present

## 2020-08-22 DIAGNOSIS — C73 Malignant neoplasm of thyroid gland: Secondary | ICD-10-CM | POA: Diagnosis not present

## 2020-08-22 DIAGNOSIS — F419 Anxiety disorder, unspecified: Secondary | ICD-10-CM | POA: Diagnosis not present

## 2020-08-22 LAB — MICROSCOPIC EXAMINATION: Epithelial Cells (non renal): NONE SEEN /hpf (ref 0–10)

## 2020-08-22 LAB — URINALYSIS, COMPLETE
Bilirubin, UA: NEGATIVE
Glucose, UA: NEGATIVE
Ketones, UA: NEGATIVE
Leukocytes,UA: NEGATIVE
Nitrite, UA: NEGATIVE
Protein,UA: NEGATIVE
RBC, UA: NEGATIVE
Specific Gravity, UA: 1.015 (ref 1.005–1.030)
Urobilinogen, Ur: 0.2 mg/dL (ref 0.2–1.0)
pH, UA: 7.5 (ref 5.0–7.5)

## 2020-08-22 NOTE — Telephone Encounter (Signed)
Copied from [place] [id number]. Topic: Medicare AWV >> Aug 22, 2020 10:55 AM Cher Nakai R wrote: Reason for CRM: Left message for patient to call back and schedule the Medicare Annual Wellness Visit (AWV) virtually.  Last AWV 10/24/2017  Please schedule at anytime with [place].  40 minute appointment  Any questions, please call me at [phone]

## 2020-08-24 ENCOUNTER — Encounter: Payer: Self-pay | Admitting: Urology

## 2020-08-26 ENCOUNTER — Telehealth: Payer: Self-pay | Admitting: Family Medicine

## 2020-08-26 LAB — CULTURE, URINE COMPREHENSIVE

## 2020-08-26 NOTE — Telephone Encounter (Signed)
Patient notified and voiced understanding.

## 2020-08-26 NOTE — Telephone Encounter (Signed)
-----   Message from Nori Riis, PA-C sent at 08/26/2020 12:57 PM EST ----- Please let Mrs. Rodak's daughter know that her urine culture grew out a tiny amount of bacteria.  We typically do not treat this amount of grow unless the patient is symptomatic.  If she is doing well, I advise against antibiotics.

## 2020-08-27 NOTE — Progress Notes (Signed)
08/28/2020 3:26 PM   [name] 1925-01-18 [id number]  Referring provider: Olin Hauser, DO [address] [place],  [place] [address]  Chief Complaint  Patient presents with  . Urinary Retention    HPI: [name] is a 86 y.o. female with a PMH of urinary retention and 1 cm left renal mass who presents for a TOV.  Urinary retention Patient was seen at the [facility] emergency department for urinary retention on August 12, 2020.  Foley catheter was placed with greater than 1000 cc obtained.  Her urine culture is also positive for E. coli that was resistant to ampicillin, Levaquin and Cipro.  She was started on fosfomycin.   Her Foley was removed on 08/21/2020 and returned that and was found to have 417 mL in her bladder.  A Foley was placed.  She presents today for a second attempt at a trial of void.    1 cm left renal mass Family has decided they do not want any further surveillance of this mass.  Please see previous notes.  PMH: Past Medical History:  Diagnosis Date  . Allergy    environmental  . Anxiety   . Chronic back pain   . High cholesterol   . History of urinary retention   . Hypertension   . Migraines   . Osteoporosis   . Pneumonia   . Sinusitis     Surgical History: Past Surgical History:  Procedure Laterality Date  . ABDOMINAL HYSTERECTOMY    . APPENDECTOMY    . BACK SURGERY    . BACK SURGERY  2008   2009  . BREAST BIOPSY Right 2002   neg  . HEMORRHOID SURGERY    . MOUTH SURGERY  2019  . TONSILLECTOMY      Home Medications:  Allergies as of 08/28/2020      Reactions   Amoxicillin Itching, Swelling, Rash   Anaphylactoid reaction occurred around the time that the patient took both Amoxicillin and Cipro. ED notes state this reaction was to the Amoxicillin, patient's family reports it was to the Cipro.   Nitrofurantoin    swelling   Ceftin [cefuroxime]    Cephalosporins    Cortisone Other (See Comments)   Headache    Fluticasone Swelling   Facial   Levofloxacin Nausea And Vomiting   Meperidine Other (See Comments)   Morphine And Related Other (See Comments)   headache   Morphine Sulfate    Nisoldipine    Penicillin G Potassium In D5w    Prednisone Other (See Comments)   Headache   Sulfa Antibiotics Nausea Only   Cephalexin Swelling   Lip swelling with Keflex prescribed in January 2021. Family administered benadryl.   Ciprofloxacin Rash      Medication List       Accurate as of August 28, 2020  3:26 PM. If you have any questions, ask your nurse or doctor.        amLODipine 5 MG tablet Commonly known as: NORVASC Take 1 tablet (5 mg total) by mouth daily.   calcitRIOL 1 MCG/ML solution Commonly known as: ROCALTROL   carvedilol 6.25 MG tablet Commonly known as: COREG Take 1 tablet (6.25 mg total) by mouth 2 (two) times daily with a meal.   EPINEPHrine 0.3 mg/0.3 mL Soaj injection Commonly known as: EPI-PEN   FLUoxetine 20 MG capsule Commonly known as: PROZAC Take 1 capsule (20 mg total) by mouth daily.   furosemide 20 MG tablet Commonly  known as: LASIX Take 1 tablet (20 mg total) by mouth daily.   HYDROcodone-acetaminophen 10-325 MG tablet Commonly known as: NORCO Take 1 tablet by mouth every 4 (four) hours as needed for moderate pain. six tablets a day every four hours --per pain management   levothyroxine 25 MCG tablet Commonly known as: SYNTHROID   LORazepam 0.5 MG tablet Commonly known as: ATIVAN Take 1 tablet (0.5 mg total) by mouth 4 (four) times daily as needed for anxiety.   mirtazapine 30 MG tablet Commonly known as: REMERON Take 1 tablet (30 mg total) by mouth at bedtime.   tamsulosin 0.4 MG Caps capsule Commonly known as: FLOMAX Take 1 capsule (0.4 mg total) by mouth daily.       Allergies:  Allergies  Allergen Reactions  . Amoxicillin Itching, Swelling and Rash    Anaphylactoid reaction occurred around the time that the patient took both  Amoxicillin and Cipro. ED notes state this reaction was to the Amoxicillin, patient's family reports it was to the Cipro.  . Nitrofurantoin     swelling  . Ceftin [Cefuroxime]   . Cephalosporins   . Cortisone Other (See Comments)    Headache   . Fluticasone Swelling    Facial  . Levofloxacin Nausea And Vomiting  . Meperidine Other (See Comments)  . Morphine And Related Other (See Comments)    headache  . Morphine Sulfate   . Nisoldipine   . Penicillin G Potassium In D5w   . Prednisone Other (See Comments)    Headache   . Sulfa Antibiotics Nausea Only  . Cephalexin Swelling    Lip swelling with Keflex prescribed in January 2021. Family administered benadryl.  . Ciprofloxacin Rash    Family History: Family History  Problem Relation Age of Onset  . Breast cancer Sister 17  . Breast cancer Maternal Grandmother 65  . Breast cancer Sister 30  . Bladder Cancer Brother   . Hypertension Neg Hx   . Kidney cancer Neg Hx     Social History:  reports that she has never smoked. She has never used smokeless tobacco. She reports that she does not drink alcohol and does not use drugs.  ROS: Pertinent ROS in HPI  Physical Exam: BP 108/68   Pulse 70   Wt 115 lb (52.2 kg)   BMI 22.46 kg/m   Constitutional:  Well nourished. Alert and oriented, No acute distress. HEENT: [place] AT, mask in place.  Trachea midline Cardiovascular: No clubbing, cyanosis, or edema. Respiratory: Normal respiratory effort, no increased work of breathing. Neurologic: Grossly intact, no focal deficits, moving all 4 extremities. Psychiatric: Normal mood and affect.   Laboratory Data: Lab Results  Component Value Date   WBC 6.8 04/16/2020   HGB 10.7 (L) 04/16/2020   HCT 32.3 (L) 04/16/2020   MCV 74.8 (L) 04/16/2020   PLT 192 04/16/2020    Lab Results  Component Value Date   CREATININE 0.61 04/16/2020    Lab Results  Component Value Date   TSH 1.279 06/24/2019    Lab Results  Component Value  Date   AST 22 04/16/2020   Lab Results  Component Value Date   ALT 12 04/16/2020    Urinalysis    Component Value Date/Time   COLORURINE YELLOW (A) 04/16/2020 1737   APPEARANCEUR Clear 08/21/2020 1429   LABSPEC 1.005 04/16/2020 1737   LABSPEC 1.004 08/05/2014 0115   PHURINE 7.0 04/16/2020 1737   GLUCOSEU Negative 08/21/2020 1429   GLUCOSEU Negative 08/05/2014 0115  HGBUR NEGATIVE 04/16/2020 1737   BILIRUBINUR Negative 08/21/2020 1429   BILIRUBINUR Negative 08/05/2014 0115   KETONESUR NEGATIVE 04/16/2020 1737   PROTEINUR Negative 08/21/2020 1429   PROTEINUR NEGATIVE 04/16/2020 1737   UROBILINOGEN 0.2 06/26/2019 1322   UROBILINOGEN 0.2 03/07/2008 1048   NITRITE Negative 08/21/2020 1429   NITRITE NEGATIVE 04/16/2020 1737   LEUKOCYTESUR Negative 08/21/2020 1429   LEUKOCYTESUR SMALL (A) 04/16/2020 1737   LEUKOCYTESUR Negative 08/05/2014 0115    I have reviewed the labs.  Catheter Removal Patient is present today for a catheter removal.  9 ml of water was drained from the balloon. A 16 FR foley cath was removed from the bladder no complications were noted . Patient tolerated well.  Performed by: Zara Council, PA-C  Pertinent Imaging: Results for MALLISA, ALAMEDA (MRN [id number]) as of 08/28/2020 15:25  Ref. Range 08/28/2020 14:49  Scan Result Unknown 89 ml     Assessment & Plan:    1. Urinary retention Resolved for now Patient return in 1 month for follow-up PVR Patient is to report either to [place] or the emergency room if she is unable to urinate  2. Left renal mass Family has decided that they do not want further surveillance of this mass.  See previous notes.   Return in about 1 month (around 09/27/2020) for PVR.  These notes generated with voice recognition software. I apologize for typographical errors.  Zara Council, PA-C  [facility] [address]  [place], [place] [address] [phone]

## 2020-08-28 ENCOUNTER — Ambulatory Visit: Payer: Medicare Other | Admitting: Urology

## 2020-08-28 ENCOUNTER — Other Ambulatory Visit: Payer: Self-pay

## 2020-08-28 ENCOUNTER — Ambulatory Visit (INDEPENDENT_AMBULATORY_CARE_PROVIDER_SITE_OTHER): Payer: Medicare Other | Admitting: Urology

## 2020-08-28 ENCOUNTER — Encounter: Payer: Self-pay | Admitting: Urology

## 2020-08-28 VITALS — BP 108/68 | HR 70 | Wt 115.0 lb

## 2020-08-28 DIAGNOSIS — R338 Other retention of urine: Secondary | ICD-10-CM | POA: Diagnosis not present

## 2020-08-28 LAB — BLADDER SCAN AMB NON-IMAGING: Scan Result: 89

## 2020-08-29 DIAGNOSIS — I10 Essential (primary) hypertension: Secondary | ICD-10-CM | POA: Diagnosis not present

## 2020-08-29 DIAGNOSIS — Z483 Aftercare following surgery for neoplasm: Secondary | ICD-10-CM | POA: Diagnosis not present

## 2020-08-29 DIAGNOSIS — E892 Postprocedural hypoparathyroidism: Secondary | ICD-10-CM | POA: Diagnosis not present

## 2020-08-29 DIAGNOSIS — C73 Malignant neoplasm of thyroid gland: Secondary | ICD-10-CM | POA: Diagnosis not present

## 2020-08-29 DIAGNOSIS — E890 Postprocedural hypothyroidism: Secondary | ICD-10-CM | POA: Diagnosis not present

## 2020-08-29 DIAGNOSIS — F419 Anxiety disorder, unspecified: Secondary | ICD-10-CM | POA: Diagnosis not present

## 2020-09-03 DIAGNOSIS — I10 Essential (primary) hypertension: Secondary | ICD-10-CM | POA: Diagnosis not present

## 2020-09-03 DIAGNOSIS — E892 Postprocedural hypoparathyroidism: Secondary | ICD-10-CM | POA: Diagnosis not present

## 2020-09-03 DIAGNOSIS — E890 Postprocedural hypothyroidism: Secondary | ICD-10-CM | POA: Diagnosis not present

## 2020-09-03 DIAGNOSIS — F419 Anxiety disorder, unspecified: Secondary | ICD-10-CM | POA: Diagnosis not present

## 2020-09-03 DIAGNOSIS — C73 Malignant neoplasm of thyroid gland: Secondary | ICD-10-CM | POA: Diagnosis not present

## 2020-09-03 DIAGNOSIS — Z483 Aftercare following surgery for neoplasm: Secondary | ICD-10-CM | POA: Diagnosis not present

## 2020-09-12 DIAGNOSIS — Z880 Allergy status to penicillin: Secondary | ICD-10-CM | POA: Diagnosis not present

## 2020-09-12 DIAGNOSIS — Z7982 Long term (current) use of aspirin: Secondary | ICD-10-CM | POA: Diagnosis not present

## 2020-09-12 DIAGNOSIS — K219 Gastro-esophageal reflux disease without esophagitis: Secondary | ICD-10-CM | POA: Diagnosis not present

## 2020-09-12 DIAGNOSIS — Z885 Allergy status to narcotic agent status: Secondary | ICD-10-CM | POA: Diagnosis not present

## 2020-09-12 DIAGNOSIS — R339 Retention of urine, unspecified: Secondary | ICD-10-CM | POA: Diagnosis not present

## 2020-09-12 DIAGNOSIS — N3000 Acute cystitis without hematuria: Secondary | ICD-10-CM | POA: Diagnosis not present

## 2020-09-12 DIAGNOSIS — I10 Essential (primary) hypertension: Secondary | ICD-10-CM | POA: Diagnosis not present

## 2020-09-12 DIAGNOSIS — B9620 Unspecified Escherichia coli [E. coli] as the cause of diseases classified elsewhere: Secondary | ICD-10-CM | POA: Diagnosis not present

## 2020-09-12 DIAGNOSIS — J439 Emphysema, unspecified: Secondary | ICD-10-CM | POA: Diagnosis not present

## 2020-09-12 DIAGNOSIS — Z881 Allergy status to other antibiotic agents status: Secondary | ICD-10-CM | POA: Diagnosis not present

## 2020-09-15 ENCOUNTER — Telehealth: Payer: Self-pay | Admitting: Family Medicine

## 2020-09-15 DIAGNOSIS — C73 Malignant neoplasm of thyroid gland: Secondary | ICD-10-CM

## 2020-09-15 DIAGNOSIS — R413 Other amnesia: Secondary | ICD-10-CM

## 2020-09-15 DIAGNOSIS — F411 Generalized anxiety disorder: Secondary | ICD-10-CM

## 2020-09-15 NOTE — Telephone Encounter (Signed)
Patient daughter Rushie Brazel [phone] is calling because the family is ready to discuss orders being placed for the patient for palliative care/hospice orders.  The last time the patient saw Dr. Raliegh Ip. Discussed a knot in the patients throat. Since then the patient has had thyroid cancer surgery on 07/29/20

## 2020-09-16 NOTE — Telephone Encounter (Signed)
Last visit with me was telemedicine 06/27/20, it has been less than 3 months, I am okay to proceed with referral to Palliative Care for assessment. They can help determine if appropriate for hospice vs palliative care to initiate services.  [employer] will be the company.  Please notify them to anticipate someone to contact them and arrange a time to come out to the home to do their initial assessment and discuss options and plan.  If they do not hear back within 2 weeks, they can contact [place], [place] [address] Ph: [place] 09/16/2020, 10:40 AM

## 2020-09-16 NOTE — Telephone Encounter (Signed)
Susan notified.

## 2020-09-17 ENCOUNTER — Telehealth: Payer: Self-pay

## 2020-09-17 NOTE — Telephone Encounter (Signed)
4PM: Palliative care SW outreached patients daughter, [name], to schedule initial palliative care visit.   Call unsuccessful. SW unable to LVM. Will try again at later date.

## 2020-09-19 ENCOUNTER — Telehealth: Payer: Self-pay

## 2020-09-19 NOTE — Telephone Encounter (Signed)
(  4:11pm) SW called to schedule the initial palliative care visit with patient. Visit is scheduled for 09/22/20 @ 12pm

## 2020-09-23 ENCOUNTER — Telehealth: Payer: Self-pay

## 2020-09-23 NOTE — Telephone Encounter (Signed)
0900: Palliative care SW outreached daughter to re-schedule in home visit for Fri 12/17 @11am .

## 2020-09-24 ENCOUNTER — Ambulatory Visit: Payer: Medicare Other | Admitting: Physician Assistant

## 2020-09-24 ENCOUNTER — Ambulatory Visit (INDEPENDENT_AMBULATORY_CARE_PROVIDER_SITE_OTHER): Payer: Medicare Other | Admitting: Physician Assistant

## 2020-09-24 ENCOUNTER — Other Ambulatory Visit: Payer: Self-pay

## 2020-09-24 DIAGNOSIS — R8271 Bacteriuria: Secondary | ICD-10-CM

## 2020-09-24 DIAGNOSIS — C73 Malignant neoplasm of thyroid gland: Secondary | ICD-10-CM | POA: Diagnosis not present

## 2020-09-24 DIAGNOSIS — R339 Retention of urine, unspecified: Secondary | ICD-10-CM | POA: Diagnosis not present

## 2020-09-24 LAB — BLADDER SCAN AMB NON-IMAGING: Scan Result: 462 mL

## 2020-09-24 NOTE — Patient Instructions (Signed)
We will plan for monthly catheter changes in our clinic moving forward. Alternatively, palliative care may do these at your home.   Today we discussed the possibility of switching to a suprapubic catheter, which I believe would be much more comfortable because you would not be sitting on your catheter tubing all the time. If you would like to proceed with this, please contact our office and we will set you up with Interventional Radiology do have this performed.

## 2020-09-24 NOTE — Progress Notes (Signed)
Catheter Removal  Patient is present today for a catheter removal.  36ml of water was drained from the balloon. A 16 silicone FR foley cath was removed from the bladder no complications were noted . Patient tolerated well.  Performed by: Fonnie Jarvis, CMA

## 2020-09-24 NOTE — Progress Notes (Signed)
09/24/2020 12:49 PM   [name] Aug 26, 1925 [id number]  CC: Chief Complaint  Patient presents with  . Urinary Retention   HPI: [name] is a 86 y.o. female with a history of intermittent urinary retention who has refused chronic indwelling Foley catheter who presents today for outpatient voiding trial.  She presented to the [facility] ED on 09/12/2020 with reports of suprapubic pain, bladder fullness, and decreased urinary output.  Bladder scan with 337 mL.  UA positive for nitrites and many bacteria, however no pyuria or other signs of inflammation.  Regardless, she was started on fosfomycin for possible UTI.  She was discharged with Foley catheter in place.  Today, her daughter reports she believes her mother's emptying has worsened over the past several months after she underwent thyroid surgery.  Notably, patient is initiating care with palliative care and will likely be able to access in-home Foley exchanges in the near future  PMH: Past Medical History:  Diagnosis Date  . Allergy    environmental  . Anxiety   . Chronic back pain   . High cholesterol   . History of urinary retention   . Hypertension   . Migraines   . Osteoporosis   . Pneumonia   . Sinusitis     Surgical History: Past Surgical History:  Procedure Laterality Date  . ABDOMINAL HYSTERECTOMY    . APPENDECTOMY    . BACK SURGERY    . BACK SURGERY  2008   2009  . BREAST BIOPSY Right 2002   neg  . HEMORRHOID SURGERY    . MOUTH SURGERY  2019  . TONSILLECTOMY      Home Medications:  Allergies as of 09/24/2020      Reactions   Amoxicillin Itching, Swelling, Rash   Anaphylactoid reaction occurred around the time that the patient took both Amoxicillin and Cipro. ED notes state this reaction was to the Amoxicillin, patient's family reports it was to the Cipro.   Nitrofurantoin    swelling   Ceftin [cefuroxime]    Cephalosporins    Cortisone Other (See Comments)   Headache    Fluticasone Swelling   Facial   Levofloxacin Nausea And Vomiting   Meperidine Other (See Comments)   Morphine And Related Other (See Comments)   headache   Morphine Sulfate    Nisoldipine    Penicillin G Potassium In D5w    Prednisone Other (See Comments)   Headache   Sulfa Antibiotics Nausea Only   Cephalexin Swelling   Lip swelling with Keflex prescribed in January 2021. Family administered benadryl.   Ciprofloxacin Rash      Medication List       Accurate as of September 24, 2020 12:49 PM. If you have any questions, ask your nurse or doctor.        amLODipine 5 MG tablet Commonly known as: NORVASC Take 1 tablet (5 mg total) by mouth daily.   calcitRIOL 1 MCG/ML solution Commonly known as: ROCALTROL   carvedilol 6.25 MG tablet Commonly known as: COREG Take 1 tablet (6.25 mg total) by mouth 2 (two) times daily with a meal.   EPINEPHrine 0.3 mg/0.3 mL Soaj injection Commonly known as: EPI-PEN   FLUoxetine 20 MG capsule Commonly known as: PROZAC Take 1 capsule (20 mg total) by mouth daily.   furosemide 20 MG tablet Commonly known as: LASIX Take 1 tablet (20 mg total) by mouth daily.   HYDROcodone-acetaminophen 10-325 MG tablet Commonly known as: NORCO Take 1 tablet  by mouth every 4 (four) hours as needed for moderate pain. six tablets a day every four hours --per pain management   levothyroxine 25 MCG tablet Commonly known as: SYNTHROID   LORazepam 0.5 MG tablet Commonly known as: ATIVAN Take 1 tablet (0.5 mg total) by mouth 4 (four) times daily as needed for anxiety.   mirtazapine 30 MG tablet Commonly known as: REMERON Take 1 tablet (30 mg total) by mouth at bedtime.   tamsulosin 0.4 MG Caps capsule Commonly known as: FLOMAX Take 1 capsule (0.4 mg total) by mouth daily.       Allergies:  Allergies  Allergen Reactions  . Amoxicillin Itching, Swelling and Rash    Anaphylactoid reaction occurred around the time that the patient took both  Amoxicillin and Cipro. ED notes state this reaction was to the Amoxicillin, patient's family reports it was to the Cipro.  . Nitrofurantoin     swelling  . Ceftin [Cefuroxime]   . Cephalosporins   . Cortisone Other (See Comments)    Headache   . Fluticasone Swelling    Facial  . Levofloxacin Nausea And Vomiting  . Meperidine Other (See Comments)  . Morphine And Related Other (See Comments)    headache  . Morphine Sulfate   . Nisoldipine   . Penicillin G Potassium In D5w   . Prednisone Other (See Comments)    Headache   . Sulfa Antibiotics Nausea Only  . Cephalexin Swelling    Lip swelling with Keflex prescribed in January 2021. Family administered benadryl.  . Ciprofloxacin Rash    Family History: Family History  Problem Relation Age of Onset  . Breast cancer Sister 38  . Breast cancer Maternal Grandmother 59  . Breast cancer Sister 70  . Bladder Cancer Brother   . Hypertension Neg Hx   . Kidney cancer Neg Hx     Social History:   reports that she has never smoked. She has never used smokeless tobacco. She reports that she does not drink alcohol and does not use drugs.  Physical Exam: There were no vitals taken for this visit.  Constitutional:  Alert and oriented, no acute distress, nontoxic appearing HEENT: [place], AT Cardiovascular: No clubbing, cyanosis, or edema Respiratory: Normal respiratory effort, no increased work of breathing Skin: No rashes, bruises or suspicious lesions Neurologic: Grossly intact, no focal deficits, moving all 4 extremities Psychiatric: Normal mood and affect  Laboratory Data: Results for orders placed or performed in visit on 09/24/20  BLADDER SCAN AMB NON-IMAGING  Result Value Ref Range   Scan Result 462 mL   Assessment & Plan:   1. Urinary retention Foley catheter removed in the morning, see separate procedure note for details. Patient returned to clinic this afternoon for repeat PVR. She reports drinking approximately 32oz of  fluid. She has been able to urinate. PVR 483mL.  She reports lower abdominal pain and the urge to urinate.  Voiding trial failed.  I had a frank conversation with the patient and her daughter in clinic today.  I explained that her intermittent urinary retention is rapidly becoming a chronic process and that we are doing a disservice to her by deferring chronic indwelling Foley catheter.  I explained that this problem is not expected to resolve on its own and I strongly encourage them to consider chronic indwelling Foley catheter versus suprapubic catheter.  I suspect she will do particularly well with a suprapubic catheter, given she has historically had a lot of catheter discomfort with a urethral  Foley.  I clarified that his suprapubic catheter carries the same risk of urinary tract infection long-term as a urethral Foley, however it may offer an advantage in terms of patient comfort.  More immediately, I recommended Foley catheter replacement today.  Patient and daughter are agreement with Foley catheter replacement today, see separate procedure note for details.  We will plan for monthly Foley exchange in our clinic.  Daughter plans to have a further conversation with her family regarding possible SP tube placement and will contact her office to arrange this if desired.  Okay to proceed with monthly catheter exchanges at home with the palliative care team if desired. - BLADDER SCAN AMB NON-IMAGING  2. Bacteriuria As previously noted, patient is very likely colonized given her age and the absence of inflammatory markers on UA.  I do not recommend treating her with antibiotics in the absence of infective symptoms including dysuria, urgency, frequency, lower abdominal pain, low back pain, fever, chills, nausea, or vomiting.   Return in about 4 weeks (around 10/22/2020) for Catheter exchange.  Debroah Loop, PA-C  [facility] [address] [place], [place] [address] [phone]

## 2020-09-24 NOTE — Progress Notes (Signed)
Simple Catheter Placement  Due to urinary retention patient is present today for a foley cath placement.  Patient was cleaned and prepped in a sterile fashion with betadine. A 16 FR foley catheter was inserted, urine return was noted  43ml, urine was yellow in color.  The balloon was filled with 10cc of sterile water.  A night bag was attached for drainage. Patient was given instruction on proper catheter care.  Patient tolerated well, no complications were noted   Performed by: Debroah Loop, PA-C and Fonnie Jarvis, CMA

## 2020-09-26 ENCOUNTER — Other Ambulatory Visit: Payer: Self-pay

## 2020-09-26 ENCOUNTER — Other Ambulatory Visit: Payer: Medicare Other

## 2020-09-26 VITALS — BP 122/74 | HR 80 | Temp 98.4°F

## 2020-09-26 DIAGNOSIS — C73 Malignant neoplasm of thyroid gland: Secondary | ICD-10-CM | POA: Diagnosis not present

## 2020-09-26 DIAGNOSIS — E890 Postprocedural hypothyroidism: Secondary | ICD-10-CM | POA: Diagnosis not present

## 2020-09-26 DIAGNOSIS — Z515 Encounter for palliative care: Secondary | ICD-10-CM

## 2020-09-26 NOTE — Progress Notes (Signed)
PATIENT NAME: [name] DOB: [birth year] MRN: [id number]  PRIMARY CARE PROVIDER: Olin Hauser, DO  RESPONSIBLE PARTY:  Acct ID - Guarantor Home Phone Work Phone Relationship Acct Type  [id number] [name], WINEGAR* [phone]  Self P/F     [address], [place], [place] [address]    PLAN OF CARE and INTERVENTIONS:               1.  GOALS OF CARE/ ADVANCE CARE PLANNING:  Remain in the home with assistance of daughters and private caregivers.               2.  PATIENT/CAREGIVER EDUCATION:  Palliative Care vs Hospice Services               4. PERSONAL EMERGENCY PLAN:  Family will activate 911 for emergencies.               5.  DISEASE STATUS: Joint visit with [place], [place].  Greeted at door by [employer].   Patient is found in the kitchen asking for her pain medication.  She has ongoing back pain due to a surgery several years ago.  Patient is taking Norco about every 4 hours.  Constipation is an ongoing issue.  Colace is being used as needed.  Patient remains continent of bowel.  Patient has an indwelling foley cath in place.  She is scheduled for a suprapubic cath placement.  Urine is clear yellow in appearance.  Patient is able to dress and feed herself.  Daughter reports assistance is provided.  She is using a rolling walker to safely ambulate in the home.  Patient is very hard of hearing and has macular degeneration.  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  Palliative Care vs Hospice Care explained to daughter.     HISTORY OF PRESENT ILLNESS:  86 year old female with a hx of urinary retention and HTN.  Patient is being followed by palliative care monthly and prn.  CODE STATUS: Full ADVANCED DIRECTIVES: No MOST FORM: No PPS: 50%   PHYSICAL EXAM:   VITALS:  Temp 98.4 F BP 122/74 P 80 R 20 O2 sats 95% LUNGS: CTA CARDIAC: HRR EXTREMITIES: No edema SKIN: warm and dry to touch.  No skin  breakdown reported. NEURO: + hard of hearing, alert and oriented x 3       Lorenza Burton, RN

## 2020-09-26 NOTE — Progress Notes (Signed)
COMMUNITY PALLIATIVE CARE SW NOTE  PATIENT NAME: [name] DOB: [birth year] MRN: [id number]  PRIMARY CARE PROVIDER: Olin Hauser, DO  RESPONSIBLE PARTY:  Acct ID - Guarantor Home Phone Work Phone Relationship Acct Type  [id number] [name]* [phone]  Self P/F     [address], [place], [place] [address]     PLAN OF CARE and INTERVENTIONS:             1. GOALS OF CARE/ ADVANCE CARE PLANNING:  Patient is a full code with full scope of practice and all measures to be attempted. Daughters are POA. Patient's goal is to remain at home as independent as possible with family support. 2.         SOCIAL/EMOTIONAL/SPIRITUAL ASSESSMENT/ INTERVENTIONS:  SW and RN [name] met with patient in patients home for initial visit. Patient lives in a one story home with daughter, [name]. Daughter is primay caregiver, patient also has private caregivers during the week to assist with ADL's. Patient was up walking in kitchen requesting something for pain upon palliative care entrance to the home. Patient is very [name] and has macular degeneration, which prevents her from seeing as well. [name] informed SW and RN of patient medical condition. Patient has hx of thyroid ca and urinary incontinence with catheter placement along with other medical conditions. Patient has appointment with oncology today to discuss treatment for mass on/in throat area. Daughter is reports that patient is constantly in pain from a previous back surgery that resulted in nerve damage. Daughter reports that patient does nt eat much and has a minor weight loss this year. Patient does not eat much (oatmeal, vanilla pudding and something small for dinner possibly). Patient sleeps well. Daughter checks BP a couple time s a day and has not had to give patient her BP medication. RN checked vitals and reviewed medications.  Patient SW discussed and reviewed palliative care services versus Hospice services. SW discussed goals,  reviewed care plan. Palliative care will continue to monitor and assist with long term care planning as needed and possibly revisit Hospice topic after patients cancer treatment is complete.  3.         PATIENT/CAREGIVER EDUCATION/ COPING:  Patient A&O able to answer questions appropriately. Patient is very [place]. Daughter shares that patient does exhibit signs of depression and is taking an antidepressant. Patient used to enjoy sitting in front of the house and look out the windows, however over the years patient has become more secluded to the bedroom and sits and looks out her bedroom window. Patient has 2 daughters, one of which resides with her and another that lives one mil away, both are supportive.  4.         PERSONAL EMERGENCY PLAN:  Family will call 9-1-1 for emergencies. Neighbor is supportive and able to offer support when needed. 5.         COMMUNITY RESOURCES COORDINATION/ HEALTH CARE NAVIGATION:  patients daughter manages her care. Patient has private caregivers 4 days a week 8am-5pm. 6.         FINANCIAL/LEGAL CONCERNS/INTERVENTIONS:  None.     SOCIAL HX:  Social History   Tobacco Use  . Smoking status: Never Smoker  . Smokeless tobacco: Never Used  Substance Use Topics  . Alcohol use: No    CODE STATUS: Full code ADVANCED DIRECTIVES: Y MOST FORM COMPLETE: N HOSPICE EDUCATION PROVIDED: Y  PPS: Patient is ambulatory with RW. Patient uses [facility] for toileting needs. Patient requires assistance  with ADL's, able to dress self but requires assistance.   Time spent: 35 min      [place], [name]

## 2020-09-29 DIAGNOSIS — M961 Postlaminectomy syndrome, not elsewhere classified: Secondary | ICD-10-CM | POA: Diagnosis not present

## 2020-09-30 ENCOUNTER — Ambulatory Visit: Payer: Self-pay | Admitting: Urology

## 2020-10-07 ENCOUNTER — Other Ambulatory Visit: Payer: Self-pay | Admitting: Family Medicine

## 2020-10-07 DIAGNOSIS — F411 Generalized anxiety disorder: Secondary | ICD-10-CM

## 2020-10-07 DIAGNOSIS — Z515 Encounter for palliative care: Secondary | ICD-10-CM

## 2020-10-07 NOTE — Telephone Encounter (Signed)
Requested medication (s) are due for refill today: yes  Requested medication (s) are on the active medication list: yes  Last refill:  06/27/20  #120  2 refills  Future visit scheduled: No  Notes to clinic:  Not delegated    Requested Prescriptions  Pending Prescriptions Disp Refills   LORazepam (ATIVAN) 0.5 MG tablet [Pharmacy Med Name: LORAZEPAM 0.5 MG TABLET] 120 tablet 0    Sig: Take 1 tablet (0.5 mg total) by mouth 4 (four) times daily as needed for anxiety.      Not Delegated - Psychiatry:  Anxiolytics/Hypnotics Failed - 10/07/2020 12:40 PM      Failed - This refill cannot be delegated      Failed - Urine Drug Screen completed in last 360 days      Passed - Valid encounter within last 6 months    Recent Outpatient Visits           3 months ago Generalized anxiety disorder   [facility] Smitty Cords, DO   11 months ago Acute left otitis media   [facility] [place], Netta Neat, DO   1 year ago Acute cystitis without hematuria   [facility] Trey Sailors, [place]   1 year ago Dysuria   [facility] Smitty Cords, DO   1 year ago Dysuria   [facility] Kyung Rudd, Alison Stalling, NP       Future Appointments             In 3 weeks Carman Ching, Cordelia Poche [facility]

## 2020-10-09 ENCOUNTER — Other Ambulatory Visit: Payer: Self-pay

## 2020-10-09 ENCOUNTER — Telehealth: Payer: Self-pay

## 2020-10-09 ENCOUNTER — Telehealth (INDEPENDENT_AMBULATORY_CARE_PROVIDER_SITE_OTHER): Payer: Medicare Other | Admitting: Family Medicine

## 2020-10-09 ENCOUNTER — Encounter: Payer: Self-pay | Admitting: Family Medicine

## 2020-10-09 DIAGNOSIS — J1282 Pneumonia due to coronavirus disease 2019: Secondary | ICD-10-CM | POA: Diagnosis not present

## 2020-10-09 DIAGNOSIS — U071 COVID-19: Secondary | ICD-10-CM | POA: Diagnosis not present

## 2020-10-09 MED ORDER — DOXYCYCLINE HYCLATE 100 MG PO TABS
100.0000 mg | ORAL_TABLET | Freq: Two times a day (BID) | ORAL | 0 refills | Status: DC
Start: 2020-10-09 — End: 2021-08-06

## 2020-10-09 NOTE — Telephone Encounter (Signed)
The pt was scheduled for a virtual visit today at 10:40am to discuss treatment plans.

## 2020-10-09 NOTE — Progress Notes (Signed)
Virtual Visit via Telephone The purpose of this virtual visit is to provide medical care while limiting exposure to the novel coronavirus (COVID19) for both patient and office staff.  Consent was obtained for phone visit:  Yes.   Answered questions that patient had about telehealth interaction:  Yes.   I discussed the limitations, risks, security and privacy concerns of performing an evaluation and management service by telephone. I also discussed with the patient that there may be a patient responsible charge related to this service. The patient expressed understanding and agreed to proceed.  Patient Location: Home Provider Location: [facility] (Office)  Participants in virtual visit: - Daughter/Caregiver: [name] (on behalf of patient [name]) - CMA: Elvina Mattes, CMA - Provider: Dr Althea Charon  ---------------------------------------------------------------------- Chief Complaint  Patient presents with  . Covid Positive    Caregiver diagnosed positive despite all the vaccine --patient had done home tested which came back positive and pt has Hx of CA so concerned for positive test--cough went away no sinus or allergic Sxs    S: Reviewed CMA documentation. I have called patient and gathered additional HPI as follows:  COVID Infection Initial exposure to COVID from sick caregiver who was in their home last week, on Weds 12/22 and since that time the caregiver tested positive for covid (that person was vaccinated against covid), and the patient began exhibiting her symptoms last week on Thurs 12/23 had some coughing initially. Both the patient and her daughter [name] did a home self test for covid, and were positive.  Patient overall has malaise and not feeling well. She has persistent headache that was present PRIOR To onset of covid exposure, related to her thyroid by report today. Recently thyroid medication was held due to upcoming thyroid procedure  with her thyroid cancer.  Patient is not endorsing significant cough or shortness of breath or wheezing or chest pain.  She has extensive allergy list including Prednisone and most antibiotics. She has taken Doxycycline before.  Patient is currently in insolation  Denies any fevers, chills, sweats, body ache, cough, shortness of breath, sinus pain or pressure,  abdominal pain, diarrhea  Past Medical History:  Diagnosis Date  . Allergy    environmental  . Anxiety   . Chronic back pain   . High cholesterol   . History of urinary retention   . Hypertension   . Migraines   . Osteoporosis   . Pneumonia   . Sinusitis    Social History   Tobacco Use  . Smoking status: Never Smoker  . Smokeless tobacco: Never Used  Vaping Use  . Vaping Use: Never used  Substance Use Topics  . Alcohol use: No  . Drug use: No    Current Outpatient Medications:  .  amLODipine (NORVASC) 5 MG tablet, Take 1 tablet (5 mg total) by mouth daily., Disp: 90 tablet, Rfl: 1 .  calcitRIOL (ROCALTROL) 1 MCG/ML solution, , Disp: , Rfl:  .  carvedilol (COREG) 6.25 MG tablet, Take 1 tablet (6.25 mg total) by mouth 2 (two) times daily with a meal., Disp: 180 tablet, Rfl: 1 .  doxycycline (VIBRA-TABS) 100 MG tablet, Take 1 tablet (100 mg total) by mouth 2 (two) times daily. For 10 days. Take with full glass of water, stay upright 30 min after taking., Disp: 20 tablet, Rfl: 0 .  EPINEPHrine 0.3 mg/0.3 mL IJ SOAJ injection, , Disp: , Rfl:  .  FLUoxetine (PROZAC) 20 MG capsule, Take 1 capsule (20 mg  total) by mouth daily., Disp: 90 capsule, Rfl: 1 .  furosemide (LASIX) 20 MG tablet, Take 1 tablet (20 mg total) by mouth daily., Disp: 90 tablet, Rfl: 1 .  HYDROcodone-acetaminophen (NORCO) 10-325 MG tablet, Take 1 tablet by mouth every 4 (four) hours as needed for moderate pain. six tablets a day every four hours --per pain management, Disp: , Rfl:  .  levothyroxine (SYNTHROID) 25 MCG tablet, , Disp: , Rfl:  .   LORazepam (ATIVAN) 0.5 MG tablet, Take 1 tablet (0.5 mg total) by mouth 4 (four) times daily as needed for anxiety., Disp: 120 tablet, Rfl: 2 .  mirtazapine (REMERON) 30 MG tablet, Take 1 tablet (30 mg total) by mouth at bedtime., Disp: 90 tablet, Rfl: 1 .  tamsulosin (FLOMAX) 0.4 MG CAPS capsule, Take 1 capsule (0.4 mg total) by mouth daily., Disp: 30 capsule, Rfl: 11  Depression screen [facility] 2/9 06/27/2020 04/06/2019 01/19/2019  Decreased Interest 2 0 0  Down, Depressed, Hopeless 0 0 0  PHQ - 2 Score 2 0 0  Altered sleeping 0 0 0  Tired, decreased energy 2 0 1  Change in appetite 2 0 0  Feeling bad or failure about yourself  0 0 0  Trouble concentrating 0 0 0  Moving slowly or fidgety/restless 0 0 0  Suicidal thoughts 0 0 0  PHQ-9 Score 6 0 1  Difficult doing work/chores Not difficult at all Not difficult at all Not difficult at all  Some recent data might be hidden    GAD 7 : Generalized Anxiety Score 06/27/2020 06/22/2019 02/15/2019 01/19/2019  Nervous, Anxious, on Edge 0 (No Data) (No Data) 0  Control/stop worrying 1 - - 0  Worry too much - different things 1 - - 0  Trouble relaxing 1 - - 0  Restless 0 - - 0  Easily annoyed or irritable 0 - - 0  Afraid - awful might happen 2 - - 0  Total GAD 7 Score 5 - - 0  Anxiety Difficulty Not difficult at all - - Not difficult at all    -------------------------------------------------------------------------- O: No physical exam performed due to remote telephone encounter.  Lab results reviewed.  Recent Results (from the past 2160 hour(s))  Urinalysis, Complete     Status: None   Collection Time: 08/21/20  2:29 PM  Result Value Ref Range   Specific Gravity, UA 1.015 1.005 - 1.030   pH, UA 7.5 5.0 - 7.5   Color, UA Yellow Yellow   Appearance Ur Clear Clear   Leukocytes,UA Negative Negative   Protein,UA Negative Negative/Trace   Glucose, UA Negative Negative   Ketones, UA Negative Negative   RBC, UA Negative Negative   Bilirubin, UA  Negative Negative   Urobilinogen, Ur 0.2 0.2 - 1.0 mg/dL   Nitrite, UA Negative Negative   Microscopic Examination See below:   CULTURE, URINE COMPREHENSIVE     Status: Abnormal   Collection Time: 08/21/20  2:29 PM   Specimen: Urine   UR  Result Value Ref Range   Urine Culture, Comprehensive Final report (A)    Organism ID, Bacteria Escherichia coli (A)     Comment: Cefazolin <=4 ug/mL Cefazolin with an MIC <=16 predicts susceptibility to the oral agents cefaclor, cefdinir, cefpodoxime, cefprozil, cefuroxime, cephalexin, and loracarbef when used for therapy of uncomplicated urinary tract infections due to E. coli, Klebsiella pneumoniae, and Proteus mirabilis. 1,000 Colonies/mL    ANTIMICROBIAL SUSCEPTIBILITY Comment     Comment:       **  S = Susceptible; I = Intermediate; R = Resistant **                    P = Positive; N = Negative             MICS are expressed in micrograms per mL    Antibiotic                 RSLT#1    RSLT#2    RSLT#3    RSLT#4 Amoxicillin/Clavulanic Acid    S Ampicillin                     R Cefepime                       S Ceftriaxone                    S Cefuroxime                     S Ciprofloxacin                  R Ertapenem                      S Gentamicin                     S Imipenem                       S Levofloxacin                   R Meropenem                      S Nitrofurantoin                 S Piperacillin/Tazobactam        S Tetracycline                   S Tobramycin                     S Trimethoprim/Sulfa             S   Microscopic Examination     Status: None   Collection Time: 08/21/20  2:29 PM   Urine  Result Value Ref Range   WBC, UA 0-5 0 - 5 /hpf   RBC 0-2 0 - 2 /hpf   Epithelial Cells (non renal) None seen 0 - 10 /hpf   Bacteria, UA Few None seen/Few  BLADDER SCAN AMB NON-IMAGING     Status: None   Collection Time: 08/21/20  2:42 PM  Result Value Ref Range   Scan Result 417 ml   BLADDER SCAN AMB NON-IMAGING      Status: None   Collection Time: 08/28/20  2:49 PM  Result Value Ref Range   Scan Result 89 ml   BLADDER SCAN AMB NON-IMAGING     Status: None   Collection Time: 09/24/20  3:17 PM  Result Value Ref Range   Scan Result 462 mL    -------------------------------------------------------------------------- A&P:  Problem List Items Addressed This Visit   None   Visit Diagnoses    COVID-19 virus infection    -  Primary   Pneumonia due to COVID-19 virus       Relevant Medications   doxycycline (  VIBRA-TABS) 100 MG tablet     Clinically with COVID19 infection, exposure 12/22, symptom onset 12/23, tested positive today 10/09/20 (resulted, initial test done 12/28). Caregiver, daughter [name] also at home sick with covid both exposed at same time.  Notable improvement or mild case with regards to COVID and her respiratory symptoms, none currently that are affecting her.  She has history of palliative care, no longer actively following.  She has papillary cancer of thyroid currently under going treatment  She has emphysema, and is age 86 with other medical comorbid factors, she is high risk of complication.  Unable to get X-ray at this time, offered antibiotic empirically to cover for potential covid pneumonia complication  Cannot use Prednisone due to allergy.  Considered IV antibody infusion, declined at this time.  Start taking Doxycycline antibiotic 100mg  twice daily for 10 days. Take with full glass of water and stay upright for at least 30 min after taking, may be seated or standing, but should NOT lay down. This is just a safety precaution, if this medicine does not go all the way down throat well it could cause some burning discomfort to throat and esophagus.  Follow-up if not improved, strict return criteria given with when to seek care sooner at hospital ED or urgent care, and contact us if needed.   Meds ordered this encounter  Medications  . doxycycline (VIBRA-TABS) 100  MG tablet    Sig: Take 1 tablet (100 mg total) by mouth 2 (two) times daily. For 10 days. Take with full glass of water, stay upright 30 min after taking.    Dispense:  20 tablet    Refill:  0    Follow-up: - Return in 1 week as needed virtually for covid  Patient verbalizes understanding with the above medical recommendations including the limitation of remote medical advice.  Specific follow-up and call-back criteria were given for patient to follow-up or seek medical care more urgently if needed.   - Time spent in direct consultation with patient on phone: 9 minutes   [name], [facility] 10/09/2020, 10:03 AM

## 2020-10-09 NOTE — Telephone Encounter (Signed)
Copied from CRM [id number]. Topic: General - Other >> Oct 09, 2020  9:28 AM Jaquita Rector A wrote: Reason for CRM: Patient daughter [name] called in to inquire of Dr Kirtland Bouchard what to be done say that patient was diagnosed with Covid through a home test due to the caretaker coming into the home and later tested positive. Per [name] patient is coughing, weak and complains of headache say she don't seem to be getting any better. Please advise  Ph# [phone]

## 2020-10-21 ENCOUNTER — Ambulatory Visit: Payer: Medicare Other | Admitting: Licensed Clinical Social Worker

## 2020-10-21 ENCOUNTER — Telehealth: Payer: Self-pay

## 2020-10-21 DIAGNOSIS — I10 Essential (primary) hypertension: Secondary | ICD-10-CM

## 2020-10-21 DIAGNOSIS — F411 Generalized anxiety disorder: Secondary | ICD-10-CM

## 2020-10-21 DIAGNOSIS — U071 COVID-19: Secondary | ICD-10-CM

## 2020-10-21 DIAGNOSIS — R413 Other amnesia: Secondary | ICD-10-CM

## 2020-10-21 NOTE — Telephone Encounter (Signed)
349 pm.  Telephone call made to patient's daughter [name] to complete a telephonic visit.  Unable to leave a message on cell phone.  Phone call to home phone but no answer.  PLAN:  I will call back at a later date.

## 2020-10-21 NOTE — Chronic Care Management (AMB) (Addendum)
Chronic Care Management    Clinical Social Work Note  10/21/2020 Name: [name] MRN: [id number] DOB: Oct 25, 1924  [name] is a 87 y.o. year old female who is a primary care patient of Olin Hauser, DO. The CCM team was consulted to assist the patient with chronic disease management and/or care coordination needs related to: Level of Care Concerns.   Engaged with patient by telephone for follow up visit in response to provider referral for social work chronic care management and care coordination services.   Consent to Services:  The patient was given the following information about Chronic Care Management services today, agreed to services, and gave verbal consent: 1. CCM service includes personalized support from designated clinical staff supervised by the primary care provider, including individualized plan of care and coordination with other care providers 2. 24/7 contact phone numbers for assistance for urgent and routine care needs. 3. Service will only be billed when office clinical staff spend 20 minutes or more in a month to coordinate care. 4. Only one practitioner may furnish and bill the service in a calendar month. 5.The patient may stop CCM services at any time (effective at the end of the month) by phone call to the office staff. 6. The patient will be responsible for cost sharing (co-pay) of up to 20% of the service fee (after annual deductible is met). Patient agreed to services and consent obtained.  Patient agreed to services and consent obtained.   Assessment/Interventions: Review of patient past medical history, allergies, medications, and health status, including review of relevant consultants reports was performed today as part of a comprehensive evaluation and provision of chronic care management and care coordination services.     SDOH (Social Determinants of Health) assessments and interventions performed:    Advanced Directives Status: See  Care Plan for related entries.  CCM Care Plan  Allergies  Allergen Reactions  . Amoxicillin Itching, Swelling and Rash    Anaphylactoid reaction occurred around the time that the patient took both Amoxicillin and Cipro. ED notes state this reaction was to the Amoxicillin, patient's family reports it was to the Cipro.  . Nitrofurantoin     swelling  . Ceftin [Cefuroxime]   . Cephalosporins   . Cortisone Other (See Comments)    Headache   . Fluticasone Swelling    Facial  . Levofloxacin Nausea And Vomiting  . Meperidine Other (See Comments)  . Morphine And Related Other (See Comments)    headache  . Morphine Sulfate   . Nisoldipine   . Penicillin G Potassium In D5w   . Prednisone Other (See Comments)    Headache   . Sulfa Antibiotics Nausea Only  . Cephalexin Swelling    Lip swelling with Keflex prescribed in January 2021. Family administered benadryl.  . Ciprofloxacin Rash    Outpatient Encounter Medications as of 10/21/2020  Medication Sig Note  . amLODipine (NORVASC) 5 MG tablet Take 1 tablet (5 mg total) by mouth daily.   . calcitRIOL (ROCALTROL) 1 MCG/ML solution    . carvedilol (COREG) 6.25 MG tablet Take 1 tablet (6.25 mg total) by mouth 2 (two) times daily with a meal.   . doxycycline (VIBRA-TABS) 100 MG tablet Take 1 tablet (100 mg total) by mouth 2 (two) times daily. For 10 days. Take with full glass of water, stay upright 30 min after taking.   [name] EPINEPHrine 0.3 mg/0.3 mL IJ SOAJ injection    . FLUoxetine (PROZAC) 20 MG capsule Take  1 capsule (20 mg total) by mouth daily.   . furosemide (LASIX) 20 MG tablet Take 1 tablet (20 mg total) by mouth daily.   [name] HYDROcodone-acetaminophen (NORCO) 10-325 MG tablet Take 1 tablet by mouth every 4 (four) hours as needed for moderate pain. six tablets a day every four hours --per pain management 07/29/2018: Takes fairly regularly   . levothyroxine (SYNTHROID) 25 MCG tablet    . LORazepam (ATIVAN) 0.5 MG tablet Take 1 tablet (0.5  mg total) by mouth 4 (four) times daily as needed for anxiety.   . mirtazapine (REMERON) 30 MG tablet Take 1 tablet (30 mg total) by mouth at bedtime.   . tamsulosin (FLOMAX) 0.4 MG CAPS capsule Take 1 capsule (0.4 mg total) by mouth daily.    No facility-administered encounter medications on file as of 10/21/2020.    Patient Active Problem List   Diagnosis Date Noted  . Multinodular goiter 03/18/2020  . Papillary thyroid carcinoma ([place]) 03/18/2020  . Macular degeneration 09/04/2018  . Cervical lymphadenopathy 07/18/2018  . Pain in right hip 07/18/2018  . Lymphadenopathy 06/25/2018  . Stomatitis and mucositis 06/25/2018  . Shortness of breath 02/16/2018  . Arm fracture, left 01/03/2018  . Arthritis 01/03/2018  . Compression fracture of thoracic vertebra ([place]) 01/03/2018  . Emphysema lung ([place]) 01/03/2018  . GERD (gastroesophageal reflux disease) 01/03/2018  . GI bleeding 01/03/2018  . Lumbar spondylosis 01/03/2018  . Menstrual problem 01/03/2018  . Osteoarthritis of knee 01/03/2018  . Osteoporosis 01/03/2018  . Acute pain of right knee 01/03/2018  . Synovial cyst of popliteal space 01/03/2018  . History of urinary retention 11/10/2017  . Generalized anxiety disorder 10/24/2017  . Constipation 10/24/2017  . Internal hemorrhoids 10/24/2017  . Essential (primary) hypertension 10/24/2017  . Thrombocytopenia ([place]) 10/24/2017  . Acute encephalopathy 08/04/2017  . Hyponatremia 12/15/2015    Conditions to be addressed/monitored: Anxiety and Dementia; Limited social support, ADL IADL limitations, Mental Health Concerns , Social Isolation and Memory Deficits  Patient Care Plan: General Social Work (Adult)    Problem Identified: Coping Skills (General Plan of Care)     Goal: Coping Skills Enhanced   Start Date: 10/21/2020  Note:   Evidence-based guidance:   Acknowledge, normalize and validate difficulty of making life-long lifestyle changes.   Identify current effective and  ineffective coping strategies.   Encourage patient and caregiver participation in care to increase self-esteem, confidence and feelings of control.   Consider alternative and complementary therapy approaches such as meditation, mindfulness or yoga.   Encourage participation in cognitive behavioral therapy to foster a positive identity, increase self-awareness, as well as bolster self-esteem, confidence and self-efficacy.   Discuss spirituality; be present as concerns are identified; encourage journaling, prayer, worship services, meditation or pastoral counseling.   Encourage participation in pleasurable group activities such as hobbies, singing, sports or volunteering).   Encourage the use of mindfulness; refer for training or intensive intervention.   Consider the use of meditative movement therapy such as tai chi, yoga or qigong.   Promote a regular daily exercise program based on tolerance, ability and patient choice to support positive thinking about disease or aging.   Notes:   Current Barriers:  [name] Knowledge Deficits related to community resources to assist with patient's care . Lacks caregiver support.  . Film/video editor.  . Cognitive Deficits . No Advanced Directives in place . Ongoing depression/anxiety  Licensed Clinical Social Worker Goal(s):  [name] Over the next 90 days, patient will work with LCSW to  address needs related to Caregiver strain/mental health management   Interventions: ALL INTERVENTIONS DISCUSSED WITH DAUGHTER  . Evaluation of current treatment plan related to HTN, GAD, Chronic pain, UTIs and patient's adherence to plan as established by provider. . Discussed plans with patient for ongoing care management follow up and provided patient with direct contact information for care management team . CCM RNCM referral for follow up with patient about her health care/recent allergic reactions/unknown causes  . Talked with daughters [name] and Rosemarie Ax in the past  at length concerning the patient's care. She expresses the great exhaustion both daughters are experiencing caring for their mother and trying to continue working. However, things have improved since hiring new caregiver.  . Family report patient's mood has "somewhat improved" since socialization gained with new caregiver as well. Patient's anxiety has decreased with the help of additional medication assistance which is what daughter was requesting.  . Discussed the benefit of Palliative care to have NP come to the patient's home to evaluate patient and assist with symptom management.  [name] UPDATE- [name] reports that patient was recently diagnosed with thyroid cancer and will need to have surgery. Surgery will be scheduled soon per family.  . Patient and mother both had COVID over the holidays after being exposed to it by patient's caregiver.  . Family report stable transportation to upcoming medical appointments.  Patient Self Care Activities:  . Calls provider office for new concerns or questions inappropriately at times  . Unable to independently care for self . Does not attend all scheduled provider appointments . Unable to perform IADLs independently . Vision impairment . Hearing impairment  Please see past updates related to this goal by clicking on the "Past Updates" button in the selected goal       Task: Support Psychosocial Response to Risk or Actual Health Condition   Note:   Care Management Activities:    - active listening utilized - caregiver stress acknowledged - counseling provided - current coping strategies identified - decision-making supported - healthy lifestyle promoted - journaling promoted - meditative movement therapy encouraged - mindfulness encouraged - participation in counseling encouraged - problem-solving facilitated - relaxation techniques promoted - self-reflection promoted - spiritual activities promoted - verbalization of feelings encouraged     Notes:       Follow Up Plan: SW will follow up with patient by phone over the next quarter      Eula Fried, [place], MSW, [email or web address] Phone: [phone]

## 2020-10-28 ENCOUNTER — Ambulatory Visit (INDEPENDENT_AMBULATORY_CARE_PROVIDER_SITE_OTHER): Payer: Medicare Other | Admitting: Physician Assistant

## 2020-10-28 ENCOUNTER — Other Ambulatory Visit: Payer: Self-pay

## 2020-10-28 DIAGNOSIS — R339 Retention of urine, unspecified: Secondary | ICD-10-CM

## 2020-10-28 NOTE — Progress Notes (Signed)
Cath Change/ Replacement  Patient is present today for a catheter change due to urinary retention.  30ml of water was removed from the balloon, a 16FR foley cath was removed with out difficulty.  Patient was cleaned and prepped in a sterile fashion with betadine. A 16 FR foley cath was replaced into the bladder no complications were noted Urine return was noted 28ml and urine was pale yellow in color. The balloon was filled with 77ml of sterile water. A night bag was attached for drainage.   Performed by: Debroah Loop, PA-C   Additional notes: Patient and daughter would like to proceed with SPT placement at this time. We will contact IR to arrange and contact patient to schedule.  Follow up: Return for Will call to schedule suprapubic catheter placement with IR.

## 2020-10-29 DIAGNOSIS — C73 Malignant neoplasm of thyroid gland: Secondary | ICD-10-CM | POA: Diagnosis not present

## 2020-10-29 DIAGNOSIS — J340 Abscess, furuncle and carbuncle of nose: Secondary | ICD-10-CM | POA: Diagnosis not present

## 2020-10-31 ENCOUNTER — Other Ambulatory Visit: Payer: Self-pay | Admitting: Radiology

## 2020-10-31 DIAGNOSIS — R339 Retention of urine, unspecified: Secondary | ICD-10-CM

## 2020-11-10 ENCOUNTER — Other Ambulatory Visit: Payer: Self-pay | Admitting: Physician Assistant

## 2020-11-10 DIAGNOSIS — R339 Retention of urine, unspecified: Secondary | ICD-10-CM

## 2020-11-11 ENCOUNTER — Ambulatory Visit (INDEPENDENT_AMBULATORY_CARE_PROVIDER_SITE_OTHER): Payer: Medicare Other | Admitting: Licensed Clinical Social Worker

## 2020-11-11 DIAGNOSIS — F411 Generalized anxiety disorder: Secondary | ICD-10-CM

## 2020-11-11 DIAGNOSIS — I10 Essential (primary) hypertension: Secondary | ICD-10-CM | POA: Diagnosis not present

## 2020-11-11 DIAGNOSIS — M1710 Unilateral primary osteoarthritis, unspecified knee: Secondary | ICD-10-CM

## 2020-11-11 DIAGNOSIS — R413 Other amnesia: Secondary | ICD-10-CM

## 2020-11-11 DIAGNOSIS — M179 Osteoarthritis of knee, unspecified: Secondary | ICD-10-CM

## 2020-11-11 DIAGNOSIS — C73 Malignant neoplasm of thyroid gland: Secondary | ICD-10-CM

## 2020-11-11 NOTE — Chronic Care Management (AMB) (Signed)
Chronic Care Management    Clinical Social Work Note  11/11/2020 Name: REGAN MCBRYAR MRN: [id number] DOB: 28-Feb-1925  [name] is a 87 y.o. year old female who is a primary care patient of Olin Hauser, DO. The CCM team was consulted to assist the patient with chronic disease management and/or care coordination needs related to: Level of Care Concerns.   Engaged with patient by telephone for follow up visit in response to provider referral for social work chronic care management and care coordination services.   Consent to Services:  The patient was given the following information about Chronic Care Management services today, agreed to services, and gave verbal consent: 1. CCM service includes personalized support from designated clinical staff supervised by the primary care provider, including individualized plan of care and coordination with other care providers 2. 24/7 contact phone numbers for assistance for urgent and routine care needs. 3. Service will only be billed when office clinical staff spend 20 minutes or more in a month to coordinate care. 4. Only one practitioner may furnish and bill the service in a calendar month. 5.The patient may stop CCM services at any time (effective at the end of the month) by phone call to the office staff. 6. The patient will be responsible for cost sharing (co-pay) of up to 20% of the service fee (after annual deductible is met). Patient agreed to services and consent obtained.  Patient agreed to services and consent obtained.   Assessment: Review of patient past medical history, allergies, medications, and health status, including review of relevant consultants reports was performed today as part of a comprehensive evaluation and provision of chronic care management and care coordination services.     SDOH (Social Determinants of Health) assessments and interventions performed:    Advanced Directives Status: See Care Plan for  related entries.  CCM Care Plan  Allergies  Allergen Reactions  . Amoxicillin Itching, Swelling and Rash    Anaphylactoid reaction occurred around the time that the patient took both Amoxicillin and Cipro. ED notes state this reaction was to the Amoxicillin, patient's family reports it was to the Cipro.  . Nitrofurantoin     swelling  . Ceftin [Cefuroxime]   . Cephalosporins   . Cortisone Other (See Comments)    Headache   . Fluticasone Swelling    Facial  . Levofloxacin Nausea And Vomiting  . Meperidine Other (See Comments)  . Morphine And Related Other (See Comments)    headache  . Morphine Sulfate   . Nisoldipine   . Penicillin G Potassium In D5w   . Prednisone Other (See Comments)    Headache   . Sulfa Antibiotics Nausea Only  . Cephalexin Swelling    Lip swelling with Keflex prescribed in January 2021. Family administered benadryl.  . Ciprofloxacin Rash    Outpatient Encounter Medications as of 11/11/2020  Medication Sig Note  . amLODipine (NORVASC) 5 MG tablet Take 1 tablet (5 mg total) by mouth daily.   . calcitRIOL (ROCALTROL) 1 MCG/ML solution    . carvedilol (COREG) 6.25 MG tablet Take 1 tablet (6.25 mg total) by mouth 2 (two) times daily with a meal.   . doxycycline (VIBRA-TABS) 100 MG tablet Take 1 tablet (100 mg total) by mouth 2 (two) times daily. For 10 days. Take with full glass of water, stay upright 30 min after taking.   [name] EPINEPHrine 0.3 mg/0.3 mL IJ SOAJ injection    . FLUoxetine (PROZAC) 20 MG capsule Take  1 capsule (20 mg total) by mouth daily.   . furosemide (LASIX) 20 MG tablet Take 1 tablet (20 mg total) by mouth daily.   [name] HYDROcodone-acetaminophen (NORCO) 10-325 MG tablet Take 1 tablet by mouth every 4 (four) hours as needed for moderate pain. six tablets a day every four hours --per pain management 07/29/2018: Takes fairly regularly   . levothyroxine (SYNTHROID) 25 MCG tablet    . LORazepam (ATIVAN) 0.5 MG tablet Take 1 tablet (0.5 mg total) by  mouth 4 (four) times daily as needed for anxiety.   . mirtazapine (REMERON) 30 MG tablet Take 1 tablet (30 mg total) by mouth at bedtime.   . tamsulosin (FLOMAX) 0.4 MG CAPS capsule Take 1 capsule (0.4 mg total) by mouth daily.    No facility-administered encounter medications on file as of 11/11/2020.    Patient Active Problem List   Diagnosis Date Noted  . Multinodular goiter 03/18/2020  . Papillary thyroid carcinoma ([place]) 03/18/2020  . Macular degeneration 09/04/2018  . Cervical lymphadenopathy 07/18/2018  . Pain in right hip 07/18/2018  . Lymphadenopathy 06/25/2018  . Stomatitis and mucositis 06/25/2018  . Shortness of breath 02/16/2018  . Arm fracture, left 01/03/2018  . Arthritis 01/03/2018  . Compression fracture of thoracic vertebra ([place]) 01/03/2018  . Emphysema lung ([place]) 01/03/2018  . GERD (gastroesophageal reflux disease) 01/03/2018  . GI bleeding 01/03/2018  . Lumbar spondylosis 01/03/2018  . Menstrual problem 01/03/2018  . Osteoarthritis of knee 01/03/2018  . Osteoporosis 01/03/2018  . Acute pain of right knee 01/03/2018  . Synovial cyst of popliteal space 01/03/2018  . History of urinary retention 11/10/2017  . Generalized anxiety disorder 10/24/2017  . Constipation 10/24/2017  . Internal hemorrhoids 10/24/2017  . Essential (primary) hypertension 10/24/2017  . Thrombocytopenia ([place]) 10/24/2017  . Acute encephalopathy 08/04/2017  . Hyponatremia 12/15/2015    Conditions to be addressed/monitored: Anxiety and Depression; Level of care concerns, ADL IADL limitations, Mental Health Concerns , Social Isolation and Limited access to caregiver  Care Plan : General Social Work (Adult)  Updates made by Greg Cutter, LCSW since 11/11/2020 12:00 AM    Problem: Coping Skills (General Plan of Care)     Long-Range Goal: Coping Skills Enhanced   Start Date: 11/11/2020  This Visit's Progress: On track  Note:   Evidence-based guidance:   Acknowledge, normalize and  validate difficulty of making life-long lifestyle changes.   Identify current effective and ineffective coping strategies.   Encourage patient and caregiver participation in care to increase self-esteem, confidence and feelings of control.   Consider alternative and complementary therapy approaches such as meditation, mindfulness or yoga.   Encourage participation in cognitive behavioral therapy to foster a positive identity, increase self-awareness, as well as bolster self-esteem, confidence and self-efficacy.   Discuss spirituality; be present as concerns are identified; encourage journaling, prayer, worship services, meditation or pastoral counseling.   Encourage participation in pleasurable group activities such as hobbies, singing, sports or volunteering).   Encourage the use of mindfulness; refer for training or intensive intervention.   Consider the use of meditative movement therapy such as tai chi, yoga or qigong.   Promote a regular daily exercise program based on tolerance, ability and patient choice to support positive thinking about disease or aging.   Notes:   Timeframe:  Long-Range Goal Priority:  Medium  Start Date:  11/11/20                      Expected  End Date:  02/08/21                    Follow Up Date- 90 days from 11/11/20  Current Barriers:  [name] Knowledge Deficits related to community resources to assist with patient's care . Lacks caregiver support.  . Film/video editor.  . Cognitive Deficits . No Advanced Directives in place . Ongoing depression/anxiety  Licensed Clinical Social Worker Goal(s):  [name] Over the next 90 days, patient will work with LCSW to address needs related to Caregiver strain/mental health management   Interventions: ALL INTERVENTIONS DISCUSSED WITH DAUGHTER  . Evaluation of current treatment plan related to HTN, GAD, Chronic pain, UTIs and patient's adherence to plan as established by provider. . Discussed plans with patient for  ongoing care management follow up and provided patient with direct contact information for care management team . CCM RNCM referral for follow up with patient about her health care/recent allergic reactions/unknown causes  . Talked with daughters [name] and Rosemarie Ax in the past at length concerning the patient's care. She expresses the great exhaustion both daughters are experiencing caring for their mother and trying to continue working. However, things have improved since hiring new caregiver. Daughter currently is experiencing hives from her job and caregiver stress but reports that she and patient are both stable for now. CCM LCSW provided emotional support, coping skill education and reflective listening to caregiver.  . Family report patient's mood has "somewhat improved" since socialization gained with new caregiver. Patient gets along well with her caregiver. Patient's anxiety has decreased with the help of additional medication assistance which is what daughter had requested. . Discussed the benefit of Palliative care to have NP come to the patient's home to evaluate patient and assist with symptom management. However, patient declined wanting this service as they did not see any benefits from their implementation. Palliative Care has been discontinued.  [name] reports that patient was diagnosed with thyroid cancer and completed surgery but they were unable to remove one part of the cancer. Patient will need to have treatment for this part that was unable to be removed but they are needing to get her blood work completed first to ensure that her thyroid is elevated enough for iodine treatment. . Patient and mother both had COVID over the holidays after being exposed to it by patient's caregiver but are doing well now.  . Family report stable transportation to upcoming medical appointments.  Patient Self Care Activities:  . Calls provider office for new concerns or questions inappropriately at times   . Unable to independently care for self . Does not attend all scheduled provider appointments . Unable to perform IADLs independently . Vision impairment . Hearing impairment  Please see past updates related to this goal by clicking on the "Past Updates" button in the selected goal          Follow Up Plan: SW will follow up with patient by phone over the next quarter      Eula Fried, [place], MSW, [email or web address] Phone: [phone]

## 2020-11-12 ENCOUNTER — Ambulatory Visit: Admission: RE | Admit: 2020-11-12 | Payer: Medicare Other | Source: Ambulatory Visit

## 2020-11-12 DIAGNOSIS — E890 Postprocedural hypothyroidism: Secondary | ICD-10-CM | POA: Diagnosis not present

## 2020-11-13 ENCOUNTER — Telehealth: Payer: Self-pay

## 2020-11-19 ENCOUNTER — Telehealth: Payer: Self-pay | Admitting: Physician Assistant

## 2020-11-19 ENCOUNTER — Other Ambulatory Visit: Payer: Self-pay

## 2020-11-19 ENCOUNTER — Ambulatory Visit (INDEPENDENT_AMBULATORY_CARE_PROVIDER_SITE_OTHER): Payer: Medicare Other | Admitting: Physician Assistant

## 2020-11-19 DIAGNOSIS — Z7689 Persons encountering health services in other specified circumstances: Secondary | ICD-10-CM

## 2020-11-19 DIAGNOSIS — N3289 Other specified disorders of bladder: Secondary | ICD-10-CM

## 2020-11-19 DIAGNOSIS — R339 Retention of urine, unspecified: Secondary | ICD-10-CM

## 2020-11-19 DIAGNOSIS — R34 Anuria and oliguria: Secondary | ICD-10-CM

## 2020-11-19 LAB — BLADDER SCAN AMB NON-IMAGING

## 2020-11-19 MED ORDER — MIRABEGRON ER 25 MG PO TB24: 25.0000 mg | ORAL_TABLET | Freq: Every day | ORAL | 0 refills | Status: DC

## 2020-11-19 NOTE — Telephone Encounter (Signed)
[name] called the office this afternoon requesting a same day appointment.  Patient's catheter has not been draining well and this morning it is not draining at all.  Patient states that she is experiencing some pain.  I added her appointment to Sam's schedule for today.

## 2020-11-19 NOTE — Progress Notes (Signed)
11/19/2020 4:38 PM   [name] 1925-01-27 [id number]  CC: Chief Complaint  Patient presents with  . Urinary Retention   HPI: [name] is a 87 y.o. female with Foley dependent urinary retention who presents today for evaluation of nondraining Foley catheter and lower abdominal pain.  She is accompanied today by her daughter, who contributes to HPI.  Today she reports intermittent sensations of urinary urgency with chills and headaches.  She states her Foley catheter was not draining well yesterday and stopped draining this morning.  On physical exam, Foley catheter is in place and there is approximately 300 cc of yellow urine in her night bag.  Patient reports bag was most recently emptied approximately 4 hours ago.  Bladder scan with 47mL.  Notably, patient has a history of bladder spasms with indwelling Foley catheter.  She recently agreed to suprapubic catheter placement to reduce urethral discomfort associated with her Foley, but she had a change of heart and elected to cancel this.  Additionally, she has frequently reported chills and headaches in the past and this does not present an acute change.  Upon further questioning, patient reports that she does not wish to start new medications due to concerns for multiple medication allergies.  She states she can manage her symptoms and her bladder spasms are "not that bad."  PMH: Past Medical History:  Diagnosis Date  . Allergy    environmental  . Anxiety   . Chronic back pain   . High cholesterol   . History of urinary retention   . Hypertension   . Migraines   . Osteoporosis   . Pneumonia   . Sinusitis     Surgical History: Past Surgical History:  Procedure Laterality Date  . ABDOMINAL HYSTERECTOMY    . APPENDECTOMY    . BACK SURGERY    . BACK SURGERY  2008   2009  . BREAST BIOPSY Right 2002   neg  . HEMORRHOID SURGERY    . MOUTH SURGERY  2019  . TONSILLECTOMY      Home Medications:  Allergies  as of 11/19/2020      Reactions   Amoxicillin Itching, Swelling, Rash   Anaphylactoid reaction occurred around the time that the patient took both Amoxicillin and Cipro. ED notes state this reaction was to the Amoxicillin, patient's family reports it was to the Cipro.   Nitrofurantoin    swelling   Ceftin [cefuroxime]    Cephalosporins    Cortisone Other (See Comments)   Headache   Fluticasone Swelling   Facial   Levofloxacin Nausea And Vomiting   Meperidine Other (See Comments)   Morphine And Related Other (See Comments)   headache   Morphine Sulfate    Nisoldipine    Penicillin G Potassium In D5w    Prednisone Other (See Comments)   Headache   Sulfa Antibiotics Nausea Only   Cephalexin Swelling   Lip swelling with Keflex prescribed in January 2021. Family administered benadryl.   Ciprofloxacin Rash      Medication List       Accurate as of November 19, 2020  4:38 PM. If you have any questions, ask your nurse or doctor.        STOP taking these medications   tamsulosin 0.4 MG Caps capsule Commonly known as: FLOMAX Stopped by: Debroah Loop, PA-C     TAKE these medications   amLODipine 5 MG tablet Commonly known as: NORVASC Take 1 tablet (5 mg total) by mouth  daily.   calcitRIOL 1 MCG/ML solution Commonly known as: ROCALTROL   carvedilol 6.25 MG tablet Commonly known as: COREG Take 1 tablet (6.25 mg total) by mouth 2 (two) times daily with a meal.   doxycycline 100 MG tablet Commonly known as: VIBRA-TABS Take 1 tablet (100 mg total) by mouth 2 (two) times daily. For 10 days. Take with full glass of water, stay upright 30 min after taking.   EPINEPHrine 0.3 mg/0.3 mL Soaj injection Commonly known as: EPI-PEN   FLUoxetine 20 MG capsule Commonly known as: PROZAC Take 1 capsule (20 mg total) by mouth daily.   furosemide 20 MG tablet Commonly known as: LASIX Take 1 tablet (20 mg total) by mouth daily.   HYDROcodone-acetaminophen 10-325 MG  tablet Commonly known as: NORCO Take 1 tablet by mouth every 4 (four) hours as needed for moderate pain. six tablets a day every four hours --per pain management   levothyroxine 25 MCG tablet Commonly known as: SYNTHROID   LORazepam 0.5 MG tablet Commonly known as: ATIVAN Take 1 tablet (0.5 mg total) by mouth 4 (four) times daily as needed for anxiety.   mirtazapine 30 MG tablet Commonly known as: REMERON Take 1 tablet (30 mg total) by mouth at bedtime.       Allergies:  Allergies  Allergen Reactions  . Amoxicillin Itching, Swelling and Rash    Anaphylactoid reaction occurred around the time that the patient took both Amoxicillin and Cipro. ED notes state this reaction was to the Amoxicillin, patient's family reports it was to the Cipro.  . Nitrofurantoin     swelling  . Ceftin [Cefuroxime]   . Cephalosporins   . Cortisone Other (See Comments)    Headache   . Fluticasone Swelling    Facial  . Levofloxacin Nausea And Vomiting  . Meperidine Other (See Comments)  . Morphine And Related Other (See Comments)    headache  . Morphine Sulfate   . Nisoldipine   . Penicillin G Potassium In D5w   . Prednisone Other (See Comments)    Headache   . Sulfa Antibiotics Nausea Only  . Cephalexin Swelling    Lip swelling with Keflex prescribed in January 2021. Family administered benadryl.  . Ciprofloxacin Rash    Family History: Family History  Problem Relation Age of Onset  . Breast cancer Sister 10  . Breast cancer Maternal Grandmother 22  . Breast cancer Sister 40  . Bladder Cancer Brother   . Hypertension Neg Hx   . Kidney cancer Neg Hx     Social History:   reports that she has never smoked. She has never used smokeless tobacco. She reports that she does not drink alcohol and does not use drugs.  Physical Exam: There were no vitals taken for this visit.  Constitutional: Awake, no acute distress, nontoxic appearing, not ill-appearing, no diaphoresis HEENT: [place],  AT Cardiovascular: No clubbing, cyanosis, or edema Respiratory: Normal respiratory effort, no increased work of breathing Skin: No rashes, bruises or suspicious lesions Neurologic: Grossly intact, no focal deficits, moving all 4 extremities Psychiatric: Normal mood and affect  Laboratory Data: Results for orders placed or performed in visit on 11/19/20  Bladder Scan (Post Void Residual) in office  Result Value Ref Range   Scan Result 76mL    Assessment & Plan:   1. Encounter for evaluation of Foley catheter PVR 0 today.  Foley catheter draining well on physical exam.  No indication to exchange Foley catheter today. - Bladder Scan (Post Void  Residual) in office  2. Bladder spasms I explained to the patient and her daughter that I believe her bladder discomfort is recurrent bladder spasms.  I offered her Myrbetriq 25 mg daily for management of these.  Patient is hesitant to start a new medication.  Unsure if she will take this.  I had an honest conversation with the patient and her daughter today.  I explained that I do not believe her bladder discomfort represents urinary tract infection, however given her multiple medication allergies, the only antibiotic I would be comfortable treating her with empirically would be fosfomycin, which would be completed by the time her urine resulted.  I offered her fosfomycin today but the patient declined.  We again discussed that she may have reduced discomfort with placement of a suprapubic catheter.  Despite her earlier with change of heart, patient again agrees to [facility] tube placement today.  We will proceed with scheduling this with IR. - mirabegron ER (MYRBETRIQ) 25 MG TB24 tablet; Take 1 tablet (25 mg total) by mouth daily.  Dispense: 28 tablet; Refill: 0  3. Decreased urine output Patient is a poor historian due to age-related memory loss.  Unclear if this is secondary to decreased p.o. intake versus acute change in renal function.  I offered a BMP  today to evaluate her renal function, but patient declined.  Return in about 2 weeks (around 12/03/2020) for Catheter exchange.  Debroah Loop, PA-C  [facility] [address] [place], [place] [address] [phone]

## 2020-11-20 ENCOUNTER — Telehealth: Payer: Self-pay

## 2020-11-20 ENCOUNTER — Other Ambulatory Visit: Payer: Self-pay | Admitting: Physician Assistant

## 2020-11-20 MED ORDER — FOSFOMYCIN TROMETHAMINE 3 G PO PACK: 3.0000 g | PACK | Freq: Once | ORAL | 0 refills | Status: AC

## 2020-11-20 NOTE — Telephone Encounter (Signed)
Incoming call from pt's daughter who states that she believes the patient has a UTI. She states that the pt has a low grade fever of 98, she states that the patient's baseline tempeture is 96.0. Patient is c/o headache, malaise, chills, lower abdominal discomfort and foul smelling urine. Daughter states that the pt has had these symptoms x 1 week. Of note pt was seen in office yesterday and reported these symptoms to the provider, however at that time pt declined additional testing and treatment, patient states that today is open to treatment. Encouraged daughter to have pt continue Myrbetriq for bladder spasm, provider informed of information above and will send in antibiotic for pt. Daughter voiced understanding.

## 2020-11-25 ENCOUNTER — Ambulatory Visit: Payer: Self-pay | Admitting: Physician Assistant

## 2020-11-26 DIAGNOSIS — C73 Malignant neoplasm of thyroid gland: Secondary | ICD-10-CM | POA: Diagnosis not present

## 2020-11-26 DIAGNOSIS — E890 Postprocedural hypothyroidism: Secondary | ICD-10-CM | POA: Diagnosis not present

## 2020-11-28 ENCOUNTER — Other Ambulatory Visit: Payer: Self-pay | Admitting: Family Medicine

## 2020-11-28 DIAGNOSIS — F411 Generalized anxiety disorder: Secondary | ICD-10-CM

## 2020-12-03 ENCOUNTER — Ambulatory Visit (INDEPENDENT_AMBULATORY_CARE_PROVIDER_SITE_OTHER): Payer: Medicare Other | Admitting: Physician Assistant

## 2020-12-03 ENCOUNTER — Other Ambulatory Visit: Payer: Self-pay

## 2020-12-03 DIAGNOSIS — R339 Retention of urine, unspecified: Secondary | ICD-10-CM | POA: Diagnosis not present

## 2020-12-03 NOTE — Progress Notes (Signed)
Cath Change/ Replacement  Patient is present today for a catheter change due to urinary retention.  11ml of water was removed from the balloon, a 16FR foley cath was removed without difficulty.  Patient was cleaned and prepped in a sterile fashion with betadine. A 16 FR foley cath was replaced into the bladder no complications were noted Urine return was noted 93ml and urine was clear in color. The balloon was filled with 61ml of sterile water. A night bag was attached for drainage.      Performed by: Debroah Loop, PA-C   Follow up: Return in about 4 weeks (around 12/31/2020) for Catheter exchange.

## 2020-12-09 ENCOUNTER — Other Ambulatory Visit: Payer: Self-pay | Admitting: "Endocrinology

## 2020-12-09 DIAGNOSIS — C73 Malignant neoplasm of thyroid gland: Secondary | ICD-10-CM

## 2020-12-17 DIAGNOSIS — M4126 Other idiopathic scoliosis, lumbar region: Secondary | ICD-10-CM | POA: Diagnosis not present

## 2020-12-17 DIAGNOSIS — M4316 Spondylolisthesis, lumbar region: Secondary | ICD-10-CM | POA: Diagnosis not present

## 2020-12-17 DIAGNOSIS — M25561 Pain in right knee: Secondary | ICD-10-CM | POA: Diagnosis not present

## 2020-12-17 DIAGNOSIS — M5416 Radiculopathy, lumbar region: Secondary | ICD-10-CM | POA: Diagnosis not present

## 2020-12-17 DIAGNOSIS — M25551 Pain in right hip: Secondary | ICD-10-CM | POA: Diagnosis not present

## 2020-12-17 DIAGNOSIS — M961 Postlaminectomy syndrome, not elsewhere classified: Secondary | ICD-10-CM | POA: Diagnosis not present

## 2020-12-22 ENCOUNTER — Telehealth: Payer: Self-pay

## 2020-12-22 NOTE — Telephone Encounter (Signed)
52 am.  Phone call made to daughter [name] to follow up on patient's overall condition, offer a home visit and to see how PC could offer additional support.  No answer on cell phone and unable to leave a VM.  PLAN:  PC will outreach patient again at a later date.

## 2021-01-01 ENCOUNTER — Telehealth: Payer: Self-pay

## 2021-01-01 ENCOUNTER — Ambulatory Visit: Payer: Self-pay | Admitting: Physician Assistant

## 2021-01-01 NOTE — Telephone Encounter (Signed)
2 am. Phone call made to patient's home and spoke with caregiver [place].  She advised me to contact [name] on her cell phone.    48 am.  Phone call made to [name] and she was able to provide an update on patient's condition.  Patient is scheduled for iodine treatment next week due to thyroid carcinoma.  Daughter is concerned over this as patient will need to remain in her room for 3 days.  Daughter states she was hoping patient would remain in the hospital for this treatment.  If she is exposed to the treatment it could cause hypothyroidism for herself.  Daughter states it has caused her some anxiety but they will work through this.  Patient is also scheduled to have a supra pubic cath placed later this month.  [name] believes this will be best for her mother.  They will continue to be followed by urology. [name] denies any needs at this time.  She states other than upcoming treatments/procedures patient is doing well.  She continues to have a private caregiver in the home Monday-Thursday from 830-4.  PLAN:  Palliative Care will outreach daughter next month to offer support and home visit.

## 2021-01-05 ENCOUNTER — Ambulatory Visit
Admission: RE | Admit: 2021-01-05 | Discharge: 2021-01-05 | Disposition: A | Discharge status: Home / Self Care | Payer: Medicare Other | Source: Ambulatory Visit | Attending: "Endocrinology | Admitting: "Endocrinology

## 2021-01-05 ENCOUNTER — Other Ambulatory Visit: Payer: Self-pay

## 2021-01-05 DIAGNOSIS — C73 Malignant neoplasm of thyroid gland: Secondary | ICD-10-CM | POA: Diagnosis not present

## 2021-01-05 MED ORDER — SODIUM IODIDE I 131 CAPSULE
105.0000 | Freq: Once | INTRAVENOUS | Status: AC | PRN
  Administered 2021-01-05: 105 via ORAL

## 2021-01-07 ENCOUNTER — Other Ambulatory Visit: Payer: Self-pay | Admitting: Family Medicine

## 2021-01-07 DIAGNOSIS — F411 Generalized anxiety disorder: Secondary | ICD-10-CM

## 2021-01-07 DIAGNOSIS — Z515 Encounter for palliative care: Secondary | ICD-10-CM

## 2021-01-07 NOTE — Telephone Encounter (Signed)
Requested medications are due for refill today.  yes  Requested medications are on the active medications list.  yes  Last refill. 10/07/2020  Future visit scheduled.   no  Notes to clinic.  Medication not delegated.

## 2021-01-10 ENCOUNTER — Other Ambulatory Visit: Payer: Self-pay | Admitting: Family Medicine

## 2021-01-10 DIAGNOSIS — I10 Essential (primary) hypertension: Secondary | ICD-10-CM

## 2021-01-10 DIAGNOSIS — F411 Generalized anxiety disorder: Secondary | ICD-10-CM

## 2021-01-10 NOTE — Telephone Encounter (Signed)
Requested Prescriptions  Pending Prescriptions Disp Refills  . FLUoxetine (PROZAC) 20 MG capsule [Pharmacy Med Name: FLUOXETINE HCL 20 MG CAPSULE] 30 capsule 0    Sig: Take 1 capsule (20 mg total) by mouth daily.     Psychiatry:  Antidepressants - SSRI Passed - 01/10/2021 11:41 AM      Passed - Valid encounter within last 6 months    Recent Outpatient Visits          3 months ago COVID-19 virus infection   [place], DO   6 months ago Generalized anxiety disorder   [place], [place]   1 year ago Acute left otitis media   [place], DO   1 year ago Acute cystitis without hematuria   [facility] [name], [place]   1 year ago [place], [name], DO      Future Appointments            In 4 days Debroah Loop, [facility]           . carvedilol (COREG) 6.25 MG tablet [Pharmacy Med Name: CARVEDILOL 6.25 MG TABLET] 180 tablet 0    Sig: Take 1 tablet (6.25 mg total) by mouth 2 (two) times daily with a meal.     Cardiovascular:  Beta Blockers Passed - 01/10/2021 11:41 AM      Passed - Last BP in normal range    BP Readings from Last 1 Encounters:  09/26/20 122/74         Passed - Last Heart Rate in normal range    Pulse Readings from Last 1 Encounters:  09/26/20 80         Passed - Valid encounter within last 6 months    Recent Outpatient Visits          3 months ago COVID-19 virus infection   [place], DO   6 months ago Generalized anxiety disorder   [place], DO   1 year ago Acute left otitis media   [place], DO   1 year ago Acute cystitis without hematuria   [facility] [name], [place]   1 year  ago [place], [name], DO      Future Appointments            In 4 days Debroah Loop, [facility]

## 2021-01-14 ENCOUNTER — Ambulatory Visit: Payer: Medicare Other | Admitting: Physician Assistant

## 2021-01-19 ENCOUNTER — Ambulatory Visit (INDEPENDENT_AMBULATORY_CARE_PROVIDER_SITE_OTHER): Payer: Medicare Other | Admitting: Physician Assistant

## 2021-01-19 ENCOUNTER — Other Ambulatory Visit: Payer: Self-pay

## 2021-01-19 DIAGNOSIS — R339 Retention of urine, unspecified: Secondary | ICD-10-CM

## 2021-01-19 NOTE — Progress Notes (Signed)
Cath Change/ Replacement  Patient is present today for a catheter change due to urinary retention.  40ml of water was removed from the balloon, a 16FR foley cath was removed without difficulty.  Patient was cleaned and prepped in a sterile fashion with betadine. A 16 FR foley cath was replaced into the bladder no complications were noted Urine return was noted 47ml and urine was yellow in color. The balloon was filled with 17ml of sterile water. A night bag was attached for drainage. Patient tolerated well.    Performed by: Debroah Loop, PA-C and Bradly Bienenstock, CMA  Additional notes: Patient remains interested in pursuing SPT placement, however she requests to defer this several weeks after recently completing thyroid cancer treatment. Will plan for SPT referral following her next monthly cath change.   Follow up: Return in about 4 weeks (around 02/16/2021) for Catheter exchange.

## 2021-01-27 ENCOUNTER — Telehealth: Payer: Self-pay

## 2021-01-27 NOTE — Telephone Encounter (Signed)
11:26AM: Palliative care SW outreached patient/family for monthly telephonic visit and to schedule in person visit.  Call unsuccessful. SW unable to leave vm.  Will outreach again at later date and continue to offer palliative care support.

## 2021-02-05 ENCOUNTER — Other Ambulatory Visit: Payer: Self-pay | Admitting: Family Medicine

## 2021-02-05 DIAGNOSIS — E890 Postprocedural hypothyroidism: Secondary | ICD-10-CM | POA: Diagnosis not present

## 2021-02-05 DIAGNOSIS — F411 Generalized anxiety disorder: Secondary | ICD-10-CM

## 2021-02-05 DIAGNOSIS — C73 Malignant neoplasm of thyroid gland: Secondary | ICD-10-CM | POA: Diagnosis not present

## 2021-02-05 DIAGNOSIS — Z515 Encounter for palliative care: Secondary | ICD-10-CM

## 2021-02-05 NOTE — Telephone Encounter (Signed)
Requested medication (s) are due for refill today:  yes  Requested medication (s) are on the active medication list: yes  Last refill: 4/1/20222  Future visit scheduled: no  Notes to clinic:  this refill cannot be delegated    Requested Prescriptions  Pending Prescriptions Disp Refills   LORazepam (ATIVAN) 0.5 MG tablet [Pharmacy Med Name: LORAZEPAM 0.5 MG TABLET] 120 tablet 0    Sig: Take 1 tablet (0.5 mg total) by mouth 4 (four) times daily as needed for anxiety.      Not Delegated - Psychiatry:  Anxiolytics/Hypnotics Failed - 02/05/2021 11:34 AM      Failed - This refill cannot be delegated      Failed - Urine Drug Screen completed in last 360 days      Passed - Valid encounter within last 6 months    Recent Outpatient Visits           3 months ago COVID-19 virus infection   [place], DO   7 months ago Generalized anxiety disorder   [place], DO   1 year ago Acute left otitis media   [place], DO   1 year ago Acute cystitis without hematuria   [facility] [name], [place]   1 year ago [place], [name], DO       Future Appointments             In 2 weeks Debroah Loop, [facility]

## 2021-02-09 ENCOUNTER — Other Ambulatory Visit: Payer: Self-pay | Admitting: Family Medicine

## 2021-02-09 DIAGNOSIS — F411 Generalized anxiety disorder: Secondary | ICD-10-CM

## 2021-02-13 ENCOUNTER — Other Ambulatory Visit: Payer: Self-pay | Admitting: Family Medicine

## 2021-02-13 DIAGNOSIS — I10 Essential (primary) hypertension: Secondary | ICD-10-CM

## 2021-02-13 DIAGNOSIS — R601 Generalized edema: Secondary | ICD-10-CM

## 2021-02-18 ENCOUNTER — Other Ambulatory Visit: Payer: Self-pay | Admitting: Family Medicine

## 2021-02-18 DIAGNOSIS — F411 Generalized anxiety disorder: Secondary | ICD-10-CM

## 2021-02-18 NOTE — Telephone Encounter (Signed)
Requested Prescriptions  Pending Prescriptions Disp Refills  . mirtazapine (REMERON) 30 MG tablet [Pharmacy Med Name: MIRTAZAPINE 30 MG TABLET] 90 tablet 0    Sig: Take 1 tablet (30 mg total) by mouth at bedtime.     Psychiatry: Antidepressants - mirtazapine Failed - 02/18/2021  4:49 PM      Failed - Triglycerides in normal range and within 360 days    No results found for: TRIG, POCTRIG       Failed - Total Cholesterol in normal range and within 360 days    No results found for: CHOL, POCCHOL, CHOLTOT       Passed - AST in normal range and within 360 days    AST  Date Value Ref Range Status  04/16/2020 22 15 - 41 U/L Final   SGOT(AST)  Date Value Ref Range Status  05/03/2014 17 15 - 37 Unit/L Final         Passed - ALT in normal range and within 360 days    ALT  Date Value Ref Range Status  04/16/2020 12 0 - 44 U/L Final   SGPT (ALT)  Date Value Ref Range Status  05/03/2014 21 U/L Final    Comment:    14-63 NOTE: New Reference Range 04/30/14          Passed - WBC in normal range and within 360 days    WBC  Date Value Ref Range Status  04/16/2020 6.8 4.0 - 10.5 K/uL Final         Passed - Valid encounter within last 6 months    Recent Outpatient Visits          4 months ago COVID-19 virus infection   [place], DO   7 months ago Generalized anxiety disorder   [place], DO   1 year ago Acute left otitis media   [place], DO   1 year ago Acute cystitis without hematuria   [facility] [name], [place]   1 year ago [place], [name], DO      Future Appointments            In 1 week Debroah Loop, [facility]

## 2021-02-19 ENCOUNTER — Ambulatory Visit: Payer: Medicare Other | Admitting: Physician Assistant

## 2021-02-20 ENCOUNTER — Ambulatory Visit: Payer: Self-pay | Admitting: Physician Assistant

## 2021-02-24 ENCOUNTER — Other Ambulatory Visit: Payer: Self-pay

## 2021-02-24 ENCOUNTER — Other Ambulatory Visit: Payer: Medicare Other

## 2021-02-24 DIAGNOSIS — Z515 Encounter for palliative care: Secondary | ICD-10-CM

## 2021-02-24 NOTE — Progress Notes (Signed)
PATIENT NAME: [name] DOB: 01/05/1925 MRN: [id number]  PRIMARY CARE PROVIDER: Olin Hauser, DO  RESPONSIBLE PARTY:  Acct ID - Guarantor Home Phone Work Phone Relationship Acct Type  [id number] MARTA, BOUIE* [phone]  Self P/F     [address] [place], [place] [address]   TELEHEALTH VISIT STATEMENT Due to the COVID-19 crisis, this visit was done via telemedicine from my office and it was initiated and consent by this patient and or family.  PLAN OF CARE and INTERVENTIONS:               1.  GOALS OF CARE/ ADVANCE CARE PLANNING:  Remain home under the care of her daughter and private caregivers.               2. PERSONAL EMERGENCY PLAN:  Activate 911 for emergencies.               3.  DISEASE STATUS:  Spoke with daughter [name] regarding patient's condition.  Daughter feels patient is doing relatively well given her age.  They currently have private caregivers during the daytime hours as the daughter continues to work.  New caregiver has been hired recently due to another one taking a new job.   Patient's synthroid was recently increased to 88 mcg based on lab results.  Daughter notes next follow up visit will be in September.  Thyroid cancer treatment went well earlier this year.  Patient continues to eat well and is feeding herself.  No recent weight loss is noted.   Family continues to prepare meals.   Patient continues to utilize a rolling walker for safe ambulation.  No falls are reported since our last conversation.   Patient continues with a foley cath that is being changed monthly by urology.  There was discussion of a suprapubic cath being placed but this is currently on hold.  Daughter denies any complications with foley cath at this time.   Daughter notes patient is sleeping with no changes in her sleep pattern.   Daughter denies any needs at this time and is agreeable to continue monthly telephonic visits.    HISTORY OF PRESENT ILLNESS:  87 year old  female with hx of HTN.  Patient is being followed by Palliative Care monthly and PRN.   CODE STATUS: Full ADVANCED DIRECTIVES: No MOST FORM: No PPS: 50%      Lorenza Burton, RN

## 2021-02-26 ENCOUNTER — Other Ambulatory Visit: Payer: Self-pay

## 2021-02-26 ENCOUNTER — Ambulatory Visit (INDEPENDENT_AMBULATORY_CARE_PROVIDER_SITE_OTHER): Payer: Medicare Other | Admitting: Physician Assistant

## 2021-02-26 DIAGNOSIS — R339 Retention of urine, unspecified: Secondary | ICD-10-CM | POA: Diagnosis not present

## 2021-02-26 NOTE — Progress Notes (Signed)
Cath Change/ Replacement  Patient is present today for a catheter change due to urinary retention.  59ml of water was removed from the balloon, a 16FR foley cath was removed without difficulty.  Patient was cleaned and prepped in a sterile fashion with betadine. A 16 FR foley cath was replaced into the bladder no complications were noted Urine return was noted 57ml and urine was clear in color. The balloon was filled with 50ml of sterile water. A night bag was attached for drainage.  A night bag was also given to the patient and patient was given instruction on how to change from one bag to another.   Performed by: Debroah Loop, PA-C   Follow up: Return in about 4 weeks (around 03/26/2021) for Catheter exchange.

## 2021-03-10 DIAGNOSIS — M4126 Other idiopathic scoliosis, lumbar region: Secondary | ICD-10-CM | POA: Diagnosis not present

## 2021-03-10 DIAGNOSIS — M25551 Pain in right hip: Secondary | ICD-10-CM | POA: Diagnosis not present

## 2021-03-10 DIAGNOSIS — M25561 Pain in right knee: Secondary | ICD-10-CM | POA: Diagnosis not present

## 2021-03-10 DIAGNOSIS — M4316 Spondylolisthesis, lumbar region: Secondary | ICD-10-CM | POA: Diagnosis not present

## 2021-03-10 DIAGNOSIS — M5416 Radiculopathy, lumbar region: Secondary | ICD-10-CM | POA: Diagnosis not present

## 2021-03-13 ENCOUNTER — Other Ambulatory Visit: Payer: Self-pay | Admitting: Family Medicine

## 2021-03-13 DIAGNOSIS — Z515 Encounter for palliative care: Secondary | ICD-10-CM

## 2021-03-13 DIAGNOSIS — F411 Generalized anxiety disorder: Secondary | ICD-10-CM

## 2021-03-13 NOTE — Telephone Encounter (Signed)
Medication Refill - Medication: LORazepam (ATIVAN) 0.5 MG tablet   Has the patient contacted their pharmacy? Yes.   (Agent: If no, request that the patient contact the pharmacy for the refill.) (Agent: If yes, when and what did the pharmacy advise?)  Preferred Pharmacy (with phone number or street name): [place], [place] - [address]  [place], [place] [address]  Phone:  [phone] Fax:  [phone]  Agent: Please be advised that RX refills may take up to 3 business days. We ask that you follow-up with your pharmacy.

## 2021-03-13 NOTE — Telephone Encounter (Signed)
Requested medication (s) are due for refill today: yes  Requested medication (s) are on the active medication list:yes   Last refill:  02/05/2021  Future visit scheduled:no   Notes to clinic: this refill cannot be delegated    Requested Prescriptions  Pending Prescriptions Disp Refills   LORazepam (ATIVAN) 0.5 MG tablet [Pharmacy Med Name: LORAZEPAM 0.5 MG TABLET] 120 tablet 0    Sig: Take 1 tablet (0.5 mg total) by mouth 4 (four) times daily as needed for anxiety.      Not Delegated - Psychiatry:  Anxiolytics/Hypnotics Failed - 03/13/2021 10:54 AM      Failed - This refill cannot be delegated      Failed - Urine Drug Screen completed in last 360 days      Passed - Valid encounter within last 6 months    Recent Outpatient Visits           5 months ago COVID-19 virus infection   [place], DO   8 months ago Generalized anxiety disorder   [place], DO   1 year ago Acute left otitis media   [place], DO   1 year ago Acute cystitis without hematuria   [facility] [name], [place]   1 year ago [place], [name], DO       Future Appointments             In 2 weeks Debroah Loop, [facility]

## 2021-03-13 NOTE — Telephone Encounter (Signed)
I believe this is your patient

## 2021-03-16 ENCOUNTER — Other Ambulatory Visit: Payer: Self-pay | Admitting: Family Medicine

## 2021-03-16 DIAGNOSIS — I10 Essential (primary) hypertension: Secondary | ICD-10-CM

## 2021-03-16 DIAGNOSIS — F411 Generalized anxiety disorder: Secondary | ICD-10-CM

## 2021-03-31 ENCOUNTER — Other Ambulatory Visit: Payer: Self-pay

## 2021-03-31 ENCOUNTER — Ambulatory Visit (INDEPENDENT_AMBULATORY_CARE_PROVIDER_SITE_OTHER): Payer: Medicare Other | Admitting: Physician Assistant

## 2021-03-31 DIAGNOSIS — R339 Retention of urine, unspecified: Secondary | ICD-10-CM | POA: Diagnosis not present

## 2021-03-31 NOTE — Progress Notes (Signed)
Cath Change/ Replacement  Patient is present today for a catheter change due to urinary retention.  81ml of water was removed from the balloon, a 16FR foley cath was removed without difficulty.  Patient was cleaned and prepped in a sterile fashion with betadine. A 16 FR foley cath was replaced into the bladder no complications were noted Urine return was noted 45ml and urine was clear in color. The balloon was filled with 54ml of sterile water. A night bag was attached for drainage.  A night bag was also given to the patient and patient was given instruction on how to change from one bag to another. Patient was given proper instruction on catheter care.    Performed by: Debroah Loop, PA-C   Follow up: Return in about 4 weeks (around 04/28/2021) for Catheter exchange.

## 2021-04-07 ENCOUNTER — Telehealth: Payer: Self-pay

## 2021-04-07 DIAGNOSIS — K642 Third degree hemorrhoids: Secondary | ICD-10-CM | POA: Diagnosis not present

## 2021-04-07 NOTE — Telephone Encounter (Signed)
11:45 AM: Palliative care SW outreached patient/family for monthly telephonic visit and to schedule in person visit.  Call unsuccessful. SW unable to LVM. Due to mailbox not being set up. Will outreach again at later date and continue to offer palliative care support.

## 2021-04-08 ENCOUNTER — Other Ambulatory Visit: Payer: Self-pay | Admitting: Family Medicine

## 2021-04-08 DIAGNOSIS — F411 Generalized anxiety disorder: Secondary | ICD-10-CM

## 2021-04-08 DIAGNOSIS — Z515 Encounter for palliative care: Secondary | ICD-10-CM

## 2021-04-08 NOTE — Telephone Encounter (Signed)
Requested medication (s) are due for refill today - yes  Requested medication (s) are on the active medication list -yes  Future visit scheduled -no  Last refill: 03/13/21  Notes to clinic: Request non delegated Rx  Requested Prescriptions  Pending Prescriptions Disp Refills   LORazepam (ATIVAN) 0.5 MG tablet [Pharmacy Med Name: LORAZEPAM 0.5 MG TABLET] 120 tablet 0    Sig: Take 1 tablet (0.5 mg total) by mouth 4 (four) times daily as needed for anxiety.      Not Delegated - Psychiatry:  Anxiolytics/Hypnotics Failed - 04/08/2021  2:12 PM      Failed - This refill cannot be delegated      Failed - Urine Drug Screen completed in last 360 days      Failed - Valid encounter within last 6 months    Recent Outpatient Visits           6 months ago COVID-19 virus infection   [facility] Olin Hauser, DO   9 months ago Generalized anxiety disorder   [place], [name], DO   1 year ago Acute left otitis media   [place], DO   1 year ago Acute cystitis without hematuria   [place], [place]   1 year ago [place], DO       Future Appointments             In 3 weeks Debroah Loop, [place] [facility]                 Requested Prescriptions  Pending Prescriptions Disp Refills   LORazepam (ATIVAN) 0.5 MG tablet [Pharmacy Med Name: LORAZEPAM 0.5 MG TABLET] 120 tablet 0    Sig: Take 1 tablet (0.5 mg total) by mouth 4 (four) times daily as needed for anxiety.      Not Delegated - Psychiatry:  Anxiolytics/Hypnotics Failed - 04/08/2021  2:12 PM      Failed - This refill cannot be delegated      Failed - Urine Drug Screen completed in last 360 days      Failed - Valid encounter within last 6 months    Recent Outpatient Visits           6 months ago COVID-19  virus infection   [place], DO   9 months ago Generalized anxiety disorder   [place], DO   1 year ago Acute left otitis media   [place], DO   1 year ago Acute cystitis without hematuria   [facility] [name], [place]   1 year ago [place], [name], DO       Future Appointments             In 3 weeks Luana Shu [facility]

## 2021-04-16 ENCOUNTER — Other Ambulatory Visit: Payer: Self-pay | Admitting: Family Medicine

## 2021-04-16 DIAGNOSIS — F411 Generalized anxiety disorder: Secondary | ICD-10-CM

## 2021-04-16 DIAGNOSIS — I10 Essential (primary) hypertension: Secondary | ICD-10-CM

## 2021-04-16 NOTE — Telephone Encounter (Signed)
Requested medication (s) are due for refill today: yes   Requested medication (s) are on the active medication list: ys   Last refill:  03/16/2021  Future visit scheduled: no  Notes to clinic:  overdue for office visit Unable to leave vm    Requested Prescriptions  Pending Prescriptions Disp Refills   carvedilol (COREG) 6.25 MG tablet [Pharmacy Med Name: CARVEDILOL 6.25 MG TABLET] 60 tablet 0    Sig: Take 1 tablet (6.25 mg total) by mouth 2 (two) times daily with a meal.      Cardiovascular:  Beta Blockers Failed - 04/16/2021 10:52 AM      Failed - Valid encounter within last 6 months    Recent Outpatient Visits           6 months ago COVID-19 virus infection   [place], [name], DO   9 months ago Generalized anxiety disorder   [place], [name], DO   1 year ago Acute left otitis media   [place], DO   1 year ago Acute cystitis without hematuria   [facility] [name], [place]   1 year ago [place], DO       Future Appointments             In 2 weeks Debroah Loop, [place] [facility]             Passed - Last BP in normal range    BP Readings from Last 1 Encounters:  09/26/20 122/74          Passed - Last Heart Rate in normal range    Pulse Readings from Last 1 Encounters:  09/26/20 80            FLUoxetine (PROZAC) 20 MG capsule [Pharmacy Med Name: FLUOXETINE HCL 20 MG CAPSULE] 30 capsule 0    Sig: Take 1 capsule (20 mg total) by mouth daily.      Psychiatry:  Antidepressants - SSRI Failed - 04/16/2021 10:52 AM      Failed - Valid encounter within last 6 months    Recent Outpatient Visits           6 months ago COVID-19 virus infection   [place], DO   9 months ago Generalized anxiety disorder    [place], DO   1 year ago Acute left otitis media   [place], DO   1 year ago Acute cystitis without hematuria   [facility] [name], [place]   1 year ago [place], [name], DO       Future Appointments             In 2 weeks Debroah Loop, [facility]

## 2021-04-30 ENCOUNTER — Ambulatory Visit (INDEPENDENT_AMBULATORY_CARE_PROVIDER_SITE_OTHER): Payer: Medicare Other | Admitting: Physician Assistant

## 2021-04-30 ENCOUNTER — Other Ambulatory Visit: Payer: Self-pay

## 2021-04-30 DIAGNOSIS — R339 Retention of urine, unspecified: Secondary | ICD-10-CM

## 2021-04-30 NOTE — Progress Notes (Signed)
Cath Change/ Replacement  Patient is present today for a catheter change due to urinary retention.  4ml of water was removed from the balloon, a 16FR foley cath was removed with out difficulty.  Patient was cleaned and prepped in a sterile fashion with betadine. A 16 FR foley cath was replaced into the bladder no complications were noted Urine return was noted 37ml and urine was yellow in color. The balloon was filled with 44ml of sterile water. A night bag was attached for drainage.  Patient tolerated well.    Performed by: Debroah Loop, PA-C   Follow up: Return in about 4 weeks (around 05/28/2021) for Catheter exchange.

## 2021-05-11 ENCOUNTER — Other Ambulatory Visit: Payer: Self-pay | Admitting: Family Medicine

## 2021-05-11 DIAGNOSIS — F411 Generalized anxiety disorder: Secondary | ICD-10-CM

## 2021-05-11 NOTE — Telephone Encounter (Signed)
Requested medications are due for refill today.  yes  Requested medications are on the active medications list.  yes  Last refill. 02/18/2021  Future visit scheduled.   no  Notes to clinic.  Patient is more than 3 months overdue for OV.

## 2021-05-14 ENCOUNTER — Other Ambulatory Visit: Payer: Self-pay | Admitting: Family Medicine

## 2021-05-14 DIAGNOSIS — F411 Generalized anxiety disorder: Secondary | ICD-10-CM

## 2021-05-14 DIAGNOSIS — R601 Generalized edema: Secondary | ICD-10-CM

## 2021-05-14 DIAGNOSIS — I10 Essential (primary) hypertension: Secondary | ICD-10-CM

## 2021-05-14 NOTE — Telephone Encounter (Signed)
Requested medications are due for refill today yes  Requested medications are on the active medication list yes  Last visit 06/2020 except once for Covid  Future visit scheduled no  Notes to clinic Has already had a curtesy refill and there is no upcoming appointment scheduled.

## 2021-06-05 ENCOUNTER — Other Ambulatory Visit: Payer: Self-pay

## 2021-06-05 ENCOUNTER — Telehealth: Payer: Medicare Other

## 2021-06-05 DIAGNOSIS — Z515 Encounter for palliative care: Secondary | ICD-10-CM

## 2021-06-05 NOTE — Progress Notes (Signed)
COMMUNITY PALLIATIVE CARE SW NOTE  PATIENT NAME: COURTNEI GIORNO DOB: 04-06-1925 MRN: [id number]  PRIMARY CARE PROVIDER: Olin Hauser, DO  RESPONSIBLE PARTY:  Acct ID - Guarantor Home Phone Work Phone Relationship Acct Type  [id number] JAZZLYNE, MECKLENBURG* [phone]  Self P/F     [address], [place], [place] [address]                                                                 Telehealth Visit  12:30PM: Palliative care SW outreached patient to complete telephonic visit.   Palliative care SW outreached patient to complete telephonic visit. [name] provided update on medical condition and/or changes. Daughter shares that patient has been maintaining same level of care since last in home visit, with no changes or declines. Patient continues to use her RW for mobility assistance and has a caregiver for standby supervision if needed.   Daughter shares that patients appetite remains good. No significant weight changes noted.   Patient continues with a foley cath that is being changed monthly by urology. No complications noted with foley cath at this time. No medication needs or adjustments noted.  Psychosocial assessment: No financial, food insecurities or issues with transportation noted. No other psychosocial needs. No S/S of depression or anxiety noted. Patient continues to have private caregivers in the home M-Th while daughter is at work. No concerns with caregivers. Daughter states that she feels patient has been doing well. Daughter appreciative of telephonic check in.   Daughter denied in home visit at this time due to patient having a number of upcoming appointments, but encouraged another telephonic check in next month.  Palliative care will continue to monitor and assist with long term care planning as needed.      SOCIAL HX:  Social History   Tobacco Use   Smoking status: Never   Smokeless tobacco: Never  Substance Use Topics   Alcohol use: No     CODE STATUS: Full code ADVANCED DIRECTIVES: Y MOST FORM COMPLETE:  N HOSPICE EDUCATION PROVIDED: N  PPS: patient is MIN A with [facility]      [place], LCSW

## 2021-06-06 ENCOUNTER — Other Ambulatory Visit: Payer: Self-pay | Admitting: Family Medicine

## 2021-06-06 DIAGNOSIS — F411 Generalized anxiety disorder: Secondary | ICD-10-CM

## 2021-06-06 DIAGNOSIS — Z515 Encounter for palliative care: Secondary | ICD-10-CM

## 2021-06-06 NOTE — Telephone Encounter (Signed)
Requested medication (s) are due for refill today: yes  Requested medication (s) are on the active medication list: yes  Last refill:  04/08/21  Future visit scheduled: no  Notes to clinic: Med not delegated to NT to RF   Requested Prescriptions  Pending Prescriptions Disp Refills   LORazepam (ATIVAN) 0.5 MG tablet [Pharmacy Med Name: LORAZEPAM 0.5 MG TABLET] 120 tablet 0    Sig: Take 1 tablet (0.5 mg total) by mouth 4 (four) times daily as needed for anxiety.     Not Delegated - Psychiatry:  Anxiolytics/Hypnotics Failed - 06/06/2021 11:18 AM      Failed - This refill cannot be delegated      Failed - Urine Drug Screen completed in last 360 days      Failed - Valid encounter within last 6 months    Recent Outpatient Visits           8 months ago COVID-19 virus infection   [place], DO   11 months ago Generalized anxiety disorder   [place], DO   1 year ago Acute left otitis media   [place], DO   1 year ago Acute cystitis without hematuria   [facility] [name], [place]   1 year ago [place], [name], DO       Future Appointments             In 1 week Debroah Loop, [facility]

## 2021-06-08 ENCOUNTER — Ambulatory Visit: Payer: Self-pay | Admitting: Physician Assistant

## 2021-06-08 DIAGNOSIS — E000 Congenital iodine-deficiency syndrome, neurological type: Secondary | ICD-10-CM | POA: Diagnosis not present

## 2021-06-08 DIAGNOSIS — M5416 Radiculopathy, lumbar region: Secondary | ICD-10-CM | POA: Diagnosis not present

## 2021-06-08 DIAGNOSIS — M961 Postlaminectomy syndrome, not elsewhere classified: Secondary | ICD-10-CM | POA: Diagnosis not present

## 2021-06-08 DIAGNOSIS — M4126 Other idiopathic scoliosis, lumbar region: Secondary | ICD-10-CM | POA: Diagnosis not present

## 2021-06-16 ENCOUNTER — Ambulatory Visit: Payer: Medicare Other | Admitting: Physician Assistant

## 2021-06-18 ENCOUNTER — Other Ambulatory Visit: Payer: Self-pay | Admitting: Family Medicine

## 2021-06-18 DIAGNOSIS — F411 Generalized anxiety disorder: Secondary | ICD-10-CM

## 2021-06-19 NOTE — Telephone Encounter (Signed)
Requested medications are due for refill today yes  Requested medications are on the active medication list yes  Last refill 8/5  Last visit Tele visit 06/2020, [place] 10/09/20  Future visit scheduled no  Notes to clinic Has already had a curtesy refill and there is no upcoming appointment scheduled.

## 2021-06-19 NOTE — Telephone Encounter (Signed)
Patients daughter called in , patient is out of this medication, requests short supply til refill comes in

## 2021-06-22 ENCOUNTER — Other Ambulatory Visit: Payer: Self-pay | Admitting: Family Medicine

## 2021-06-22 DIAGNOSIS — I10 Essential (primary) hypertension: Secondary | ICD-10-CM

## 2021-06-22 NOTE — Telephone Encounter (Signed)
Requested medication (s) are due for refill today: yes  Requested medication (s) are on the active medication list: yes  Last refill: 05/15/21  #60 0 refills  Future visit scheduled 06/30/21  AWV  with nurse  Notes to clinic:  Please review; Last  appt covid related  Requested Prescriptions  Pending Prescriptions Disp Refills   carvedilol (COREG) 6.25 MG tablet [Pharmacy Med Name: CARVEDILOL 6.25 MG TABLET] 60 tablet 0    Sig: Take 1 tablet (6.25 mg total) by mouth 2 (two) times daily with a meal.     Cardiovascular:  Beta Blockers Failed - 06/22/2021  9:56 AM      Failed - Valid encounter within last 6 months    Recent Outpatient Visits           8 months ago COVID-19 virus infection   [facility] Olin Hauser, DO   12 months ago Generalized anxiety disorder   [place], [name], DO   1 year ago Acute left otitis media   [place], DO   1 year ago Acute cystitis without hematuria   [facility] [name], [place]   2 years ago [place], [name], DO       Future Appointments             Tomorrow Debroah Loop, PA-C [facility]   In 1 week  [facility], [place] BP in normal range    BP Readings from Last 1 Encounters:  09/26/20 122/74          Passed - Last Heart Rate in normal range    Pulse Readings from Last 1 Encounters:  09/26/20 80

## 2021-06-23 ENCOUNTER — Ambulatory Visit (INDEPENDENT_AMBULATORY_CARE_PROVIDER_SITE_OTHER): Payer: Medicare Other | Admitting: Physician Assistant

## 2021-06-23 ENCOUNTER — Other Ambulatory Visit: Payer: Self-pay

## 2021-06-23 DIAGNOSIS — R339 Retention of urine, unspecified: Secondary | ICD-10-CM

## 2021-06-23 NOTE — Progress Notes (Signed)
Cath Change/ Replacement  Patient is present today for a catheter change due to urinary retention.  54m of water was removed from the balloon, a 16FR foley cath was removed without difficulty.  Patient was cleaned and prepped in a sterile fashion with betadine. A 16 FR foley cath was replaced into the bladder no complications were noted Urine return was noted 281mand urine was yellow in color. The balloon was filled with 1022mf sterile water. A night bag was attached for drainage.  Patient tolerated well.    Performed by: SamDebroah LoopA-C   Additional notes: Daughter reports concerns for UTI today, with malodorous urine over the past 48 hours and patient reported pain 2 days ago. Patient states pain is minimal to improved today and it is unclear if she is referring to lower abdominal pain or urethral discomfort from Foley. Obtained urine sample from new catheter for culture but will defer abx today and recheck symptoms prior to prescribing.  Follow up: Return in about 4 weeks (around 07/21/2021) for Catheter exchange.

## 2021-06-30 ENCOUNTER — Ambulatory Visit (INDEPENDENT_AMBULATORY_CARE_PROVIDER_SITE_OTHER): Payer: Medicare Other

## 2021-06-30 VITALS — Ht 59.0 in | Wt 102.0 lb

## 2021-06-30 DIAGNOSIS — Z0000 Encounter for general adult medical examination without abnormal findings: Secondary | ICD-10-CM

## 2021-06-30 NOTE — Patient Instructions (Signed)
[name] , Thank you for taking time to come for your Medicare Wellness Visit. I appreciate your ongoing commitment to your health goals. Please review the following plan we discussed and let me know if I can assist you in the future.   Screening recommendations/referrals: Colonoscopy: not required Mammogram: not required Bone Density: due Recommended yearly ophthalmology/optometry visit for glaucoma screening and checkup Recommended yearly dental visit for hygiene and checkup  Vaccinations: Influenza vaccine: decline Pneumococcal vaccine: decline Tdap vaccine: decline Shingles vaccine: decline   Covid-19:decline  Advanced directives: Advance directive discussed with you today.   Conditions/risks identified: none  Next appointment: Follow up in one year for your annual wellness visit    Preventive Care 65 Years and Older, Female Preventive care refers to lifestyle choices and visits with your health care provider that can promote health and wellness. What does preventive care include? A yearly physical exam. This is also called an annual well check. Dental exams once or twice a year. Routine eye exams. Ask your health care provider how often you should have your eyes checked. Personal lifestyle choices, including: Daily care of your teeth and gums. Regular physical activity. Eating a healthy diet. Avoiding tobacco and drug use. Limiting alcohol use. Practicing safe sex. Taking low-dose aspirin every day. Taking vitamin and mineral supplements as recommended by your health care provider. What happens during an annual well check? The services and screenings done by your health care provider during your annual well check will depend on your age, overall health, lifestyle risk factors, and family history of disease. Counseling  Your health care provider may ask you questions about your: Alcohol use. Tobacco use. Drug use. Emotional well-being. Home and relationship  well-being. Sexual activity. Eating habits. History of falls. Memory and ability to understand (cognition). Work and work Statistician. Reproductive health. Screening  You may have the following tests or measurements: Height, weight, and BMI. Blood pressure. Lipid and cholesterol levels. These may be checked every 5 years, or more frequently if you are over 1 years old. Skin check. Lung cancer screening. You may have this screening every year starting at age 59 if you have a 30-pack-year history of smoking and currently smoke or have quit within the past 15 years. Fecal occult blood test (FOBT) of the stool. You may have this test every year starting at age 73. Flexible sigmoidoscopy or colonoscopy. You may have a sigmoidoscopy every 5 years or a colonoscopy every 10 years starting at age 42. Hepatitis C blood test. Hepatitis B blood test. Sexually transmitted disease (STD) testing. Diabetes screening. This is done by checking your blood sugar (glucose) after you have not eaten for a while (fasting). You may have this done every 1-3 years. Bone density scan. This is done to screen for osteoporosis. You may have this done starting at age 54. Mammogram. This may be done every 1-2 years. Talk to your health care provider about how often you should have regular mammograms. Talk with your health care provider about your test results, treatment options, and if necessary, the need for more tests. Vaccines  Your health care provider may recommend certain vaccines, such as: Influenza vaccine. This is recommended every year. Tetanus, diphtheria, and acellular pertussis (Tdap, Td) vaccine. You may need a Td booster every 10 years. Zoster vaccine. You may need this after age 61. Pneumococcal 13-valent conjugate (PCV13) vaccine. One dose is recommended after age 57. Pneumococcal polysaccharide (PPSV23) vaccine. One dose is recommended after age 76. Talk to your health  care provider about which  screenings and vaccines you need and how often you need them. This information is not intended to replace advice given to you by your health care provider. Make sure you discuss any questions you have with your health care provider. Document Released: 10/24/2015 Document Revised: 06/16/2016 Document Reviewed: 07/29/2015 Elsevier Interactive Patient Education  2017 [employer] Prevention in the Home Falls can cause injuries. They can happen to people of all ages. There are many things you can do to make your home safe and to help prevent falls. What can I do on the outside of my home? Regularly fix the edges of walkways and driveways and fix any cracks. Remove anything that might make you trip as you walk through a door, such as a raised step or threshold. Trim any bushes or trees on the path to your home. Use bright outdoor lighting. Clear any walking paths of anything that might make someone trip, such as rocks or tools. Regularly check to see if handrails are loose or broken. Make sure that both sides of any steps have handrails. Any raised decks and porches should have guardrails on the edges. Have any leaves, snow, or ice cleared regularly. Use sand or salt on walking paths during winter. Clean up any spills in your garage right away. This includes oil or grease spills. What can I do in the bathroom? Use night lights. Install grab bars by the toilet and in the tub and shower. Do not use towel bars as grab bars. Use non-skid mats or decals in the tub or shower. If you need to sit down in the shower, use a plastic, non-slip stool. Keep the floor dry. Clean up any water that spills on the floor as soon as it happens. Remove soap buildup in the tub or shower regularly. Attach bath mats securely with double-sided non-slip rug tape. Do not have throw rugs and other things on the floor that can make you trip. What can I do in the bedroom? Use night lights. Make sure that you have a  light by your bed that is easy to reach. Do not use any sheets or blankets that are too big for your bed. They should not hang down onto the floor. Have a firm chair that has side arms. You can use this for support while you get dressed. Do not have throw rugs and other things on the floor that can make you trip. What can I do in the kitchen? Clean up any spills right away. Avoid walking on wet floors. Keep items that you use a lot in easy-to-reach places. If you need to reach something above you, use a strong step stool that has a grab bar. Keep electrical cords out of the way. Do not use floor polish or wax that makes floors slippery. If you must use wax, use non-skid floor wax. Do not have throw rugs and other things on the floor that can make you trip. What can I do with my stairs? Do not leave any items on the stairs. Make sure that there are handrails on both sides of the stairs and use them. Fix handrails that are broken or loose. Make sure that handrails are as long as the stairways. Check any carpeting to make sure that it is firmly attached to the stairs. Fix any carpet that is loose or worn. Avoid having throw rugs at the top or bottom of the stairs. If you do have throw rugs, attach them to  the floor with carpet tape. Make sure that you have a light switch at the top of the stairs and the bottom of the stairs. If you do not have them, ask someone to add them for you. What else can I do to help prevent falls? Wear shoes that: Do not have high heels. Have rubber bottoms. Are comfortable and fit you well. Are closed at the toe. Do not wear sandals. If you use a stepladder: Make sure that it is fully opened. Do not climb a closed stepladder. Make sure that both sides of the stepladder are locked into place. Ask someone to hold it for you, if possible. Clearly mark and make sure that you can see: Any grab bars or handrails. First and last steps. Where the edge of each step  is. Use tools that help you move around (mobility aids) if they are needed. These include: Canes. Walkers. Scooters. Crutches. Turn on the lights when you go into a dark area. Replace any light bulbs as soon as they burn out. Set up your furniture so you have a clear path. Avoid moving your furniture around. If any of your floors are uneven, fix them. If there are any pets around you, be aware of where they are. Review your medicines with your doctor. Some medicines can make you feel dizzy. This can increase your chance of falling. Ask your doctor what other things that you can do to help prevent falls. This information is not intended to replace advice given to you by your health care provider. Make sure you discuss any questions you have with your health care provider. Document Released: 07/24/2009 Document Revised: 03/04/2016 Document Reviewed: 11/01/2014 Elsevier Interactive Patient Education  2017 [employer].

## 2021-06-30 NOTE — Progress Notes (Signed)
I connected with [name] today by telephone and verified that I am speaking with the correct person using two identifiers. Location patient: home Location provider: work Persons participating in the virtual visit: Albin Fischer, caregiver Eloy End LPN.   I discussed the limitations, risks, security and privacy concerns of performing an evaluation and management service by telephone and the availability of in person appointments. I also discussed with the patient that there may be a patient responsible charge related to this service. The patient expressed understanding and verbally consented to this telephonic visit.    Interactive audio and video telecommunications were attempted between this provider and patient, however failed, due to patient having technical difficulties OR patient did not have access to video capability.  We continued and completed visit with audio only.     Vital signs may be patient reported or missing.  Subjective:   [name] is a 87 y.o. female who presents for Medicare Annual (Subsequent) preventive examination.  Review of Systems     Cardiac Risk Factors include: advanced age (>51men, >73 women);hypertension;sedentary lifestyle     Objective:    Today's Vitals   06/30/21 1146  Weight: 102 lb (46.3 kg)  Height: 4\' 11"  (1.499 m)   Body mass index is 20.6 kg/m.  Advanced Directives 06/30/2021 04/16/2020 09/29/2019 09/24/2019 08/14/2019 06/24/2019 11/11/2018  Does Patient Have a Medical Advance Directive? No No No No No No No  Type of Advance Directive - - - - - - -  Does patient want to make changes to medical advance directive? - - - - - - -  Copy of [place] in Chart? - - - - - - -  Would patient like information on creating a medical advance directive? - - - No - Patient declined - No - Patient declined No - Patient declined    Current Medications (verified) Outpatient Encounter Medications as of 06/30/2021   Medication Sig   amLODipine (NORVASC) 5 MG tablet Take 1 tablet (5 mg total) by mouth daily. Last refill. Need office visit   calcitRIOL (ROCALTROL) 1 MCG/ML solution    carvedilol (COREG) 6.25 MG tablet Take 1 tablet (6.25 mg total) by mouth 2 (two) times daily with a meal.   FLUoxetine (PROZAC) 20 MG capsule Take 1 capsule (20 mg total) by mouth daily.   furosemide (LASIX) 20 MG tablet Take 1 tablet (20 mg total) by mouth daily.   HYDROcodone-acetaminophen (NORCO) 10-325 MG tablet Take 1 tablet by mouth every 4 (four) hours as needed for moderate pain. six tablets a day every four hours --per pain management   levothyroxine (SYNTHROID) 25 MCG tablet    LORazepam (ATIVAN) 0.5 MG tablet Take 1 tablet (0.5 mg total) by mouth 4 (four) times daily as needed for anxiety.   mirabegron ER (MYRBETRIQ) 25 MG TB24 tablet Take 1 tablet (25 mg total) by mouth daily.   mirtazapine (REMERON) 30 MG tablet Take 1 tablet (30 mg total) by mouth at bedtime.   doxycycline (VIBRA-TABS) 100 MG tablet Take 1 tablet (100 mg total) by mouth 2 (two) times daily. For 10 days. Take with full glass of water, stay upright 30 min after taking. (Patient not taking: Reported on 06/30/2021)   EPINEPHrine 0.3 mg/0.3 mL IJ SOAJ injection  (Patient not taking: Reported on 06/30/2021)   No facility-administered encounter medications on file as of 06/30/2021.    Allergies (verified) Amoxicillin, Nitrofurantoin, Ceftin [cefuroxime], Cephalosporins, Cortisone, Fluticasone, Levofloxacin, Meperidine, Morphine and related, Morphine  sulfate, Nisoldipine, Penicillin g potassium in d5w, Prednisone, Sulfa antibiotics, Cephalexin, and Ciprofloxacin   History: Past Medical History:  Diagnosis Date   Allergy    environmental   Anxiety    Chronic back pain    High cholesterol    History of urinary retention    Hypertension    Migraines    Osteoporosis    Pneumonia    Sinusitis    Past Surgical History:  Procedure Laterality Date    ABDOMINAL HYSTERECTOMY     APPENDECTOMY     BACK SURGERY     BACK SURGERY  2008   2009   BREAST BIOPSY Right 2002   neg   HEMORRHOID SURGERY     MOUTH SURGERY  2019   TONSILLECTOMY     Family History  Problem Relation Age of Onset   Breast cancer Sister 38   Breast cancer Maternal Grandmother 8   Breast cancer Sister 63   Bladder Cancer Brother    Hypertension Neg Hx    Kidney cancer Neg Hx    Social History   Socioeconomic History   Marital status: Widowed    Spouse name: Not on file   Number of children: Not on file   Years of education: Not on file   Highest education level: High school graduate  Occupational History   Occupation: retired  Tobacco Use   Smoking status: Never   Smokeless tobacco: Never  Vaping Use   Vaping Use: Never used  Substance and Sexual Activity   Alcohol use: No   Drug use: No   Sexual activity: Not Currently  Other Topics Concern   Not on file  Social History Narrative   Lives at home with daughter [name], ambulates with a walker   Social Determinants of Health   Financial Resource Strain: Low Risk    Difficulty of Paying Living Expenses: Not hard at all  Food Insecurity: No Food Insecurity   Worried About Charity fundraiser in the Last Year: Never true   [place] in the Last Year: Never true  Transportation Needs: No Transportation Needs   Lack of Transportation (Medical): No   Lack of Transportation (Non-Medical): No  Physical Activity: Inactive   Days of Exercise per Week: 0 days   Minutes of Exercise per Session: 0 min  Stress: No Stress Concern Present   Feeling of Stress : Not at all  Social Connections: Not on file    Tobacco Counseling Counseling given: Not Answered   Clinical Intake:  Pre-visit preparation completed: Yes  Pain : No/denies pain     Nutritional Status: BMI of 19-24  Normal Nutritional Risks: None Diabetes: No  How often do you need to have someone help you when you read  instructions, pamphlets, or other written materials from your doctor or pharmacy?: 1 - Never  Diabetic? no  Interpreter Needed?: No  Information entered by :: NAllen LPN   Activities of Daily Living In your present state of health, do you have any difficulty performing the following activities: 06/30/2021  Hearing? Y  Vision? Y  Difficulty concentrating or making decisions? N  Walking or climbing stairs? Y  Dressing or bathing? Y  Doing errands, shopping? Y  Preparing Food and eating ? Y  Using the Toilet? N  In the past six months, have you accidently leaked urine? N  Comment catheter  Do you have problems with loss of bowel control? N  Managing your Medications? Y  Managing your  Finances? Y  Housekeeping or managing your Housekeeping? Y  Some recent data might be hidden    Patient Care Team: Olin Hauser, DO as PCP - General (Family Medicine) Kate Sable, MD as PCP - Cardiology (Cardiology) Greg Cutter, LCSW as Social Worker (Licensed Clinical Social Worker) Vanita Ingles, RN as Case Manager (General Practice)  Indicate any recent Medical Services you may have received from other than [facility] providers in the past year (date may be approximate).     Assessment:   This is a routine wellness examination for [place].  Hearing/Vision screen Vision Screening - Comments:: No regular eye exams,  Dietary issues and exercise activities discussed: Current Exercise Habits: The patient does not participate in regular exercise at present   Goals Addressed             This Visit's Progress    Patient Stated       06/30/2021, no goals       Depression Screen PHQ 2/9 Scores 06/30/2021 06/27/2020 06/22/2019 04/06/2019 02/15/2019 01/19/2019 07/07/2018  PHQ - 2 Score 0 2 - 0 - 0 0  PHQ- 9 Score - 6 - 0 - 1 -  Exception Documentation - - Patient refusal - Patient refusal - -  Not completed - - - - - - -    Fall Risk Fall Risk  06/30/2021 06/27/2020 02/15/2019  07/07/2018 06/16/2018  Falls in the past year? 0 0 0 Yes No  Comment - - - - -  Number falls in past yr: - 0 - 1 -  Injury with Fall? - 0 - No -  Risk for fall due to : Medication side effect - - - -  Follow up Falls evaluation completed;Education provided;Falls prevention discussed Falls evaluation completed Falls evaluation completed - -    FALL RISK PREVENTION PERTAINING TO THE HOME:  Any stairs in or around the home? Yes  If so, are there any without handrails? No  Home free of loose throw rugs in walkways, pet beds, electrical cords, etc? Yes  Adequate lighting in your home to reduce risk of falls? Yes   ASSISTIVE DEVICES UTILIZED TO PREVENT FALLS:  Life alert? No  Use of a cane, walker or w/c? Yes  Grab bars in the bathroom? No  Shower chair or bench in shower? No  Elevated toilet seat or a handicapped toilet? Yes   TIMED UP AND GO:  Was the test performed? No .      Cognitive Function:        Immunizations Immunization History  Administered Date(s) Administered   Influenza, High Dose Seasonal PF 10/20/2018   Influenza-Unspecified 08/17/2012, 08/21/2013, 08/28/2015, 01/03/2018   Pneumococcal Conjugate-13 08/28/2015    TDAP status: Due, Education has been provided regarding the importance of this vaccine. Advised may receive this vaccine at local pharmacy or Health Dept. Aware to provide a copy of the vaccination record if obtained from local pharmacy or Health Dept. Verbalized acceptance and understanding.  Flu Vaccine status: Declined, Education has been provided regarding the importance of this vaccine but patient still declined. Advised may receive this vaccine at local pharmacy or Health Dept. Aware to provide a copy of the vaccination record if obtained from local pharmacy or Health Dept. Verbalized acceptance and understanding.  Pneumococcal vaccine status: Declined,  Education has been provided regarding the importance of this vaccine but patient still  declined. Advised may receive this vaccine at local pharmacy or Health Dept. Aware to provide a copy of the  vaccination record if obtained from local pharmacy or Health Dept. Verbalized acceptance and understanding.   Covid-19 vaccine status: Declined, Education has been provided regarding the importance of this vaccine but patient still declined. Advised may receive this vaccine at local pharmacy or Health Dept.or vaccine clinic. Aware to provide a copy of the vaccination record if obtained from local pharmacy or Health Dept. Verbalized acceptance and understanding.  Qualifies for Shingles Vaccine? Yes   Zostavax completed No   Shingrix Completed?: No.    Education has been provided regarding the importance of this vaccine. Patient has been advised to call insurance company to determine out of pocket expense if they have not yet received this vaccine. Advised may also receive vaccine at local pharmacy or Health Dept. Verbalized acceptance and understanding.  Screening Tests Health Maintenance  Topic Date Due   DEXA SCAN  Never done   COVID-19 Vaccine (1) 07/16/2021 (Originally 03/22/1926)   Zoster Vaccines- Shingrix (1 of 2) 09/29/2021 (Originally 09/21/1944)   INFLUENZA VACCINE  01/08/2022 (Originally 05/11/2021)   TETANUS/TDAP  06/30/2022 (Originally 09/21/1944)   HPV VACCINES  Aged Out    Health Maintenance  Health Maintenance Due  Topic Date Due   DEXA SCAN  Never done    Colorectal cancer screening: No longer required.   Mammogram status: No longer required due to age.  Bone Density status: due  Lung Cancer Screening: (Low Dose CT Chest recommended if Age 99-80 years, 30 pack-year currently smoking OR have quit w/in 15years.) does not qualify.   Lung Cancer Screening Referral: no   Additional Screening:  Hepatitis C Screening: does not qualify;   Vision Screening: Recommended annual ophthalmology exams for early detection of glaucoma and other disorders of the eye. Is the  patient up to date with their annual eye exam?  No  Who is the provider or what is the name of the office in which the patient attends annual eye exams? none If pt is not established with a provider, would they like to be referred to a provider to establish care? No .   Dental Screening: Recommended annual dental exams for proper oral hygiene  Community Resource Referral / Chronic Care Management: CRR required this visit?  No   CCM required this visit?  No      Plan:     I have personally reviewed and noted the following in the patient's chart:   Medical and social history Use of alcohol, tobacco or illicit drugs  Current medications and supplements including opioid prescriptions.  Functional ability and status Nutritional status Physical activity Advanced directives List of other physicians Hospitalizations, surgeries, and ER visits in previous 12 months Vitals Screenings to include cognitive, depression, and falls Referrals and appointments  In addition, I have reviewed and discussed with patient certain preventive protocols, quality metrics, and best practice recommendations. A written personalized care plan for preventive services as well as general preventive health recommendations were provided to patient.     Kellie Simmering, LPN   [id number]   Nurse Notes: 6 CIT not administered. Patient having trouble hearing.

## 2021-07-01 ENCOUNTER — Telehealth: Payer: Self-pay

## 2021-07-01 LAB — CULTURE, URINE COMPREHENSIVE

## 2021-07-01 NOTE — Telephone Encounter (Signed)
-----   Message from Debroah Loop, [place] sent at 07/01/2021 12:50 PM EDT ----- Please call and check on her symptoms. If she is having LOWER ABDOMINAL pain (NOT pain at her catheter insertion site), ok to treat with fosfomycin x1 dose. ----- Message ----- From: Interface, [facility] Lab Results In Sent: 06/26/2021   7:38 AM EDT To: Debroah Loop, PA-C

## 2021-07-01 NOTE — Telephone Encounter (Signed)
Attempted to reach patient, no VM set up. Attempt 1.

## 2021-07-03 ENCOUNTER — Other Ambulatory Visit: Payer: Self-pay | Admitting: *Deleted

## 2021-07-03 ENCOUNTER — Other Ambulatory Visit: Payer: Self-pay | Admitting: Physician Assistant

## 2021-07-03 MED ORDER — FOSFOMYCIN TROMETHAMINE 3 G PO PACK: 3.0000 g | PACK | Freq: Once | ORAL | 0 refills | Status: AC

## 2021-07-03 NOTE — Telephone Encounter (Signed)
Patient daughter states she is having lower abdominal pain, she its not the cathter pain . Sent medication in.

## 2021-07-07 ENCOUNTER — Other Ambulatory Visit: Payer: Self-pay | Admitting: Internal Medicine

## 2021-07-07 DIAGNOSIS — Z515 Encounter for palliative care: Secondary | ICD-10-CM

## 2021-07-07 DIAGNOSIS — F411 Generalized anxiety disorder: Secondary | ICD-10-CM

## 2021-07-07 NOTE — Telephone Encounter (Signed)
Requested medications are due for refill today.  yes  Requested medications are on the active medications list.  yes  Last refill. 05/19/2021  Future visit scheduled.   yes  Notes to clinic.  Medication not delegated.

## 2021-07-15 ENCOUNTER — Other Ambulatory Visit: Payer: Self-pay | Admitting: Family Medicine

## 2021-07-15 DIAGNOSIS — F411 Generalized anxiety disorder: Secondary | ICD-10-CM

## 2021-07-21 ENCOUNTER — Ambulatory Visit: Payer: Medicare Other | Admitting: Physician Assistant

## 2021-07-23 ENCOUNTER — Ambulatory Visit: Payer: Medicare Other | Admitting: Physician Assistant

## 2021-07-23 ENCOUNTER — Other Ambulatory Visit: Payer: Self-pay | Admitting: Family Medicine

## 2021-07-23 DIAGNOSIS — F411 Generalized anxiety disorder: Secondary | ICD-10-CM

## 2021-07-24 ENCOUNTER — Other Ambulatory Visit: Payer: Self-pay | Admitting: Family Medicine

## 2021-07-24 DIAGNOSIS — F411 Generalized anxiety disorder: Secondary | ICD-10-CM

## 2021-07-24 NOTE — Telephone Encounter (Signed)
Requested Prescriptions  Pending Prescriptions Disp Refills   mirtazapine (REMERON) 30 MG tablet [Pharmacy Med Name: MIRTAZAPINE 30 MG TABLET] 90 tablet 0    Sig: Take 1 tablet (30 mg total) by mouth at bedtime.     Psychiatry: Antidepressants - mirtazapine Failed - 07/23/2021  3:53 PM      Failed - AST in normal range and within 360 days    AST  Date Value Ref Range Status  04/16/2020 22 15 - 41 U/L Final   SGOT(AST)  Date Value Ref Range Status  05/03/2014 17 15 - 37 Unit/L Final          Failed - ALT in normal range and within 360 days    ALT  Date Value Ref Range Status  04/16/2020 12 0 - 44 U/L Final   SGPT (ALT)  Date Value Ref Range Status  05/03/2014 21 U/L Final    Comment:    14-63 NOTE: New Reference Range 04/30/14           Failed - Triglycerides in normal range and within 360 days    No results found for: TRIG, POCTRIG        Failed - Total Cholesterol in normal range and within 360 days    No results found for: CHOL, POCCHOL, CHOLTOT        Failed - WBC in normal range and within 360 days    WBC  Date Value Ref Range Status  04/16/2020 6.8 4.0 - 10.5 K/uL Final          Passed - Valid encounter within last 6 months    Recent Outpatient Visits           9 months ago COVID-19 virus infection   [facility] Olin Hauser, DO   1 year ago Generalized anxiety disorder   [facility] Olin Hauser, DO   1 year ago Acute left otitis media   [place], DO   1 year ago Acute cystitis without hematuria   [facility] [name], [place]   2 years ago [place], [name], DO       Future Appointments             In 1 week Debroah Loop, PA-C [place]   In 65 months  [facility], [facility]

## 2021-07-25 NOTE — Telephone Encounter (Signed)
last RF 07/24/21 #90 1 RF

## 2021-07-30 ENCOUNTER — Telehealth: Payer: Self-pay

## 2021-07-30 NOTE — Telephone Encounter (Signed)
9 AM: Palliative care SW outreached patient/family for monthly telephonic visit and to schedule in person visit.  Call unsuccessful. SW unable to LVM. Will outreach again at later date and continue to offer palliative care support.

## 2021-08-04 ENCOUNTER — Telehealth: Payer: Self-pay | Admitting: Family Medicine

## 2021-08-04 NOTE — Telephone Encounter (Signed)
Offered patient appt for tomorrow, patients daughter declined and has decided they will wait till scheduled appt on Thursday.

## 2021-08-04 NOTE — Telephone Encounter (Signed)
[name] called and states she is having UTI symptoms.She states she has been holding her stomach where her bladder is and Is talking out of her head. She is wanting to know if you could send in the fosfomycin (MONUROL). She states she will keep the Thursday appointment but was hoping to get the medication before the appointment.

## 2021-08-06 ENCOUNTER — Ambulatory Visit (INDEPENDENT_AMBULATORY_CARE_PROVIDER_SITE_OTHER): Payer: Medicare Other | Admitting: Physician Assistant

## 2021-08-06 ENCOUNTER — Other Ambulatory Visit: Payer: Self-pay

## 2021-08-06 VITALS — BP 115/74 | HR 75 | Temp 98.1°F | Wt 102.0 lb

## 2021-08-06 DIAGNOSIS — N3289 Other specified disorders of bladder: Secondary | ICD-10-CM | POA: Diagnosis not present

## 2021-08-06 DIAGNOSIS — R339 Retention of urine, unspecified: Secondary | ICD-10-CM | POA: Diagnosis not present

## 2021-08-06 MED ORDER — FOSFOMYCIN TROMETHAMINE 3 G PO PACK: 3.0000 g | PACK | Freq: Once | ORAL | 0 refills | Status: AC

## 2021-08-06 MED ORDER — MIRABEGRON ER 25 MG PO TB24: 25.0000 mg | ORAL_TABLET | Freq: Every day | ORAL | 11 refills | Status: DC

## 2021-08-06 NOTE — Progress Notes (Signed)
Cath Change/ Replacement  Patient is present today for a catheter change due to urinary retention.  22ml of water was removed from the balloon, a 16FR foley cath was removed with out difficulty.  Patient was cleaned and prepped in a sterile fashion with betadine. A 16 FR foley cath was replaced into the bladder no complications were noted Urine return was noted 52ml and urine was yellow in color. The balloon was filled with 57ml of sterile water. A night bag was attached for drainage.  Patient tolerated well.    Performed by: Debroah Loop, PA-C and Kerman Passey, CMA  Additional notes: Rosemarie Ax reports patient had fever at home 3 days ago, Tmax 100.37F. She has had diarrhea since and has been disoriented and reporting abdominal pain. They are concerned for possible UTI. VSS in clinic today and she is afebrile.  She has been taking Azo.  She is no longer taking Myrbetriq for bladder spasms, unclear why. Refilling Myrbetriq today.  UA today with trace glucose, 1+ blood, nitrites, and 2+ leukocyte esterase; urine microscopy with>30 WBCs/hpf, 11-30 RBCs/hpf, amorphous crystals, and many bacteria.  Treat with empiric fosfomycin in the absence of systemic symptoms today.  Vitals:   08/06/21 1534  BP: 115/74  Pulse: 75  Temp: 98.1 F (36.7 C)  Weight: 102 lb (46.3 kg)    Follow up: Return in about 4 weeks (around 09/03/2021) for Catheter exchange.    I spent 20 minutes on the day of the encounter to include pre-visit record review, face-to-face time with the patient, and post-visit ordering of tests.

## 2021-08-07 LAB — URINALYSIS, COMPLETE
Bilirubin, UA: NEGATIVE
Ketones, UA: NEGATIVE
Nitrite, UA: POSITIVE — AB
Protein,UA: NEGATIVE
Specific Gravity, UA: 1.015 (ref 1.005–1.030)
Urobilinogen, Ur: 0.2 mg/dL (ref 0.2–1.0)
pH, UA: 7 (ref 5.0–7.5)

## 2021-08-07 LAB — MICROSCOPIC EXAMINATION: WBC, UA: >30 /hpf — AB (ref 0–5)

## 2021-08-09 LAB — CULTURE, URINE COMPREHENSIVE

## 2021-08-12 ENCOUNTER — Other Ambulatory Visit: Payer: Self-pay | Admitting: Family Medicine

## 2021-08-12 DIAGNOSIS — R601 Generalized edema: Secondary | ICD-10-CM

## 2021-08-12 DIAGNOSIS — Z515 Encounter for palliative care: Secondary | ICD-10-CM

## 2021-08-12 DIAGNOSIS — F411 Generalized anxiety disorder: Secondary | ICD-10-CM

## 2021-08-12 DIAGNOSIS — I10 Essential (primary) hypertension: Secondary | ICD-10-CM

## 2021-08-13 NOTE — Telephone Encounter (Signed)
Requested medications are due for refill today.  yes  Requested medications are on the active medications list.  yes  Last refill. Ativan 07/08/2021, Amlodipine 05/15/2021, Lasix 05/15/2021  Future visit scheduled.   no  Notes to clinic.  Pt has not been seen since 09/2020.

## 2021-08-31 DIAGNOSIS — M961 Postlaminectomy syndrome, not elsewhere classified: Secondary | ICD-10-CM | POA: Diagnosis not present

## 2021-08-31 DIAGNOSIS — M5416 Radiculopathy, lumbar region: Secondary | ICD-10-CM | POA: Diagnosis not present

## 2021-08-31 DIAGNOSIS — M4126 Other idiopathic scoliosis, lumbar region: Secondary | ICD-10-CM | POA: Diagnosis not present

## 2021-09-09 ENCOUNTER — Ambulatory Visit: Payer: Medicare Other | Admitting: Physician Assistant

## 2021-09-17 ENCOUNTER — Ambulatory Visit (INDEPENDENT_AMBULATORY_CARE_PROVIDER_SITE_OTHER): Payer: Medicare Other | Admitting: Physician Assistant

## 2021-09-17 ENCOUNTER — Other Ambulatory Visit: Payer: Self-pay

## 2021-09-17 DIAGNOSIS — Z466 Encounter for fitting and adjustment of urinary device: Secondary | ICD-10-CM

## 2021-09-17 DIAGNOSIS — R339 Retention of urine, unspecified: Secondary | ICD-10-CM | POA: Diagnosis not present

## 2021-09-17 NOTE — Progress Notes (Signed)
Cath Change/ Replacement  Patient is present today for a catheter change due to urinary retention.  61ml of water was removed from the balloon, a 16FR foley cath was removed without difficulty.  Patient was cleaned and prepped in a sterile fashion with betadine. A 16 FR foley cath was replaced into the bladder no complications were noted Urine return was noted 39ml and urine was yellow in color. The balloon was filled with 46ml of sterile water. A night bag was attached for drainage.  Patient tolerated well.    Performed by: Debroah Loop, PA-C and Gordy Clement, [place]  Follow up: Return in about 4 weeks (around 10/15/2021) for Catheter exchange.

## 2021-10-02 ENCOUNTER — Emergency Department
Admission: EM | Admit: 2021-10-02 | Discharge: 2021-10-02 | Disposition: A | Discharge status: Home / Self Care | Payer: Medicare Other | Attending: Emergency Medicine | Admitting: Emergency Medicine

## 2021-10-02 ENCOUNTER — Other Ambulatory Visit: Payer: Self-pay

## 2021-10-02 ENCOUNTER — Ambulatory Visit: Payer: Self-pay | Admitting: *Deleted

## 2021-10-02 ENCOUNTER — Encounter: Payer: Self-pay | Admitting: Emergency Medicine

## 2021-10-02 DIAGNOSIS — U071 COVID-19: Secondary | ICD-10-CM | POA: Diagnosis not present

## 2021-10-02 DIAGNOSIS — Z5321 Procedure and treatment not carried out due to patient leaving prior to being seen by health care provider: Secondary | ICD-10-CM | POA: Insufficient documentation

## 2021-10-02 DIAGNOSIS — R0602 Shortness of breath: Secondary | ICD-10-CM | POA: Insufficient documentation

## 2021-10-02 DIAGNOSIS — J029 Acute pharyngitis, unspecified: Secondary | ICD-10-CM | POA: Diagnosis not present

## 2021-10-02 DIAGNOSIS — Z743 Need for continuous supervision: Secondary | ICD-10-CM | POA: Diagnosis not present

## 2021-10-02 DIAGNOSIS — R0902 Hypoxemia: Secondary | ICD-10-CM | POA: Diagnosis not present

## 2021-10-02 DIAGNOSIS — R52 Pain, unspecified: Secondary | ICD-10-CM | POA: Diagnosis not present

## 2021-10-02 DIAGNOSIS — R0689 Other abnormalities of breathing: Secondary | ICD-10-CM | POA: Diagnosis not present

## 2021-10-02 LAB — BASIC METABOLIC PANEL
Anion gap: 5 (ref 5–15)
BUN: 9 mg/dL (ref 8–23)
CO2: 32 mmol/L (ref 22–32)
Calcium: 8.5 mg/dL — ABNORMAL LOW (ref 8.9–10.3)
Chloride: 101 mmol/L (ref 98–111)
Creatinine, Ser: 0.65 mg/dL (ref 0.44–1.00)
GFR, Estimated: >60 mL/min (ref >60)
Glucose, Bld: 99 mg/dL (ref 70–99)
Potassium: 2.9 mmol/L — ABNORMAL LOW (ref 3.5–5.1)
Sodium: 138 mmol/L (ref 135–145)

## 2021-10-02 LAB — CBC
HCT: 30.4 % — ABNORMAL LOW (ref 36.0–46.0)
Hemoglobin: 9.3 g/dL — ABNORMAL LOW (ref 12.0–15.0)
MCH: 23 pg — ABNORMAL LOW (ref 26.0–34.0)
MCHC: 30.6 g/dL (ref 30.0–36.0)
MCV: 75.1 fL — ABNORMAL LOW (ref 80.0–100.0)
Platelets: 128 10*3/uL — ABNORMAL LOW (ref 150–400)
RBC: 4.05 MIL/uL (ref 3.87–5.11)
RDW: 18.3 % — ABNORMAL HIGH (ref 11.5–15.5)
WBC: 4.1 10*3/uL (ref 4.0–10.5)
nRBC: 0 % (ref 0.0–0.2)

## 2021-10-02 LAB — URINALYSIS, COMPLETE (UACMP) WITH MICROSCOPIC
Bacteria, UA: NONE SEEN
Bilirubin Urine: NEGATIVE
Glucose, UA: NEGATIVE mg/dL
Hgb urine dipstick: NEGATIVE
Ketones, ur: NEGATIVE mg/dL
Nitrite: POSITIVE — AB
Protein, ur: NEGATIVE mg/dL
Specific Gravity, Urine: 1.015 (ref 1.005–1.030)
Squamous Epithelial / HPF: NONE SEEN (ref 0–5)
pH: 7 (ref 5.0–8.0)

## 2021-10-02 LAB — TROPONIN I (HIGH SENSITIVITY): Troponin I (High Sensitivity): 11 ng/L (ref <18)

## 2021-10-02 NOTE — Telephone Encounter (Signed)
Reason for Disposition  MILD difficulty breathing (e.g., minimal/no SOB at rest, SOB with walking, pulse <100)  Answer Assessment - Initial Assessment Questions 1. COVID-19 DIAGNOSIS: "Who made your COVID-19 diagnosis?" "Was it confirmed by a positive lab test or self-test?" If not diagnosed by a doctor (or NP/PA), ask "Are there lots of cases (community spread) where you live?" Note: See public health department website, if unsure.     Daughter Lucina Enora calling in.   My mother has Covid she tested positive.   Daughter has it too.     2. COVID-19 EXPOSURE: "Was there any known exposure to COVID before the symptoms began?" CDC Definition of close contact: within 6 feet (2 meters) for a total of 15 minutes or more over a 24-hour period.      *No Answer* 3. ONSET: "When did the COVID-19 symptoms start?"      Congestion, coughing, short of breath, sore throat, a little nasal congestion.   Fever 100 day before yesterday.  No vomiting or diarrhea. 4. WORST SYMPTOM: "What is your worst symptom?" (e.g., cough, fever, shortness of breath, muscle aches)     *No Answer* 5. COUGH: "Do you have a cough?" If Yes, ask: "How bad is the cough?"       Yes has a cough with green yellow 6. FEVER: "Do you have a fever?" If Yes, ask: "What is your temperature, how was it measured, and when did it start?"     Yes loow grade 7. RESPIRATORY STATUS: "Describe your breathing?" (e.g., shortness of breath, wheezing, unable to speak)      Having shortness of breath all the time 8. BETTER-SAME-WORSE: "Are you getting better, staying the same or getting worse compared to yesterday?"  If getting worse, ask, "In what way?"     *No Answer* 9. HIGH RISK DISEASE: "Do you have any chronic medical problems?" (e.g., asthma, heart or lung disease, weak immune system, obesity, etc.)     Age, hypertension 10. VACCINE: "Have you had the COVID-19 vaccine?" If Yes, ask: "Which one, how many shots, when did you get it?"       *No  Answer* 11. BOOSTER: "Have you received your COVID-19 booster?" If Yes, ask: "Which one and when did you get it?"       *No Answer* 12. PREGNANCY: "Is there any chance you are pregnant?" "When was your last menstrual period?"       *No Answer* 13. OTHER SYMPTOMS: "Do you have any other symptoms?"  (e.g., chills, fatigue, headache, loss of smell or taste, muscle pain, sore throat)       See above 14. O2 SATURATION MONITOR:  "Do you use an oxygen saturation monitor (pulse oximeter) at home?" If Yes, ask "What is your reading (oxygen level) today?" "What is your usual oxygen saturation reading?" (e.g., 95%)       *No Answer*  Protocols used: Coronavirus (COVID-19) Diagnosed or Suspected-A-AH  Chief Complaint: Covid positive (daughter Kandis Henry calling in for mother).   Coughing a lot, sore throat, short of breath at rest Symptoms: shortness of breath.  Having to sit propped up on pillows, sore throat, coughing a lot Frequency: constantly Pertinent Negatives: Patient denies no high risk factors except her age of 78 Disposition: [x] ED /[] Urgent Care (no appt availability in office) / [] Appointment(In office/virtual)/ []  [facility]/ [] Home Care/ [] Refused Recommended Disposition  Additional Notes: I have referred her to the ED because susan also has Covid and her mother is too weak  to go anywhere but does need to be evaluated.   They have agreed to call 911.   I have sent my notes to [place] for Dr. Parks Ranger.

## 2021-10-02 NOTE — ED Triage Notes (Signed)
Pt in via EMS from home with c/o sob and sore throat. Pt daughter tested + for covid last week and now she has started with sx's.

## 2021-10-02 NOTE — ED Notes (Signed)
Pt called x's 3, no response ?

## 2021-10-02 NOTE — ED Triage Notes (Signed)
Pt called, no response

## 2021-10-02 NOTE — ED Triage Notes (Signed)
Pt reports her bladder hurts and she has covid. Pt constantly yells out "my bladder hurts"

## 2021-10-04 DIAGNOSIS — R0989 Other specified symptoms and signs involving the circulatory and respiratory systems: Secondary | ICD-10-CM | POA: Diagnosis not present

## 2021-10-04 DIAGNOSIS — M791 Myalgia, unspecified site: Secondary | ICD-10-CM | POA: Diagnosis not present

## 2021-10-04 DIAGNOSIS — Z882 Allergy status to sulfonamides status: Secondary | ICD-10-CM | POA: Diagnosis not present

## 2021-10-04 DIAGNOSIS — Z885 Allergy status to narcotic agent status: Secondary | ICD-10-CM | POA: Diagnosis not present

## 2021-10-04 DIAGNOSIS — Z881 Allergy status to other antibiotic agents status: Secondary | ICD-10-CM | POA: Diagnosis not present

## 2021-10-04 DIAGNOSIS — R059 Cough, unspecified: Secondary | ICD-10-CM | POA: Diagnosis not present

## 2021-10-04 DIAGNOSIS — U071 COVID-19: Secondary | ICD-10-CM | POA: Diagnosis not present

## 2021-10-04 DIAGNOSIS — R509 Fever, unspecified: Secondary | ICD-10-CM | POA: Diagnosis not present

## 2021-10-04 DIAGNOSIS — F32A Depression, unspecified: Secondary | ICD-10-CM | POA: Diagnosis not present

## 2021-10-04 DIAGNOSIS — Z7982 Long term (current) use of aspirin: Secondary | ICD-10-CM | POA: Diagnosis not present

## 2021-10-04 DIAGNOSIS — Z880 Allergy status to penicillin: Secondary | ICD-10-CM | POA: Diagnosis not present

## 2021-10-04 DIAGNOSIS — R0602 Shortness of breath: Secondary | ICD-10-CM | POA: Diagnosis not present

## 2021-10-04 DIAGNOSIS — R0600 Dyspnea, unspecified: Secondary | ICD-10-CM | POA: Diagnosis not present

## 2021-10-04 DIAGNOSIS — I10 Essential (primary) hypertension: Secondary | ICD-10-CM | POA: Diagnosis not present

## 2021-10-06 ENCOUNTER — Other Ambulatory Visit: Payer: Self-pay | Admitting: Family Medicine

## 2021-10-06 DIAGNOSIS — Z515 Encounter for palliative care: Secondary | ICD-10-CM

## 2021-10-06 DIAGNOSIS — F411 Generalized anxiety disorder: Secondary | ICD-10-CM

## 2021-10-06 NOTE — Telephone Encounter (Signed)
Requested medication (s) are due for refill today: yes  Requested medication (s) are on the active medication list: yes  Last refill:  08/23/21 #120  Future visit scheduled: yes  Notes to clinic:  Please review for refill. Refill not delegated per protocol    Requested Prescriptions  Pending Prescriptions Disp Refills   LORazepam (ATIVAN) 0.5 MG tablet [Pharmacy Med Name: LORAZEPAM 0.5 MG TABLET] 120 tablet 0    Sig: Take 1 tablet (0.5 mg total) by mouth 4 (four) times daily as needed for anxiety.     Not Delegated - Psychiatry:  Anxiolytics/Hypnotics Failed - 10/06/2021  9:49 AM      Failed - This refill cannot be delegated      Failed - Urine Drug Screen completed in last 360 days      Passed - Valid encounter within last 6 months    Recent Outpatient Visits           12 months ago COVID-19 virus infection   [place], DO   1 year ago Generalized anxiety disorder   [place], DO   1 year ago Acute left otitis media   [place], DO   2 years ago Acute cystitis without hematuria   [facility] [name], [place]   2 years ago [place], [name], DO       Future Appointments             In 2 weeks Debroah Loop, [place]   In 9 months  [facility], [facility]

## 2021-10-13 ENCOUNTER — Other Ambulatory Visit: Payer: Self-pay | Admitting: Family Medicine

## 2021-10-13 DIAGNOSIS — F411 Generalized anxiety disorder: Secondary | ICD-10-CM

## 2021-10-14 NOTE — Telephone Encounter (Signed)
Requested Prescriptions  Pending Prescriptions Disp Refills   FLUoxetine (PROZAC) 20 MG capsule [Pharmacy Med Name: FLUOXETINE HCL 20 MG CAPSULE] 90 capsule 0    Sig: Take 1 capsule (20 mg total) by mouth daily.     Psychiatry:  Antidepressants - SSRI Passed - 10/13/2021 11:36 AM      Passed - Valid encounter within last 6 months    Recent Outpatient Visits          1 year ago COVID-19 virus infection   [place], DO   1 year ago Generalized anxiety disorder   [place], DO   1 year ago Acute left otitis media   [place], DO   2 years ago Acute cystitis without hematuria   [place], [place]   2 years ago [place], [name], DO      Future Appointments            In 6 days Debroah Loop, [place]   In 8 months  [facility], [place]

## 2021-10-20 ENCOUNTER — Ambulatory Visit: Payer: Medicare Other

## 2021-10-22 ENCOUNTER — Other Ambulatory Visit: Payer: Medicare Other

## 2021-10-22 ENCOUNTER — Other Ambulatory Visit: Payer: Self-pay

## 2021-10-22 ENCOUNTER — Telehealth: Payer: Self-pay

## 2021-10-22 DIAGNOSIS — Z515 Encounter for palliative care: Secondary | ICD-10-CM

## 2021-10-22 NOTE — Progress Notes (Signed)
PATIENT NAME: [name] DOB: 10-22-1924 MRN: [id number]  PRIMARY CARE PROVIDER: Olin Hauser, DO  RESPONSIBLE PARTY:  Acct ID - Guarantor Home Phone Work Phone Relationship Acct Type  [id number] TRUE, [place]* [phone]  Self P/F     [address] [place], [place] [address]   Due to the COVID-19 crisis, this visit was done via telemedicine from my office and it was initiated and consent by this patient and or family.  I connected with  [name] OR PROXY on 10/22/21 by telephone and verified that I am speaking with the correct person using two identifiers.   I discussed the limitations of evaluation and management by telemedicine. The patient expressed understanding and agreed to proceed.   PLAN OF CARE and INTERVENTIONS:               1.  GOALS OF CARE/ ADVANCE CARE PLANNING:  Remain home under the care of daughters and private caregivers.               2.  PATIENT/CAREGIVER EDUCATION:  Palliative Care Support.                4. PERSONAL EMERGENCY PLAN:  Activate 911 for emergencies.               5.  DISEASE STATUS:  Connected with daughter [name] by telephone.  COVID 19:  Patient was seen in the ED last month due to COVID 19 dx.  Daughter reports there have been no complications related to this dx.   Mobility:  Patient continues to use a rolling walker to move around the home.  No falls were reported by daughter.  Urinary Retention:  Patient continues with a foley cath and is followed by urology for cath changes.  Daughter shared there was discussion on a suprapubic cath but patient has elected not to proceed.   Caregiver Fatigue:  [name] shares patient seems to be doing well overall.  Her and her sister are feeling fatigued from caregiving.  They do have a Charity fundraiser in place during the day while they work.  Daughter shares patient can become "hysterical over things" but did not elaborate on anything specific.   Daughters are having to wake up during the  night/early morning to administer medications to patient.  Sometimes patient will not go back to sleep.  Offered a home visit with this RN and connecting with NP virtually.  Daughter will speak with her sister to work out a time for this to happen.  [name] will call back to schedule.    HISTORY OF PRESENT ILLNESS:   88 year old female with a hx of urinary retention and HTN.  Patient is being followed by palliative care monthly and prn.  CODE STATUS: Full ADVANCED DIRECTIVES: No MOST FORM: No PPS: 50%     Lorenza Burton, RN

## 2021-10-22 NOTE — Telephone Encounter (Signed)
1025 am.  Phone call made to daughter [name] to follow up on patient status.  No answer and VM is not set up.  Phone call made to patient's home.  Caregiver advised that daughter would be home tomorrow and requested a callback at that time.

## 2021-10-27 ENCOUNTER — Ambulatory Visit: Payer: Medicare Other | Admitting: *Deleted

## 2021-10-27 ENCOUNTER — Other Ambulatory Visit: Payer: Self-pay

## 2021-10-27 DIAGNOSIS — N390 Urinary tract infection, site not specified: Secondary | ICD-10-CM

## 2021-10-27 NOTE — Progress Notes (Signed)
Cath Change/ Replacement  Patient is present today for a catheter change due to urinary retention.  48ml of water was removed from the balloon, a 16FR foley cath was removed with out difficulty.  Patient was cleaned and prepped in a sterile fashion with betadine. A 16 FR foley cath was replaced into the bladder no complications were noted Urine return was noted 76ml and urine was orange in color. The balloon was filled with 72ml of sterile water. A leg bag was attached for drainage.  A night bag was also given to the patient and patient was given instruction on how to change from one bag to another. Patient was given proper instruction on catheter care.    Performed by: Gaspar Cola CMA  Follow up: 1 month . I got a ua sample on patient per [facility] .

## 2021-11-02 ENCOUNTER — Telehealth: Payer: Self-pay | Admitting: Urology

## 2021-11-02 ENCOUNTER — Other Ambulatory Visit: Payer: Self-pay | Admitting: *Deleted

## 2021-11-02 LAB — CULTURE, URINE COMPREHENSIVE

## 2021-11-02 MED ORDER — MONUROL 3 G PO PACK: 3.0000 g | PACK | Freq: Once | ORAL | 0 refills | Status: AC

## 2021-11-02 NOTE — Telephone Encounter (Signed)
Pts daughter called inquiring ab culture. looked in chart, found result note. Pt does not have fosfomycin at home. Would like it called into [facility] if possible.  They want brand name Monorul.

## 2021-11-02 NOTE — Telephone Encounter (Addendum)
Notified caregiver  as instructed,

## 2021-11-09 ENCOUNTER — Other Ambulatory Visit: Payer: Self-pay | Admitting: Family Medicine

## 2021-11-09 DIAGNOSIS — I10 Essential (primary) hypertension: Secondary | ICD-10-CM

## 2021-11-09 DIAGNOSIS — R601 Generalized edema: Secondary | ICD-10-CM

## 2021-11-09 NOTE — Telephone Encounter (Signed)
Requested medications are due for refill today.  yes  Requested medications are on the active medications list.  yes  Last refill. 08/13/2021 for both  Future visit scheduled.   Yes - in 7 months  Notes to clinic.  Pt last seen in office 11/11/2020.    Requested Prescriptions  Pending Prescriptions Disp Refills   furosemide (LASIX) 20 MG tablet [Pharmacy Med Name: FUROSEMIDE 20 MG TABLET] 90 tablet 0    Sig: Take 1 tablet (20 mg total) by mouth daily.     Cardiovascular:  Diuretics - Loop Failed - 11/09/2021  6:05 PM      Failed - K in normal range and within 360 days    Potassium  Date Value Ref Range Status  10/02/2021 2.9 (L) 3.5 - 5.1 mmol/L Final  08/04/2014 3.6 3.5 - 5.1 mmol/L Final          Failed - Ca in normal range and within 360 days    Calcium  Date Value Ref Range Status  10/02/2021 8.5 (L) 8.9 - 10.3 mg/dL Final   Calcium, Total  Date Value Ref Range Status  08/04/2014 9.2 8.5 - 10.1 mg/dL Final          Passed - Na in normal range and within 360 days    Sodium  Date Value Ref Range Status  10/02/2021 138 135 - 145 mmol/L Final  08/04/2014 140 136 - 145 mmol/L Final          Passed - Cr in normal range and within 360 days    Creatinine  Date Value Ref Range Status  08/04/2014 0.98 0.60 - 1.30 mg/dL Final   Creatinine, Ser  Date Value Ref Range Status  10/02/2021 0.65 0.44 - 1.00 mg/dL Final          Passed - Last BP in normal range    BP Readings from Last 1 Encounters:  10/02/21 130/89          Passed - Valid encounter within last 6 months    Recent Outpatient Visits           1 year ago COVID-19 virus infection   [place], DO   1 year ago Generalized anxiety disorder   [place], DO   2 years ago Acute left otitis media   [place], DO   2 years ago Acute cystitis without hematuria   [facility] [name], [place]   2 years ago [place], DO       Future Appointments             In 3 weeks McGowan, Hunt Oris, PA-C [place]   In 7 months  [facility], [facility]             amLODipine (NORVASC) 5 MG tablet [Pharmacy Med Name: AMLODIPINE BESYLATE 5 MG TAB] 90 tablet 0    Sig: Take 1 tablet (5 mg total) by mouth daily. NEED APPT FOR FURTHER REFILL PER MD     Cardiovascular:  Calcium Channel Blockers Passed - 11/09/2021  6:05 PM      Passed - Last BP in normal range    BP Readings from Last 1 Encounters:  10/02/21 130/89          Passed - Valid encounter within last 6 months    Recent  Outpatient Visits           1 year ago COVID-19 virus infection   [facility] [place], [name], DO   1 year ago Generalized anxiety disorder   [place], DO   2 years ago Acute left otitis media   [place], DO   2 years ago Acute cystitis without hematuria   [place], [place]   2 years ago [place], DO       Future Appointments             In 3 weeks McGowan, Shannon A, [place]   In 7 months  [facility], [place]

## 2021-12-01 ENCOUNTER — Ambulatory Visit: Payer: Medicare Other | Admitting: Urology

## 2021-12-14 ENCOUNTER — Other Ambulatory Visit: Payer: Self-pay

## 2021-12-14 ENCOUNTER — Encounter: Payer: Self-pay | Admitting: Family Medicine

## 2021-12-14 ENCOUNTER — Ambulatory Visit (INDEPENDENT_AMBULATORY_CARE_PROVIDER_SITE_OTHER): Payer: Medicare Other | Admitting: Family Medicine

## 2021-12-14 VITALS — BP 83/62 | HR 77 | Ht 59.0 in | Wt 97.2 lb

## 2021-12-14 DIAGNOSIS — H6122 Impacted cerumen, left ear: Secondary | ICD-10-CM

## 2021-12-14 DIAGNOSIS — K439 Ventral hernia without obstruction or gangrene: Secondary | ICD-10-CM | POA: Diagnosis not present

## 2021-12-14 DIAGNOSIS — H6692 Otitis media, unspecified, left ear: Secondary | ICD-10-CM | POA: Diagnosis not present

## 2021-12-14 MED ORDER — DOXYCYCLINE HYCLATE 100 MG PO TABS: 100.0000 mg | ORAL_TABLET | Freq: Two times a day (BID) | ORAL | 0 refills | Status: DC

## 2021-12-14 NOTE — Progress Notes (Signed)
? ?Subjective:  ? ? Patient ID: [name], female    DOB: 17-Oct-1924, 88 y.o.   MRN: [id number] ? ?[name] is a 88 y.o. female presenting on 12/14/2021 for Ear Drainage ? ?Here with daughter, caregiver. Patient very hard of hearing ? ?HPI ? ?Left Ear Cerumen Impaction ?Also left ear chronic pain, cerumen built up. Already has chronic hearing loss, R>L has hearing aid in.  ? ?Right Abdominal wall hernia ?Without herniation at this time, but has bulging fullness with pain. ? ?Currently has Palliative care evaluating her. ? ?Thyroid cancer s/p surgeyr, has ENT follow up ? ? ? ?Depression screen [facility] 2/9 12/14/2021 06/30/2021 06/27/2020  ?Decreased Interest 0 0 2  ?Down, Depressed, Hopeless 0 0 0  ?PHQ - 2 Score 0 0 2  ?Altered sleeping 0 - 0  ?Tired, decreased energy 0 - 2  ?Change in appetite 0 - 2  ?Feeling bad or failure about yourself  0 - 0  ?Trouble concentrating 0 - 0  ?Moving slowly or fidgety/restless 0 - 0  ?Suicidal thoughts 0 - 0  ?PHQ-9 Score 0 - 6  ?Difficult doing work/chores Not difficult at all - Not difficult at all  ?Some recent data might be hidden  ? ? ?Social History  ? ?Tobacco Use  ? Smoking status: Never  ? Smokeless tobacco: Never  ?Vaping Use  ? Vaping Use: Never used  ?Substance Use Topics  ? Alcohol use: No  ? Drug use: No  ? ? ?Review of Systems ?Per HPI unless specifically indicated above ? ?   ?Objective:  ?  ?BP (!) 83/62   Pulse 77   Ht '4\' 11"'$  (1.499 m)   Wt 97 lb 3.2 oz (44.1 kg)   BMI 19.63 kg/m?   ?Wt Readings from Last 3 Encounters:  ?12/14/21 97 lb 3.2 oz (44.1 kg)  ?10/02/21 101 lb 6.6 oz (46 kg)  ?08/06/21 102 lb (46.3 kg)  ?  ?Physical Exam ?Vitals and nursing note reviewed.  ?Constitutional:   ?   General: She is not in acute distress. ?   Appearance: Normal appearance. She is well-developed. She is not diaphoretic.  ?   Comments: Chronically ill appearing, uncomfortable L ear pain, in wheelchair  ?HENT:  ?   Head: Normocephalic and atraumatic.  ?   Right Ear:  There is no impacted cerumen.  ?   Left Ear: There is impacted cerumen.  ?   Ears:  ?   Comments: Very hard of hearing, has R ear hearing aid ?Eyes:  ?   General:     ?   Right eye: No discharge.     ?   Left eye: No discharge.  ?   Conjunctiva/sclera: Conjunctivae normal.  ?Cardiovascular:  ?   Rate and Rhythm: Normal rate.  ?Pulmonary:  ?   Effort: Pulmonary effort is normal.  ?Skin: ?   General: Skin is warm and dry.  ?   Findings: No erythema or rash.  ?Neurological:  ?   Mental Status: She is alert and oriented to person, place, and time.  ?Psychiatric:     ?   Mood and Affect: Mood normal.     ?   Behavior: Behavior normal.     ?   Thought Content: Thought content normal.  ?   Comments: Well groomed, good eye contact, normal speech and thoughts  ? ?Results for orders placed or performed in visit on 10/27/21  ?CULTURE, URINE COMPREHENSIVE  ? Specimen: Urine  ?  UR  ?Result Value Ref Range  ? Urine Culture, Comprehensive Final report (A)   ? Organism ID, Bacteria Enterococcus faecalis (A)   ? Organism ID, Bacteria Escherichia coli (A)   ? ANTIMICROBIAL SUSCEPTIBILITY Comment   ? ?   ?Assessment & Plan:  ? ?Problem List Items Addressed This Visit   ?None ?Visit Diagnoses   ? ? Left ear impacted cerumen    -  Primary  ? Abdominal wall hernia      ? Chronic infection of left ear      ? Relevant Medications  ? doxycycline (VIBRA-TABS) 100 MG tablet  ? ?  ?  ?Start taking Doxycycline antibiotic '100mg'$  twice daily for 10 days. Take with full glass of water and stay upright for at least 30 min after taking, may be seated or standing, but should NOT lay down. This is just a safety precaution, if this medicine does not go all the way down throat well it could cause some burning discomfort to throat and esophagus. ? ?Murine or Debrox ear drops wax removal L ear. Use instructions on bottle up to 1-2 weeks to soften and dislodge ? ?May need to call up ENT to get full cleaning that is better tolerated ? ?Abdominal Binder or  truss for hernia - does not appear to be actually herniation out but it is putting direct pressure. ? ?Attempted lavage unsuccessful today ? ?Meds ordered this encounter  ?Medications  ? doxycycline (VIBRA-TABS) 100 MG tablet  ?  Sig: Take 1 tablet (100 mg total) by mouth 2 (two) times daily. For 10 days. Take with full glass of water, stay upright 30 min after taking.  ?  Dispense:  20 tablet  ?  Refill:  0  ? ? ? ?Follow up plan: ?Return if symptoms worsen or fail to improve. ? ?[name], DO ?[facility] ?[facility] ?12/14/2021, 3:02 PM ?

## 2021-12-14 NOTE — Patient Instructions (Addendum)
Thank you for coming to the office today. ? ?Start taking Doxycycline antibiotic '100mg'$  twice daily for 10 days. Take with full glass of water and stay upright for at least 30 min after taking, may be seated or standing, but should NOT lay down. This is just a safety precaution, if this medicine does not go all the way down throat well it could cause some burning discomfort to throat and esophagus. ? ?Murine or Debrox ear drops wax removal L ear. Use instructions on bottle up to 1-2 weeks to soften and dislodge ? ?May need to call up ENT to get full cleaning that is better tolerated ? ?Abdominal Binder or truss for hernia - does not appear to be actually herniation out but it is putting direct pressure. ? ?Please schedule a Follow-up Appointment to: Return if symptoms worsen or fail to improve. ? ?If you have any other questions or concerns, please feel free to call the office or send a message through [place]. You may also schedule an earlier appointment if necessary. ? ?Additionally, you may be receiving a survey about your experience at our office within a few days to 1 week by e-mail or mail. We value your feedback. ? ?[name], DO ?[place] ?

## 2021-12-18 NOTE — Progress Notes (Incomplete)
Cath Change/ Replacement ? ?Patient is present today for a catheter change due to urinary retention.  ***ml of water was removed from the balloon, a ***FR foley cath was removed with out difficulty.  Patient was cleaned and prepped in a sterile fashion with betadine. A *** FR foley cath was replaced into the bladder {dnt complications:[id number]} Urine return was noted ***ml and urine was *** in color. The balloon was filled with 71m of sterile water. A *** bag was attached for drainage.  A night bag was also given to the patient and patient was given instruction on how to change from one bag to another. Patient was given proper instruction on catheter care.   ? ?Performed by: *** ? ?Follow up: *** ? ?I,Kailey Littlejohn,acting as a scribe for [employer] PA-C.,have documented all relevant documentation on the behalf of SHANNON MCGOWAN, PA-C,as directed by  [facility] PA-C while in the presence of [place] PA-C. ? ?

## 2021-12-21 ENCOUNTER — Ambulatory Visit: Payer: Medicare Other | Admitting: Urology

## 2021-12-27 NOTE — Progress Notes (Signed)
Cath Change/ Replacement ? ?Patient is present today for a catheter change due to urinary retention.  9 ml of water was removed from the balloon, a 16 FR foley cath was removed with out difficulty.  Patient was cleaned and prepped in a sterile fashion with betadine. A 16 FR foley cath was replaced into the bladder no complications were noted Urine return was noted 50 ml and urine was yellow in color. The balloon was filled with 75m of sterile water. A  bag was attached for drainage.  A night bag was also given to the patient and patient was given instruction on how to change from one bag to another. Patient was given proper instruction on catheter care.   ? ?Performed by: SZara Council PA-C  ? ?Follow up: Return in about 1 month (around 01/28/2022) for Foley exchange . ? ? ?I,Saleah Rishel,acting as a scribe for [employer] PA-C.,have documented all relevant documentation on the behalf of [place] PA-C,as directed by  [facility] PA-C while in the presence of [place] PA-C. ? ?I have reviewed the above documentation for accuracy and completeness, and I agree with the above.   ? ?SZara Council PA-C  ? ?

## 2021-12-28 ENCOUNTER — Encounter: Payer: Self-pay | Admitting: Urology

## 2021-12-28 ENCOUNTER — Ambulatory Visit (INDEPENDENT_AMBULATORY_CARE_PROVIDER_SITE_OTHER): Payer: Medicare Other | Admitting: Urology

## 2021-12-28 ENCOUNTER — Other Ambulatory Visit: Payer: Self-pay

## 2021-12-28 VITALS — Ht 59.0 in | Wt 97.0 lb

## 2021-12-28 DIAGNOSIS — Z466 Encounter for fitting and adjustment of urinary device: Secondary | ICD-10-CM

## 2021-12-29 ENCOUNTER — Other Ambulatory Visit: Payer: Self-pay | Admitting: Family Medicine

## 2021-12-29 DIAGNOSIS — F411 Generalized anxiety disorder: Secondary | ICD-10-CM

## 2021-12-31 NOTE — Telephone Encounter (Signed)
Requested medication (s) are due for refill today:   Yes ? ?Requested medication (s) are on the active medication list:   Yes ? ?Future visit scheduled:   Yes in 6 months ? ? ?Last ordered: 07/24/2021 #90, 1 refill ? ?Returned because wasn't sure if Dr. Parks Ranger needs/wants to see her prior to refilling this.     ? ?Requested Prescriptions  ?Pending Prescriptions Disp Refills  ? mirtazapine (REMERON) 30 MG tablet [Pharmacy Med Name: MIRTAZAPINE 30 MG TABLET] 90 tablet 0  ?  Sig: Take 1 tablet (30 mg total) by mouth at bedtime.  ?  ? Psychiatry: Antidepressants - mirtazapine Passed - 12/29/2021 12:37 PM  ?  ?  Passed - Valid encounter within last 6 months  ?  Recent Outpatient Visits   ? ?      ? 2 weeks ago Left ear impacted cerumen  ? [place], DO  ? 1 year ago COVID-19 virus infection  ? [place], DO  ? 1 year ago Generalized anxiety disorder  ? [place], DO  ? 2 years ago Acute left otitis media  ? [place], DO  ? 2 years ago Acute cystitis without hematuria  ? [facility], [place], [place]  ? ?  ?  ?Future Appointments   ? ?        ? In 4 weeks McGowan, Gordan Payment [place]  ? In 6 months  [facility], [place]  ? ?  ? ?  ?  ?  ? ?

## 2022-01-04 ENCOUNTER — Other Ambulatory Visit: Payer: Self-pay | Admitting: Family Medicine

## 2022-01-04 DIAGNOSIS — Z515 Encounter for palliative care: Secondary | ICD-10-CM

## 2022-01-04 DIAGNOSIS — F411 Generalized anxiety disorder: Secondary | ICD-10-CM

## 2022-01-06 NOTE — Telephone Encounter (Signed)
Requested medication (s) are due for refill today: yes ? ?Requested medication (s) are on the active medication list: yes ? ?Last refill:  10/07/21 #120/2 ? ?Future visit scheduled: no ? ?Notes to clinic:  Unable to refill per protocol, cannot delegate. ? ? ? ?  ?Requested Prescriptions  ?Pending Prescriptions Disp Refills  ? LORazepam (ATIVAN) 0.5 MG tablet [Pharmacy Med Name: LORAZEPAM 0.5 MG TABLET] 120 tablet 0  ?  Sig: Take 1 tablet (0.5 mg total) by mouth 4 (four) times daily as needed for anxiety.  ?  ? Not Delegated - Psychiatry: Anxiolytics/Hypnotics 2 Failed - 01/04/2022  6:17 PM  ?  ?  Failed - This refill cannot be delegated  ?  ?  Failed - Urine Drug Screen completed in last 360 days  ?  ?  Passed - Patient is not pregnant  ?  ?  Passed - Valid encounter within last 6 months  ?  Recent Outpatient Visits   ? ?      ? 3 weeks ago Left ear impacted cerumen  ? [place], DO  ? 1 year ago COVID-19 virus infection  ? [place], DO  ? 1 year ago Generalized anxiety disorder  ? [place], DO  ? 2 years ago Acute left otitis media  ? [place], DO  ? 2 years ago Acute cystitis without hematuria  ? [facility] [place], [place], [place]  ? ?  ?  ?Future Appointments   ? ?        ? In 3 weeks McGowan, Gordan Payment [place]  ? In 6 months  [facility], [place]  ? ?  ? ?  ?  ?  ? ?

## 2022-01-07 ENCOUNTER — Other Ambulatory Visit: Payer: Self-pay | Admitting: Family Medicine

## 2022-01-07 DIAGNOSIS — F411 Generalized anxiety disorder: Secondary | ICD-10-CM

## 2022-01-08 NOTE — Telephone Encounter (Signed)
Requested Prescriptions  ?Pending Prescriptions Disp Refills  ?? FLUoxetine (PROZAC) 20 MG capsule [Pharmacy Med Name: FLUOXETINE HCL 20 MG CAPSULE] 90 capsule 0  ?  Sig: Take 1 capsule (20 mg total) by mouth daily.  ?  ? Psychiatry:  Antidepressants - SSRI Passed - 01/07/2022 12:07 PM  ?  ?  Passed - Valid encounter within last 6 months  ?  Recent Outpatient Visits   ?      ? 3 weeks ago Left ear impacted cerumen  ? [place], DO  ? 1 year ago COVID-19 virus infection  ? [place], DO  ? 1 year ago Generalized anxiety disorder  ? [place], DO  ? 2 years ago Acute left otitis media  ? [place], DO  ? 2 years ago Acute cystitis without hematuria  ? [facility] [place], [place], [place]  ?  ?  ?Future Appointments   ?        ? In 2 weeks McGowan, Gordan Payment [place]  ? In 5 months  [facility], [place]  ?  ? ?  ?  ?  ? ?

## 2022-01-21 ENCOUNTER — Other Ambulatory Visit: Payer: Self-pay | Admitting: Family Medicine

## 2022-01-21 DIAGNOSIS — F411 Generalized anxiety disorder: Secondary | ICD-10-CM

## 2022-01-22 NOTE — Telephone Encounter (Signed)
Requested Prescriptions  ?Pending Prescriptions Disp Refills  ?? mirtazapine (REMERON) 30 MG tablet [Pharmacy Med Name: MIRTAZAPINE 30 MG TABLET] 30 tablet 0  ?  Sig: Take 1 tablet (30 mg total) by mouth at bedtime.  ?  ? Psychiatry: Antidepressants - mirtazapine Passed - 01/21/2022  6:16 PM  ?  ?  Passed - Valid encounter within last 6 months  ?  Recent Outpatient Visits   ?      ? 1 month ago Left ear impacted cerumen  ? [place], DO  ? 1 year ago COVID-19 virus infection  ? [place], DO  ? 1 year ago Generalized anxiety disorder  ? [place], DO  ? 2 years ago Acute left otitis media  ? [place], DO  ? 2 years ago Acute cystitis without hematuria  ? [facility] [place], [place], [place]  ?  ?  ?Future Appointments   ?        ? In 6 days McGowan, Gordan Payment [place]  ? In 5 months  [facility], [place]  ?  ? ?  ?  ?  ? ?

## 2022-01-25 ENCOUNTER — Telehealth: Payer: Self-pay

## 2022-01-25 NOTE — Progress Notes (Incomplete)
[name] Kitchen04/17/23 ?8:00 PM  ? ?[name] ?[birth year] ?[id number] ? ?Referring provider:  ?Olin Hauser, DO ?[address] ?[place],  [place] [address] ?No chief complaint on file. ? ? ?Urological history  ? ? ? ?HPI: ?[name] is a 88 y.o.female ? ? ? ? ? ?PMH: ?Past Medical History:  ?Diagnosis Date  ? Allergy   ? environmental  ? Anxiety   ? Chronic back pain   ? High cholesterol   ? History of urinary retention   ? Hypertension   ? Migraines   ? Osteoporosis   ? Pneumonia   ? Sinusitis   ? ? ?Surgical History: ?Past Surgical History:  ?Procedure Laterality Date  ? ABDOMINAL HYSTERECTOMY    ? APPENDECTOMY    ? BACK SURGERY    ? BACK SURGERY  2008  ? 2009  ? BREAST BIOPSY Right 2002  ? neg  ? HEMORRHOID SURGERY    ? MOUTH SURGERY  2019  ? TONSILLECTOMY    ? ? ?Home Medications:  ?Allergies as of 01/28/2022   ? ?   Reactions  ? Amoxicillin Itching, Swelling, Rash  ? Anaphylactoid reaction occurred around the time that the patient took both Amoxicillin and Cipro. ED notes state this reaction was to the Amoxicillin, patient's family reports it was to the Cipro.  ? Nitrofurantoin   ? swelling  ? Ceftin [cefuroxime]   ? Cephalosporins   ? Cortisone Other (See Comments)  ? Headache  ? Fluticasone Swelling  ? Facial  ? Levofloxacin Nausea And Vomiting  ? Meperidine Other (See Comments)  ? Morphine And Related Other (See Comments)  ? headache  ? Morphine Sulfate   ? Nisoldipine   ? Penicillin G Potassium In D5w   ? Prednisone Other (See Comments)  ? Headache  ? Sulfa Antibiotics Nausea Only  ? Cephalexin Swelling  ? Lip swelling with Keflex prescribed in January 2021. Family administered benadryl.  ? Ciprofloxacin Rash  ? ?  ? ?  ?Medication List  ?  ? ?  ? Accurate as of January 25, 2022  8:00 PM. If you have any questions, ask your nurse or doctor.  ?  ?  ? ?  ? ?amLODipine 5 MG tablet ?Commonly known as: NORVASC ?Take 1 tablet (5 mg total) by mouth daily. NEED APPT FOR FURTHER REFILL PER MD ?  ?calcitRIOL 1 MCG/ML  solution ?Commonly known as: ROCALTROL ?  ?carvedilol 6.25 MG tablet ?Commonly known as: COREG ?Take 1 tablet (6.25 mg total) by mouth 2 (two) times daily with a meal. ?  ?FLUoxetine 20 MG capsule ?Commonly known as: PROZAC ?Take 1 capsule (20 mg total) by mouth daily. ?  ?furosemide 20 MG tablet ?Commonly known as: LASIX ?Take 1 tablet (20 mg total) by mouth daily. ?  ?HYDROcodone-acetaminophen 10-325 MG tablet ?Commonly known as: NORCO ?Take 1 tablet by mouth every 4 (four) hours as needed for moderate pain. six tablets a day every four hours --per pain management ?  ?levothyroxine 25 MCG tablet ?Commonly known as: SYNTHROID ?  ?LORazepam 0.5 MG tablet ?Commonly known as: ATIVAN ?Take 1 tablet (0.5 mg total) by mouth 4 (four) times daily as needed for anxiety. ?  ?mirtazapine 30 MG tablet ?Commonly known as: REMERON ?Take 1 tablet (30 mg total) by mouth at bedtime. ?  ? ?  ? ? ?Allergies:  ?Allergies  ?Allergen Reactions  ? Amoxicillin Itching, Swelling and Rash  ?  Anaphylactoid reaction occurred around the time that the patient  took both Amoxicillin and Cipro. ED notes state this reaction was to the Amoxicillin, patient's family reports it was to the Cipro.  ? Nitrofurantoin   ?  swelling  ? Ceftin [Cefuroxime]   ? Cephalosporins   ? Cortisone Other (See Comments)  ?  Headache ?  ? Fluticasone Swelling  ?  Facial  ? Levofloxacin Nausea And Vomiting  ? Meperidine Other (See Comments)  ? Morphine And Related Other (See Comments)  ?  headache  ? Morphine Sulfate   ? Nisoldipine   ? Penicillin G Potassium In D5w   ? Prednisone Other (See Comments)  ?  Headache ?  ? Sulfa Antibiotics Nausea Only  ? Cephalexin Swelling  ?  Lip swelling with Keflex prescribed in January 2021. Family administered benadryl.  ? Ciprofloxacin Rash  ? ? ?Family History: ?Family History  ?Problem Relation Age of Onset  ? Breast cancer Sister 73  ? Breast cancer Maternal Grandmother 31  ? Breast cancer Sister 19  ? Bladder Cancer Brother   ?  Hypertension Neg Hx   ? Kidney cancer Neg Hx   ? ? ?Social History:  reports that she has never smoked. She has never been exposed to tobacco smoke. She has never used smokeless tobacco. She reports that she does not drink alcohol and does not use drugs. ? ? ?Physical Exam: ?There were no vitals taken for this visit.  ?Constitutional:  Alert and oriented, No acute distress. ?HEENT: [place] AT, moist mucus membranes.  Trachea midline, no masses. ?Cardiovascular: No clubbing, cyanosis, or edema. ?Respiratory: Normal respiratory effort, no increased work of breathing. ?GI: Abdomen is soft, nontender, nondistended, no abdominal masses ?GU: No CVA tenderness ?Lymph: No cervical or inguinal lymphadenopathy. ?Skin: No rashes, bruises or suspicious lesions. ?Neurologic: Grossly intact, no focal deficits, moving all 4 extremities. ?Psychiatric: Normal mood and affect. ? ?Laboratory Data: ? ?Lab Results  ?Component Value Date  ? CREATININE 0.65 10/02/2021  ? ? ? ?No results found for: HGBA1C ? ?Urinalysis ? ? ?Pertinent Imaging: ? ? ?Assessment & Plan:   ? ? ?No follow-ups on file. ? ?[facility] ?[address], [address] ?[place], [place] [address] ?[phone] ? ?I,Kailey Littlejohn,acting as a Education administrator for [employer], PA-C.,have documented all relevant documentation on the behalf of [place], PA-C,as directed by  [facility], PA-C while in the presence of [place], PA-C. ? ?
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
## 2022-01-26 ENCOUNTER — Telehealth: Payer: Self-pay
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
## 2022-01-26 NOTE — Telephone Encounter (Signed)
This is a concerning situation. I believe she would need to address this with the caregiver or the agency/company. ? ?I am not the provider who is prescribing her hydrocodone pain medication. I believe it is managed by a Palliative or a Pain Management provider. ? ?They would need to contact the prescribing provider to further help with pill counts and refills further to help her. ? ?If she developed an acute withdrawal that is not life threatening, it is uncomfortable with digestive symptoms and issues, she could present to hospital ED and they can treat withdrawal at that time if needed. ? ?Charlene Putnam, DO ?[facility] ?[facility] ?01/26/2022, 4:24 PM ? ?

## 2022-01-28 ENCOUNTER — Ambulatory Visit: Payer: Medicare Other | Admitting: Urology

## 2022-02-03 ENCOUNTER — Other Ambulatory Visit: Payer: Self-pay | Admitting: Family Medicine

## 2022-02-03 DIAGNOSIS — R601 Generalized edema: Secondary | ICD-10-CM

## 2022-02-03 DIAGNOSIS — Z515 Encounter for palliative care: Secondary | ICD-10-CM

## 2022-02-03 DIAGNOSIS — F411 Generalized anxiety disorder: Secondary | ICD-10-CM

## 2022-02-03 DIAGNOSIS — I10 Essential (primary) hypertension: Secondary | ICD-10-CM

## 2022-02-04 NOTE — Telephone Encounter (Signed)
Requested Prescriptions  ?Pending Prescriptions Disp Refills  ?? furosemide (LASIX) 20 MG tablet [Pharmacy Med Name: FUROSEMIDE 20 MG TABLET] 90 tablet 0  ?  Sig: Take 1 tablet (20 mg total) by mouth daily.  ?  ? Cardiovascular:  Diuretics - Loop Failed - 02/03/2022  9:49 AM  ?  ?  Failed - K in normal range and within 180 days  ?  Potassium  ?Date Value Ref Range Status  ?10/02/2021 2.9 (L) 3.5 - 5.1 mmol/L Final  ?08/04/2014 3.6 3.5 - 5.1 mmol/L Final  ?   ?  ?  Failed - Ca in normal range and within 180 days  ?  Calcium  ?Date Value Ref Range Status  ?10/02/2021 8.5 (L) 8.9 - 10.3 mg/dL Final  ? ?Calcium, Total  ?Date Value Ref Range Status  ?08/04/2014 9.2 8.5 - 10.1 mg/dL Final  ?   ?  ?  Failed - Mg Level in normal range and within 180 days  ?  Magnesium  ?Date Value Ref Range Status  ?12/19/2015 2.2 1.7 - 2.4 mg/dL Final  ?   ?  ?  Failed - Last BP in normal range  ?  BP Readings from Last 1 Encounters:  ?12/14/21 (!) 83/62  ?   ?  ?  Passed - Na in normal range and within 180 days  ?  Sodium  ?Date Value Ref Range Status  ?10/02/2021 138 135 - 145 mmol/L Final  ?08/04/2014 140 136 - 145 mmol/L Final  ?   ?  ?  Passed - Cr in normal range and within 180 days  ?  Creatinine  ?Date Value Ref Range Status  ?08/04/2014 0.98 0.60 - 1.30 mg/dL Final  ? ?Creatinine, Ser  ?Date Value Ref Range Status  ?10/02/2021 0.65 0.44 - 1.00 mg/dL Final  ?   ?  ?  Passed - Cl in normal range and within 180 days  ?  Chloride  ?Date Value Ref Range Status  ?10/02/2021 101 98 - 111 mmol/L Final  ?08/04/2014 104 98 - 107 mmol/L Final  ?   ?  ?  Passed - Valid encounter within last 6 months  ?  Recent Outpatient Visits   ?      ? 1 month ago Left ear impacted cerumen  ? [place], DO  ? 1 year ago COVID-19 virus infection  ? [place], DO  ? 1 year ago Generalized anxiety disorder  ? [place], DO  ? 2 years  ago Acute left otitis media  ? [place], DO  ? 2 years ago Acute cystitis without hematuria  ? [facility] [place], [place], [place]  ?  ?  ?Future Appointments   ?        ? In 5 days Luana Shu [place]  ? In 5 months  [place]  ?  ? ?  ?  ?  ?? amLODipine (NORVASC) 5 MG tablet [Pharmacy Med Name: AMLODIPINE BESYLATE 5 MG TAB] 90 tablet 0  ?  Sig: Take 1 tablet (5 mg total) by mouth daily. NEED APPT FOR FURTHER REFILL PER MD  ?  ? Cardiovascular: Calcium Channel Blockers 2 Failed - 02/03/2022  9:49 AM  ?  ?  Failed - Last BP in normal range  ?  BP Readings from Last 1 Encounters:  ?  12/14/21 (!) 83/62  ?   ?  ?  Passed - Last Heart Rate in normal range  ?  Pulse Readings from Last 1 Encounters:  ?12/14/21 77  ?   ?  ?  Passed - Valid encounter within last 6 months  ?  Recent Outpatient Visits   ?      ? 1 month ago Left ear impacted cerumen  ? [place], DO  ? 1 year ago COVID-19 virus infection  ? [place], DO  ? 1 year ago Generalized anxiety disorder  ? [place], DO  ? 2 years ago Acute left otitis media  ? [place], DO  ? 2 years ago Acute cystitis without hematuria  ? [facility] [place], [place], [place]  ?  ?  ?Future Appointments   ?        ? In 5 days Luana Shu [place]  ? In 5 months  [facility], [place]  ?  ? ?  ?  ?  ? ?

## 2022-02-04 NOTE — Telephone Encounter (Signed)
Requested medication (s) are due for refill today: Yes ? ?Requested medication (s) are on the active medication list: Yes ? ?Last refill:  01/06/22 ? ?Future visit scheduled: No ? ?Notes to clinic:  Not delegated. ? ? ? ?Requested Prescriptions  ?Pending Prescriptions Disp Refills  ? LORazepam (ATIVAN) 0.5 MG tablet [Pharmacy Med Name: LORAZEPAM 0.5 MG TABLET] 120 tablet 0  ?  Sig: Take 1 tablet (0.5 mg total) by mouth 4 (four) times daily as needed for anxiety.  ?  ? Not Delegated - Psychiatry: Anxiolytics/Hypnotics 2 Failed - 02/03/2022  9:48 AM  ?  ?  Failed - This refill cannot be delegated  ?  ?  Failed - Urine Drug Screen completed in last 360 days  ?  ?  Passed - Patient is not pregnant  ?  ?  Passed - Valid encounter within last 6 months  ?  Recent Outpatient Visits   ? ?      ? 1 month ago Left ear impacted cerumen  ? [place], DO  ? 1 year ago COVID-19 virus infection  ? [place], DO  ? 1 year ago Generalized anxiety disorder  ? [place], DO  ? 2 years ago Acute left otitis media  ? [place], DO  ? 2 years ago Acute cystitis without hematuria  ? [facility] [place], [place], [place]  ? ?  ?  ?Future Appointments   ? ?        ? In 5 days Luana Shu [place]  ? In 5 months  [facility], [place]  ? ?  ? ? ?  ?  ?  ? ?

## 2022-02-09 ENCOUNTER — Ambulatory Visit: Payer: Medicare Other | Admitting: Physician Assistant

## 2022-02-09 ENCOUNTER — Telehealth: Payer: Self-pay

## 2022-02-09 NOTE — Telephone Encounter (Signed)
939 am.  Return call made to Susan-daughter at the request of Claudia-daughter regarding resources.  No answer.  Message left requesting a call back.  ?

## 2022-02-09 NOTE — Telephone Encounter (Signed)
312 pm Spoke with daughter [name].  Virtual visit scheduled for Friday with Ralene Bathe, NP. ?

## 2022-02-12 ENCOUNTER — Other Ambulatory Visit: Payer: Medicare Other | Admitting: Primary Care

## 2022-02-17 ENCOUNTER — Other Ambulatory Visit: Payer: Self-pay | Admitting: Family Medicine

## 2022-02-17 DIAGNOSIS — F411 Generalized anxiety disorder: Secondary | ICD-10-CM

## 2022-02-18 DIAGNOSIS — E890 Postprocedural hypothyroidism: Secondary | ICD-10-CM | POA: Diagnosis not present

## 2022-02-18 DIAGNOSIS — C73 Malignant neoplasm of thyroid gland: Secondary | ICD-10-CM | POA: Diagnosis not present

## 2022-02-18 NOTE — Telephone Encounter (Signed)
Requested Prescriptions  ?Pending Prescriptions Disp Refills  ?? mirtazapine (REMERON) 30 MG tablet [Pharmacy Med Name: MIRTAZAPINE 30 MG TABLET] 30 tablet 0  ?  Sig: Take 1 tablet (30 mg total) by mouth at bedtime.  ?  ? Psychiatry: Antidepressants - mirtazapine Passed - 02/17/2022  5:32 PM  ?  ?  Passed - Valid encounter within last 6 months  ?  Recent Outpatient Visits   ?      ? 2 months ago Left ear impacted cerumen  ? [place], DO  ? 1 year ago COVID-19 virus infection  ? [place], DO  ? 1 year ago Generalized anxiety disorder  ? [place], DO  ? 2 years ago Acute left otitis media  ? [place], DO  ? 2 years ago Acute cystitis without hematuria  ? [facility] [place], [place], [place]  ?  ?  ?Future Appointments   ?        ? In 1 week Luana Shu [place]  ? In 4 months  [facility], [place]  ?  ? ?  ?  ?  ? ? ?

## 2022-02-25 ENCOUNTER — Ambulatory Visit: Payer: Medicare Other | Admitting: Physician Assistant

## 2022-03-03 ENCOUNTER — Telehealth: Payer: Self-pay

## 2022-03-03 DIAGNOSIS — H6122 Impacted cerumen, left ear: Secondary | ICD-10-CM | POA: Diagnosis not present

## 2022-03-03 DIAGNOSIS — C73 Malignant neoplasm of thyroid gland: Secondary | ICD-10-CM | POA: Diagnosis not present

## 2022-03-03 DIAGNOSIS — H9202 Otalgia, left ear: Secondary | ICD-10-CM | POA: Diagnosis not present

## 2022-03-03 NOTE — Telephone Encounter (Signed)
1258 pm.  Follow up call made to daughter [name] to check on patient status and need for home visit.  No answer and unable to leave a VM.

## 2022-03-09 ENCOUNTER — Ambulatory Visit (INDEPENDENT_AMBULATORY_CARE_PROVIDER_SITE_OTHER): Payer: Medicare Other | Admitting: Physician Assistant

## 2022-03-09 ENCOUNTER — Encounter: Payer: Self-pay | Admitting: Physician Assistant

## 2022-03-09 DIAGNOSIS — R339 Retention of urine, unspecified: Secondary | ICD-10-CM | POA: Diagnosis not present

## 2022-03-09 DIAGNOSIS — N3289 Other specified disorders of bladder: Secondary | ICD-10-CM

## 2022-03-09 DIAGNOSIS — Z466 Encounter for fitting and adjustment of urinary device: Secondary | ICD-10-CM | POA: Diagnosis not present

## 2022-03-09 MED ORDER — GEMTESA 75 MG PO TABS
75.0000 mg | ORAL_TABLET | Freq: Every day | ORAL | 0 refills | Status: DC
Start: 2022-03-09 — End: 2022-05-06

## 2022-03-09 NOTE — Progress Notes (Signed)
Cath Change/ Replacement  Patient is present today for a catheter change due to urinary retention.  58m of water was removed from the balloon, a 16FR foley cath was removed without difficulty.  Patient was cleaned and prepped in a sterile fashion with betadine. A 16 FR foley cath was replaced into the bladder no complications were noted Urine return was noted 290mand urine was yellow in color. The balloon was filled with 106mf sterile water. A night bag was attached for drainage.  A night bag was also given to the patient and patient was given instruction on how to change from one bag to another. Patient was given proper instruction on catheter care.    Performed by: SamDebroah LoopA-C and JesGaspar ColaMA  Additional notes: Patient reports ongoing bladder spasms, is taking Azo. Gemtesa samples x6 weeks provided today; she previously failed Myrbetriq.  Follow up: Return in about 4 weeks (around 04/06/2022) for Catheter exchange.

## 2022-03-16 ENCOUNTER — Other Ambulatory Visit: Payer: Self-pay | Admitting: Family Medicine

## 2022-03-16 DIAGNOSIS — F411 Generalized anxiety disorder: Secondary | ICD-10-CM

## 2022-03-17 NOTE — Telephone Encounter (Signed)
Requested Prescriptions  Pending Prescriptions Disp Refills  . mirtazapine (REMERON) 30 MG tablet [Pharmacy Med Name: MIRTAZAPINE 30 MG TABLET] 30 tablet 2    Sig: Take 1 tablet (30 mg total) by mouth at bedtime.     Psychiatry: Antidepressants - mirtazapine Passed - 03/16/2022  4:58 PM      Passed - Valid encounter within last 6 months    Recent Outpatient Visits          3 months ago Left ear impacted cerumen   [facility] Olin Hauser, DO   1 year ago COVID-19 virus infection   [place], DO   1 year ago Generalized anxiety disorder   [place], DO   2 years ago Acute left otitis media   [place], DO   2 years ago Acute cystitis without hematuria   [facility] [name], [place]      Future Appointments            In 2 weeks Debroah Loop, [place]   In 3 months  [facility], [facility] [place]

## 2022-03-23 DIAGNOSIS — H6123 Impacted cerumen, bilateral: Secondary | ICD-10-CM | POA: Diagnosis not present

## 2022-03-23 DIAGNOSIS — H9202 Otalgia, left ear: Secondary | ICD-10-CM | POA: Diagnosis not present

## 2022-03-23 DIAGNOSIS — G501 Atypical facial pain: Secondary | ICD-10-CM | POA: Diagnosis not present

## 2022-03-23 DIAGNOSIS — Z85850 Personal history of malignant neoplasm of thyroid: Secondary | ICD-10-CM | POA: Diagnosis not present

## 2022-03-25 ENCOUNTER — Other Ambulatory Visit: Payer: Self-pay | Admitting: Otolaryngology

## 2022-03-25 DIAGNOSIS — G501 Atypical facial pain: Secondary | ICD-10-CM

## 2022-03-25 DIAGNOSIS — Z85850 Personal history of malignant neoplasm of thyroid: Secondary | ICD-10-CM

## 2022-04-06 ENCOUNTER — Other Ambulatory Visit: Payer: Self-pay | Admitting: Family Medicine

## 2022-04-06 ENCOUNTER — Ambulatory Visit: Payer: Medicare Other | Admitting: Physician Assistant

## 2022-04-06 DIAGNOSIS — I10 Essential (primary) hypertension: Secondary | ICD-10-CM

## 2022-04-06 DIAGNOSIS — F411 Generalized anxiety disorder: Secondary | ICD-10-CM

## 2022-04-06 NOTE — Telephone Encounter (Signed)
Requested medications are due for refill today.  yes  Requested medications are on the active medications list.  yes  Last refill. 06/22/2021 #180 1 refill  Future visit scheduled.   yes  Notes to clinic.  Labs are expired.    Requested Prescriptions  Pending Prescriptions Disp Refills   carvedilol (COREG) 6.25 MG tablet [Pharmacy Med Name: CARVEDILOL 6.25 MG TABLET] 180 tablet 0    Sig: Take 1 tablet (6.25 mg total) by mouth 2 (two) times daily with a meal.     Cardiovascular: Beta Blockers 3 Failed - 04/06/2022  5:32 PM      Failed - AST in normal range and within 360 days    AST  Date Value Ref Range Status  04/16/2020 22 15 - 41 U/L Final   SGOT(AST)  Date Value Ref Range Status  05/03/2014 17 15 - 37 Unit/L Final         Failed - ALT in normal range and within 360 days    ALT  Date Value Ref Range Status  04/16/2020 12 0 - 44 U/L Final   SGPT (ALT)  Date Value Ref Range Status  05/03/2014 21 U/L Final    Comment:    14-63 NOTE: New Reference Range 04/30/14          Failed - Last BP in normal range    BP Readings from Last 1 Encounters:  12/14/21 (!) 83/62         Passed - Cr in normal range and within 360 days    Creatinine  Date Value Ref Range Status  08/04/2014 0.98 0.60 - 1.30 mg/dL Final   Creatinine, Ser  Date Value Ref Range Status  10/02/2021 0.65 0.44 - 1.00 mg/dL Final         Passed - Last Heart Rate in normal range    Pulse Readings from Last 1 Encounters:  12/14/21 77         Passed - Valid encounter within last 6 months    Recent Outpatient Visits           3 months ago Left ear impacted cerumen   [facility] Smitty Cords, DO   1 year ago COVID-19 virus infection   [facility] Smitty Cords, DO   1 year ago Generalized anxiety disorder   [facility], Netta Neat, DO   2 years ago Acute left otitis media   [facility]  [place], Netta Neat, DO   2 years ago Acute cystitis without hematuria   [facility] Trey Sailors, [place]       Future Appointments             In 2 weeks Carman Ching, PA-C [facility]   In 3 months  [facility], [facility]

## 2022-04-15 ENCOUNTER — Ambulatory Visit
Admission: RE | Admit: 2022-04-15 | Discharge: 2022-04-15 | Disposition: A | Discharge status: Home / Self Care | Payer: Medicare Other | Source: Ambulatory Visit | Attending: Otolaryngology | Admitting: Otolaryngology

## 2022-04-15 DIAGNOSIS — J387 Other diseases of larynx: Secondary | ICD-10-CM | POA: Diagnosis not present

## 2022-04-15 DIAGNOSIS — E041 Nontoxic single thyroid nodule: Secondary | ICD-10-CM | POA: Diagnosis not present

## 2022-04-15 DIAGNOSIS — G501 Atypical facial pain: Secondary | ICD-10-CM

## 2022-04-15 DIAGNOSIS — J392 Other diseases of pharynx: Secondary | ICD-10-CM | POA: Diagnosis not present

## 2022-04-15 DIAGNOSIS — Z85850 Personal history of malignant neoplasm of thyroid: Secondary | ICD-10-CM | POA: Diagnosis not present

## 2022-04-15 MED ORDER — IOPAMIDOL (ISOVUE-300) INJECTION 61%
75.0000 mL | Freq: Once | INTRAVENOUS | Status: AC | PRN
  Administered 2022-04-15: 75 mL via INTRAVENOUS

## 2022-04-20 ENCOUNTER — Ambulatory Visit: Payer: Medicare Other | Admitting: Physician Assistant

## 2022-04-26 ENCOUNTER — Ambulatory Visit: Payer: Medicare Other | Admitting: Physician Assistant

## 2022-05-03 ENCOUNTER — Ambulatory Visit: Payer: Medicare Other | Admitting: Physician Assistant

## 2022-05-04 ENCOUNTER — Inpatient Hospital Stay
Admission: EM | Admit: 2022-05-04 | Discharge: 2022-05-06 | DRG: 378 | Disposition: A | Discharge status: Home / Self Care | Payer: Medicare Other | Attending: Internal Medicine | Admitting: Internal Medicine

## 2022-05-04 ENCOUNTER — Emergency Department: Payer: Medicare Other

## 2022-05-04 DIAGNOSIS — Z90710 Acquired absence of both cervix and uterus: Secondary | ICD-10-CM

## 2022-05-04 DIAGNOSIS — R0902 Hypoxemia: Secondary | ICD-10-CM | POA: Diagnosis not present

## 2022-05-04 DIAGNOSIS — K649 Unspecified hemorrhoids: Secondary | ICD-10-CM | POA: Diagnosis present

## 2022-05-04 DIAGNOSIS — E7800 Pure hypercholesterolemia, unspecified: Secondary | ICD-10-CM | POA: Diagnosis present

## 2022-05-04 DIAGNOSIS — D62 Acute posthemorrhagic anemia: Secondary | ICD-10-CM | POA: Diagnosis present

## 2022-05-04 DIAGNOSIS — Z981 Arthrodesis status: Secondary | ICD-10-CM

## 2022-05-04 DIAGNOSIS — R338 Other retention of urine: Secondary | ICD-10-CM | POA: Diagnosis present

## 2022-05-04 DIAGNOSIS — F419 Anxiety disorder, unspecified: Secondary | ICD-10-CM | POA: Diagnosis present

## 2022-05-04 DIAGNOSIS — N281 Cyst of kidney, acquired: Secondary | ICD-10-CM | POA: Diagnosis present

## 2022-05-04 DIAGNOSIS — Z885 Allergy status to narcotic agent status: Secondary | ICD-10-CM

## 2022-05-04 DIAGNOSIS — I10 Essential (primary) hypertension: Secondary | ICD-10-CM | POA: Diagnosis present

## 2022-05-04 DIAGNOSIS — Z881 Allergy status to other antibiotic agents status: Secondary | ICD-10-CM

## 2022-05-04 DIAGNOSIS — K5909 Other constipation: Secondary | ICD-10-CM | POA: Diagnosis present

## 2022-05-04 DIAGNOSIS — R52 Pain, unspecified: Secondary | ICD-10-CM | POA: Diagnosis not present

## 2022-05-04 DIAGNOSIS — M810 Age-related osteoporosis without current pathological fracture: Secondary | ICD-10-CM | POA: Diagnosis present

## 2022-05-04 DIAGNOSIS — Z681 Body mass index (BMI) 19 or less, adult: Secondary | ICD-10-CM

## 2022-05-04 DIAGNOSIS — Z79890 Hormone replacement therapy (postmenopausal): Secondary | ICD-10-CM

## 2022-05-04 DIAGNOSIS — Z888 Allergy status to other drugs, medicaments and biological substances status: Secondary | ICD-10-CM

## 2022-05-04 DIAGNOSIS — N3289 Other specified disorders of bladder: Secondary | ICD-10-CM | POA: Diagnosis not present

## 2022-05-04 DIAGNOSIS — Z978 Presence of other specified devices: Secondary | ICD-10-CM

## 2022-05-04 DIAGNOSIS — M4856XA Collapsed vertebra, not elsewhere classified, lumbar region, initial encounter for fracture: Secondary | ICD-10-CM | POA: Diagnosis not present

## 2022-05-04 DIAGNOSIS — G8929 Other chronic pain: Secondary | ICD-10-CM | POA: Diagnosis present

## 2022-05-04 DIAGNOSIS — K921 Melena: Principal | ICD-10-CM | POA: Diagnosis present

## 2022-05-04 DIAGNOSIS — E890 Postprocedural hypothyroidism: Secondary | ICD-10-CM | POA: Diagnosis present

## 2022-05-04 DIAGNOSIS — N2889 Other specified disorders of kidney and ureter: Secondary | ICD-10-CM

## 2022-05-04 DIAGNOSIS — D49512 Neoplasm of unspecified behavior of left kidney: Secondary | ICD-10-CM | POA: Diagnosis not present

## 2022-05-04 DIAGNOSIS — E440 Moderate protein-calorie malnutrition: Secondary | ICD-10-CM | POA: Diagnosis present

## 2022-05-04 DIAGNOSIS — I959 Hypotension, unspecified: Secondary | ICD-10-CM | POA: Diagnosis present

## 2022-05-04 DIAGNOSIS — K922 Gastrointestinal hemorrhage, unspecified: Principal | ICD-10-CM

## 2022-05-04 DIAGNOSIS — Z882 Allergy status to sulfonamides status: Secondary | ICD-10-CM

## 2022-05-04 DIAGNOSIS — R64 Cachexia: Secondary | ICD-10-CM | POA: Diagnosis present

## 2022-05-04 DIAGNOSIS — I708 Atherosclerosis of other arteries: Secondary | ICD-10-CM | POA: Diagnosis present

## 2022-05-04 DIAGNOSIS — Z85850 Personal history of malignant neoplasm of thyroid: Secondary | ICD-10-CM

## 2022-05-04 DIAGNOSIS — K529 Noninfective gastroenteritis and colitis, unspecified: Secondary | ICD-10-CM

## 2022-05-04 DIAGNOSIS — H9190 Unspecified hearing loss, unspecified ear: Secondary | ICD-10-CM | POA: Diagnosis present

## 2022-05-04 DIAGNOSIS — Z79899 Other long term (current) drug therapy: Secondary | ICD-10-CM

## 2022-05-04 DIAGNOSIS — Z8719 Personal history of other diseases of the digestive system: Secondary | ICD-10-CM

## 2022-05-04 DIAGNOSIS — Z880 Allergy status to penicillin: Secondary | ICD-10-CM

## 2022-05-04 DIAGNOSIS — R911 Solitary pulmonary nodule: Secondary | ICD-10-CM | POA: Diagnosis present

## 2022-05-04 DIAGNOSIS — K219 Gastro-esophageal reflux disease without esophagitis: Secondary | ICD-10-CM | POA: Diagnosis present

## 2022-05-04 DIAGNOSIS — I119 Hypertensive heart disease without heart failure: Secondary | ICD-10-CM | POA: Diagnosis present

## 2022-05-04 DIAGNOSIS — F552 Abuse of laxatives: Secondary | ICD-10-CM | POA: Diagnosis present

## 2022-05-04 DIAGNOSIS — J439 Emphysema, unspecified: Secondary | ICD-10-CM | POA: Diagnosis present

## 2022-05-04 DIAGNOSIS — K648 Other hemorrhoids: Secondary | ICD-10-CM | POA: Diagnosis present

## 2022-05-04 DIAGNOSIS — E876 Hypokalemia: Secondary | ICD-10-CM | POA: Diagnosis present

## 2022-05-04 LAB — COMPREHENSIVE METABOLIC PANEL
ALT: 10 U/L (ref 0–44)
AST: 23 U/L (ref 15–41)
Albumin: 3.1 g/dL — ABNORMAL LOW (ref 3.5–5.0)
Alkaline Phosphatase: 56 U/L (ref 38–126)
Anion gap: 6 (ref 5–15)
BUN: 15 mg/dL (ref 8–23)
CO2: 24 mmol/L (ref 22–32)
Calcium: 7.5 mg/dL — ABNORMAL LOW (ref 8.9–10.3)
Chloride: 105 mmol/L (ref 98–111)
Creatinine, Ser: 0.83 mg/dL (ref 0.44–1.00)
GFR, Estimated: >60 mL/min (ref >60)
Glucose, Bld: 165 mg/dL — ABNORMAL HIGH (ref 70–99)
Potassium: 3.3 mmol/L — ABNORMAL LOW (ref 3.5–5.1)
Sodium: 135 mmol/L (ref 135–145)
Total Bilirubin: 1 mg/dL (ref 0.3–1.2)
Total Protein: 5.9 g/dL — ABNORMAL LOW (ref 6.5–8.1)

## 2022-05-04 LAB — CBC WITH DIFFERENTIAL/PLATELET
Abs Immature Granulocytes: 0.03 10*3/uL (ref 0.00–0.07)
Basophils Absolute: 0 10*3/uL (ref 0.0–0.1)
Basophils Relative: 1 %
Eosinophils Absolute: 0.1 10*3/uL (ref 0.0–0.5)
Eosinophils Relative: 1 %
HCT: 26 % — ABNORMAL LOW (ref 36.0–46.0)
Hemoglobin: 7.7 g/dL — ABNORMAL LOW (ref 12.0–15.0)
Immature Granulocytes: 0 %
Lymphocytes Relative: 15 %
Lymphs Abs: 1.3 10*3/uL (ref 0.7–4.0)
MCH: 25.8 pg — ABNORMAL LOW (ref 26.0–34.0)
MCHC: 29.6 g/dL — ABNORMAL LOW (ref 30.0–36.0)
MCV: 87 fL (ref 80.0–100.0)
Monocytes Absolute: 0.7 10*3/uL (ref 0.1–1.0)
Monocytes Relative: 8 %
Neutro Abs: 6.4 10*3/uL (ref 1.7–7.7)
Neutrophils Relative %: 75 %
Platelets: 168 10*3/uL (ref 150–400)
RBC: 2.99 MIL/uL — ABNORMAL LOW (ref 3.87–5.11)
RDW: 15.8 % — ABNORMAL HIGH (ref 11.5–15.5)
WBC: 8.5 10*3/uL (ref 4.0–10.5)
nRBC: 0 % (ref 0.0–0.2)

## 2022-05-04 LAB — LIPASE, BLOOD: Lipase: 32 U/L (ref 11–51)

## 2022-05-04 LAB — TYPE AND SCREEN

## 2022-05-04 LAB — PROTIME-INR
INR: 1.2 (ref 0.8–1.2)
Prothrombin Time: 14.6 seconds (ref 11.4–15.2)

## 2022-05-04 LAB — LACTIC ACID, PLASMA: Lactic Acid, Venous: 2.6 mmol/L (ref 0.5–1.9)

## 2022-05-04 MED ORDER — SODIUM CHLORIDE 0.9 % IV BOLUS
1000.0000 mL | Freq: Once | INTRAVENOUS | Status: AC
  Administered 2022-05-04: 1000 mL via INTRAVENOUS

## 2022-05-04 MED ORDER — IOHEXOL 350 MG/ML SOLN
75.0000 mL | Freq: Once | INTRAVENOUS | Status: AC | PRN
  Administered 2022-05-04: 75 mL via INTRAVENOUS

## 2022-05-04 MED ORDER — ACETAMINOPHEN 325 MG PO TABS
650.0000 mg | ORAL_TABLET | Freq: Once | ORAL | Status: AC
  Administered 2022-05-05: 650 mg via ORAL
  Filled 2022-05-04: qty 2

## 2022-05-04 NOTE — ED Provider Notes (Signed)
[facility] Provider Note   Event Date/Time   First MD Initiated Contact with Patient 05/04/22 2159     (approximate) History  Rectal Bleeding (Per EMS pt is currently bleeding from rectal and chronic foley. A&O. N/V )  HPI [name] is a 88 y.o. female with a past medical history of hypertension, emphysema, GERD, and internal hemorrhoids who presents via EMS with large volume rectal bleeding.  Patient also has a Foley catheter in place that has been draining red urine over the past day as well however patient has been taking Azo over this time.  EMS note that when they arrived at the house patient had "half a bedside commode full of blood".  Patient's initial systolic blood pressure was in the 70s that improved to the 100s after 500 of IV fluids.  Patient does complain of abdominal pain but is a very poor historian and will not localize the pain at this time.  Patient also complaining of chills and the inability to warm up.  Patient also complaining of generalized fatigue    Physical Exam  Triage Vital Signs: ED Triage Vitals  Enc Vitals Group     BP 05/04/22 2159 106/62     Pulse Rate 05/04/22 2159 70     Resp 05/04/22 2159 14     Temp 05/04/22 2159 98.3 F (36.8 C)     Temp Source 05/04/22 2159 Oral     SpO2 05/04/22 2159 94 %     Weight 05/04/22 2201 90 lb (40.8 kg)     Height 05/04/22 2201 '5\' 1"'$  (1.549 m)     Head Circumference --      Peak Flow --      Pain Score 05/04/22 2201 0     Pain Loc --      Pain Edu? --      Excl. in [place]? --    Most recent vital signs: Vitals:   05/05/22 2003 05/06/22 0003  BP: 109/72 112/72  Pulse: 80 64  Resp: 16 17  Temp:  98 F (36.7 C)  SpO2: 94% (!) 87%   General: Awake, oriented x4. CV:  Good peripheral perfusion.  Resp:  Normal effort.  Abd:  No distention.  Other:  Elderly cachectic Caucasian female laying in bed in mild distress secondary to abdominal pain.  Gross red blood rectally ED Results /  Procedures / Treatments  Labs (all labs ordered are listed, but only abnormal results are displayed) Labs Reviewed  COMPREHENSIVE METABOLIC PANEL - Abnormal; Notable for the following components:      Result Value   Potassium 3.3 (*)    Glucose, Bld 165 (*)    Calcium 7.5 (*)    Total Protein 5.9 (*)    Albumin 3.1 (*)    All other components within normal limits  CBC WITH DIFFERENTIAL/PLATELET - Abnormal; Notable for the following components:   RBC 2.99 (*)    Hemoglobin 7.7 (*)    HCT 26.0 (*)    MCH 25.8 (*)    MCHC 29.6 (*)    RDW 15.8 (*)    All other components within normal limits  LACTIC ACID, PLASMA - Abnormal; Notable for the following components:   Lactic Acid, Venous 2.6 (*)    All other components within normal limits  HEMOGLOBIN AND HEMATOCRIT, BLOOD - Abnormal; Notable for the following components:   Hemoglobin 7.1 (*)    HCT 22.7 (*)    All other components within normal  limits  FERRITIN - Abnormal; Notable for the following components:   Ferritin 9 (*)    All other components within normal limits  IRON AND TIBC - Abnormal; Notable for the following components:   Saturation Ratios 9 (*)    All other components within normal limits  HEMOGLOBIN AND HEMATOCRIT, BLOOD - Abnormal; Notable for the following components:   Hemoglobin 9.6 (*)    HCT 29.9 (*)    All other components within normal limits  URINALYSIS, ROUTINE W REFLEX MICROSCOPIC - Abnormal; Notable for the following components:   Color, Urine STRAW (*)    APPearance CLEAR (*)    Hgb urine dipstick SMALL (*)    Leukocytes,Ua SMALL (*)    Bacteria, UA RARE (*)    All other components within normal limits  PROTIME-INR  LIPASE, BLOOD  LACTIC ACID, PLASMA  FOLATE  VITAMIN B12  TYPE AND SCREEN  PREPARE RBC (CROSSMATCH)  ABO/RH  PREPARE RBC (CROSSMATCH)   RADIOLOGY ED MD interpretation: CT angiography of the abdomen and pelvis interpreted by me and shows whole bowel colitis without any evidence of  active bleeding at this time. -Agree with radiology assessment Official radiology report(s): No results found. PROCEDURES: Critical Care performed: Yes, see critical care procedure note(s) .1-3 Lead EKG Interpretation  Performed by: Naaman Plummer, MD Authorized by: Naaman Plummer, MD     Interpretation: normal     ECG rate:  87   ECG rate assessment: normal     Rhythm: sinus rhythm     Ectopy: none     Conduction: normal   CRITICAL CARE Performed by: Naaman Plummer  Total critical care time: 37 minutes  Critical care time was exclusive of separately billable procedures and treating other patients.  Critical care was necessary to treat or prevent imminent or life-threatening deterioration.  Critical care was time spent personally by me on the following activities: development of treatment plan with patient and/or surrogate as well as nursing, discussions with consultants, evaluation of patient's response to treatment, examination of patient, obtaining history from patient or surrogate, ordering and performing treatments and interventions, ordering and review of laboratory studies, ordering and review of radiographic studies, pulse oximetry and re-evaluation of patient's condition.  MEDICATIONS ORDERED IN ED: Medications  0.9 %  sodium chloride infusion (Manually program via Guardrails IV Fluids) (0 mLs Intravenous Hold 05/05/22 0244)  levothyroxine (SYNTHROID) tablet 88 mcg (88 mcg Oral Given 05/06/22 0634)  FLUoxetine (PROZAC) capsule 20 mg (20 mg Oral Given 05/05/22 0928)  LORazepam (ATIVAN) tablet 0.5 mg (0.5 mg Oral Given 05/06/22 0634)  mirtazapine (REMERON) tablet 30 mg (30 mg Oral Given 05/05/22 2112)  mirabegron ER (MYRBETRIQ) tablet 25 mg (25 mg Oral Given 05/05/22 0928)  acetaminophen (TYLENOL) tablet 650 mg (has no administration in time range)    Or  acetaminophen (TYLENOL) suppository 650 mg (has no administration in time range)  ondansetron (ZOFRAN) tablet 4 mg (has no  administration in time range)    Or  ondansetron (ZOFRAN) injection 4 mg (has no administration in time range)  0.9 %  sodium chloride infusion ( Intravenous New Bag/Given 05/05/22 1602)  HYDROcodone-acetaminophen (NORCO) 10-325 MG per tablet 1 tablet (1 tablet Oral Given 05/06/22 0344)  0.9 %  sodium chloride infusion (Manually program via Guardrails IV Fluids) (0 mLs Intravenous Stopped 05/05/22 0930)  sodium chloride (OCEAN) 0.65 % nasal spray 1 spray (has no administration in time range)  Chlorhexidine Gluconate Cloth 2 % PADS 6 each (has no  administration in time range)  sodium chloride 0.9 % bolus 1,000 mL (0 mLs Intravenous Stopped 05/05/22 0237)  iohexol (OMNIPAQUE) 350 MG/ML injection 75 mL (75 mLs Intravenous Contrast Given 05/04/22 2312)  acetaminophen (TYLENOL) tablet 650 mg (650 mg Oral Given 05/05/22 0049)  gabapentin (NEURONTIN) capsule 100 mg (100 mg Oral Given 05/05/22 0636)  HYDROmorphone (DILAUDID) injection 0.5 mg (0.5 mg Intravenous Given 05/05/22 0827)  phenazopyridine (PYRIDIUM) tablet 200 mg (200 mg Oral Given 05/05/22 2356)   IMPRESSION / MDM / ASSESSMENT AND PLAN / ED COURSE  I reviewed the triage vital signs and the nursing notes.                             The patient is on the cardiac monitor to evaluate for evidence of arrhythmia and/or significant heart rate changes. Patient's presentation is most consistent with acute presentation with potential threat to life or bodily function. Given history and exam patients presentation most consistent with massive lower GI bleed possibly of unknown origin I have low suspicion for Aortoenteric fistula, Upper GI Bleed, IBD, Mesenteric Ischemia, Rectal foreign body or ulcer.  Workup: CBC, BMP, PT/INR, Type and Screen As patient's hemoglobin has sharply declined from 9-7 with severe symptoms, patient will require admission to the internal medicine service for further evaluation and management Disposition: Admit to medicine    FINAL CLINICAL IMPRESSION(S) / ED DIAGNOSES   Final diagnoses:  Gastrointestinal hemorrhage, unspecified gastrointestinal hemorrhage type  Colitis   Rx / DC Orders   ED Discharge Orders     None      Note:  This document was prepared using Dragon voice recognition software and may include unintentional dictation errors.   Naaman Plummer, MD 05/06/22 [id number]

## 2022-05-05 ENCOUNTER — Other Ambulatory Visit: Payer: Self-pay | Admitting: Family Medicine

## 2022-05-05 ENCOUNTER — Other Ambulatory Visit: Payer: Self-pay

## 2022-05-05 DIAGNOSIS — F419 Anxiety disorder, unspecified: Secondary | ICD-10-CM | POA: Diagnosis present

## 2022-05-05 DIAGNOSIS — R911 Solitary pulmonary nodule: Secondary | ICD-10-CM | POA: Diagnosis present

## 2022-05-05 DIAGNOSIS — D49512 Neoplasm of unspecified behavior of left kidney: Secondary | ICD-10-CM | POA: Diagnosis present

## 2022-05-05 DIAGNOSIS — N281 Cyst of kidney, acquired: Secondary | ICD-10-CM | POA: Diagnosis present

## 2022-05-05 DIAGNOSIS — H9190 Unspecified hearing loss, unspecified ear: Secondary | ICD-10-CM | POA: Diagnosis present

## 2022-05-05 DIAGNOSIS — K625 Hemorrhage of anus and rectum: Secondary | ICD-10-CM | POA: Diagnosis not present

## 2022-05-05 DIAGNOSIS — N2889 Other specified disorders of kidney and ureter: Secondary | ICD-10-CM

## 2022-05-05 DIAGNOSIS — E890 Postprocedural hypothyroidism: Secondary | ICD-10-CM | POA: Diagnosis present

## 2022-05-05 DIAGNOSIS — D62 Acute posthemorrhagic anemia: Secondary | ICD-10-CM | POA: Diagnosis present

## 2022-05-05 DIAGNOSIS — Z978 Presence of other specified devices: Secondary | ICD-10-CM

## 2022-05-05 DIAGNOSIS — G8929 Other chronic pain: Secondary | ICD-10-CM | POA: Diagnosis present

## 2022-05-05 DIAGNOSIS — I10 Essential (primary) hypertension: Secondary | ICD-10-CM

## 2022-05-05 DIAGNOSIS — K921 Melena: Secondary | ICD-10-CM | POA: Diagnosis not present

## 2022-05-05 DIAGNOSIS — R64 Cachexia: Secondary | ICD-10-CM | POA: Diagnosis present

## 2022-05-05 DIAGNOSIS — I119 Hypertensive heart disease without heart failure: Secondary | ICD-10-CM | POA: Diagnosis present

## 2022-05-05 DIAGNOSIS — R338 Other retention of urine: Secondary | ICD-10-CM | POA: Diagnosis present

## 2022-05-05 DIAGNOSIS — K648 Other hemorrhoids: Secondary | ICD-10-CM | POA: Diagnosis present

## 2022-05-05 DIAGNOSIS — K5909 Other constipation: Secondary | ICD-10-CM | POA: Diagnosis present

## 2022-05-05 DIAGNOSIS — R601 Generalized edema: Secondary | ICD-10-CM

## 2022-05-05 DIAGNOSIS — F552 Abuse of laxatives: Secondary | ICD-10-CM

## 2022-05-05 DIAGNOSIS — E7800 Pure hypercholesterolemia, unspecified: Secondary | ICD-10-CM | POA: Diagnosis present

## 2022-05-05 DIAGNOSIS — I959 Hypotension, unspecified: Secondary | ICD-10-CM | POA: Diagnosis present

## 2022-05-05 DIAGNOSIS — E876 Hypokalemia: Secondary | ICD-10-CM

## 2022-05-05 DIAGNOSIS — J439 Emphysema, unspecified: Secondary | ICD-10-CM | POA: Diagnosis present

## 2022-05-05 DIAGNOSIS — I708 Atherosclerosis of other arteries: Secondary | ICD-10-CM | POA: Diagnosis present

## 2022-05-05 DIAGNOSIS — M810 Age-related osteoporosis without current pathological fracture: Secondary | ICD-10-CM | POA: Diagnosis present

## 2022-05-05 DIAGNOSIS — E440 Moderate protein-calorie malnutrition: Secondary | ICD-10-CM | POA: Diagnosis present

## 2022-05-05 DIAGNOSIS — Z681 Body mass index (BMI) 19 or less, adult: Secondary | ICD-10-CM | POA: Diagnosis not present

## 2022-05-05 DIAGNOSIS — K649 Unspecified hemorrhoids: Secondary | ICD-10-CM | POA: Diagnosis present

## 2022-05-05 LAB — URINALYSIS, ROUTINE W REFLEX MICROSCOPIC
Bilirubin Urine: NEGATIVE
Glucose, UA: NEGATIVE mg/dL
Ketones, ur: NEGATIVE mg/dL
Nitrite: NEGATIVE
Protein, ur: NEGATIVE mg/dL
Specific Gravity, Urine: 1.005 (ref 1.005–1.030)
pH: 8 (ref 5.0–8.0)

## 2022-05-05 LAB — FOLATE: Folate: 15.2 ng/mL (ref >5.9)

## 2022-05-05 LAB — IRON AND TIBC
Iron: 35 ug/dL (ref 28–170)
Saturation Ratios: 9 % — ABNORMAL LOW (ref 10.4–31.8)
TIBC: 382 ug/dL (ref 250–450)
UIBC: 347 ug/dL

## 2022-05-05 LAB — FERRITIN: Ferritin: 9 ng/mL — ABNORMAL LOW (ref 11–307)

## 2022-05-05 LAB — ABO/RH: ABO/RH(D): A POS

## 2022-05-05 LAB — VITAMIN B12: Vitamin B-12: 363 pg/mL (ref 180–914)

## 2022-05-05 LAB — LACTIC ACID, PLASMA: Lactic Acid, Venous: 1.1 mmol/L (ref 0.5–1.9)

## 2022-05-05 LAB — PREPARE RBC (CROSSMATCH)

## 2022-05-05 LAB — HEMOGLOBIN AND HEMATOCRIT, BLOOD
HCT: 22.7 % — ABNORMAL LOW (ref 36.0–46.0)
HCT: 29.9 % — ABNORMAL LOW (ref 36.0–46.0)
Hemoglobin: 7.1 g/dL — ABNORMAL LOW (ref 12.0–15.0)
Hemoglobin: 9.6 g/dL — ABNORMAL LOW (ref 12.0–15.0)

## 2022-05-05 MED ORDER — ONDANSETRON HCL 4 MG/2ML IJ SOLN: 4.0000 mg | Freq: Four times a day (QID) | INTRAMUSCULAR | Status: DC | PRN

## 2022-05-05 MED ORDER — GABAPENTIN 100 MG PO CAPS
100.0000 mg | ORAL_CAPSULE | Freq: Once | ORAL | Status: AC
Start: 2022-05-05 — End: 2022-05-05
  Administered 2022-05-05: 100 mg via ORAL
  Filled 2022-05-05: qty 1

## 2022-05-05 MED ORDER — PHENAZOPYRIDINE HCL 200 MG PO TABS
200.0000 mg | ORAL_TABLET | Freq: Once | ORAL | Status: AC
  Administered 2022-05-05: 200 mg via ORAL
  Filled 2022-05-05: qty 1

## 2022-05-05 MED ORDER — SODIUM CHLORIDE 0.9% IV SOLUTION
Freq: Once | INTRAVENOUS | Status: DC
  Filled 2022-05-05: qty 250

## 2022-05-05 MED ORDER — LORAZEPAM 0.5 MG PO TABS
0.5000 mg | ORAL_TABLET | Freq: Four times a day (QID) | ORAL | Status: DC | PRN
  Administered 2022-05-05 – 2022-05-06 (×6): 0.5 mg via ORAL
  Filled 2022-05-05 (×6): qty 1

## 2022-05-05 MED ORDER — HYDROMORPHONE HCL 1 MG/ML IJ SOLN
0.5000 mg | Freq: Once | INTRAMUSCULAR | Status: AC
  Administered 2022-05-05: 0.5 mg via INTRAVENOUS
  Filled 2022-05-05: qty 0.5

## 2022-05-05 MED ORDER — ACETAMINOPHEN 325 MG PO TABS: 650.0000 mg | ORAL_TABLET | Freq: Four times a day (QID) | ORAL | Status: DC | PRN

## 2022-05-05 MED ORDER — MIRTAZAPINE 15 MG PO TABS
30.0000 mg | ORAL_TABLET | Freq: Every day | ORAL | Status: DC
  Administered 2022-05-05: 30 mg via ORAL
  Filled 2022-05-05: qty 2

## 2022-05-05 MED ORDER — ACETAMINOPHEN 650 MG RE SUPP: 650.0000 mg | Freq: Four times a day (QID) | RECTAL | Status: DC | PRN

## 2022-05-05 MED ORDER — MIRABEGRON ER 25 MG PO TB24
25.0000 mg | ORAL_TABLET | Freq: Every day | ORAL | Status: DC
  Administered 2022-05-05 – 2022-05-06 (×2): 25 mg via ORAL
  Filled 2022-05-05 (×2): qty 1

## 2022-05-05 MED ORDER — ONDANSETRON HCL 4 MG PO TABS: 4.0000 mg | ORAL_TABLET | Freq: Four times a day (QID) | ORAL | Status: DC | PRN

## 2022-05-05 MED ORDER — SALINE SPRAY 0.65 % NA SOLN
1.0000 | NASAL | Status: DC | PRN
  Filled 2022-05-05: qty 44

## 2022-05-05 MED ORDER — FLUOXETINE HCL 20 MG PO CAPS
20.0000 mg | ORAL_CAPSULE | Freq: Every day | ORAL | Status: DC
  Administered 2022-05-05 – 2022-05-06 (×2): 20 mg via ORAL
  Filled 2022-05-05 (×2): qty 1

## 2022-05-05 MED ORDER — SODIUM CHLORIDE 0.9 % IV SOLN
INTRAVENOUS | Status: DC
Start: 2022-05-05 — End: 2022-05-06

## 2022-05-05 MED ORDER — HYDROCODONE-ACETAMINOPHEN 10-325 MG PO TABS
1.0000 | ORAL_TABLET | ORAL | Status: DC | PRN
  Administered 2022-05-05 – 2022-05-06 (×9): 1 via ORAL
  Filled 2022-05-05 (×9): qty 1

## 2022-05-05 MED ORDER — LEVOTHYROXINE SODIUM 88 MCG PO TABS
88.0000 ug | ORAL_TABLET | Freq: Every day | ORAL | Status: DC
Start: 2022-05-05 — End: 2022-05-06
  Administered 2022-05-05 – 2022-05-06 (×2): 88 ug via ORAL
  Filled 2022-05-05 (×2): qty 1

## 2022-05-05 NOTE — Assessment & Plan Note (Addendum)
Acute blood loss anemia Acute colitis on CT History of ischemic colitis 2005 History of hemorrhoids CTA: No arterial or venous contrast extravasation is visible in the Bowel.Marland KitchenMarland KitchenAscending, transverse and descending colitis versus nondistention. Hb 9.3-7.7-7.1 Daughters agreed to only 1 unit PRBC . GI consult Close hemodynamic monitoring

## 2022-05-05 NOTE — Progress Notes (Signed)
This is a no charge noticed patient was admitted this AM.  Patient seen and examined H&P reviewed. [name] is a 88 y.o. female with medical history significant for HTN, anxiety, postsurgical hypothyroidism, chronic urinary retention with chronic indwelling Foley catheter and with remote history of ischemic colitis in 2005 who presents to the ED with rectal bleeding associated with abdominal cramping and fatigue.  EMS reports seeing a commode half filled with blood per ED providers note.  SBP with EMS was in the 70s. ED course and data review: BP 84/55 on arrival with pulse of 70, improving to 128/69 with IV fluid bolus Labs: Hemoglobin 7.7, down from 9.3 about 9 months prior, potassium 3.3, WBC 8.5 and lactic acid . S/p 1 unit prbc. GI consulted. Will consult palliative care for goals of care. Spoke to daughter about code status, ct findings. She wants pt to be full code and will discuss with her other sister about ct results.

## 2022-05-05 NOTE — Assessment & Plan Note (Signed)
Chronic constipation Daughters say she h as a daily laxative habit and gets upset when they try to ask her to cut back

## 2022-05-05 NOTE — Telephone Encounter (Signed)
Requested medication (s) are due for refill today: yes  Requested medication (s) are on the active medication list: yes  Last refill:  02/04/22 #90/0  Future visit scheduled: no  Notes to clinic:  see TE from today. Pt currently in hospital and unsure when will be dc'd     Requested Prescriptions  Pending Prescriptions Disp Refills   furosemide (LASIX) 20 MG tablet [Pharmacy Med Name: FUROSEMIDE 20 MG TABLET] 90 tablet 0    Sig: Take 1 tablet (20 mg total) by mouth daily.     Cardiovascular:  Diuretics - Loop Failed - 05/05/2022  9:36 AM      Failed - K in normal range and within 180 days    Potassium  Date Value Ref Range Status  05/04/2022 3.3 (L) 3.5 - 5.1 mmol/L Final  08/04/2014 3.6 3.5 - 5.1 mmol/L Final         Failed - Ca in normal range and within 180 days    Calcium  Date Value Ref Range Status  05/04/2022 7.5 (L) 8.9 - 10.3 mg/dL Final   Calcium, Total  Date Value Ref Range Status  08/04/2014 9.2 8.5 - 10.1 mg/dL Final         Failed - Mg Level in normal range and within 180 days    Magnesium  Date Value Ref Range Status  12/19/2015 2.2 1.7 - 2.4 mg/dL Final         Failed - Last BP in normal range    BP Readings from Last 1 Encounters:  05/05/22 129/81         Passed - Na in normal range and within 180 days    Sodium  Date Value Ref Range Status  05/04/2022 135 135 - 145 mmol/L Final  08/04/2014 140 136 - 145 mmol/L Final         Passed - Cr in normal range and within 180 days    Creatinine  Date Value Ref Range Status  08/04/2014 0.98 0.60 - 1.30 mg/dL Final   Creatinine, Ser  Date Value Ref Range Status  05/04/2022 0.83 0.44 - 1.00 mg/dL Final         Passed - Cl in normal range and within 180 days    Chloride  Date Value Ref Range Status  05/04/2022 105 98 - 111 mmol/L Final  08/04/2014 104 98 - 107 mmol/L Final         Passed - Valid encounter within last 6 months    Recent Outpatient Visits           4 months ago Left ear  impacted cerumen   [facility] Olin Hauser, DO   1 year ago COVID-19 virus infection   [facility] Olin Hauser, DO   1 year ago Generalized anxiety disorder   [facility] Olin Hauser, DO   2 years ago Acute left otitis media   [place], DO   2 years ago Acute cystitis without hematuria   [facility] [name], [place]       Future Appointments             In 1 week Debroah Loop, PA-C [place]   In 2 months  [facility]             amLODipine (NORVASC) 5 MG tablet [Pharmacy Med Name: AMLODIPINE BESYLATE 5 MG TAB] 90 tablet  0    Sig: Take 1 tablet (5 mg total) by mouth daily. NEED APPT FOR FURTHER REFILL PER MD     Cardiovascular: Calcium Channel Blockers 2 Failed - 05/05/2022  9:36 AM      Failed - Last BP in normal range    BP Readings from Last 1 Encounters:  05/05/22 129/81         Passed - Last Heart Rate in normal range    Pulse Readings from Last 1 Encounters:  05/05/22 79         Passed - Valid encounter within last 6 months    Recent Outpatient Visits           4 months ago Left ear impacted cerumen   [facility] Olin Hauser, DO   1 year ago COVID-19 virus infection   [place], DO   1 year ago Generalized anxiety disorder   [place], DO   2 years ago Acute left otitis media   [place], DO   2 years ago Acute cystitis without hematuria   [facility] [name], [place]       Future Appointments             In 1 week Debroah Loop, [place]   In 2 months  [facility], [facility]

## 2022-05-05 NOTE — Progress Notes (Signed)
Admission profile updated. ?

## 2022-05-05 NOTE — Assessment & Plan Note (Signed)
Last Foley catheter exchange was 6/27, changed every 4 weeks

## 2022-05-05 NOTE — Consult Note (Signed)
Charlene Darby, MD [address]  [place]  [place], [place] [address]  Main: [phone]  Fax: [phone] Pager: [phone]   Consultation  Referring Provider:     No ref. provider found Primary Care Physician:  Olin Hauser, DO Primary Gastroenterologist: Althia Forts     Reason for Consultation:     Rectal bleeding  Date of Admission:  05/04/2022 Date of Consultation:  05/05/2022         HPI:   [name] is a 88 y.o. female with history of anxiety, hypertension, osteoporosis presented from home yesterday secondary to 1 episode of large bright red bleeding per rectum.  Patient lives at home with her daughter.  I called her daughter over phone [name] who provided the history.  Patient lives at home, has a caregiver who comes home daily helps with her ADLs.  She cannot manage ADLs independently.  Patient's another daughter was around her yesterday when she had this episode, found to have bright red blood per rectum in the commode and on her chart.  Therefore called EMS and brought her to the ER.  She is found to have drop in hemoglobin to 7.1 today, 7.7 yesterday.  Patient received 1 unit of PRBCs.  Baseline 9.3 about 7 months ago.  She underwent CT angio bleed protocol, which not reveal any active bleeding source, she has mild aortoiliac atherosclerosis, incidentally found to have 2.7 cm solid left renal tumor which has increased in size along with new 1.5 x 0.9 cm nodule in the right lower lobe concerning for metastasis. Ascending, transverse and descending colitis versus nondistention. Patient is telling me that she is scared of dying when interviewed her  According to patient's nurse, she did not have any further episodes of bright red blood per rectum, there was a smear of blood when she was moved around.  Apparently, patient has history of chronic constipation, on opioid, takes laxative as needed.  She was told that she has hemorrhoids, does have  rectal discomfort every time she has a bowel movement associated with straining.  Patient has decreased appetite as well.   NSAIDs: None  Antiplts/Anticoagulants/Anti thrombotics: None  GI Procedures: None  Past Medical History:  Diagnosis Date   Allergy    environmental   Anxiety    Chronic back pain    High cholesterol    History of urinary retention    Hypertension    Migraines    Osteoporosis    Pneumonia    Sinusitis     Past Surgical History:  Procedure Laterality Date   ABDOMINAL HYSTERECTOMY     APPENDECTOMY     BACK SURGERY     BACK SURGERY  2008   2009   BREAST BIOPSY Right 2002   neg   HEMORRHOID SURGERY     MOUTH SURGERY  2019   TONSILLECTOMY       Current Facility-Administered Medications:    0.9 %  sodium chloride infusion (Manually program via Guardrails IV Fluids), , Intravenous, Once, Athena Masse, MD, Held at 05/05/22 0244   0.9 %  sodium chloride infusion (Manually program via Guardrails IV Fluids), , Intravenous, Once, Athena Masse, MD, Stopped at 05/05/22 0930   0.9 %  sodium chloride infusion, , Intravenous, Continuous, Judd Gaudier V, MD, Last Rate: 125 mL/hr at 05/05/22 0242, New Bag at 05/05/22 0242   acetaminophen (TYLENOL) tablet 650 mg, 650 mg, Oral, Q6H PRN **OR** acetaminophen (TYLENOL) suppository 650 mg,  650 mg, Rectal, Q6H PRN, Athena Masse, MD   FLUoxetine (PROZAC) capsule 20 mg, 20 mg, Oral, Daily, Judd Gaudier V, MD, 20 mg at 05/05/22 0928   HYDROcodone-acetaminophen (NORCO) 10-325 MG per tablet 1 tablet, 1 tablet, Oral, Q4H PRN, Athena Masse, MD, 1 tablet at 05/05/22 1037   levothyroxine (SYNTHROID) tablet 88 mcg, 88 mcg, Oral, Q0600, Athena Masse, MD, 88 mcg at 05/05/22 0635   LORazepam (ATIVAN) tablet 0.5 mg, 0.5 mg, Oral, QID PRN, Athena Masse, MD, 0.5 mg at 05/05/22 0928   mirabegron ER (MYRBETRIQ) tablet 25 mg, 25 mg, Oral, Daily, Judd Gaudier V, MD, 25 mg at 05/05/22 9030   mirtazapine (REMERON) tablet 30  mg, 30 mg, Oral, QHS, Athena Masse, MD   ondansetron (ZOFRAN) tablet 4 mg, 4 mg, Oral, Q6H PRN **OR** ondansetron (ZOFRAN) injection 4 mg, 4 mg, Intravenous, Q6H PRN, Athena Masse, MD  Current Outpatient Medications:    amLODipine (NORVASC) 5 MG tablet, Take 1 tablet (5 mg total) by mouth daily. NEED APPT FOR FURTHER REFILL PER MD, Disp: 90 tablet, Rfl: 0   carvedilol (COREG) 6.25 MG tablet, Take 1 tablet (6.25 mg total) by mouth 2 (two) times daily with a meal., Disp: 180 tablet, Rfl: 0   FLUoxetine (PROZAC) 20 MG capsule, Take 1 capsule (20 mg total) by mouth daily., Disp: 90 capsule, Rfl: 0   furosemide (LASIX) 20 MG tablet, Take 1 tablet (20 mg total) by mouth daily., Disp: 90 tablet, Rfl: 0   HYDROcodone-acetaminophen (NORCO) 10-325 MG tablet, Take 1 tablet by mouth every 4 (four) hours as needed for moderate pain. six tablets a day every four hours --per pain management, Disp: , Rfl:    levothyroxine (SYNTHROID) 88 MCG tablet, Take 1 tablet (88 mcg total) by mouth once daily Monday-Saturday.  Take 1/2 tab each week on Sunday., Disp: , Rfl:    LORazepam (ATIVAN) 0.5 MG tablet, Take 1 tablet (0.5 mg total) by mouth 4 (four) times daily as needed for anxiety., Disp: 120 tablet, Rfl: 2   mirtazapine (REMERON) 30 MG tablet, Take 1 tablet (30 mg total) by mouth at bedtime., Disp: 30 tablet, Rfl: 2   Vibegron (GEMTESA) 75 MG TABS, Take 75 mg by mouth daily., Disp: 42 tablet, Rfl: 0   Family History  Problem Relation Age of Onset   Breast cancer Sister 69   Breast cancer Maternal Grandmother 72   Breast cancer Sister 12   Bladder Cancer Brother    Hypertension Neg Hx    Kidney cancer Neg Hx      Social History   Tobacco Use   Smoking status: Never    Passive exposure: Never   Smokeless tobacco: Never  Vaping Use   Vaping Use: Never used  Substance Use Topics   Alcohol use: No   Drug use: No    Allergies as of 05/04/2022 - Review Complete 05/04/2022  Allergen Reaction Noted    Amoxicillin Itching, Swelling, and Rash 10/18/2019   Nitrofurantoin  08/15/2020   Ceftin [cefuroxime]  08/04/2017   Cephalosporins  07/17/2020   Cortisone Other (See Comments) 08/12/2015   Fluticasone Swelling 04/02/2014   Levofloxacin Nausea And Vomiting 11/23/2014   Meperidine Other (See Comments) 06/11/2014   Morphine and related Other (See Comments) 08/12/2015   Morphine sulfate  06/28/2017   Nisoldipine  06/28/2017   Penicillin g potassium in d5w  06/28/2017   Prednisone Other (See Comments) 08/12/2015   Sulfa antibiotics Nausea Only 08/12/2015  Cephalexin Swelling 10/30/2019   Ciprofloxacin Rash 09/29/2019    Review of Systems:    All systems reviewed and negative except where noted in HPI.   Physical Exam:  Vital signs in last 24 hours: Temp:  [98 F (36.7 C)-99.1 F (37.3 C)] 98.7 F (37.1 C) (07/26 0809) Pulse Rate:  [62-96] 84 (07/26 1000) Resp:  [13-31] 20 (07/26 1000) BP: (84-149)/(51-97) 130/81 (07/26 1000) SpO2:  [90 %-97 %] 93 % (07/26 1000) Weight:  [40.8 kg] 40.8 kg (07/25 2201)   General: Appearing, thin built, very hard of hearing Head:  Normocephalic and atraumatic. Eyes:   No icterus.   Conjunctiva pink. PERRLA. Ears:  Normal auditory acuity. Neck:  Supple; no masses or thyroidomegaly Lungs: Respirations even and unlabored. Lungs clear to auscultation bilaterally.   No wheezes, crackles, or rhonchi.  Heart:  Regular rate and rhythm;  Without murmur, clicks, rubs or gallops Abdomen:  Soft, nondistended, nontender. Normal bowel sounds. No appreciable masses or hepatomegaly.  No rebound or guarding.  Rectal:  Not performed. Msk:  Symmetrical without gross deformities.  \Generalized weakness Extremities:  Without edema, cyanosis or clubbing. Neurologic:  Alert and oriented x1; Skin:  Intact without significant lesions or rashes. Psych:  Alert and cooperative. Normal affect.  LAB RESULTS:    Latest Ref Rng & Units 05/05/2022    2:31 AM 05/04/2022    10:09 PM 10/02/2021    1:05 PM  CBC  WBC 4.0 - 10.5 K/uL  8.5  4.1   Hemoglobin 12.0 - 15.0 g/dL 7.1  7.7  9.3   Hematocrit 36.0 - 46.0 % 22.7  26.0  30.4   Platelets 150 - 400 K/uL  168  128     BMET    Latest Ref Rng & Units 05/04/2022   10:09 PM 10/02/2021    1:05 PM 04/16/2020    6:30 PM  BMP  Glucose 70 - 99 mg/dL 165  99  96   BUN 8 - 23 mg/dL _0 Creatinine 0.44 - 1.00 mg/dL 0.83  0.65  0.61   Sodium 135 - 145 mmol/L 135  138  138   Potassium 3.5 - 5.1 mmol/L 3.3  2.9  3.4   Chloride 98 - 111 mmol/L 105  101  102   CO2 22 - 32 mmol/L 24  32  27   Calcium 8.9 - 10.3 mg/dL 7.5  8.5  9.6     LFT    Latest Ref Rng & Units 05/04/2022   10:09 PM 04/16/2020    6:30 PM 09/29/2019    3:30 PM  Hepatic Function  Total Protein 6.5 - 8.1 g/dL 5.9  6.9  6.1   Albumin 3.5 - 5.0 g/dL 3.1  4.0  3.6   AST 15 - 41 U/L _1 ALT 0 - 44 U/L _2 Alk Phosphatase 38 - 126 U/L 56  74  60   Total Bilirubin 0.3 - 1.2 mg/dL 1.0  0.8  1.6      STUDIES: CT ANGIO GI BLEED  Result Date: 05/05/2022 CLINICAL DATA:  GI bleeding.  88 year old female. EXAM: CTA ABDOMEN AND PELVIS WITHOUT AND WITH CONTRAST TECHNIQUE: Multidetector CT imaging of the abdomen and pelvis was performed using the standard protocol during bolus administration of intravenous contrast. Multiplanar reconstructed images and MIPs were obtained and reviewed to evaluate the vascular anatomy. RADIATION DOSE REDUCTION: This exam was performed according  to the departmental dose-optimization program which includes automated exposure control, adjustment of the mA and/or kV according to patient size and/or use of iterative reconstruction technique. CONTRAST:  72m OMNIPAQUE IOHEXOL 350 MG/ML SOLN COMPARISON:  CT abdomen and pelvis with IV contrast 08/22/2018. FINDINGS: VASCULAR Aorta: The abdominal aorta is tortuous with minimal scattered atherosclerotic calcifications. There is no stenosis, dissection or aneurysm.  Celiac: Patent without evidence of aneurysm, dissection, vasculitis or significant stenosis. There is nonstenosing ostial calcification. SMA: Patent without evidence of aneurysm, dissection, vasculitis or significant stenosis. Renals: Both renal arteries are single. There are bilateral nonstenosing ostial calcifications. There is no renal artery stenosis, dissection or aneurysm. IMA: Patent without evidence of aneurysm, dissection, vasculitis or significant stenosis. Inflow: Both common iliac arteries are tortuous. There is tortuosity and scattered calcification in the internal iliac arteries. There is no flow-limiting stenosis, dissection or aneurysm in the bilateral inflow vessels. Both external iliac arteries are clear. Proximal Outflow: Bilateral common femoral and visualized portions of the superficial and profunda femoral arteries are patent without evidence of aneurysm, dissection, vasculitis or significant stenosis. There is nonstenosing mixed plaque in both common femoral arteries Veins: Unremarkable. Review of the MIP images confirms the above findings. NON-VASCULAR Technical note: There is abundant streak artifact in the abdomen and pelvis limiting evaluation of the soft tissues. This arises from L3-4 bilateral posterior spinal fusion hardware, and from the patient's arms overlying the field. Lower chest: Lung bases show atelectasis and scarring changes. Moderate to severe panchamber cardiomegaly with slightly increased, prominent 3.4 cm pulmonary trunk indicating arterial hypertension and minimal calcification coronary arteries. There is new demonstration of a pleural-based ovoid 1.5 x 0.8 cm right lower lobe noncalcified nodule. Per Fleischner Society Guidelines, recommend prompt non-contrast Chest CT for further evaluation, but please see below comments. These guidelines do not apply to immunocompromised patients and patients with cancer. Follow up in patients with significant comorbidities as  clinically warranted. For lung cancer screening, adhere to Lung-RADS guidelines. Reference: Radiology. 2017; 284(1):228-43. Hepatobiliary: No visible focal hepatic abnormality allowing for artifacts. Gallbladder and bile ducts are unremarkable. Pancreas: Not well seen but there is no obvious mass or ductal dilatation. Spleen: Normal in size with homogeneous enhancement. Adrenals/Urinary Tract: There is no adrenal mass. There are small scattered renal cysts. Posteriorly in the upper pole left kidney there is an enhancing, slightly heterogeneous solid tumor of the cortex measuring 2.7 cm consistent with a renal cell carcinoma until proven otherwise. Previously this measured 2.0 cm. There is no urinary stone or obstruction and no renal vein tumor thrombus. There is air in the bladder and mild diffuse thickening of the bladder. The bladder is catheterized. It is not fully decompressed which may indicate catheter dysfunction. Stomach/Bowel: No contrast extravasation is seen in the stomach and bowel. The gastric wall is unremarkable. Unopacified small bowel demonstrate no dilatation. There is no pneumatosis. An appendix is not seen in this patient. There is mild thickening or underdistention in the ascending and transverse colon and proximal descending colon. The rectosigmoid segment, as visualized is unremarkable. Lymphatic: There is no appreciable adenopathy in the abdomen or pelvis. Reproductive: Status post hysterectomy. No adnexal masses. There are numerous pelvic phleboliths. Other: There is mild diffuse edema in the body wall and mesentery which is probably congestive, not seen in 2019, or could be due to fluid overload. There is minimal ascites in the posterior deep pelvis. There is no free air, hemorrhage or abscess. Musculoskeletal: There is osteopenia with degenerative changes of the thoracic  and lumbar spine, chronic L3-5 bilateral posterior fusion hardware, and interval new finding moderate generalized L5  vertebral height loss with marrow heterogeneity which could be due to healing response from a recent compression fracture or due to metastatic involvement of the segment. There is minimal posterior cortical retropulsion. I do not see further evidence of focal marrow lesions but CT is insensitive for this. There is chronic partial height loss along the lower plate of L3 which is unchanged. There is a mildly displaced oblique fracture line through the posterior column of the L5 body. IMPRESSION: VASCULAR 1. Mild aortoiliac atherosclerosis with nonstenosing calcifications in the visceral arteries. No flow-limiting visceral arterial, inflow or proximal outflow vessel stenosis is seen. 2. No arterial or venous contrast extravasation is visible in the bowel. 3. Moderate to severe cardiomegaly, slightly increased from 2019. 4. Prominence of the pulmonary trunk indicating arterial hypertension. Trace CAD. NON-VASCULAR 1. Body wall and mesenteric edema likely congestive or from fluid overload. Minimal ascites. 2. Ascending, transverse and descending colitis versus nondistention. 3. Bladder catheterized with findings of cystitis or nondistention. Correlate clinically for catheter dysfunction as the bladder is not fully decompressed. 4. 2.7 cm enhancing solid left renal tumor, previously 2 cm, findings consistent with renal cell carcinoma until proven otherwise. No associated adenopathy. No renal vein tumor thrombus. Small scattered renal cysts. 5. New 1.5 x 0.9 cm nodule in the posterior basal right lower lobe, potentially metastasis given the finding of a left renal mass. Fleischner guidelines would ordinarily indicate prompt noncontrast chest CT for further evaluation for additional nodules. Further evaluation in this case however, should take into account advanced age and life expectancy as well as the likelihood of surgical intervention based on further evaluation. 6. There is a moderate L5 compression fracture with  slight retropulsion, and marrow heterogeneity which could indicate healing response from recent injury or could be due to metastatic involvement of this vertebral body resulting in a pathologic fracture. If clinically warranted, MRI may be helpful. 7. Osteopenia with degenerative and postsurgical changes without other evidence of regional bony metastasis. 8. Remaining findings discussed above. Electronically Signed   By: Telford Nab M.D.   On: 05/05/2022 00:16      Impression / Plan:   [name] is a 88 y.o. female with history of anxiety, hypertension, hypothyroidism, chronic pain, on chronic opioid medication presented with 1 episode of rectal bleeding and acute blood loss anemia  Rectal bleeding, acute blood loss anemia: Patient did not have any further episodes since overnight CT angio protocol was negative for active extravasation Differentials include either bleeding from hemorrhoids or stercoral ulcer or ischemic colitis I had an extensive discussion with patient's daughter, [name] regarding watchful waiting, conservative management versus aggressive measures including putting her through colonoscopy under anesthesia.  Given overall patient's medical condition, patient's daughter choose to continue supportive care and defer any endoscopy procedures at this time which I think is appropriate Monitor CBC closely, transfuse PRBCs as needed Check iron panel, B12 and folate levels Resume diet Recommend regular bowel regimen, start MiraLAX 17 g daily  left renal tumor and new lung nodule concerning for metastasis   Thank you for involving me in the care of this patient.      LOS: 0 days   Sherri Sear, MD  05/05/2022, 11:16 AM    Note: This dictation was prepared with Dragon dictation along with smaller phrase technology. Any transcriptional errors that result from this process are unintentional.

## 2022-05-05 NOTE — Telephone Encounter (Signed)
Pt called, spoke with pt's daughter [name]. Pt is currently in hospital for lower GI bleed and found a mass. [name] states they are keeping her for a couple of days. Advised her of needing appt and updated labs for med refill. She states that if labs can be used from ED would be great and if pt has to come in for OV they can try to make arrangements once pt is back home but would be able to do telephone visit as well. Mariana Single to just call office back once pt comes home and is settled back down. She verbalized understanding.

## 2022-05-05 NOTE — Assessment & Plan Note (Signed)
IV repletion and monitor

## 2022-05-05 NOTE — Assessment & Plan Note (Addendum)
Hypotension Hold antihypertensives as systolic was in the 12X on arrival

## 2022-05-05 NOTE — H&P (Signed)
History and Physical    Patient: [name] TKZ:[id number] DOB: [birth year] DOA: 05/04/2022 DOS: the patient was seen and examined on 05/05/2022 PCP: Olin Hauser, DO  Patient coming from: Home  Chief Complaint:  Chief Complaint  Patient presents with   Rectal Bleeding    Per EMS pt is currently bleeding from rectal and chronic foley. A&O. N/V     HPI: [name] is a 88 y.o. female with medical history significant for HTN, anxiety, postsurgical hypothyroidism, chronic urinary retention with chronic indwelling Foley catheter and with remote history of ischemic colitis in 2005 who presents to the ED with rectal bleeding associated with abdominal cramping and fatigue.  EMS reports seeing a commode half filled with blood per ED providers note.  SBP with EMS was in the 70s. ED course and data review: BP 84/55 on arrival with pulse of 70, improving to 128/69 with IV fluid bolus Labs: Hemoglobin 7.7, down from 9.3 about 9 months prior, potassium 3.3, WBC 8.5 and lactic acid 1.1 CTA abdomen and pelvis shows the following IMPRESSION: VASCULAR   1. Mild aortoiliac atherosclerosis with nonstenosing calcifications in the visceral arteries. No flow-limiting visceral arterial, inflow or proximal outflow vessel stenosis is seen. 2. No arterial or venous contrast extravasation is visible in the bowel. 3. Moderate to severe cardiomegaly, slightly increased from 2019. 4. Prominence of the pulmonary trunk indicating arterial hypertension. Trace CAD.   NON-VASCULAR   1. Body wall and mesenteric edema likely congestive or from fluid overload. Minimal ascites. 2. Ascending, transverse and descending colitis versus nondistention. 3. Bladder catheterized with findings of cystitis or nondistention. Correlate clinically for catheter dysfunction as the bladder is not fully decompressed. 4. 2.7 cm enhancing solid left renal tumor, previously 2 cm, findings consistent with renal  cell carcinoma until proven otherwise. No associated adenopathy. No renal vein tumor thrombus. Small scattered renal cysts. 5. New 1.5 x 0.9 cm nodule in the posterior basal right lower lobe, potentially metastasis given the finding of a left renal mass. Fleischner guidelines would ordinarily indicate prompt noncontrast chest CT for further evaluation for additional nodules. Further evaluation in this case however, should take into account advanced age and life expectancy as well as the likelihood of surgical intervention based on further evaluation. 6. There is a moderate L5 compression fracture with slight retropulsion, and marrow heterogeneity which could indicate healing response from recent injury or could be due to metastatic involvement of this vertebral body resulting in a pathologic fracture. If clinically warranted, MRI may be helpful. 7. Osteopenia with degenerative and postsurgical changes without other evidence of regional bony metastasis. 8. Remaining findings discussed above. IMPRESSION: VASCULAR   1. Mild aortoiliac atherosclerosis with nonstenosing calcifications in the visceral arteries. No flow-limiting visceral arterial, inflow or proximal outflow vessel stenosis is seen. 2. No arterial or venous contrast extravasation is visible in the bowel. 3. Moderate to severe cardiomegaly, slightly increased from 2019. 4. Prominence of the pulmonary trunk indicating arterial hypertension. Trace CAD.   NON-VASCULAR   1. Body wall and mesenteric edema likely congestive or from fluid overload. Minimal ascites. 2. Ascending, transverse and descending colitis versus nondistention. 3. Bladder catheterized with findings of cystitis or nondistention. Correlate clinically for catheter dysfunction as the bladder is not fully decompressed. 4. 2.7 cm enhancing solid left renal tumor, previously 2 cm, findings consistent with renal cell carcinoma until proven otherwise. No  associated adenopathy. No renal vein tumor thrombus. Small scattered renal cysts. 5. New 1.5 x 0.9 cm  nodule in the posterior basal right lower lobe, potentially metastasis given the finding of a left renal mass. Fleischner guidelines would ordinarily indicate prompt noncontrast chest CT for further evaluation for additional nodules. Further evaluation in this case however, should take into account advanced age and life expectancy as well as the likelihood of surgical intervention based on further evaluation. 6. There is a moderate L5 compression fracture with slight retropulsion, and marrow heterogeneity which could indicate healing response from recent injury or could be due to metastatic involvement of this vertebral body resulting in a pathologic fracture. If clinically warranted, MRI may be helpful. 7. Osteopenia with degenerative and postsurgical changes without other evidence of regional bony metastasis. 8. Remaining findings discussed above.   .  Patient given an IV fluid bolus.  Hospitalist consulted for admission     Past Medical History:  Diagnosis Date   Allergy    environmental   Anxiety    Chronic back pain    High cholesterol    History of urinary retention    Hypertension    Migraines    Osteoporosis    Pneumonia    Sinusitis    Past Surgical History:  Procedure Laterality Date   ABDOMINAL HYSTERECTOMY     APPENDECTOMY     BACK SURGERY     BACK SURGERY  2008   2009   BREAST BIOPSY Right 2002   neg   HEMORRHOID SURGERY     MOUTH SURGERY  2019   TONSILLECTOMY     Social History:  reports that she has never smoked. She has never been exposed to tobacco smoke. She has never used smokeless tobacco. She reports that she does not drink alcohol and does not use drugs.  Allergies  Allergen Reactions   Amoxicillin Itching, Swelling and Rash    Anaphylactoid reaction occurred around the time that the patient took both Amoxicillin and Cipro. ED notes state  this reaction was to the Amoxicillin, patient's family reports it was to the Cipro.   Nitrofurantoin     swelling   Ceftin [Cefuroxime]    Cephalosporins    Cortisone Other (See Comments)    Headache    Fluticasone Swelling    Facial   Levofloxacin Nausea And Vomiting   Meperidine Other (See Comments)   Morphine And Related Other (See Comments)    headache   Morphine Sulfate    Nisoldipine    Penicillin G Potassium In D5w    Prednisone Other (See Comments)    Headache    Sulfa Antibiotics Nausea Only   Cephalexin Swelling    Lip swelling with Keflex prescribed in January 2021. Family administered benadryl.   Ciprofloxacin Rash    Family History  Problem Relation Age of Onset   Breast cancer Sister 70   Breast cancer Maternal Grandmother 48   Breast cancer Sister 95   Bladder Cancer Brother    Hypertension Neg Hx    Kidney cancer Neg Hx     Prior to Admission medications   Medication Sig Start Date End Date Taking? Authorizing Provider  amLODipine (NORVASC) 5 MG tablet Take 1 tablet (5 mg total) by mouth daily. NEED APPT FOR FURTHER REFILL PER MD 02/04/22   Olin Hauser, DO  calcitRIOL (ROCALTROL) 1 MCG/ML solution  08/03/20   [provider]  carvedilol (COREG) 6.25 MG tablet Take 1 tablet (6.25 mg total) by mouth 2 (two) times daily with a meal. 04/07/22   Karamalegos, [name], DO  FLUoxetine (PROZAC)  20 MG capsule Take 1 capsule (20 mg total) by mouth daily. 04/06/22   Karamalegos, [name], DO  furosemide (LASIX) 20 MG tablet Take 1 tablet (20 mg total) by mouth daily. 02/04/22   Karamalegos, [name], DO  HYDROcodone-acetaminophen (NORCO) 10-325 MG tablet Take 1 tablet by mouth every 4 (four) hours as needed for moderate pain. six tablets a day every four hours --per pain management    [provider]  levothyroxine (SYNTHROID) 25 MCG tablet  08/03/20   [provider]  LORazepam (ATIVAN) 0.5 MG tablet Take 1 tablet (0.5 mg  total) by mouth 4 (four) times daily as needed for anxiety. 02/04/22   Karamalegos, [name], DO  mirtazapine (REMERON) 30 MG tablet Take 1 tablet (30 mg total) by mouth at bedtime. 03/17/22   Karamalegos, [name], DO  Vibegron (GEMTESA) 75 MG TABS Take 75 mg by mouth daily. 03/09/22   Debroah Loop, PA-C    Physical Exam: Vitals:   05/05/22 0130 05/05/22 0200 05/05/22 0230 05/05/22 0248  BP: 106/88 124/77 115/77   Pulse:   78   Resp: (!) 21 17 (!) 22   Temp:    98.2 F (36.8 C)  TempSrc:    Oral  SpO2:   93%   Weight:      Height:       Physical Exam Vitals and nursing note reviewed.  Constitutional:      General: She is not in acute distress.    Comments: Hard of hearing  HENT:     Head: Normocephalic and atraumatic.  Cardiovascular:     Rate and Rhythm: Normal rate and regular rhythm.     Heart sounds: Normal heart sounds.  Pulmonary:     Effort: Pulmonary effort is normal.     Breath sounds: Normal breath sounds.  Abdominal:     Palpations: Abdomen is soft.     Tenderness: There is no abdominal tenderness.  Neurological:     Mental Status: Mental status is at baseline.     Labs on Admission: I have personally reviewed following labs and imaging studies  CBC: Recent Labs  Lab 05/04/22 2209 05/05/22 0231  WBC 8.5  --   NEUTROABS 6.4  --   HGB 7.7* 7.1*  HCT 26.0* 22.7*  MCV 87.0  --   PLT 168  --    Basic Metabolic Panel: Recent Labs  Lab 05/04/22 2209  NA 135  K 3.3*  CL 105  CO2 24  GLUCOSE 165*  BUN 15  CREATININE 0.83  CALCIUM 7.5*   GFR: Estimated Creatinine Clearance: 26.7 mL/min (by C-G formula based on SCr of 0.83 mg/dL). Liver Function Tests: Recent Labs  Lab 05/04/22 2209  AST 23  ALT 10  ALKPHOS 56  BILITOT 1.0  PROT 5.9*  ALBUMIN 3.1*   Recent Labs  Lab 05/04/22 2209  LIPASE 32   No results for input(s): "AMMONIA" in the last 168 hours. Coagulation Profile: Recent Labs  Lab 05/04/22 2209  INR 1.2    Cardiac Enzymes: No results for input(s): "CKTOTAL", "CKMB", "CKMBINDEX", "TROPONINI" in the last 168 hours. BNP (last 3 results) No results for input(s): "PROBNP" in the last 8760 hours. HbA1C: No results for input(s): "HGBA1C" in the last 72 hours. CBG: No results for input(s): "GLUCAP" in the last 168 hours. Lipid Profile: No results for input(s): "CHOL", "HDL", "LDLCALC", "TRIG", "CHOLHDL", "LDLDIRECT" in the last 72 hours. Thyroid Function Tests: No results for input(s): "TSH", "T4TOTAL", "FREET4", "T3FREE", "THYROIDAB"  in the last 72 hours. Anemia Panel: No results for input(s): "VITAMINB12", "FOLATE", "FERRITIN", "TIBC", "IRON", "RETICCTPCT" in the last 72 hours. Urine analysis:    Component Value Date/Time   COLORURINE YELLOW 10/02/2021 1407   APPEARANCEUR CLEAR (A) 10/02/2021 1407   APPEARANCEUR Cloudy (A) 08/06/2021 1605   LABSPEC 1.015 10/02/2021 1407   LABSPEC 1.004 08/05/2014 0115   PHURINE 7.0 10/02/2021 1407   GLUCOSEU NEGATIVE 10/02/2021 1407   GLUCOSEU Negative 08/05/2014 0115   HGBUR NEGATIVE 10/02/2021 1407   BILIRUBINUR NEGATIVE 10/02/2021 1407   BILIRUBINUR Negative 08/06/2021 1605   BILIRUBINUR Negative 08/05/2014 0115   KETONESUR NEGATIVE 10/02/2021 1407   PROTEINUR NEGATIVE 10/02/2021 1407   UROBILINOGEN 0.2 06/26/2019 1322   UROBILINOGEN 0.2 03/07/2008 1048   NITRITE POSITIVE (A) 10/02/2021 1407   LEUKOCYTESUR TRACE (A) 10/02/2021 1407   LEUKOCYTESUR Negative 08/05/2014 0115    Radiological Exams on Admission: CT ANGIO GI BLEED  Result Date: 05/05/2022 CLINICAL DATA:  GI bleeding.  88 year old female. EXAM: CTA ABDOMEN AND PELVIS WITHOUT AND WITH CONTRAST TECHNIQUE: Multidetector CT imaging of the abdomen and pelvis was performed using the standard protocol during bolus administration of intravenous contrast. Multiplanar reconstructed images and MIPs were obtained and reviewed to evaluate the vascular anatomy. RADIATION DOSE REDUCTION: This exam  was performed according to the departmental dose-optimization program which includes automated exposure control, adjustment of the mA and/or kV according to patient size and/or use of iterative reconstruction technique. CONTRAST:  32m OMNIPAQUE IOHEXOL 350 MG/ML SOLN COMPARISON:  CT abdomen and pelvis with IV contrast 08/22/2018. FINDINGS: VASCULAR Aorta: The abdominal aorta is tortuous with minimal scattered atherosclerotic calcifications. There is no stenosis, dissection or aneurysm. Celiac: Patent without evidence of aneurysm, dissection, vasculitis or significant stenosis. There is nonstenosing ostial calcification. SMA: Patent without evidence of aneurysm, dissection, vasculitis or significant stenosis. Renals: Both renal arteries are single. There are bilateral nonstenosing ostial calcifications. There is no renal artery stenosis, dissection or aneurysm. IMA: Patent without evidence of aneurysm, dissection, vasculitis or significant stenosis. Inflow: Both common iliac arteries are tortuous. There is tortuosity and scattered calcification in the internal iliac arteries. There is no flow-limiting stenosis, dissection or aneurysm in the bilateral inflow vessels. Both external iliac arteries are clear. Proximal Outflow: Bilateral common femoral and visualized portions of the superficial and profunda femoral arteries are patent without evidence of aneurysm, dissection, vasculitis or significant stenosis. There is nonstenosing mixed plaque in both common femoral arteries Veins: Unremarkable. Review of the MIP images confirms the above findings. NON-VASCULAR Technical note: There is abundant streak artifact in the abdomen and pelvis limiting evaluation of the soft tissues. This arises from L3-4 bilateral posterior spinal fusion hardware, and from the patient's arms overlying the field. Lower chest: Lung bases show atelectasis and scarring changes. Moderate to severe panchamber cardiomegaly with slightly increased,  prominent 3.4 cm pulmonary trunk indicating arterial hypertension and minimal calcification coronary arteries. There is new demonstration of a pleural-based ovoid 1.5 x 0.8 cm right lower lobe noncalcified nodule. Per Fleischner Society Guidelines, recommend prompt non-contrast Chest CT for further evaluation, but please see below comments. These guidelines do not apply to immunocompromised patients and patients with cancer. Follow up in patients with significant comorbidities as clinically warranted. For lung cancer screening, adhere to Lung-RADS guidelines. Reference: Radiology. 2017; 284(1):228-43. Hepatobiliary: No visible focal hepatic abnormality allowing for artifacts. Gallbladder and bile ducts are unremarkable. Pancreas: Not well seen but there is no obvious mass or ductal dilatation. Spleen: Normal in size with homogeneous  enhancement. Adrenals/Urinary Tract: There is no adrenal mass. There are small scattered renal cysts. Posteriorly in the upper pole left kidney there is an enhancing, slightly heterogeneous solid tumor of the cortex measuring 2.7 cm consistent with a renal cell carcinoma until proven otherwise. Previously this measured 2.0 cm. There is no urinary stone or obstruction and no renal vein tumor thrombus. There is air in the bladder and mild diffuse thickening of the bladder. The bladder is catheterized. It is not fully decompressed which may indicate catheter dysfunction. Stomach/Bowel: No contrast extravasation is seen in the stomach and bowel. The gastric wall is unremarkable. Unopacified small bowel demonstrate no dilatation. There is no pneumatosis. An appendix is not seen in this patient. There is mild thickening or underdistention in the ascending and transverse colon and proximal descending colon. The rectosigmoid segment, as visualized is unremarkable. Lymphatic: There is no appreciable adenopathy in the abdomen or pelvis. Reproductive: Status post hysterectomy. No adnexal masses.  There are numerous pelvic phleboliths. Other: There is mild diffuse edema in the body wall and mesentery which is probably congestive, not seen in 2019, or could be due to fluid overload. There is minimal ascites in the posterior deep pelvis. There is no free air, hemorrhage or abscess. Musculoskeletal: There is osteopenia with degenerative changes of the thoracic and lumbar spine, chronic L3-5 bilateral posterior fusion hardware, and interval new finding moderate generalized L5 vertebral height loss with marrow heterogeneity which could be due to healing response from a recent compression fracture or due to metastatic involvement of the segment. There is minimal posterior cortical retropulsion. I do not see further evidence of focal marrow lesions but CT is insensitive for this. There is chronic partial height loss along the lower plate of L3 which is unchanged. There is a mildly displaced oblique fracture line through the posterior column of the L5 body. IMPRESSION: VASCULAR 1. Mild aortoiliac atherosclerosis with nonstenosing calcifications in the visceral arteries. No flow-limiting visceral arterial, inflow or proximal outflow vessel stenosis is seen. 2. No arterial or venous contrast extravasation is visible in the bowel. 3. Moderate to severe cardiomegaly, slightly increased from 2019. 4. Prominence of the pulmonary trunk indicating arterial hypertension. Trace CAD. NON-VASCULAR 1. Body wall and mesenteric edema likely congestive or from fluid overload. Minimal ascites. 2. Ascending, transverse and descending colitis versus nondistention. 3. Bladder catheterized with findings of cystitis or nondistention. Correlate clinically for catheter dysfunction as the bladder is not fully decompressed. 4. 2.7 cm enhancing solid left renal tumor, previously 2 cm, findings consistent with renal cell carcinoma until proven otherwise. No associated adenopathy. No renal vein tumor thrombus. Small scattered renal cysts. 5. New  1.5 x 0.9 cm nodule in the posterior basal right lower lobe, potentially metastasis given the finding of a left renal mass. Fleischner guidelines would ordinarily indicate prompt noncontrast chest CT for further evaluation for additional nodules. Further evaluation in this case however, should take into account advanced age and life expectancy as well as the likelihood of surgical intervention based on further evaluation. 6. There is a moderate L5 compression fracture with slight retropulsion, and marrow heterogeneity which could indicate healing response from recent injury or could be due to metastatic involvement of this vertebral body resulting in a pathologic fracture. If clinically warranted, MRI may be helpful. 7. Osteopenia with degenerative and postsurgical changes without other evidence of regional bony metastasis. 8. Remaining findings discussed above. Electronically Signed   By: Telford Nab M.D.   On: 05/05/2022 00:16  Data Reviewed: Relevant notes from primary care and specialist visits, past discharge summaries as available in EHR, including Care Everywhere. Prior diagnostic testing as pertinent to current admission diagnoses Updated medications and problem lists for reconciliation ED course, including vitals, labs, imaging, treatment and response to treatment Triage notes, nursing and pharmacy notes and ED provider's notes Notable results as noted in HPI   Assessment and Plan: * Hematochezia Acute blood loss anemia Acute colitis on CT History of ischemic colitis 2005 History of hemorrhoids CTA: No arterial or venous contrast extravasation is visible in the Bowel.Marland KitchenMarland KitchenAscending, transverse and descending colitis versus nondistention. Hb 9.3-7.7-7.1 Daughters agreed to only 1 unit PRBC . GI consult Close hemodynamic monitoring  Laxative habit Chronic constipation Daughters say she h as a daily laxative habit and gets upset when they try to ask her to cut back   Renal mass,  left LLL nodule 2.7 cm enhancing solid left renal tumor, previously 2 cm, findings consistent with renal cell carcinoma until proven otherwise. No associated adenopathy. No renal vein tumor thrombus. Small scattered renal cysts. 5. New 1.5 x 0.9 cm nodule in the posterior basal right lower lobe, potentially metastasis given the finding of a left renal mass. Fleischner guidelines would ordinarily indicate prompt noncontrast chest CT for further evaluation for additional nodules. Further evaluation in this case however, should take into account advanced age and life expectancy as well as the likelihood of surgical intervention based on further evaluation.  Discussed with daughters at bedside  Consider oncology consult vs referral. Patient is a full code  Chronic indwelling Foley catheter Last Foley catheter exchange was 6/27, changed every 4 weeks  Postsurgical hypothyroidism, hx of thyroid cancer Levothyroxine to resume  Anxiety As needed Ativan.  Fluoxetine and mirtazapine with small sips  Hypokalemia IV repletion and monitor  Essential (primary) hypertension Hypotension Hold antihypertensives as systolic was in the 93T on arrival        DVT prophylaxis: SCD  Consults: GI, Dr. Marius Ditch  Advance Care Planning:   Code Status: Full Code   Family Communication: daughters and HPOA at  bedside  Disposition Plan: Back to previous home environment  Severity of Illness: The appropriate patient status for this patient is INPATIENT. Inpatient status is judged to be reasonable and necessary in order to provide the required intensity of service to ensure the patient's safety. The patient's presenting symptoms, physical exam findings, and initial radiographic and laboratory data in the context of their chronic comorbidities is felt to place them at high risk for further clinical deterioration. Furthermore, it is not anticipated that the patient will be medically stable for discharge  from the hospital within 2 midnights of admission.   * I certify that at the point of admission it is my clinical judgment that the patient will require inpatient hospital care spanning beyond 2 midnights from the point of admission due to high intensity of service, high risk for further deterioration and high frequency of surveillance required.*  Author: Athena Masse, MD 05/05/2022 3:34 AM  For on call review [email or web address].

## 2022-05-05 NOTE — Assessment & Plan Note (Signed)
As needed Ativan.  Fluoxetine and mirtazapine with small sips

## 2022-05-05 NOTE — Assessment & Plan Note (Addendum)
LLL nodule 2.7 cm enhancing solid left renal tumor, previously 2 cm, findings consistent with renal cell carcinoma until proven otherwise. No associated adenopathy. No renal vein tumor thrombus. Small scattered renal cysts. 5. New 1.5 x 0.9 cm nodule in the posterior basal right lower lobe, potentially metastasis given the finding of a left renal mass. Fleischner guidelines would ordinarily indicate prompt noncontrast chest CT for further evaluation for additional nodules. Further evaluation in this case however, should take into account advanced age and life expectancy as well as the likelihood of surgical intervention based on further evaluation.  Discussed with daughters at bedside  Consider oncology consult vs referral. Patient is a full code

## 2022-05-05 NOTE — Assessment & Plan Note (Signed)
Levothyroxine to resume

## 2022-05-06 DIAGNOSIS — K921 Melena: Secondary | ICD-10-CM | POA: Diagnosis not present

## 2022-05-06 LAB — BPAM RBC
Blood Product Expiration Date: 202308192359
ISSUE DATE / TIME: 202307260425
Unit Type and Rh: 6200

## 2022-05-06 LAB — TYPE AND SCREEN
ABO/RH(D): A POS
Antibody Screen: NEGATIVE
Unit division: 0

## 2022-05-06 MED ORDER — CHLORHEXIDINE GLUCONATE CLOTH 2 % EX PADS
6.0000 | MEDICATED_PAD | Freq: Every day | CUTANEOUS | Status: DC
  Administered 2022-05-06: 6 via TOPICAL

## 2022-05-06 MED ORDER — POLYETHYLENE GLYCOL 3350 17 G PO PACK: 17.0000 g | PACK | Freq: Every day | ORAL | 0 refills | Status: AC

## 2022-05-06 MED ORDER — LEVOTHYROXINE SODIUM 88 MCG PO TABS: 88.0000 ug | ORAL_TABLET | ORAL | Status: DC

## 2022-05-06 MED ORDER — LEVOTHYROXINE SODIUM 88 MCG PO TABS: 44.0000 ug | ORAL_TABLET | ORAL | Status: DC

## 2022-05-06 MED ORDER — POLYETHYLENE GLYCOL 3350 17 G PO PACK
17.0000 g | PACK | Freq: Every day | ORAL | Status: DC
  Administered 2022-05-06: 17 g via ORAL
  Filled 2022-05-06: qty 1

## 2022-05-06 NOTE — Progress Notes (Signed)
Patient discharged home. PIVs removed, discharge instructions given to daughters and patient. Patient discharged with foley catheter, chronic foley. All belongings sent home with patient.

## 2022-05-06 NOTE — TOC Progression Note (Signed)
Transition of Care [facility]) - Progression Note    Patient Details  Name: [name] MRN: [id number] Date of Birth: Apr 27, 1925  Transition of Care [facility]) CM/SW [place], RN Phone Number: 05/06/2022, 1:11 PM  Clinical Narrative:   RNCM spoke with patient's daughter.  Private caregivers in place, no issues with medications or tranportation.  Patient has no needs at this time.  TOC contact information provided in the event needs arise prior to discharge.         Expected Discharge Plan and Services           Expected Discharge Date: 05/06/22                                     Social Determinants of Health (SDOH) Interventions    Readmission Risk Interventions     No data to display

## 2022-05-06 NOTE — Progress Notes (Signed)
       CROSS COVER NOTE  NAME: [name] MRN: [id number] DOB : 07-04-1925    Date of Service   05/05/22  HPI/Events of Note   Secure chat received from nursing reporting bladder pain and discomfort. Patient has a chronic foley in place  Interventions   Plan: UA Pyridium       This document was prepared using Dragon voice recognition software and may include unintentional dictation errors.  Neomia Glass DNP, MHA, FNP-BC Nurse Practitioner [facility] Pager [phone]

## 2022-05-06 NOTE — Discharge Summary (Signed)
[name] IOX:[id number] DOB: [birth year] DOA: 05/04/2022  PCP: Olin Hauser, DO  Admit date: 05/04/2022 Discharge date: 05/06/2022  Admitted From: Home Disposition: Home  Recommendations for Outpatient Follow-up:  Follow up with PCP in 1 week Please obtain BMP/CBC in one week       Discharge Condition:Stable CODE STATUS: Full Diet recommendation: Heart Healthy  Brief/Interim Summary: Per QAS:[name] is a 88 y.o. female with medical history significant for HTN, anxiety, postsurgical hypothyroidism, chronic urinary retention with chronic indwelling Foley catheter and with remote history of ischemic colitis in 2005 who presents to the ED with rectal bleeding associated with abdominal cramping and fatigue.  EMS reports seeing a commode half filled with blood per ED providers note.  SBP with EMS was in the 70s. ED course and data review: BP 84/55 on arrival with pulse of 70, improving to 128/69 with IV fluid bolus Labs: Hemoglobin 7.7, down from 9.3 about 9 months prior, potassium 3.3, WBC 8.5 and lactic acid 1.1.  Patient was admitted to the hospital.  She received 1 unit of packed red blood cells.  GI was consulted and they recommended conservative management and to initiate MiraLAX.  She had no further hematochezia.  Tolerated her food.  H&H remained stable.  She is stable to be discharged.  Also during her hospitalization CT scan revealed pulmonary nodule and a left renal mass.  Had a extensive discussion with daughter, [name], who also spoke to her sister and they decided to do no further imaging /or w/u. Declined f/u with specialist. They are aware this possibly could be malignancy. If things changes , they will f/u with pcp for further evaluation then.   Hematochezia Acute blood loss anemia Acute colitis on CT History of ischemic colitis 2005 History of hemorrhoids CTA: No arterial or venous contrast extravasation is visible in the Bowel.Marland KitchenMarland KitchenAscending,  transverse and descending colitis versus nondistention. Hb 9.3-7.7-7.1 S/p 1 unit prbc GI consulted- conservative mx.  Recommend regular bowel regimen, start MiraLAX 17 g daily Given overall patient's medical condition, patient's daughter choose to continue supportive care and defer any endoscopy procedures at this time F/u with pcp   Laxative habit Chronic constipation Daughters say she has a daily laxative habit and gets upset when they try to ask her to cut back     Renal mass, left LLL nodule 2.7 cm enhancing solid left renal tumor, previously 2 cm, findings consistent with renal cell carcinoma until proven otherwise. No associated adenopathy. No renal vein tumor thrombus. Small scattered renal cysts. 5. New 1.5 x 0.9 cm nodule in the posterior basal right lower lobe, potentially metastasis given the finding of a left renal mass. Fleischner guidelines would ordinarily indicate prompt noncontrast chest CT for further evaluation for additional nodules. Further evaluation in this case however, should take into account advanced age and life expectancy as well as the likelihood of surgical intervention based on further evaluation.  -Daughters declined any further work-up or meeting with oncology/urology for the findings.  They or aware it could be a malignancy.  If they change their mind they will follow-up with patient's PCP for further evaluation   Chronic indwelling Foley catheter Last Foley catheter exchange was 6/27, changed every 4 weeks   Postsurgical hypothyroidism, hx of thyroid cancer Levothyroxine   Anxiety As needed Ativan. Continue home meds   Hypokalemia Repleted   Essential (primary) hypertension Hypotension Hold BP meds for now as BP is normal Follow-up with PCP for further reevaluation of her blood  pressure medications and when to resume        Discharge Diagnoses:  Principal Problem:   Hematochezia Active Problems:   Chronic constipation    Essential (primary) hypertension   Hypokalemia   ABLA (acute blood loss anemia)   Anxiety   Postsurgical hypothyroidism, hx of thyroid cancer   Chronic indwelling Foley catheter   Renal mass, left   Laxative habit    Discharge Instructions  Discharge Instructions     Diet - low sodium heart healthy   Complete by: As directed    Discharge instructions   Complete by: As directed    Hydrate during the day Hold blood pressure medication, if your blood pressure started rising can resume...>>but discuss with your pcp. F/u with pcp within 1 week   Increase activity slowly   Complete by: As directed       Allergies as of 05/06/2022       Reactions   Amoxicillin Itching, Swelling, Rash   Anaphylactoid reaction occurred around the time that the patient took both Amoxicillin and Cipro. ED notes state this reaction was to the Amoxicillin, patient's family reports it was to the Cipro.   Nitrofurantoin    swelling   Ceftin [cefuroxime]    Cephalosporins    Cortisone Other (See Comments)   Headache   Fluticasone Swelling   Facial   Levofloxacin Nausea And Vomiting   Meperidine Other (See Comments)   Morphine And Related Other (See Comments)   headache   Morphine Sulfate    Nisoldipine    Penicillin G Potassium In D5w    Prednisone Other (See Comments)   Headache   Sulfa Antibiotics Nausea Only   Cephalexin Swelling   Lip swelling with Keflex prescribed in January 2021. Family administered benadryl.   Ciprofloxacin Rash        Medication List     STOP taking these medications    amLODipine 5 MG tablet Commonly known as: NORVASC   carvedilol 6.25 MG tablet Commonly known as: COREG   furosemide 20 MG tablet Commonly known as: LASIX   Gemtesa 75 MG Tabs Generic drug: Vibegron       TAKE these medications    FLUoxetine 20 MG capsule Commonly known as: PROZAC Take 1 capsule (20 mg total) by mouth daily.   HYDROcodone-acetaminophen 10-325 MG  tablet Commonly known as: NORCO Take 1 tablet by mouth every 4 (four) hours as needed for moderate pain. six tablets a day every four hours --per pain management   LORazepam 0.5 MG tablet Commonly known as: ATIVAN Take 1 tablet (0.5 mg total) by mouth 4 (four) times daily as needed for anxiety.   mirtazapine 30 MG tablet Commonly known as: REMERON Take 1 tablet (30 mg total) by mouth at bedtime.   polyethylene glycol 17 g packet Commonly known as: MIRALAX / GLYCOLAX Take 17 g by mouth daily.   Synthroid 88 MCG tablet Generic drug: levothyroxine Take 1 tablet (88 mcg total) by mouth once daily Monday-Saturday.  Take 1/2 tab each week on Sunday.        Follow-up Information     Olin Hauser, DO Follow up in 1 week(s).   Specialty: Family Medicine Contact information: [place] [address] [phone]                Allergies  Allergen Reactions   Amoxicillin Itching, Swelling and Rash    Anaphylactoid reaction occurred around the time that the patient took both Amoxicillin  and Cipro. ED notes state this reaction was to the Amoxicillin, patient's family reports it was to the Cipro.   Nitrofurantoin     swelling   Ceftin [Cefuroxime]    Cephalosporins    Cortisone Other (See Comments)    Headache    Fluticasone Swelling    Facial   Levofloxacin Nausea And Vomiting   Meperidine Other (See Comments)   Morphine And Related Other (See Comments)    headache   Morphine Sulfate    Nisoldipine    Penicillin G Potassium In D5w    Prednisone Other (See Comments)    Headache    Sulfa Antibiotics Nausea Only   Cephalexin Swelling    Lip swelling with Keflex prescribed in January 2021. Family administered benadryl.   Ciprofloxacin Rash    Consultations: GI   Procedures/Studies: CT ANGIO GI BLEED  Result Date: 05/05/2022 CLINICAL DATA:  GI bleeding.  88 year old female. EXAM: CTA ABDOMEN AND PELVIS WITHOUT AND WITH CONTRAST TECHNIQUE:  Multidetector CT imaging of the abdomen and pelvis was performed using the standard protocol during bolus administration of intravenous contrast. Multiplanar reconstructed images and MIPs were obtained and reviewed to evaluate the vascular anatomy. RADIATION DOSE REDUCTION: This exam was performed according to the departmental dose-optimization program which includes automated exposure control, adjustment of the mA and/or kV according to patient size and/or use of iterative reconstruction technique. CONTRAST:  78m OMNIPAQUE IOHEXOL 350 MG/ML SOLN COMPARISON:  CT abdomen and pelvis with IV contrast 08/22/2018. FINDINGS: VASCULAR Aorta: The abdominal aorta is tortuous with minimal scattered atherosclerotic calcifications. There is no stenosis, dissection or aneurysm. Celiac: Patent without evidence of aneurysm, dissection, vasculitis or significant stenosis. There is nonstenosing ostial calcification. SMA: Patent without evidence of aneurysm, dissection, vasculitis or significant stenosis. Renals: Both renal arteries are single. There are bilateral nonstenosing ostial calcifications. There is no renal artery stenosis, dissection or aneurysm. IMA: Patent without evidence of aneurysm, dissection, vasculitis or significant stenosis. Inflow: Both common iliac arteries are tortuous. There is tortuosity and scattered calcification in the internal iliac arteries. There is no flow-limiting stenosis, dissection or aneurysm in the bilateral inflow vessels. Both external iliac arteries are clear. Proximal Outflow: Bilateral common femoral and visualized portions of the superficial and profunda femoral arteries are patent without evidence of aneurysm, dissection, vasculitis or significant stenosis. There is nonstenosing mixed plaque in both common femoral arteries Veins: Unremarkable. Review of the MIP images confirms the above findings. NON-VASCULAR Technical note: There is abundant streak artifact in the abdomen and pelvis  limiting evaluation of the soft tissues. This arises from L3-4 bilateral posterior spinal fusion hardware, and from the patient's arms overlying the field. Lower chest: Lung bases show atelectasis and scarring changes. Moderate to severe panchamber cardiomegaly with slightly increased, prominent 3.4 cm pulmonary trunk indicating arterial hypertension and minimal calcification coronary arteries. There is new demonstration of a pleural-based ovoid 1.5 x 0.8 cm right lower lobe noncalcified nodule. Per Fleischner Society Guidelines, recommend prompt non-contrast Chest CT for further evaluation, but please see below comments. These guidelines do not apply to immunocompromised patients and patients with cancer. Follow up in patients with significant comorbidities as clinically warranted. For lung cancer screening, adhere to Lung-RADS guidelines. Reference: Radiology. 2017; 284(1):228-43. Hepatobiliary: No visible focal hepatic abnormality allowing for artifacts. Gallbladder and bile ducts are unremarkable. Pancreas: Not well seen but there is no obvious mass or ductal dilatation. Spleen: Normal in size with homogeneous enhancement. Adrenals/Urinary Tract: There is no adrenal mass. There are small scattered  renal cysts. Posteriorly in the upper pole left kidney there is an enhancing, slightly heterogeneous solid tumor of the cortex measuring 2.7 cm consistent with a renal cell carcinoma until proven otherwise. Previously this measured 2.0 cm. There is no urinary stone or obstruction and no renal vein tumor thrombus. There is air in the bladder and mild diffuse thickening of the bladder. The bladder is catheterized. It is not fully decompressed which may indicate catheter dysfunction. Stomach/Bowel: No contrast extravasation is seen in the stomach and bowel. The gastric wall is unremarkable. Unopacified small bowel demonstrate no dilatation. There is no pneumatosis. An appendix is not seen in this patient. There is mild  thickening or underdistention in the ascending and transverse colon and proximal descending colon. The rectosigmoid segment, as visualized is unremarkable. Lymphatic: There is no appreciable adenopathy in the abdomen or pelvis. Reproductive: Status post hysterectomy. No adnexal masses. There are numerous pelvic phleboliths. Other: There is mild diffuse edema in the body wall and mesentery which is probably congestive, not seen in 2019, or could be due to fluid overload. There is minimal ascites in the posterior deep pelvis. There is no free air, hemorrhage or abscess. Musculoskeletal: There is osteopenia with degenerative changes of the thoracic and lumbar spine, chronic L3-5 bilateral posterior fusion hardware, and interval new finding moderate generalized L5 vertebral height loss with marrow heterogeneity which could be due to healing response from a recent compression fracture or due to metastatic involvement of the segment. There is minimal posterior cortical retropulsion. I do not see further evidence of focal marrow lesions but CT is insensitive for this. There is chronic partial height loss along the lower plate of L3 which is unchanged. There is a mildly displaced oblique fracture line through the posterior column of the L5 body. IMPRESSION: VASCULAR 1. Mild aortoiliac atherosclerosis with nonstenosing calcifications in the visceral arteries. No flow-limiting visceral arterial, inflow or proximal outflow vessel stenosis is seen. 2. No arterial or venous contrast extravasation is visible in the bowel. 3. Moderate to severe cardiomegaly, slightly increased from 2019. 4. Prominence of the pulmonary trunk indicating arterial hypertension. Trace CAD. NON-VASCULAR 1. Body wall and mesenteric edema likely congestive or from fluid overload. Minimal ascites. 2. Ascending, transverse and descending colitis versus nondistention. 3. Bladder catheterized with findings of cystitis or nondistention. Correlate clinically  for catheter dysfunction as the bladder is not fully decompressed. 4. 2.7 cm enhancing solid left renal tumor, previously 2 cm, findings consistent with renal cell carcinoma until proven otherwise. No associated adenopathy. No renal vein tumor thrombus. Small scattered renal cysts. 5. New 1.5 x 0.9 cm nodule in the posterior basal right lower lobe, potentially metastasis given the finding of a left renal mass. Fleischner guidelines would ordinarily indicate prompt noncontrast chest CT for further evaluation for additional nodules. Further evaluation in this case however, should take into account advanced age and life expectancy as well as the likelihood of surgical intervention based on further evaluation. 6. There is a moderate L5 compression fracture with slight retropulsion, and marrow heterogeneity which could indicate healing response from recent injury or could be due to metastatic involvement of this vertebral body resulting in a pathologic fracture. If clinically warranted, MRI may be helpful. 7. Osteopenia with degenerative and postsurgical changes without other evidence of regional bony metastasis. 8. Remaining findings discussed above. Electronically Signed   By: Telford Nab M.D.   On: 05/05/2022 00:16   CT SOFT TISSUE NECK W CONTRAST  Result Date: 04/16/2022 CLINICAL DATA:  Atypical face pain on the left side. History of thyroid cancer surgery. EXAM: CT NECK WITH CONTRAST TECHNIQUE: Multidetector CT imaging of the neck was performed using the standard protocol following the bolus administration of intravenous contrast. RADIATION DOSE REDUCTION: This exam was performed according to the departmental dose-optimization program which includes automated exposure control, adjustment of the mA and/or kV according to patient size and/or use of iterative reconstruction technique. CONTRAST:  12m ISOVUE-300 IOPAMIDOL (ISOVUE-300) INJECTION 61% COMPARISON:  Report from outside neck CT 06/25/2020 FINDINGS:  Pharynx and larynx: Low-density mass at the left vallecula attributed to mucous retention cyst. No masslike finding or acute inflammation Salivary glands: Symmetric atrophy. No evidence of acute inflammation or mass Thyroid: Surgically absent. No nodularity seen in the thyroidectomy bed. Lymph nodes: Nodule, presumed metastatic node, along the left jugular chain measuring 17 x 12 mm on 3:55. the nodule is closely associated with the posterior aspect of the common carotid. Vascular: History of left IJ tumor thrombus per prior report. The left IJ appears to have been resected. Extensive atheromatous plaque. Carotid tortuosity. Limited intracranial: Negative Visualized orbits: Partial coverage is negative Mastoids and visualized paranasal sinuses: Clear Skeleton: Generalized cervical spine degeneration and osteopenia. No acute finding or destructive process. Upper chest: Clear apical lungs. IMPRESSION: 17 x 12 mm nodule in the left lateral neck closely associated to the posterior common carotid, likely residual/recurrent thyroid carcinoma. Electronically Signed   By: JJorje GuildM.D.   On: 04/16/2022 13:45      Subjective: Has no further blood.  No complaints  Discharge Exam: Vitals:   05/06/22 0003 05/06/22 0737  BP: 112/72 119/75  Pulse: 64 71  Resp: 17 18  Temp: 98 F (36.7 C) 98.5 F (36.9 C)  SpO2: (!) 87% 91%   Vitals:   05/05/22 1533 05/05/22 2003 05/06/22 0003 05/06/22 0737  BP: 129/81 109/72 112/72 119/75  Pulse: 79 80 64 71  Resp: '15 16 17 18  '$ Temp: 98.9 F (37.2 C) 98.5 F (36.9 C) 98 F (36.7 C) 98.5 F (36.9 C)  TempSrc:      SpO2: 95% 94% (!) 87% 91%  Weight:      Height:        General: Pt is alert, awake, not in acute distress Cardiovascular: RRR, S1/S2 +, no rubs, no gallops Respiratory: CTA bilaterally, no wheezing, no rhonchi Abdominal: Soft, NT, ND, bowel sounds + Extremities: no edema, no cyanosis    The results of significant diagnostics from this  hospitalization (including imaging, microbiology, ancillary and laboratory) are listed below for reference.     Microbiology: No results found for this or any previous visit (from the past 240 hour(s)).   Labs: BNP (last 3 results) No results for input(s): "BNP" in the last 8760 hours. Basic Metabolic Panel: Recent Labs  Lab 05/04/22 2209  NA 135  K 3.3*  CL 105  CO2 24  GLUCOSE 165*  BUN 15  CREATININE 0.83  CALCIUM 7.5*   Liver Function Tests: Recent Labs  Lab 05/04/22 2209  AST 23  ALT 10  ALKPHOS 56  BILITOT 1.0  PROT 5.9*  ALBUMIN 3.1*   Recent Labs  Lab 05/04/22 2209  LIPASE 32   No results for input(s): "AMMONIA" in the last 168 hours. CBC: Recent Labs  Lab 05/04/22 2209 05/05/22 0231 05/05/22 2001  WBC 8.5  --   --   NEUTROABS 6.4  --   --   HGB 7.7* 7.1* 9.6*  HCT 26.0* 22.7* 29.9*  MCV 87.0  --   --   PLT 168  --   --    Cardiac Enzymes: No results for input(s): "CKTOTAL", "CKMB", "CKMBINDEX", "TROPONINI" in the last 168 hours. BNP: Invalid input(s): "POCBNP" CBG: No results for input(s): "GLUCAP" in the last 168 hours. D-Dimer No results for input(s): "DDIMER" in the last 72 hours. Hgb A1c No results for input(s): "HGBA1C" in the last 72 hours. Lipid Profile No results for input(s): "CHOL", "HDL", "LDLCALC", "TRIG", "CHOLHDL", "LDLDIRECT" in the last 72 hours. Thyroid function studies No results for input(s): "TSH", "T4TOTAL", "T3FREE", "THYROIDAB" in the last 72 hours.  Invalid input(s): "FREET3" Anemia work up Recent Labs    05/04/22 2209  VITAMINB12 363  FOLATE 15.2  FERRITIN 9*  TIBC 382  IRON 35   Urinalysis    Component Value Date/Time   COLORURINE STRAW (A) 05/05/2022 2316   APPEARANCEUR CLEAR (A) 05/05/2022 2316   APPEARANCEUR Cloudy (A) 08/06/2021 1605   LABSPEC 1.005 05/05/2022 2316   LABSPEC 1.004 08/05/2014 0115   PHURINE 8.0 05/05/2022 2316   GLUCOSEU NEGATIVE 05/05/2022 2316   GLUCOSEU Negative 08/05/2014  0115   HGBUR SMALL (A) 05/05/2022 2316   BILIRUBINUR NEGATIVE 05/05/2022 2316   BILIRUBINUR Negative 08/06/2021 1605   BILIRUBINUR Negative 08/05/2014 0115   KETONESUR NEGATIVE 05/05/2022 2316   PROTEINUR NEGATIVE 05/05/2022 2316   UROBILINOGEN 0.2 06/26/2019 1322   UROBILINOGEN 0.2 03/07/2008 1048   NITRITE NEGATIVE 05/05/2022 2316   LEUKOCYTESUR SMALL (A) 05/05/2022 2316   LEUKOCYTESUR Negative 08/05/2014 0115   Sepsis Labs Recent Labs  Lab 05/04/22 2209  WBC 8.5   Microbiology No results found for this or any previous visit (from the past 240 hour(s)).   Time coordinating discharge: Over 30 minutes  SIGNED:   Nolberto Hanlon, MD  [facility] 05/06/2022, 11:13 AM Pager   If 7PM-7AM, please contact night-coverage [email or web address] Password [email or web address]

## 2022-05-07 ENCOUNTER — Telehealth: Payer: Self-pay

## 2022-05-07 ENCOUNTER — Other Ambulatory Visit: Payer: Self-pay

## 2022-05-07 NOTE — Telephone Encounter (Signed)
Transition Care Management Unsuccessful Follow-up Telephone Call  Date of discharge and from where:  [facility] 05/06/22   Attempts:  2nd Attempt  Reason for unsuccessful TCM follow-up call:  Left voice message

## 2022-05-07 NOTE — Patient Outreach (Signed)
  Care Coordination [facility] Note Transition Care Management Unsuccessful Follow-up Telephone Call  Date of discharge and from where:  05/06/22 [facility]  Attempts:  1st Attempt  Reason for unsuccessful TCM follow-up call:  Unable to leave message Peter Garter RN, [facility], CDE Care Management Coordinator [place] [facility] [phone]

## 2022-05-08 ENCOUNTER — Other Ambulatory Visit: Payer: Self-pay | Admitting: Family Medicine

## 2022-05-08 DIAGNOSIS — Z515 Encounter for palliative care: Secondary | ICD-10-CM

## 2022-05-08 DIAGNOSIS — F411 Generalized anxiety disorder: Secondary | ICD-10-CM

## 2022-05-10 ENCOUNTER — Other Ambulatory Visit: Payer: Self-pay | Admitting: *Deleted

## 2022-05-10 NOTE — Patient Outreach (Signed)
  Care Coordination [facility] Note Transition Care Management Unsuccessful Follow-up Telephone Call  Date of discharge and from where:  05/06/22 [facility]  Attempts:  3rd Attempt  Reason for unsuccessful TCM follow-up call:  Unable to leave message. Patient's VM has not been set up.  Emelia Loron RN, BSN [place] [phone] [email or web address]

## 2022-05-10 NOTE — Telephone Encounter (Signed)
Requested medication (s) are due for refill today:yes  Requested medication (s) are on the active medication list: yes  Last refill:  02/04/22  Future visit scheduled: yes  Notes to clinic:  Unable to refill per protocol, cannot delegate lorazepam.       Requested Prescriptions  Pending Prescriptions Disp Refills   LORazepam (ATIVAN) 0.5 MG tablet [Pharmacy Med Name: LORAZEPAM 0.5 MG TABLET] 120 tablet 0    Sig: Take 1 tablet (0.5 mg total) by mouth 4 (four) times daily as needed for anxiety.     Not Delegated - Psychiatry: Anxiolytics/Hypnotics 2 Failed - 05/08/2022 10:47 AM      Failed - This refill cannot be delegated      Failed - Urine Drug Screen completed in last 360 days      Passed - Patient is not pregnant      Passed - Valid encounter within last 6 months    Recent Outpatient Visits           4 months ago Left ear impacted cerumen   [facility] Olin Hauser, DO   1 year ago COVID-19 virus infection   [place], DO   1 year ago Generalized anxiety disorder   [place], DO   2 years ago Acute left otitis media   [place], DO   2 years ago Acute cystitis without hematuria   [facility] [name], [place]       Future Appointments             In 1 week Debroah Loop, [place]   In 1 month  [facility]

## 2022-05-11 ENCOUNTER — Telehealth: Payer: Self-pay

## 2022-05-11 NOTE — Telephone Encounter (Signed)
227 pm.  Message received daughter on 05/10/22 describing patient's recent hospitalization and need for additional services.  Return call made today to Eldridge Dace.  No answer to cell phone and unable to LVM.  Phone call made to work number and message left.

## 2022-05-17 ENCOUNTER — Other Ambulatory Visit: Payer: Self-pay | Admitting: Family Medicine

## 2022-05-17 ENCOUNTER — Ambulatory Visit: Payer: Medicare Other | Admitting: Physician Assistant

## 2022-05-17 ENCOUNTER — Other Ambulatory Visit: Payer: Medicare Other | Admitting: Primary Care

## 2022-05-17 DIAGNOSIS — Z515 Encounter for palliative care: Secondary | ICD-10-CM

## 2022-05-17 DIAGNOSIS — C73 Malignant neoplasm of thyroid gland: Secondary | ICD-10-CM

## 2022-05-17 DIAGNOSIS — R634 Abnormal weight loss: Secondary | ICD-10-CM

## 2022-05-17 DIAGNOSIS — E440 Moderate protein-calorie malnutrition: Secondary | ICD-10-CM

## 2022-05-17 DIAGNOSIS — Z978 Presence of other specified devices: Secondary | ICD-10-CM

## 2022-05-17 DIAGNOSIS — E042 Nontoxic multinodular goiter: Secondary | ICD-10-CM

## 2022-05-17 DIAGNOSIS — F419 Anxiety disorder, unspecified: Secondary | ICD-10-CM

## 2022-05-17 DIAGNOSIS — I10 Essential (primary) hypertension: Secondary | ICD-10-CM

## 2022-05-17 DIAGNOSIS — N2889 Other specified disorders of kidney and ureter: Secondary | ICD-10-CM

## 2022-05-17 DIAGNOSIS — E890 Postprocedural hypothyroidism: Secondary | ICD-10-CM

## 2022-05-17 NOTE — Progress Notes (Signed)
[facility] Palliative Care Consult Note Telephone: [phone]  Fax: [phone]   Date of encounter: 05/17/22 12:37 PM PATIENT NAME: [name] [place] [id number]   [phone] (home)  DOB: Aug 04, 1925 MRN: [id number] PRIMARY CARE PROVIDER:    Olin Hauser, DO,  [place] [address] [phone]  REFERRING PROVIDER:   Olin Hauser, DO [address] [place],  [place] [address] [phone]  RESPONSIBLE PARTY:    Contact Information     Name Relation Home Work Mobile   [name] Daughter [phone]   [name] Daughter [phone]       I met face to face with patient and family in  home. Palliative Care was asked to follow this patient by consultation request of  [name] * to address advance care planning and complex medical decision making. This is the initial visit.                                     ASSESSMENT AND PLAN / RECOMMENDATIONS:   Advance Care Planning/Goals of Care: Goals include to maximize quality of life and symptom management. Patient/health care surrogate gave his/her permission to discuss.Our advance care planning conversation included a discussion about:    The value and importance of advance care planning  Experiences with loved ones who have been seriously ill or have died - their father died suddenly in his 81's. Exploration of personal, cultural or spiritual beliefs that might influence medical decisions  Exploration of goals of care in the event of a sudden injury or illness  Identification of a healthcare agent - both Daughters Review and  creation of an  advance directive document - Left MOST and DNR in home Decision not to resuscitate and  to de-escalate disease focused treatments due to poor prognosis. Recent hospital stay for gi bleed and transfusion. Pt has stated she does not  want to return to hospital Pt qualifies for hospice admission and would support her goals of being cared for in place and not returning to hospital CODE STATUS: DNR  I completed a MOST form today. The patient and family outlined their wishes for the following treatment decisions:  Cardiopulmonary Resuscitation: Do Not Attempt Resuscitation (DNR/No CPR)  Medical Interventions: Comfort Measures: Keep clean, warm, and dry. Use medication by any route, positioning, wound care, and other measures to relieve pain and suffering. Use oxygen, suction and manual treatment of airway obstruction as needed for comfort. Do not transfer to the hospital unless comfort needs cannot be met in current location.  Antibiotics: Antibiotics if indicated  IV Fluids: IV fluids for a defined trial period  Feeding Tube: No feeding tube    I spent 50 minutes providing this consultation. More than 50% of the time in this consultation was spent in counseling and care coordination. --------------------------------------------------------------------------------------------------  Symptom Management/Plan:  Nutrition:  Weight loss of 10% in past 6 months, 26% in past year. Currently 92 lbs, was 124 lbs a year ago. She reports early satiety.  She is eating 25-50% of meals. Recent albumin 3.1.  Pain Management: Has escalating pain, has norco 5/325 mg q 4 hrs currently, and would benefit from gabapentin.  Would also benefit from increased opioid therapy here at end of life. Is seen by pain management. Hospice to follow for comfort measures.  Anxiety Disorder: Taking ativan qid, patient  is agitated with changes. She may benefit from increase in Prozac. Hospice could also send in supportive personnel eg chaplain, SW to assist with EOL emotional support.   Caregiver strain: Daughters endorse ongoing strain from her pain and decreased mobility. She is calling in the night to report poor pain control.  Goals of care: Pt has stated  she wants to stay at home. We discussed futility of care at her age and with her debility and disease process. Daughters concur this is the best way to manage her care needs, to de escalate care, and would like the supportive services of hospice to assist.   Constipation: Pt has history of laxatives and lomotil. Will refer to hospice RN to establish bowel program. Currently on miralax.  Follow up Palliative Care Visit: Refer to hospice  This visit was coded based on medical decision making (MDM).  PPS: 40%  HOSPICE ELIGIBILITY/DIAGNOSIS: yes/abnormal weight loss  Chief Complaint: weight loss, debility, chronic pain  HISTORY OF PRESENT ILLNESS:  [name] is a 87 y.o. year old female  with spinal stenosis, chronic pain, new nodules presumed metastatic from primary Thyroid cancer, abnormal weight loss. Pain is escalating. Pt has lost 25% weight in a year . Patient seen today to review palliative care needs to include medical decision making and advance care planning as appropriate.   History obtained from review of EMR, discussion with primary team, and interview with family, facility staff/caregiver and/or [name].  I reviewed available labs, medications, imaging, studies and related documents from the EMR.  Records reviewed and summarized above.   ROS  General: NAD ENMT: denies dysphagia Cardiovascular: denies chest pain, denies DOE Pulmonary: denies cough, denies increased SOB Abdomen: endorses fair  appetite, endorses  constipation, endorses continence of bowel GU: denies dysuria, endorses continence of urine MSK:  endorses  increased weakness,  no falls reported Skin: denies rashes or wounds Neurological: endorses worsening  pain, denies insomnia Psych: Endorses anxious mood Heme/lymph/immuno: denies bruises, abnormal bleeding  Physical Exam: Current and past weights:92 lbs Constitutional: NAD General: frail appearing, thin EYES: anicteric sclera, lids intact, no  discharge  ENMT: intact hearing, oral mucous membranes moist, dentition  missing, Lower edentulous CV: S1S2, RRR, no LE edema Pulmonary: LCTA, no increased work of breathing, no cough, room air Abdomen: intake <50%, soft and non tender, no ascites GU: deferred MSK: + sarcopenia, moves all extremities, ambulatory with help Skin: warm and dry, no rashes or wounds on visible skin Neuro:  no generalized weakness,  no cognitive impairment Psych: anxious affect, A and O x 2 Hem/lymph/immuno: no widespread bruising  CURRENT PROBLEM LIST:  Patient Active Problem List   Diagnosis Date Noted   Hypokalemia 05/05/2022   ABLA (acute blood loss anemia) 05/05/2022   Anxiety 05/05/2022   Postsurgical hypothyroidism, hx of thyroid cancer 05/05/2022   Chronic indwelling Foley catheter 05/05/2022   Hematochezia 05/05/2022   Renal mass, left 05/05/2022   Laxative habit 05/05/2022   Multinodular goiter 03/18/2020   Papillary thyroid carcinoma ([place]) 03/18/2020   Macular degeneration 09/04/2018   Cervical lymphadenopathy 07/18/2018   Pain in right hip 07/18/2018   Lymphadenopathy 06/25/2018   Stomatitis and mucositis 06/25/2018   Shortness of breath 02/16/2018   Arm fracture, left 01/03/2018   Arthritis 01/03/2018   Compression fracture of thoracic vertebra ([place]) 01/03/2018   Emphysema lung ([place]) 01/03/2018   GERD (gastroesophageal reflux disease) 01/03/2018   GI bleeding 01/03/2018   Lumbar spondylosis 01/03/2018   Menstrual problem 01/03/2018  Osteoarthritis of knee 01/03/2018   Osteoporosis 01/03/2018   Acute pain of right knee 01/03/2018   Synovial cyst of popliteal space 01/03/2018   History of urinary retention 11/10/2017   Generalized anxiety disorder 10/24/2017   Chronic constipation 10/24/2017   Internal hemorrhoids 10/24/2017   Essential (primary) hypertension 10/24/2017   Thrombocytopenia ([place]) 10/24/2017   Acute encephalopathy 08/04/2017   Hyponatremia 12/15/2015   PAST  MEDICAL HISTORY:  Active Ambulatory Problems    Diagnosis Date Noted   Hyponatremia 12/15/2015   Acute encephalopathy 08/04/2017   Generalized anxiety disorder 10/24/2017   Chronic constipation 10/24/2017   Internal hemorrhoids 10/24/2017   Essential (primary) hypertension 10/24/2017   Thrombocytopenia ([place]) 10/24/2017   History of urinary retention 11/10/2017   Arm fracture, left 01/03/2018   Arthritis 01/03/2018   Compression fracture of thoracic vertebra ([place]) 01/03/2018   Emphysema lung ([place]) 01/03/2018   GERD (gastroesophageal reflux disease) 01/03/2018   GI bleeding 01/03/2018   Lumbar spondylosis 01/03/2018   Menstrual problem 01/03/2018   Osteoarthritis of knee 01/03/2018   Osteoporosis 01/03/2018   Acute pain of right knee 01/03/2018   Synovial cyst of popliteal space 01/03/2018   Shortness of breath 02/16/2018   Lymphadenopathy 06/25/2018   Stomatitis and mucositis 06/25/2018   Cervical lymphadenopathy 07/18/2018   Pain in right hip 07/18/2018   Macular degeneration 09/04/2018   Multinodular goiter 03/18/2020   Papillary thyroid carcinoma ([place]) 03/18/2020   Hypokalemia 05/05/2022   ABLA (acute blood loss anemia) 05/05/2022   Anxiety 05/05/2022   Postsurgical hypothyroidism, hx of thyroid cancer 05/05/2022   Chronic indwelling Foley catheter 05/05/2022   Hematochezia 05/05/2022   Renal mass, left 05/05/2022   Laxative habit 05/05/2022   Resolved Ambulatory Problems    Diagnosis Date Noted   Ankle sprain 01/03/2018   Chest pain 02/16/2018   Other fatigue 02/16/2018   Acute recurrent sinusitis 06/25/2018   Pressure injury of skin 07/29/2018   Syncope 08/23/2018   Past Medical History:  Diagnosis Date   Allergy    Chronic back pain    High cholesterol    Hypertension    Migraines    Pneumonia    Sinusitis    SOCIAL HX:  Social History   Tobacco Use   Smoking status: Never    Passive exposure: Never   Smokeless tobacco: Never  Substance Use  Topics   Alcohol use: No   FAMILY HX:  Family History  Problem Relation Age of Onset   Breast cancer Sister 107   Breast cancer Maternal Grandmother 66   Breast cancer Sister 16   Bladder Cancer Brother    Hypertension Neg Hx    Kidney cancer Neg Hx       ALLERGIES:  Allergies  Allergen Reactions   Amoxicillin Itching, Swelling and Rash    Anaphylactoid reaction occurred around the time that the patient took both Amoxicillin and Cipro. ED notes state this reaction was to the Amoxicillin, patient's family reports it was to the Cipro.   Nitrofurantoin     swelling   Ceftin [Cefuroxime]    Cephalosporins    Cortisone Other (See Comments)    Headache    Fluticasone Swelling    Facial   Levofloxacin Nausea And Vomiting   Meperidine Other (See Comments)   Morphine And Related Other (See Comments)    headache   Morphine Sulfate    Nisoldipine    Penicillin G Potassium In D5w    Prednisone Other (See Comments)    Headache  Sulfa Antibiotics Nausea Only   Cephalexin Swelling    Lip swelling with Keflex prescribed in January 2021. Family administered benadryl.   Ciprofloxacin Rash     PERTINENT MEDICATIONS:  Outpatient Encounter Medications as of 05/17/2022  Medication Sig   carvedilol (COREG) 6.25 MG tablet Take 6.25 mg by mouth 2 (two) times daily with a meal.   FLUoxetine (PROZAC) 20 MG capsule Take 1 capsule (20 mg total) by mouth daily.   furosemide (LASIX) 20 MG tablet Take 20 mg by mouth daily.   HYDROcodone-acetaminophen (NORCO) 10-325 MG tablet Take 1 tablet by mouth every 4 (four) hours as needed for moderate pain. six tablets a day every four hours --per pain management   levothyroxine (SYNTHROID) 88 MCG tablet Take 1 tablet (88 mcg total) by mouth once daily Monday-Saturday.  Take 1/2 tab each week on Sunday.   LORazepam (ATIVAN) 0.5 MG tablet Take 1 tablet (0.5 mg total) by mouth 4 (four) times daily as needed for anxiety.   mirtazapine (REMERON) 30 MG tablet  Take 1 tablet (30 mg total) by mouth at bedtime.   polyethylene glycol (MIRALAX / GLYCOLAX) 17 g packet Take 17 g by mouth daily.   No facility-administered encounter medications on file as of 05/17/2022.     Thank you for the opportunity to participate in the care of [name].  The palliative care team will continue to follow. Please call our office at [phone] if we can be of additional assistance.   Jason Coop, NP , DNP, AGPCNP-BC  COVID-19 PATIENT SCREENING TOOL Asked and negative response unless otherwise noted:  Have you had symptoms of covid, tested positive or been in contact with someone with symptoms/positive test in the past 5-10 days?

## 2022-05-19 DIAGNOSIS — I110 Hypertensive heart disease with heart failure: Secondary | ICD-10-CM | POA: Diagnosis not present

## 2022-05-19 DIAGNOSIS — F32A Depression, unspecified: Secondary | ICD-10-CM | POA: Diagnosis not present

## 2022-05-19 DIAGNOSIS — I5030 Unspecified diastolic (congestive) heart failure: Secondary | ICD-10-CM | POA: Diagnosis not present

## 2022-05-19 DIAGNOSIS — M4800 Spinal stenosis, site unspecified: Secondary | ICD-10-CM | POA: Diagnosis not present

## 2022-05-19 DIAGNOSIS — K922 Gastrointestinal hemorrhage, unspecified: Secondary | ICD-10-CM | POA: Diagnosis not present

## 2022-05-19 DIAGNOSIS — H3530 Unspecified macular degeneration: Secondary | ICD-10-CM | POA: Diagnosis not present

## 2022-05-19 DIAGNOSIS — C73 Malignant neoplasm of thyroid gland: Secondary | ICD-10-CM | POA: Diagnosis not present

## 2022-05-19 DIAGNOSIS — F419 Anxiety disorder, unspecified: Secondary | ICD-10-CM | POA: Diagnosis not present

## 2022-05-19 DIAGNOSIS — C7802 Secondary malignant neoplasm of left lung: Secondary | ICD-10-CM | POA: Diagnosis not present

## 2022-05-19 DIAGNOSIS — M810 Age-related osteoporosis without current pathological fracture: Secondary | ICD-10-CM | POA: Diagnosis not present

## 2022-05-19 DIAGNOSIS — E43 Unspecified severe protein-calorie malnutrition: Secondary | ICD-10-CM | POA: Diagnosis not present

## 2022-05-19 DIAGNOSIS — J449 Chronic obstructive pulmonary disease, unspecified: Secondary | ICD-10-CM | POA: Diagnosis not present

## 2022-05-19 DIAGNOSIS — C7902 Secondary malignant neoplasm of left kidney and renal pelvis: Secondary | ICD-10-CM | POA: Diagnosis not present

## 2022-05-19 DIAGNOSIS — D500 Iron deficiency anemia secondary to blood loss (chronic): Secondary | ICD-10-CM | POA: Diagnosis not present

## 2022-05-19 DIAGNOSIS — Z466 Encounter for fitting and adjustment of urinary device: Secondary | ICD-10-CM | POA: Diagnosis not present

## 2022-05-19 DIAGNOSIS — J310 Chronic rhinitis: Secondary | ICD-10-CM | POA: Diagnosis not present

## 2022-05-19 DIAGNOSIS — R634 Abnormal weight loss: Secondary | ICD-10-CM | POA: Diagnosis not present

## 2022-05-19 DIAGNOSIS — G894 Chronic pain syndrome: Secondary | ICD-10-CM | POA: Diagnosis not present

## 2022-05-19 DIAGNOSIS — E890 Postprocedural hypothyroidism: Secondary | ICD-10-CM | POA: Diagnosis not present

## 2022-05-19 DIAGNOSIS — K219 Gastro-esophageal reflux disease without esophagitis: Secondary | ICD-10-CM | POA: Diagnosis not present

## 2022-05-19 DIAGNOSIS — E785 Hyperlipidemia, unspecified: Secondary | ICD-10-CM | POA: Diagnosis not present

## 2022-05-19 DIAGNOSIS — Z681 Body mass index (BMI) 19 or less, adult: Secondary | ICD-10-CM | POA: Diagnosis not present

## 2022-05-19 DIAGNOSIS — R339 Retention of urine, unspecified: Secondary | ICD-10-CM | POA: Diagnosis not present

## 2022-05-20 DIAGNOSIS — E43 Unspecified severe protein-calorie malnutrition: Secondary | ICD-10-CM | POA: Diagnosis not present

## 2022-05-20 DIAGNOSIS — R634 Abnormal weight loss: Secondary | ICD-10-CM | POA: Diagnosis not present

## 2022-05-20 DIAGNOSIS — E890 Postprocedural hypothyroidism: Secondary | ICD-10-CM | POA: Diagnosis not present

## 2022-05-20 DIAGNOSIS — C7902 Secondary malignant neoplasm of left kidney and renal pelvis: Secondary | ICD-10-CM | POA: Diagnosis not present

## 2022-05-20 DIAGNOSIS — C7802 Secondary malignant neoplasm of left lung: Secondary | ICD-10-CM | POA: Diagnosis not present

## 2022-05-20 DIAGNOSIS — C73 Malignant neoplasm of thyroid gland: Secondary | ICD-10-CM | POA: Diagnosis not present

## 2022-05-21 DIAGNOSIS — C7802 Secondary malignant neoplasm of left lung: Secondary | ICD-10-CM | POA: Diagnosis not present

## 2022-05-21 DIAGNOSIS — E43 Unspecified severe protein-calorie malnutrition: Secondary | ICD-10-CM | POA: Diagnosis not present

## 2022-05-21 DIAGNOSIS — E890 Postprocedural hypothyroidism: Secondary | ICD-10-CM | POA: Diagnosis not present

## 2022-05-21 DIAGNOSIS — C73 Malignant neoplasm of thyroid gland: Secondary | ICD-10-CM | POA: Diagnosis not present

## 2022-05-21 DIAGNOSIS — C7902 Secondary malignant neoplasm of left kidney and renal pelvis: Secondary | ICD-10-CM | POA: Diagnosis not present

## 2022-05-21 DIAGNOSIS — R634 Abnormal weight loss: Secondary | ICD-10-CM | POA: Diagnosis not present

## 2022-05-23 DIAGNOSIS — E890 Postprocedural hypothyroidism: Secondary | ICD-10-CM | POA: Diagnosis not present

## 2022-05-23 DIAGNOSIS — C7902 Secondary malignant neoplasm of left kidney and renal pelvis: Secondary | ICD-10-CM | POA: Diagnosis not present

## 2022-05-23 DIAGNOSIS — E43 Unspecified severe protein-calorie malnutrition: Secondary | ICD-10-CM | POA: Diagnosis not present

## 2022-05-23 DIAGNOSIS — C7802 Secondary malignant neoplasm of left lung: Secondary | ICD-10-CM | POA: Diagnosis not present

## 2022-05-23 DIAGNOSIS — R634 Abnormal weight loss: Secondary | ICD-10-CM | POA: Diagnosis not present

## 2022-05-23 DIAGNOSIS — C73 Malignant neoplasm of thyroid gland: Secondary | ICD-10-CM | POA: Diagnosis not present

## 2022-05-24 DIAGNOSIS — E43 Unspecified severe protein-calorie malnutrition: Secondary | ICD-10-CM | POA: Diagnosis not present

## 2022-05-24 DIAGNOSIS — R634 Abnormal weight loss: Secondary | ICD-10-CM | POA: Diagnosis not present

## 2022-05-24 DIAGNOSIS — C73 Malignant neoplasm of thyroid gland: Secondary | ICD-10-CM | POA: Diagnosis not present

## 2022-05-24 DIAGNOSIS — C7902 Secondary malignant neoplasm of left kidney and renal pelvis: Secondary | ICD-10-CM | POA: Diagnosis not present

## 2022-05-24 DIAGNOSIS — C7802 Secondary malignant neoplasm of left lung: Secondary | ICD-10-CM | POA: Diagnosis not present

## 2022-05-24 DIAGNOSIS — E890 Postprocedural hypothyroidism: Secondary | ICD-10-CM | POA: Diagnosis not present

## 2022-05-27 ENCOUNTER — Ambulatory Visit: Payer: Self-pay | Admitting: *Deleted

## 2022-05-27 DIAGNOSIS — E890 Postprocedural hypothyroidism: Secondary | ICD-10-CM | POA: Diagnosis not present

## 2022-05-27 DIAGNOSIS — C7902 Secondary malignant neoplasm of left kidney and renal pelvis: Secondary | ICD-10-CM | POA: Diagnosis not present

## 2022-05-27 DIAGNOSIS — E43 Unspecified severe protein-calorie malnutrition: Secondary | ICD-10-CM | POA: Diagnosis not present

## 2022-05-27 DIAGNOSIS — R634 Abnormal weight loss: Secondary | ICD-10-CM | POA: Diagnosis not present

## 2022-05-27 DIAGNOSIS — C7802 Secondary malignant neoplasm of left lung: Secondary | ICD-10-CM | POA: Diagnosis not present

## 2022-05-27 DIAGNOSIS — C73 Malignant neoplasm of thyroid gland: Secondary | ICD-10-CM | POA: Diagnosis not present

## 2022-05-27 NOTE — Telephone Encounter (Signed)
  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 States pt calling out, yelling, highly anxious. States has been on commode great deal of the day, 3 loose stools. Hemorrhoid red,, protruding, not bleeding. States pt c/o abdominal pain but not verbalizing where, "Just moaning." States pt unmanageable with increased anxiety. States at least 3 days onset. She is requesting increase of Lorazepam. States pt will max out on pain meds. With further discussion RN states she will alert Hospice physician regarding anxiety and possibility of Hospice Home to manage symptoms. Advised ED for immediate situation with abdominal pain.  Frequency: At least 3 days per hospice RN Pertinent Negatives: Patient denies  Disposition: '[x]'$ ED /'[]'$ Urgent Care (no appt availability in office) / '[]'$ Appointment(In office/virtual)/ '[]'$  [facility]/ '[]'$ Home Care/ '[]'$ Refused Recommended Disposition /'[]'$ [facility]/ '[]'$  Follow-up with PCP Additional Notes: Advised ED, Hospice nurse present states will follow disposition. Reason for Disposition  [1] SEVERE pain AND [2] age > 60 years  Answer Assessment - Initial Assessment Questions 1. LOCATION: "Where does it hurt?"      Rectal pain and abdominal pain,evasive 2. RADIATION: "Does the pain shoot anywhere else?" (e.g., chest, back)     Cannot verbalize 3. ONSET: "When did the pain begin?" (e.g., minutes, hours or days ago)      today 4. SUDDEN: "Gradual or sudden onset?"      5. PATTERN "Does the pain come and go, or is it constant?"    - If it comes and goes: "How long does it last?" "Do you have pain now?"     (Note: Comes and goes means the pain is intermittent. It goes away completely between bouts.)    - If constant: "Is it getting better, staying the same, or getting worse?"      (Note: Constant means the pain never goes away completely; most serious pain is constant and gets worse.)      *No Answer* 6. SEVERITY: "How  bad is the pain?"  (e.g., Scale 1-10; mild, moderate, or severe)    - MILD (1-3): Doesn't interfere with normal activities, abdomen soft and not tender to touch.     - MODERATE (4-7): Interferes with normal activities or awakens from sleep, abdomen tender to touch.     - SEVERE (8-10): Excruciating pain, doubled over, unable to do any normal activities.       *No Answer* 7. RECURRENT SYMPTOM: "Have you ever had this type of stomach pain before?" If Yes, ask: "When was the last time?" and "What happened that time?"      *No Answer* 8. CAUSE: "What do you think is causing the stomach pain?"     *No Answer* 9. RELIEVING/AGGRAVATING FACTORS: "What makes it better or worse?" (e.g., antacids, bending or twisting motion, bowel movement)     *No Answer* 10. OTHER SYMPTOMS: "Do you have any other symptoms?" (e.g., back pain, diarrhea, fever, urination pain, vomiting)     Loose stool x 3 today d?"  Protocols used: Abdominal Pain - Female-A-AH

## 2022-05-31 DIAGNOSIS — C7802 Secondary malignant neoplasm of left lung: Secondary | ICD-10-CM | POA: Diagnosis not present

## 2022-05-31 DIAGNOSIS — E43 Unspecified severe protein-calorie malnutrition: Secondary | ICD-10-CM | POA: Diagnosis not present

## 2022-05-31 DIAGNOSIS — C73 Malignant neoplasm of thyroid gland: Secondary | ICD-10-CM | POA: Diagnosis not present

## 2022-05-31 DIAGNOSIS — R634 Abnormal weight loss: Secondary | ICD-10-CM | POA: Diagnosis not present

## 2022-05-31 DIAGNOSIS — C7902 Secondary malignant neoplasm of left kidney and renal pelvis: Secondary | ICD-10-CM | POA: Diagnosis not present

## 2022-05-31 DIAGNOSIS — E890 Postprocedural hypothyroidism: Secondary | ICD-10-CM | POA: Diagnosis not present

## 2022-06-01 DIAGNOSIS — E43 Unspecified severe protein-calorie malnutrition: Secondary | ICD-10-CM | POA: Diagnosis not present

## 2022-06-01 DIAGNOSIS — C7802 Secondary malignant neoplasm of left lung: Secondary | ICD-10-CM | POA: Diagnosis not present

## 2022-06-01 DIAGNOSIS — E890 Postprocedural hypothyroidism: Secondary | ICD-10-CM | POA: Diagnosis not present

## 2022-06-01 DIAGNOSIS — C7902 Secondary malignant neoplasm of left kidney and renal pelvis: Secondary | ICD-10-CM | POA: Diagnosis not present

## 2022-06-01 DIAGNOSIS — R634 Abnormal weight loss: Secondary | ICD-10-CM | POA: Diagnosis not present

## 2022-06-01 DIAGNOSIS — C73 Malignant neoplasm of thyroid gland: Secondary | ICD-10-CM | POA: Diagnosis not present

## 2022-06-03 DIAGNOSIS — C73 Malignant neoplasm of thyroid gland: Secondary | ICD-10-CM | POA: Diagnosis not present

## 2022-06-03 DIAGNOSIS — R634 Abnormal weight loss: Secondary | ICD-10-CM | POA: Diagnosis not present

## 2022-06-03 DIAGNOSIS — E43 Unspecified severe protein-calorie malnutrition: Secondary | ICD-10-CM | POA: Diagnosis not present

## 2022-06-03 DIAGNOSIS — C7902 Secondary malignant neoplasm of left kidney and renal pelvis: Secondary | ICD-10-CM | POA: Diagnosis not present

## 2022-06-03 DIAGNOSIS — E890 Postprocedural hypothyroidism: Secondary | ICD-10-CM | POA: Diagnosis not present

## 2022-06-03 DIAGNOSIS — C7802 Secondary malignant neoplasm of left lung: Secondary | ICD-10-CM | POA: Diagnosis not present

## 2022-06-04 DIAGNOSIS — C7902 Secondary malignant neoplasm of left kidney and renal pelvis: Secondary | ICD-10-CM | POA: Diagnosis not present

## 2022-06-04 DIAGNOSIS — R634 Abnormal weight loss: Secondary | ICD-10-CM | POA: Diagnosis not present

## 2022-06-04 DIAGNOSIS — E890 Postprocedural hypothyroidism: Secondary | ICD-10-CM | POA: Diagnosis not present

## 2022-06-04 DIAGNOSIS — C7802 Secondary malignant neoplasm of left lung: Secondary | ICD-10-CM | POA: Diagnosis not present

## 2022-06-04 DIAGNOSIS — C73 Malignant neoplasm of thyroid gland: Secondary | ICD-10-CM | POA: Diagnosis not present

## 2022-06-04 DIAGNOSIS — E43 Unspecified severe protein-calorie malnutrition: Secondary | ICD-10-CM | POA: Diagnosis not present

## 2022-06-05 DIAGNOSIS — C7802 Secondary malignant neoplasm of left lung: Secondary | ICD-10-CM | POA: Diagnosis not present

## 2022-06-05 DIAGNOSIS — E890 Postprocedural hypothyroidism: Secondary | ICD-10-CM | POA: Diagnosis not present

## 2022-06-05 DIAGNOSIS — C73 Malignant neoplasm of thyroid gland: Secondary | ICD-10-CM | POA: Diagnosis not present

## 2022-06-05 DIAGNOSIS — C7902 Secondary malignant neoplasm of left kidney and renal pelvis: Secondary | ICD-10-CM | POA: Diagnosis not present

## 2022-06-05 DIAGNOSIS — R634 Abnormal weight loss: Secondary | ICD-10-CM | POA: Diagnosis not present

## 2022-06-05 DIAGNOSIS — E43 Unspecified severe protein-calorie malnutrition: Secondary | ICD-10-CM | POA: Diagnosis not present

## 2022-06-06 DIAGNOSIS — C73 Malignant neoplasm of thyroid gland: Secondary | ICD-10-CM | POA: Diagnosis not present

## 2022-06-06 DIAGNOSIS — C7902 Secondary malignant neoplasm of left kidney and renal pelvis: Secondary | ICD-10-CM | POA: Diagnosis not present

## 2022-06-06 DIAGNOSIS — C7802 Secondary malignant neoplasm of left lung: Secondary | ICD-10-CM | POA: Diagnosis not present

## 2022-06-06 DIAGNOSIS — E890 Postprocedural hypothyroidism: Secondary | ICD-10-CM | POA: Diagnosis not present

## 2022-06-06 DIAGNOSIS — E43 Unspecified severe protein-calorie malnutrition: Secondary | ICD-10-CM | POA: Diagnosis not present

## 2022-06-06 DIAGNOSIS — R634 Abnormal weight loss: Secondary | ICD-10-CM | POA: Diagnosis not present

## 2022-06-07 DIAGNOSIS — C7802 Secondary malignant neoplasm of left lung: Secondary | ICD-10-CM | POA: Diagnosis not present

## 2022-06-07 DIAGNOSIS — R634 Abnormal weight loss: Secondary | ICD-10-CM | POA: Diagnosis not present

## 2022-06-07 DIAGNOSIS — C7902 Secondary malignant neoplasm of left kidney and renal pelvis: Secondary | ICD-10-CM | POA: Diagnosis not present

## 2022-06-07 DIAGNOSIS — C73 Malignant neoplasm of thyroid gland: Secondary | ICD-10-CM | POA: Diagnosis not present

## 2022-06-07 DIAGNOSIS — E43 Unspecified severe protein-calorie malnutrition: Secondary | ICD-10-CM | POA: Diagnosis not present

## 2022-06-07 DIAGNOSIS — E890 Postprocedural hypothyroidism: Secondary | ICD-10-CM | POA: Diagnosis not present

## 2022-06-08 DIAGNOSIS — C7902 Secondary malignant neoplasm of left kidney and renal pelvis: Secondary | ICD-10-CM | POA: Diagnosis not present

## 2022-06-08 DIAGNOSIS — C7802 Secondary malignant neoplasm of left lung: Secondary | ICD-10-CM | POA: Diagnosis not present

## 2022-06-08 DIAGNOSIS — R634 Abnormal weight loss: Secondary | ICD-10-CM | POA: Diagnosis not present

## 2022-06-08 DIAGNOSIS — E890 Postprocedural hypothyroidism: Secondary | ICD-10-CM | POA: Diagnosis not present

## 2022-06-08 DIAGNOSIS — E43 Unspecified severe protein-calorie malnutrition: Secondary | ICD-10-CM | POA: Diagnosis not present

## 2022-06-08 DIAGNOSIS — C73 Malignant neoplasm of thyroid gland: Secondary | ICD-10-CM | POA: Diagnosis not present

## 2022-06-09 ENCOUNTER — Encounter: Payer: Self-pay | Admitting: Family Medicine

## 2022-06-09 DIAGNOSIS — C73 Malignant neoplasm of thyroid gland: Secondary | ICD-10-CM | POA: Diagnosis not present

## 2022-06-09 DIAGNOSIS — C7802 Secondary malignant neoplasm of left lung: Secondary | ICD-10-CM | POA: Diagnosis not present

## 2022-06-09 DIAGNOSIS — R634 Abnormal weight loss: Secondary | ICD-10-CM | POA: Diagnosis not present

## 2022-06-09 DIAGNOSIS — E43 Unspecified severe protein-calorie malnutrition: Secondary | ICD-10-CM | POA: Diagnosis not present

## 2022-06-09 DIAGNOSIS — C7902 Secondary malignant neoplasm of left kidney and renal pelvis: Secondary | ICD-10-CM | POA: Diagnosis not present

## 2022-06-09 DIAGNOSIS — E890 Postprocedural hypothyroidism: Secondary | ICD-10-CM | POA: Diagnosis not present

## 2022-06-09 DIAGNOSIS — Z515 Encounter for palliative care: Secondary | ICD-10-CM | POA: Insufficient documentation

## 2022-06-10 DIAGNOSIS — E890 Postprocedural hypothyroidism: Secondary | ICD-10-CM | POA: Diagnosis not present

## 2022-06-10 DIAGNOSIS — C73 Malignant neoplasm of thyroid gland: Secondary | ICD-10-CM | POA: Diagnosis not present

## 2022-06-10 DIAGNOSIS — R634 Abnormal weight loss: Secondary | ICD-10-CM | POA: Diagnosis not present

## 2022-06-10 DIAGNOSIS — E43 Unspecified severe protein-calorie malnutrition: Secondary | ICD-10-CM | POA: Diagnosis not present

## 2022-06-10 DIAGNOSIS — C7802 Secondary malignant neoplasm of left lung: Secondary | ICD-10-CM | POA: Diagnosis not present

## 2022-06-10 DIAGNOSIS — C7902 Secondary malignant neoplasm of left kidney and renal pelvis: Secondary | ICD-10-CM | POA: Diagnosis not present

## 2022-06-11 DIAGNOSIS — R339 Retention of urine, unspecified: Secondary | ICD-10-CM | POA: Diagnosis not present

## 2022-06-11 DIAGNOSIS — C73 Malignant neoplasm of thyroid gland: Secondary | ICD-10-CM | POA: Diagnosis not present

## 2022-06-11 DIAGNOSIS — E890 Postprocedural hypothyroidism: Secondary | ICD-10-CM | POA: Diagnosis not present

## 2022-06-11 DIAGNOSIS — Z466 Encounter for fitting and adjustment of urinary device: Secondary | ICD-10-CM | POA: Diagnosis not present

## 2022-06-11 DIAGNOSIS — J449 Chronic obstructive pulmonary disease, unspecified: Secondary | ICD-10-CM | POA: Diagnosis not present

## 2022-06-11 DIAGNOSIS — G894 Chronic pain syndrome: Secondary | ICD-10-CM | POA: Diagnosis not present

## 2022-06-11 DIAGNOSIS — M4800 Spinal stenosis, site unspecified: Secondary | ICD-10-CM | POA: Diagnosis not present

## 2022-06-11 DIAGNOSIS — K219 Gastro-esophageal reflux disease without esophagitis: Secondary | ICD-10-CM | POA: Diagnosis not present

## 2022-06-11 DIAGNOSIS — F419 Anxiety disorder, unspecified: Secondary | ICD-10-CM | POA: Diagnosis not present

## 2022-06-11 DIAGNOSIS — R634 Abnormal weight loss: Secondary | ICD-10-CM | POA: Diagnosis not present

## 2022-06-11 DIAGNOSIS — I5030 Unspecified diastolic (congestive) heart failure: Secondary | ICD-10-CM | POA: Diagnosis not present

## 2022-06-11 DIAGNOSIS — I110 Hypertensive heart disease with heart failure: Secondary | ICD-10-CM | POA: Diagnosis not present

## 2022-06-11 DIAGNOSIS — J310 Chronic rhinitis: Secondary | ICD-10-CM | POA: Diagnosis not present

## 2022-06-11 DIAGNOSIS — D500 Iron deficiency anemia secondary to blood loss (chronic): Secondary | ICD-10-CM | POA: Diagnosis not present

## 2022-06-11 DIAGNOSIS — K922 Gastrointestinal hemorrhage, unspecified: Secondary | ICD-10-CM | POA: Diagnosis not present

## 2022-06-11 DIAGNOSIS — M810 Age-related osteoporosis without current pathological fracture: Secondary | ICD-10-CM | POA: Diagnosis not present

## 2022-06-11 DIAGNOSIS — Z681 Body mass index (BMI) 19 or less, adult: Secondary | ICD-10-CM | POA: Diagnosis not present

## 2022-06-11 DIAGNOSIS — C7902 Secondary malignant neoplasm of left kidney and renal pelvis: Secondary | ICD-10-CM | POA: Diagnosis not present

## 2022-06-11 DIAGNOSIS — E43 Unspecified severe protein-calorie malnutrition: Secondary | ICD-10-CM | POA: Diagnosis not present

## 2022-06-11 DIAGNOSIS — E785 Hyperlipidemia, unspecified: Secondary | ICD-10-CM | POA: Diagnosis not present

## 2022-06-11 DIAGNOSIS — C7802 Secondary malignant neoplasm of left lung: Secondary | ICD-10-CM | POA: Diagnosis not present

## 2022-06-11 DIAGNOSIS — H3530 Unspecified macular degeneration: Secondary | ICD-10-CM | POA: Diagnosis not present

## 2022-06-11 DIAGNOSIS — F32A Depression, unspecified: Secondary | ICD-10-CM | POA: Diagnosis not present

## 2022-06-12 DIAGNOSIS — E43 Unspecified severe protein-calorie malnutrition: Secondary | ICD-10-CM | POA: Diagnosis not present

## 2022-06-12 DIAGNOSIS — E890 Postprocedural hypothyroidism: Secondary | ICD-10-CM | POA: Diagnosis not present

## 2022-06-12 DIAGNOSIS — C7902 Secondary malignant neoplasm of left kidney and renal pelvis: Secondary | ICD-10-CM | POA: Diagnosis not present

## 2022-06-12 DIAGNOSIS — C73 Malignant neoplasm of thyroid gland: Secondary | ICD-10-CM | POA: Diagnosis not present

## 2022-06-12 DIAGNOSIS — C7802 Secondary malignant neoplasm of left lung: Secondary | ICD-10-CM | POA: Diagnosis not present

## 2022-06-12 DIAGNOSIS — R634 Abnormal weight loss: Secondary | ICD-10-CM | POA: Diagnosis not present

## 2022-06-13 DIAGNOSIS — E43 Unspecified severe protein-calorie malnutrition: Secondary | ICD-10-CM | POA: Diagnosis not present

## 2022-06-13 DIAGNOSIS — C7802 Secondary malignant neoplasm of left lung: Secondary | ICD-10-CM | POA: Diagnosis not present

## 2022-06-13 DIAGNOSIS — C73 Malignant neoplasm of thyroid gland: Secondary | ICD-10-CM | POA: Diagnosis not present

## 2022-06-13 DIAGNOSIS — C7902 Secondary malignant neoplasm of left kidney and renal pelvis: Secondary | ICD-10-CM | POA: Diagnosis not present

## 2022-06-13 DIAGNOSIS — E890 Postprocedural hypothyroidism: Secondary | ICD-10-CM | POA: Diagnosis not present

## 2022-06-13 DIAGNOSIS — R634 Abnormal weight loss: Secondary | ICD-10-CM | POA: Diagnosis not present

## 2022-06-14 DIAGNOSIS — E890 Postprocedural hypothyroidism: Secondary | ICD-10-CM | POA: Diagnosis not present

## 2022-06-14 DIAGNOSIS — C7902 Secondary malignant neoplasm of left kidney and renal pelvis: Secondary | ICD-10-CM | POA: Diagnosis not present

## 2022-06-14 DIAGNOSIS — C73 Malignant neoplasm of thyroid gland: Secondary | ICD-10-CM | POA: Diagnosis not present

## 2022-06-14 DIAGNOSIS — R634 Abnormal weight loss: Secondary | ICD-10-CM | POA: Diagnosis not present

## 2022-06-14 DIAGNOSIS — E43 Unspecified severe protein-calorie malnutrition: Secondary | ICD-10-CM | POA: Diagnosis not present

## 2022-06-14 DIAGNOSIS — C7802 Secondary malignant neoplasm of left lung: Secondary | ICD-10-CM | POA: Diagnosis not present

## 2022-06-17 ENCOUNTER — Ambulatory Visit: Payer: Medicare Other | Admitting: Physician Assistant

## 2022-07-01 ENCOUNTER — Telehealth: Payer: Self-pay

## 2022-07-01 NOTE — Telephone Encounter (Signed)
Acknowledged. Thank you for this update.  [name], [facility] 07/01/2022, 1:42 PM

## 2022-07-01 NOTE — Telephone Encounter (Signed)
Copied from [place] [id number]. Topic: General - Deceased Patient >> 07-06-22 11:48 AM Ludger Nutting wrote: [name], [name] called and said that patient passed on 19-Jun-2022.

## 2022-07-06 ENCOUNTER — Ambulatory Visit: Payer: Medicare Other

## 2022-07-11 DEATH — deceased
